# Patient Record
Sex: Female | Born: 1935 | Race: White | Hispanic: No | Smoking: Never smoker
Health system: Southern US, Community
[De-identification: ages and names within clinical notes are randomized; demographics above are authoritative.]

## PROBLEM LIST (undated history)

## (undated) DIAGNOSIS — I10 Essential (primary) hypertension: Secondary | ICD-10-CM

## (undated) DIAGNOSIS — H9190 Unspecified hearing loss, unspecified ear: Secondary | ICD-10-CM

## (undated) DIAGNOSIS — Z7901 Long term (current) use of anticoagulants: Secondary | ICD-10-CM

## (undated) DIAGNOSIS — I739 Peripheral vascular disease, unspecified: Secondary | ICD-10-CM

## (undated) DIAGNOSIS — I480 Paroxysmal atrial fibrillation: Secondary | ICD-10-CM

## (undated) DIAGNOSIS — I499 Cardiac arrhythmia, unspecified: Secondary | ICD-10-CM

## (undated) DIAGNOSIS — R42 Dizziness and giddiness: Secondary | ICD-10-CM

## (undated) DIAGNOSIS — I4891 Unspecified atrial fibrillation: Secondary | ICD-10-CM

## (undated) DIAGNOSIS — N189 Chronic kidney disease, unspecified: Secondary | ICD-10-CM

## (undated) DIAGNOSIS — K625 Hemorrhage of anus and rectum: Secondary | ICD-10-CM

## (undated) DIAGNOSIS — J45909 Unspecified asthma, uncomplicated: Secondary | ICD-10-CM

## (undated) DIAGNOSIS — I4819 Other persistent atrial fibrillation: Secondary | ICD-10-CM

## (undated) DIAGNOSIS — I440 Atrioventricular block, first degree: Secondary | ICD-10-CM

## (undated) DIAGNOSIS — E039 Hypothyroidism, unspecified: Secondary | ICD-10-CM

## (undated) DIAGNOSIS — M1990 Unspecified osteoarthritis, unspecified site: Secondary | ICD-10-CM

## (undated) DIAGNOSIS — K648 Other hemorrhoids: Secondary | ICD-10-CM

## (undated) DIAGNOSIS — E079 Disorder of thyroid, unspecified: Secondary | ICD-10-CM

## (undated) HISTORY — DX: Other hemorrhoids: K64.8

## (undated) HISTORY — PX: CYSTOCELE REPAIR: SHX163

## (undated) HISTORY — DX: Essential (primary) hypertension: I10

## (undated) HISTORY — DX: Hemorrhage of anus and rectum: K62.5

## (undated) HISTORY — PX: COLONOSCOPY: SHX174

## (undated) HISTORY — PX: HAND SURGERY: SHX662

## (undated) HISTORY — PX: TONSILLECTOMY: SUR1361

## (undated) HISTORY — DX: Chronic kidney disease, unspecified: N18.9

## (undated) HISTORY — PX: DILATION AND CURETTAGE OF UTERUS: SHX78

## (undated) HISTORY — DX: Paroxysmal atrial fibrillation: I48.0

## (undated) HISTORY — DX: Long term (current) use of anticoagulants: Z79.01

## (undated) HISTORY — DX: Disorder of thyroid, unspecified: E07.9

## (undated) HISTORY — DX: Atrioventricular block, first degree: I44.0

## (undated) HISTORY — DX: Other persistent atrial fibrillation: I48.19

## (undated) HISTORY — PX: JOINT REPLACEMENT: SHX530

## (undated) HISTORY — PX: DENTAL SURGERY: SHX609

## (undated) HISTORY — PX: APPENDECTOMY: SHX54

## (undated) HISTORY — DX: Unspecified atrial fibrillation: I48.91

## (undated) HISTORY — PX: ABDOMINAL HYSTERECTOMY: SHX81

## (undated) HISTORY — DX: Unspecified asthma, uncomplicated: J45.909

## (undated) HISTORY — DX: Dizziness and giddiness: R42

## (undated) HISTORY — PX: BACK SURGERY: SHX140

---

## 1999-07-16 ENCOUNTER — Encounter: Payer: Self-pay | Admitting: Obstetrics and Gynecology

## 1999-07-16 ENCOUNTER — Encounter: Admission: RE | Admit: 1999-07-16 | Discharge: 1999-07-16 | Payer: Self-pay | Admitting: Obstetrics and Gynecology

## 1999-10-09 ENCOUNTER — Other Ambulatory Visit: Admission: RE | Admit: 1999-10-09 | Discharge: 1999-10-09 | Payer: Self-pay | Admitting: Obstetrics and Gynecology

## 2000-07-21 ENCOUNTER — Encounter: Admission: RE | Admit: 2000-07-21 | Discharge: 2000-07-21 | Payer: Self-pay | Admitting: Family Medicine

## 2000-07-21 ENCOUNTER — Encounter: Payer: Self-pay | Admitting: Family Medicine

## 2000-11-08 ENCOUNTER — Other Ambulatory Visit: Admission: RE | Admit: 2000-11-08 | Discharge: 2000-11-08 | Payer: Self-pay | Admitting: Obstetrics and Gynecology

## 2000-12-14 ENCOUNTER — Encounter: Admission: RE | Admit: 2000-12-14 | Discharge: 2000-12-14 | Payer: Self-pay | Admitting: Obstetrics and Gynecology

## 2000-12-14 ENCOUNTER — Encounter: Payer: Self-pay | Admitting: Obstetrics and Gynecology

## 2001-03-02 ENCOUNTER — Ambulatory Visit (HOSPITAL_COMMUNITY): Admission: RE | Admit: 2001-03-02 | Discharge: 2001-03-02 | Payer: Self-pay | Admitting: Gastroenterology

## 2001-07-26 ENCOUNTER — Encounter: Admission: RE | Admit: 2001-07-26 | Discharge: 2001-07-26 | Payer: Self-pay | Admitting: Obstetrics and Gynecology

## 2001-07-26 ENCOUNTER — Encounter: Payer: Self-pay | Admitting: Obstetrics and Gynecology

## 2001-12-20 ENCOUNTER — Other Ambulatory Visit: Admission: RE | Admit: 2001-12-20 | Discharge: 2001-12-20 | Payer: Self-pay | Admitting: Obstetrics and Gynecology

## 2002-08-09 ENCOUNTER — Encounter: Admission: RE | Admit: 2002-08-09 | Discharge: 2002-08-09 | Payer: Self-pay | Admitting: Family Medicine

## 2002-08-09 ENCOUNTER — Encounter: Payer: Self-pay | Admitting: Family Medicine

## 2003-08-13 ENCOUNTER — Encounter: Admission: RE | Admit: 2003-08-13 | Discharge: 2003-08-13 | Payer: Self-pay | Admitting: Obstetrics and Gynecology

## 2003-10-01 ENCOUNTER — Ambulatory Visit: Admission: RE | Admit: 2003-10-01 | Discharge: 2003-10-01 | Payer: Self-pay | Admitting: Family Medicine

## 2004-02-05 ENCOUNTER — Ambulatory Visit (HOSPITAL_COMMUNITY): Admission: RE | Admit: 2004-02-05 | Discharge: 2004-02-05 | Payer: Self-pay | Admitting: Neurosurgery

## 2004-03-21 ENCOUNTER — Ambulatory Visit (HOSPITAL_COMMUNITY): Admission: RE | Admit: 2004-03-21 | Discharge: 2004-03-21 | Payer: Self-pay | Admitting: Neurosurgery

## 2004-04-29 ENCOUNTER — Inpatient Hospital Stay (HOSPITAL_COMMUNITY): Admission: RE | Admit: 2004-04-29 | Discharge: 2004-05-06 | Payer: Self-pay | Admitting: Neurosurgery

## 2004-05-06 ENCOUNTER — Inpatient Hospital Stay
Admission: RE | Admit: 2004-05-06 | Discharge: 2004-05-13 | Payer: Self-pay | Admitting: Physical Medicine & Rehabilitation

## 2004-05-06 ENCOUNTER — Ambulatory Visit: Payer: Self-pay | Admitting: Physical Medicine & Rehabilitation

## 2004-05-06 ENCOUNTER — Encounter (HOSPITAL_COMMUNITY)
Admission: RE | Admit: 2004-05-06 | Discharge: 2004-05-07 | Payer: Self-pay | Admitting: Physical Medicine & Rehabilitation

## 2004-05-08 ENCOUNTER — Ambulatory Visit (HOSPITAL_COMMUNITY)
Admission: RE | Admit: 2004-05-08 | Discharge: 2004-05-08 | Payer: Self-pay | Admitting: Physical Medicine & Rehabilitation

## 2004-07-07 ENCOUNTER — Ambulatory Visit (HOSPITAL_COMMUNITY): Admission: RE | Admit: 2004-07-07 | Discharge: 2004-07-07 | Payer: Self-pay | Admitting: Neurosurgery

## 2004-09-17 ENCOUNTER — Encounter: Admission: RE | Admit: 2004-09-17 | Discharge: 2004-09-17 | Payer: Self-pay | Admitting: Family Medicine

## 2005-11-03 ENCOUNTER — Encounter: Admission: RE | Admit: 2005-11-03 | Discharge: 2005-11-03 | Payer: Self-pay | Admitting: Family Medicine

## 2006-03-09 ENCOUNTER — Encounter: Admission: RE | Admit: 2006-03-09 | Discharge: 2006-03-09 | Payer: Self-pay | Admitting: Family Medicine

## 2006-11-05 ENCOUNTER — Encounter: Admission: RE | Admit: 2006-11-05 | Discharge: 2006-11-05 | Payer: Self-pay | Admitting: Family Medicine

## 2007-04-22 ENCOUNTER — Encounter: Admission: RE | Admit: 2007-04-22 | Discharge: 2007-04-22 | Payer: Self-pay | Admitting: Orthopedic Surgery

## 2007-04-26 ENCOUNTER — Ambulatory Visit (HOSPITAL_BASED_OUTPATIENT_CLINIC_OR_DEPARTMENT_OTHER): Admission: RE | Admit: 2007-04-26 | Discharge: 2007-04-27 | Payer: Self-pay | Admitting: Orthopedic Surgery

## 2007-10-11 ENCOUNTER — Ambulatory Visit: Payer: Self-pay | Admitting: Sports Medicine

## 2007-10-11 ENCOUNTER — Encounter (INDEPENDENT_AMBULATORY_CARE_PROVIDER_SITE_OTHER): Payer: Self-pay

## 2007-10-20 DIAGNOSIS — E669 Obesity, unspecified: Secondary | ICD-10-CM | POA: Insufficient documentation

## 2007-11-01 ENCOUNTER — Ambulatory Visit: Payer: Self-pay | Admitting: Family Medicine

## 2007-11-22 ENCOUNTER — Encounter: Admission: RE | Admit: 2007-11-22 | Discharge: 2007-11-22 | Payer: Self-pay | Admitting: Obstetrics and Gynecology

## 2008-11-23 ENCOUNTER — Encounter: Admission: RE | Admit: 2008-11-23 | Discharge: 2008-11-23 | Payer: Self-pay | Admitting: Obstetrics and Gynecology

## 2009-11-25 ENCOUNTER — Encounter: Admission: RE | Admit: 2009-11-25 | Discharge: 2009-11-25 | Payer: Self-pay | Admitting: Geriatric Medicine

## 2010-11-04 ENCOUNTER — Other Ambulatory Visit: Payer: Self-pay | Admitting: Geriatric Medicine

## 2010-11-04 DIAGNOSIS — Z1231 Encounter for screening mammogram for malignant neoplasm of breast: Secondary | ICD-10-CM

## 2010-11-28 ENCOUNTER — Ambulatory Visit
Admission: RE | Admit: 2010-11-28 | Discharge: 2010-11-28 | Disposition: A | Discharge status: Home / Self Care | Payer: PRIVATE HEALTH INSURANCE | Source: Ambulatory Visit | Attending: Geriatric Medicine | Admitting: Geriatric Medicine

## 2010-11-28 DIAGNOSIS — Z1231 Encounter for screening mammogram for malignant neoplasm of breast: Secondary | ICD-10-CM

## 2011-01-20 NOTE — Op Note (Signed)
NAMEJILDA, Frances [name]           ACCOUNT NO.:  [id number]   MEDICAL RECORD NO.:  [id number]          PATIENT TYPE:  AMB   LOCATION:  DSC                          FACILITY:  [facility]   PHYSICIAN:  Dionne Ano. 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   PROCEDURE:  1. Left thumb carpometacarpal joint arthroplasty (removal of the      trapezium at the basilar thumb joint level) left upper extremity.  2. Left thumb/wrist abductor pollicis longus, digastric portion tendon      transfer to the first metacarpal FCR (flexor carpi radialis) and      back upon itself (Zancolli tendon transfer) left wrist/basilar      thumb region.  3. Abductor pollicis longus one-third proper portion tendon transfer      to the flexor carpi radialis, abductor pollicis longus proper      portion and back upon themselves with multiple figure-of-eight      throws (Weilby tendon transfer) left wrist/basilar thumb region.  4. Abductor pollicis longus tenodesis (shortening of wrist extensor at      the wrist forearm level to prevent dorsolateral escape) left wrist.   SURGEON:  Dionne Ano. Amanda Pea, M.D.   ASSISTANT:  Karie Chimera.   COMPLICATIONS:  None.   ANESTHESIA:  General with preoperative block.   TOURNIQUET TIME:  Less than 2 hours.   ESTIMATED BLOOD LOSS:  Minimal.   INDICATIONS FOR PROCEDURE:  This patient is a very pleasant 77 year old  female who presents with above-mentioned diagnosis.  I have counseled  her in regards to risks and benefits of surgery including risk of  infection, bleeding, anesthesia, damage to normal  structures and failure  of surgery to accomplish its intended goals of relieving symptoms and  restoring function, with this mind, she desires to proceed.  All  questions have been encouraged answered preoperatively.   OPERATIVE PROCEDURE:  The patient seen by myself and anesthesia.  Preoperative antibiotics were given.  Arm was marked, permit signed and  block was given under the direction of Dr. Jean Rosenthal.  Following this she  was taken to the operative suite and underwent smooth induction of  general anesthesia.  She had slightly incomplete block and thus we  supplemented with a general anesthetic.  Following this the patient was  prepped and draped usual sterile fashion, Betadine scrub and paint about  the left upper extremity.  Once this was done, the patient then  underwent a thorough evaluation under anesthesia.  Preoperative  radiographs were checked and the patient had an incision made at the  dorsolateral aspect of the [facility] joint.  Dissection was carried down and  the superficial radial nerve in its branches were identified and  protected.  The radial artery was identified and protected.  I then  entered between the EPB and  APL region.  Capsule was split, Market researcher placed on either side of the trapezium and the trapezium  excision was accomplished followed by FCR tenolysis, tenosynovectomy and  creation of a drill hole dorsal to palmar exiting intra-articularly at  the palmar and BAK ligament.  This was enlarged to the 32 to 35 drill  bit size and exited intra-articularly. This completed the  arthroplasty/trapezium excision portion of the procedure.   Following this, I then performed irrigation and made a counter incision  to harvest the digastric portion of the APL and the one-third proper  portion of the APL.  These tendons were pretreated isolated and once  counterincision was made I dissected down, clipped the tendons  proximally at the musculotendinous junction and  allowed them to retract  distally.  The main portion of the APL was left intact of course.   Following this, I then performed a tendon transfer of the APL digastric  portion against the metacarpal through the drill hole dorsal to palmar  and around the FCR twice followed by routing it through itself at the  base of the metacarpal.  This was inset and secured with 3-0 FiberWire  and completed the Zancolli tendon transfer.   Following this second tendon transfer was accomplished utilizing a one-  third proper portion of the APL tendon.  This was weaved in a figure-of-  eight fashion between the APL proper and the FCR and inset with  FiberWire of a 3-0 variety completing the Weilby tendon transfer.  This  set the thumb in a nice position.   Following this, the patient underwent shortening of the APL proper to  prevent dorsolateral escape.  This was done with FiberWire and with  extensor shortening at the wrist level to prevent dorsolateral escape.   Following this, I then performed irrigation and closure the capsule.  This was imbricated with FiberWire.  Once this was done, I then  performed deflation of the tourniquet.  Irrigation was applied.  Hemostasis was obtained.  The wounds were closed with Prolene.  The  patient tolerated this well and no complicating features.  Following  this we continued postop measures in the recovery room consisting of IV  antibiotics, pain management according to her needs and will monitor  overnight with 23-hour observation.  I have discussed with the patient  these findings and the postop protocol and went over all issues with her  husband of course.  It was a pleasure to see her today.  At time first  postop visit will plan for suture removal and general postop protocol  status post [facility] arthroplasty with double tendon transfer.           ______________________________  Dionne Ano. Everlene Other, M.D.     Nash Mantis  D:  04/26/2007  T:  04/27/2007  Job:   [id number]

## 2011-01-23 NOTE — Op Note (Signed)
NAMEVERDA, MEHTA                     ACCOUNT NO.:  [id number]   MEDICAL RECORD NO.:  [id number]                   PATIENT TYPE:  INP   LOCATION:  3033                                 FACILITY:  [facility]   PHYSICIAN:  Danae Orleans. Venetia Maxon, M.D.               DATE OF BIRTH:  1934-01-17   DATE OF PROCEDURE:  DATE OF DISCHARGE:                                 OPERATIVE REPORT   DATE OF OPERATION:  April 29, 2004.   PREOPERATIVE DIAGNOSES:  Recurrent stenosis, spondylosis, degenerative disk  disease, scoliosis, and radiculopathy, L3-4, L4-5, and L5-6 levels.   POSTOPERATIVE DIAGNOSES:  Recurrent stenosis, spondylosis, degenerative disk  disease, scoliosis, and radiculopathy, L3-4, L4-5, and L5-6 levels.   PROCEDURE:  Redo compressive laminectomy, L3-4, L4-5, and L5-6 with  diskectomy and transverse lumbar interbody fusions, L3-4, L4-5, and L5-6  levels with pedicle screw fixation of L3, 4, 5 and 6 bilaterally with  posterolateral arthrodesis with bone morphogenic protein and auto and  allograft.   SURGEON:  Maeola Harman, MD.   ASSISTANT:  Barbaraann Barthel, MD.   ANESTHESIA:  General endotracheal anesthesia.   ESTIMATED BLOOD LOSS:  1000 mL with 650 mL of blood returned to the patient.   COMPLICATIONS:  None.   DISPOSITION:  Recovery.   INDICATIONS:  Prentiss Hammett is a 77 year old woman, who has had multiple  prior lumbar decompressive surgeries with recurrent, severe, left lower  extremity pain.  She had preoperative imaging including myelogram and post-  myelographic CT and also an MRI of her lumbar spine, which shows a  combination of degenerative disk disease, disk space collapse, a spondylitic  foraminal stenosis, and recurrent disk herniation along with scoliosis at  multiple levels.  It was elected to take her to surgery for decompression  and fusion at these affected levels.   PROCEDURE:  Ms. Hauswirth was brought to the operating room.  Following the  satisfactory  and uncomplicated induction of general endotracheal anesthesia  and the placement of intravenous lines, the patient was placed in a prone  position on the operating table on chest rolls.  Her back was then prepped  and draped in the usual sterile fashion.  Previous incision was reopened,  carried to the lumbodorsal fascia, which was incised with electrocautery.  Subperiosteal dissection was performed exposing the L3, L4, L5, and L6  transverse processes bilaterally, and this was confirmed on intraoperative  plain film x-ray.  Subsequently under loop magnification, the previously  operated levels were reexplored, scar tissue was very carefully dissected  using a variety of curettes, and the facet joint complexes of L4-5 and L5-6  were decompressed and lateral recess of the spinal canal was decompressed  from L3 to the L6 level bilaterally, with decompression of the spinal canal  and neural foramina.  The L5-6 level was entered on the left and using a  D'Errico nerve root retractor to retract the neural element, the interspace  was  incised with a 15 blade and disk material was removed in a piecemeal  fashion.  The end plates were prepared for placement of an interbody graft.  Subsequently, two levels higher were both decompressed.  There was a  significant amount of scar tissue on the left at the L4-5 level with  caudally-migrated fragments of disk material.  These were removed, and it  was not felt to be possible to retract the nerve root adequately on the left  side because of persistent scar tissue, so the right side exposure was  performed and instrumentation was placed from the right.  At the L3-4 level,  decompression was performed bilaterally.  At both of these levels, the disk  spaces were virtually completely collapsed.  Using a disk space spreader on  the opposite side, the end plates were distracted at L3-4 and L4-5 levels,  and the interspaces were decompressed using a variety of  disk space  preparation tools.  The end plates were stripped of residual disk material  down to bone, and cartilaginous tissue was removed down to bone.  Bone  morphogenic protein was then reconstituted, strips were cut, and at each  level a piece of BMP-soaked collagen sponge was inserted into the depth of  the interspace, and then at the L3-4 level a bullet-nosed peak interbody  cage was inserted and counter-sunk appropriately from the left side, and  then on the right side at the L4-5 level, a similarly sized cage was placed  and counter-sunk appropriately.  The outer edge of the cage cracked, but the  cage appeared to be very rigidly in place, and it was felt that this was not  a problem and that it retained its box-like structure and was therefore left  in position.  Morselized bone autograft was placed overlying these implants,  which had been packed with BMP-soaked collagen sponge.  A 10-mm implant was  then placed at the L5-6 level with BMP sponge at the depth of the disk  space, then the BMP sponge packed within the graft and morselized bone  autograft overlying the implant.  These positionings of implants were  performed under fluoroscopic visualization.  Subsequently, pedicle screw  fixation was placed at L3, L4, L5, and L6 bilaterally using 45 x 5.5-mm  screws at each level.  Positioning and trajectory of the screws were  confirmed on AP and lateral fluoroscopy, and a final x-ray demonstrated well-  positioned screws without cutouts, and there did not appear to be any  cutouts within the pedicle screw tracts.  The marrow-rich blood was  aspirated from the pedicle tracts and placed over the allograft substitute,  which was then laid over BMP sponge and placed over the decorticated  transverse processes, L3 through L6 bilaterally, and then remaining  morselized bone autograft was placed overlying this.  There was a small area of dura on the left side at L4-5 where arachnoid was  exposed, but there did  not appear to be any leak of CSF, and it was elected not to repair this.  Valsalva maneuver did not generate any egress of CSF.  The 100-mm rods were  then placed overlying the screw heads and locked into position with torque  wrench in situ.  The self-retaining retractor was removed.  The lumbodorsal  fascia was then closed with #1 Vicryl sutures, the subcutaneous tissues were  reapproximated with 2-0 Vicryl interrupted inverted sutures, and the skin  edges were reapproximated with interrupted 3-0 subcuticular stitch.  The  wound was dressed with Benzoin, Steri-Strips, Telfa gauze, and tape.  The  patient was extubated in the operating room and taken to the recovery room  in a stable, satisfactory condition, having tolerated the operation well.  Counts were correct at the end of the case.                                               Danae Orleans. Venetia Maxon, M.D.    JDS/MEDQ  D:  04/29/2004  T:  04/29/2004  Job:  [id number]

## 2011-01-23 NOTE — Discharge Summary (Signed)
NAMESANDRIA, [name]                     ACCOUNT NO.:  [id number]   MEDICAL RECORD NO.:  [id number]                   PATIENT TYPE:  ORB   LOCATION:  4525                                 FACILITY:  [facility]   PHYSICIAN:  Ranelle Oyster, M.D.             DATE OF BIRTH:  1934-03-07   DATE OF ADMISSION:  05/06/2004  DATE OF DISCHARGE:  05/13/2004                                 DISCHARGE SUMMARY   DISCHARGE DIAGNOSES:  1.  Redo decompressive lumbar laminectomy L3-L6.  2.  Urinary tract infection.  3.  Hypertension.  4.  Hypokalemia, resolved.   HISTORY OF PRESENT ILLNESS:  Frances [name] is a 77 year old female with  history of hypertension, DDD with prior lumbar laminectomy x2 now with  recurrent low back pain and left lower extremity weakness greater than right  lower extremity weakness with radiculopathy secondary to recurrent stenosis,  spondylosis, scoliosis L3-L6.  She elected to undergo redo decompressive  laminectomy L3-L4, L4-L5, L5-L6 with diskectomy and fusion by Dr. Venetia Maxon on  August 23.  Postoperative issues have included problems with BP control as  well as infiltration on left upper extremity, syncopal episode, and  postoperative fevers.  Fevers are currently noted to be resolved.  Edema  left upper extremity improving.  She does continue complaints regarding  constipation and hemorrhoidal flare-up.  Pain control is improving.  [facility] is  consulted for progressive goals.  This patient has mod assist with bed  mobility, min assist for transfers, guard assist 80 feet with rolling  walker.   PAST MEDICAL HISTORY:  1.  Hypertension.  2.  Lumbar laminectomy x2.  3.  Appendectomy.  4.  Hysterectomy.  5.  Cystocele repair.  6.  Right upper extremity DVT.  7.  Bronchitis.  8.  Seasonal allergies.  9.  DOE.  10. Cervical __________.  11. Hemorrhoids.  12. Nose bleeds.  13. End-stage and left thumb DJD.   ALLERGIES:  No known drug allergies.   SOCIAL HISTORY:   Patient is married.  Lives in two-level home with two steps  to entry.  Was independent and active prior to admission.  Does not use any  tobacco or alcohol.   HOSPITAL COURSE:  Frances [name] was admitted to [facility] on May 06, 2004 for  inpatient therapies to consist of PT/OT daily.  Last admission patient  reported problems with frequency, urgency and was noted to have positive UA.  She was started on amoxicillin.  Laboratories done past admission showed H&H  of 10.0/29.3, white count stable at 9.8.  Check ___________.  Sodium 140,  potassium 3.5, chloride 104, CO2 28, BUN 10, creatinine 0.8, glucose 110.  Patient reported problems with diarrhea secondary to amoxicillin.  This was  discontinued and once cultures finalized she was noted to have greater than  100,000 colonies of Citrobacter freundii in her urine.  She was treated with  Macrodantin four days.  Loose stools were sent for check  of C. difficile and  this was negative.  Patient's diarrhea resolved off of amoxicillin.  Patient's back incision was noted to be healing well without any signs or  symptoms of infection or drainage or erythema noted.  Blood pressure  reasonably controlled.  Patient continues with some weakness in left lower  extremity at time on discharge.  During her stay in [facility] patient made good  progress.  Currently, she is at supervision level for bed mobility,  supervision level for transfers, supervision level ambulating greater than  10 feet with rolling walker, able to navigate stairs with min assist.  Progress was limited in part secondary to her dependence on her husband for  assistance with functional activities.  Currently, she is set up for upper  body care, total assist for donning brace, mod assist for low body care.  Further follow-up therapies to include home health, PT/OT by [facility] on May 13, 2004.  Patient is discharged to home.   DISCHARGE MEDICATIONS:  1.  Coated  aspirin 81 mg daily.  2.  Calcium 500 mg daily.  3.  Premarin 0.3 mg daily.  4.  Macrodantin 50 mg q.i.d.  5.  Altace 10 mg daily.  6.  Oxycodone IR 5-10 mg q.4-6h. p.r.n. pain.  7.  Hydrochlorothiazide 25 mg daily.  8.  Tenormin 25 mg q.p.m.   ACTIVITY:  Use corset, back precautions.   WOUND CARE:  Keep the area dry and clean.   SPECIAL INSTRUCTIONS:  No alcohol.  No smoking.  No driving.   FOLLOWUP:  Patient to follow up with Dr. Venetia Maxon for postoperative check.  Follow up with Dr. Andrey Campanile for routine check.  Follow up with Dr. Riley Kill as  needed.      Greg Cutter, P.A.                    Ranelle Oyster, M.D.    PP/MEDQ  D:  05/13/2004  T:  05/14/2004  Job:  [id number]   cc:   Delsa Grana. Andrey Campanile, M.D.  [address]  [place]  [place] [address]  Fax: [phone]   Danae Orleans. Venetia Maxon, M.D.  [address]  [place]  [place] [address]  Fax: [phone]

## 2011-01-23 NOTE — Procedures (Signed)
[facility]  Patient:    Frances [name], Frances [name]                     MRN: [id number]. Date: 03/02/01 Attending:  Anselmo Rod, M.D. CC:         Cordelia Pen A. Rosalio Macadamia, M.D.   Procedure Report  DATE OF BIRTH:  04/30/1934  REFERRING PHYSICIAN:  Cordelia Pen A. Rosalio Macadamia, M.D.  PROCEDURE PERFORMED:  Colonoscopy.  ENDOSCOPIST:  Anselmo Rod, M.D.  INSTRUMENT USED:  Olympus video colonoscope.  INDICATIONS FOR PROCEDURE:  Rectal bleeding in a 77 year old white female rule out colonic polyps, masses, hemorrhoids, etc.  PREPROCEDURE PREPARATION:  Informed consent was procured from the patient. The patient was fasted for eight hours prior to the procedure and prepped with a bottle of magnesium citrate and a gallon of NuLytely the night prior to the procedure.  PREPROCEDURE PHYSICAL:  The patient had stable vital signs.  Neck supple. Chest clear to auscultation.  S1, S2 regular.  Abdomen soft with normal abdominal bowel sounds.  DESCRIPTION OF PROCEDURE:  The patient was placed in the left lateral decubitus position and sedated with 70 mg of Demerol and 7 mg of Versed intravenously.  Once the patient was adequately sedated and maintained on low-flow oxygen and continuous cardiac monitoring, the Olympus video colonoscope was advanced from the rectum to the cecum without difficulty. Except for small nonbleeding internal and external hemorrhoids, no other abnormalities were seen.  IMPRESSION:  Healthy-appearing colon except for small nonbleeding internal and external hemorrhoids.  RECOMMENDATIONS: 1. A high fiber diet has been recommended. 2. She is to return to the office if she has any recurrence of her symptoms    or worsening of her present condition.DD:  03/02/01 TD:  03/02/01 Job: [id number]

## 2011-01-23 NOTE — H&P (Signed)
[place]  Patient:    Frances [name]                    MRN: [id number] Proc. Date: 12/08/96 Adm. Date:  12/08/96 Attending:  Josie Saunders                         History and Physical  REASON FOR ADMISSION:  Herniated lumbar disk.  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 Andrey Campanile with the chief complaint of left leg pain, initially on 08/07/96.  She states that this developed after she was chopping down a brush behind her sons house in September. Following this she developed left calf pain and feels tightness across her foot on the left. She initially had some low back pain but at the time of initial consultation denied any significant low back pain.  She feels that her pain is concentrated in her left hip.  She states that she is weak in her left foot and both she and her husband have noted that when she walks she slaps her foot with walking.  She denies any right lower extremity pain.  Frances [name] has had one prior low back surgery which was performed by Jonny Ruiz L. Rendall, III, M.D. in 1984 and consisted of a total laminectomy at L3 based on review of her x-rays.  She states a disk was removed at that time as well.  She states that she had had left leg pain and low back pain then and that this got better after surgery.  Frances [name] had been taking Percocet, Valium, and prednisone taper x 2.  She states that the prednisone helped while she was taking it but since then has not helped her.  She has been taking Aleve but states she has not had a great deal of relief with this pain medication.  PAST MEDICAL HISTORY:  Significant for cystocele, prior back surgery in 1984, tonsillectomy in 1948, appendectomy in 1952, dental surgery in 1969 and 1970, D & C x 3, hysterectomy in 1977.  ALLERGIES:  Allergic to sulfa and  codeine.  SOCIAL HISTORY:  She is a nonsmoker, nondrinker.  She is 5 feet 4 inches tall, 148 pounds.  CURRENT MEDICATIONS: 1.  Premarin 0.625 mg q.d. 2.  Percocet up to t.i.d. p.r.n. pain. 3.  Valium p.r.n. spasms.  FAMILY HISTORY:  Her mother died at age 62 of heart attack, father died at age 16 of diabetes and stroke.  There is a family history of heart attack, diabetes, high blood pressure, and lumbar disk disease.  DIAGNOSTIC STUDIES:  Frances [name] presents with a lumbar MRI which showed six lumbar vertebral bodies, prior decompression at L3-4 without evidence of central canal or lateral recess stenosis.  There is also degenerative disc disease at L4-5 with left-sided disk protrusion.  This was felt on interpretation to represent some epidural fibrosis in the left lateral recess just behind the superior end plate of L5 which exerts mass effect on the vertebral sack.   Of note, this is a previously unoperated level.  There is also central to slightly left-sided shallow disk herniation at the L5-L6 level which showed noncompressed left-sided nerve root.  PHYSICAL EXAMINATION:  Frances [name] is a pleasant cooperative middle-aged woman in moderate discomfort.  HEENT:  No masses.  NECK:  No carotid bruits.  LUNGS:  Clear  to auscultation.  HEART:  Regular rate and rhythm without murmur.  BREASTS:  She had a recent mammogram in September and a breast examination by her family physician.  ABDOMEN:  Soft, nontender, normal active bowel sounds.  No hepatosplenomegaly appreciated.  EXTREMITIES:  She has some swelling in her right arm and had noted some discoloration on her right arm over a week prior to seeing me.  She states this has been gradually improving.  She has not noted any swelling in her left arm.  She is not having any pain or weakness in her arm.  No edema, clubbing or cyanosis.  Intact pedal pulses.  NEUROLOGIC:  Frances [name] walks an ataxic gait favoring her  left lower extremity.  She is able to stand on her toes but is not able to stand on her heel on the left.  She has left sciatic discomfort to palpation and does not have significant midline back pain to palpation.  She is able to bend to within five inches of the floor.  Her lower extremity strength is full in all motor groups bilaterally and symmetrically with the exception of left dorsiflexion which is 4/5 and left extensor hallus longus at 4-/5. Reflexes are 2 at the knees, 2 at the right ankle, 1 at the left ankle.  Great toes are downgoing to flexor stimulation.  She notes that Dr. Priscille Kluver told her before surgery that she had decreased left ankle jerk and does not feel this has changed.  On sensory examination, she has hypesthesia in L5 and S1 distribution on the left.  Straight leg raising is positive on the left with a positive  ______ Spurling sign, minimally positive Patricks test.  Straight leg raising negative on the right.  ADMISSION IMPRESSION:  Frances [name] has evidence of L5 radiculopathy with six lumbar vertebra with evidence on admission studies of L4-5 disk herniation on the left which I think is compressing the left L5 nerve root.  She has positive sciatica discomfort, positive straight leg raising, weakness in dorsiflexion and extensor hallucis longus, and numbness in her left foot.  She has not had any relief despite medications and waiting several months.  The patient subsequently underwent work up of her right arm swelling and this included a venous Doppler of the right upper extremity which showed an extensive deep venous thrombosis in the right upper extremity.  She was admitted to [facility] and started on heparin and was then started on Coumadin.  She was felt by Dr. Edilia Bo to need at least two months of Coumadin. Because of that, her initially planned surgery was cancelled.  She subsequently returned in January still complaining of left  leg pain and weakness along with weakness in dorsiflexion and extensor hallucis longus.  She has been limiting her activities a great  deal while on Coumadin.  Her leg strength is marginally improved. She was noted at that time to have some pitting edema just below her knee from the left and to her mid calf on the right.  She was advised to see Dr. Andrey Campanile for this to have this evaluated.  She subsequently returned on 10/31/96 with swelling in her legs that had improved.  She was placed on a no-added salt diet.  She had a Doppler of her upper extremity and lower extremity and these were both improved.  Her leg pain was somewhat improved at that time but again she states that she is doing no activities and she continues to have significant dorsiflexion  and extensor hallucis longus weakness on the left.  She continues to slap her foot while she walks.  She subsequently returned to see me on 11/27/96.  She was off her Coumadin and continued to have significant weakness in an L5 distribution on the left.  I reviewed her MRI with her which showed the suggestion of a free fragment disk herniation at the L4-5 level.  Of note, she has lumbar vertebra and what appears to be a shallow disk herniation at the L5-6 level which does not appear to be compressing a nerve root.  She gets pain with any activity and also continued weakness of the left leg.  Because of this, it was planned to go ahead with surgery.  Surgery is scheduled for L4-5 microdiskectomy, removal of possible free fragments, and decompression of the nerve root and foraminotomy on the left.  The patient is aware of the potential risks of surgery and wishes to proceed.   PLAN: DD:  12/08/96 TD:  12/09/96 Job: [id number]

## 2011-01-23 NOTE — Discharge Summary (Signed)
[name], [name]           ACCOUNT NO.:  [id number]   MEDICAL RECORD NO.:  [id number]          PATIENT TYPE:  INP   LOCATION:  3033                         FACILITY:  [facility]   PHYSICIAN:  Danae Orleans. Venetia Maxon, M.D.  DATE OF BIRTH:  [birth year]   DATE OF ADMISSION:  04/29/2004  DATE OF DISCHARGE:  05/06/2004                                 DISCHARGE SUMMARY   REASON FOR ADMISSION:  1.  Lumbosacral spondylosis.  2.  Lumbar disk herniation.  3.  Lumbar scoliosis.   ADDITIONAL DIAGNOSES:  1.  Urinary retention.  2.  Hypertension.   FINAL DIAGNOSES:  1.  Lumbosacral spondylosis.  2.  Lumbar disk herniation.  3.  Lumbar scoliosis.  4.  Urinary retention.  5.  Hypertension.   HISTORY OF ILLNESS AND HOSPITAL COURSE:  Frances [name] is a 76 year old  woman with hypertension and lumbar scoliosis and recurrent stenosis who  previously had undergone decompressive lumbar laminectomy for spinal  stenosis who developed recurrent stenosis and scoliotic curvature with  significant nerve root compression at multiple levels in the lumbar spine.  It was elected to admit her to the hospital on a same day's procedure basis  and for her to undergo redo disk decompression diskectomy and fusion, L3  through L6 levels. Postoperatively, she had some burning dysesthesias in her  left leg which gradually resolved. She had a temperature to 103 on the  second postoperative day and was required to engage in deep breathing using  an incentive spirometry. Electrolytes were checked. The patient had a  syncopal episode later that same day. An IV was infiltrated, and this was  discontinued. The patient was gradually mobilized. She had urinary retention  on August 25 and required indwelling Foley catheter for that. She was  gradually immobilized on August 29. Foley catheter was discontinued. She was  seen by the inpatient rehabilitation service and was felt at that point that  it would be a good idea for her  to go for inpatient rehabilitation following  her lumbar decompression and fusion. Instructions were to follow up with Dr.  Venetia Maxon in the office in 3 week with lumbar spine AP and lateral radiographs.      Jose   JDS/MEDQ  D:  06/12/2004  T:  06/12/2004  Job:  [id number]

## 2011-04-07 ENCOUNTER — Encounter (INDEPENDENT_AMBULATORY_CARE_PROVIDER_SITE_OTHER): Payer: Self-pay | Admitting: General Surgery

## 2011-04-08 HISTORY — PX: OTHER SURGICAL HISTORY: SHX169

## 2011-04-13 ENCOUNTER — Encounter (INDEPENDENT_AMBULATORY_CARE_PROVIDER_SITE_OTHER): Payer: Self-pay | Admitting: General Surgery

## 2011-04-13 ENCOUNTER — Ambulatory Visit (INDEPENDENT_AMBULATORY_CARE_PROVIDER_SITE_OTHER): Payer: Medicare Other | Admitting: General Surgery

## 2011-04-13 VITALS — BP 150/80 | HR 64 | Temp 97.3°F | Ht 65.0 in | Wt 173.6 lb

## 2011-04-13 DIAGNOSIS — K648 Other hemorrhoids: Secondary | ICD-10-CM

## 2011-04-13 NOTE — Progress Notes (Signed)
Subjective:     Patient ID: Frances [name], female   DOB: 22-Jun-1934, 77 y.o.   MRN: [id number]  HPI This is a pleasant 77 year old Caucasian female, centimeters to Dr. Carman Ching for evaluation of bleeding hemorrhoids.  The patient has had problems with intermittent bleeding from hemorrhoids for about a year. This is painless. She is placed on Coumadin about a year ago for chronic atrial fibrillation and bleeding episodes became more frequent. She saw Dr. Randa Evens for a colonoscopy which  was negative except for the internal hemorrhoids. He placed her on MiraLax and fiber supplements to keep her stools soft and she actually is having less frequent episodes of bleeding now. Again there is no pain.  She had hemorrhoid banding by Dr. Kendrick Ranch many years ago but otherwise no rectal surgery  Past Medical History  Diagnosis Date  . Hypertension   . Thyroid disease     hypothyroidism  . Atrial fibrillation   . Chronic kidney disease     stage 3 renal disease  . Internal hemorrhoid   . Rectal bleeding    Current Outpatient Prescriptions  Medication Sig Dispense Refill  . Calcium Carbonate-Vitamin D (CALCIUM-VITAMIN D) 500-200 MG-UNIT per tablet Take 1 tablet by mouth 2 (two) times daily with a meal.        . diltiazem (CARDIZEM CD) 120 MG 24 hr capsule Take 120 mg by mouth daily.        Marland Kitchen estradiol (VIVELLE-DOT) 0.05 MG/24HR Place 1 patch onto the skin once a week.        . fish oil-omega-3 fatty acids 1000 MG capsule Take 2 g by mouth daily.        . flecainide (TAMBOCOR) 100 MG tablet Take 100 mg by mouth 2 (two) times daily.        . hydrochlorothiazide 25 MG tablet Take 25 mg by mouth daily.        Marland Kitchen levothyroxine (SYNTHROID, LEVOTHROID) 75 MCG tablet Take 75 mcg by mouth daily.        . Multiple Vitamin (MULTIVITAMIN) tablet Take 1 tablet by mouth daily.        . Probiotic Product (PROBIOTIC PO) Take by mouth daily.        . ramipril (ALTACE) 10 MG tablet Take 10 mg by mouth  daily.        . vitamin C (ASCORBIC ACID) 500 MG tablet Take 500 mg by mouth daily.        Marland Kitchen warfarin (COUMADIN) 3 MG tablet Take 3 mg by mouth daily.         Allergies  Allergen Reactions  . Codeine Nausea And Vomiting  . Keflex Anxiety  . Sudafed (Pseudoephedrine Hcl) Anxiety  . Sulfa Antibiotics Rash    Family History  Problem Relation Age of Onset  . Heart disease Mother   . Hypertension Father   . Diabetes Father   . Heart disease Sister   . Hypertension Sister   . Heart disease Brother     History  Substance Use Topics  . Smoking status: Never Smoker   . Smokeless tobacco: Not on file  . Alcohol Use: No   . Review of Systems  Constitutional: Negative.   HENT: Negative.   Eyes: Negative.   Respiratory: Negative.   Cardiovascular: Positive for palpitations. Negative for chest pain and leg swelling.  Gastrointestinal: Positive for anal bleeding. Negative for nausea, vomiting, abdominal pain, diarrhea, constipation, blood in stool, abdominal distention and rectal pain.  Genitourinary:  Negative.   Skin: Negative.   Neurological: Negative.   Hematological: Negative.   Psychiatric/Behavioral: Negative.        Objective:   Physical Exam  Constitutional: She appears well-developed and well-nourished. No distress.  HENT:  Head: Normocephalic and atraumatic.  Eyes: Conjunctivae are normal. Pupils are equal, round, and reactive to light. No scleral icterus.  Neck: Normal range of motion. Neck supple. No JVD present. No tracheal deviation present. No thyromegaly present.  Cardiovascular: Normal rate and normal heart sounds.  Exam reveals no gallop.   No murmur heard.      Irreg. irreg.  Pulmonary/Chest: Effort normal and breath sounds normal. No respiratory distress. She has no wheezes. She has no rales. She exhibits no tenderness.  Abdominal: Bowel sounds are normal. She exhibits no distension and no mass. There is no tenderness. There is no rebound and no guarding.    Genitourinary:     Musculoskeletal: She exhibits no edema and no tenderness.  Lymphadenopathy:    She has no cervical adenopathy.  Neurological: She exhibits normal muscle tone. Coordination normal.  Skin: Skin is warm and dry. No rash noted. She is not diaphoretic. No erythema. No pallor.  Psychiatric: She has a normal mood and affect. Her behavior is normal. Judgment and thought content normal.       Assessment:     Bleeding internal hemorrhoids, circumferential.  Chronic atrial fibrillation on Coumadin.  Hypertension.  Hypothyroidism.  Status post multiple back surgeries.  Status post hysterectomy and appendectomy.    Plan:     Internal hemorrhoids were injected with sclerosing solution right anterior, right posterior, and left lateral in the office today. She tolerated this very well.  Hydration, high fiber low fat diet encouraged.  Return to see me if there are any further symptoms.

## 2011-04-13 NOTE — Patient Instructions (Signed)
You had been bleeding from internal hemorrhoids. This has been made a little bit worse because you are on Coumadin. Today in my office we injected sclerosing solution and that should control the bleeding. If it does not control the bleeding after 2-3 weeks you should return to see me for a reevaluation. Stay well hydrated. Eat 7-8 servings of fruits and vegetables per day. Take supplemental fiber as necessary.

## 2011-04-13 NOTE — Progress Notes (Deleted)
Subjective:     Patient ID: Frances [name], female   DOB: 04-16-1934, 77 y.o.   MRN: [id number]  HPI Patient underwent a lap-assisted right colectomy on March 05, 2011. She did well from an abdominal standpoint having an ileus for a couple of days. She developed renal insufficiency, was seen by nephrology, urology and also cardiology. She had a Foley catheter placed because she had a bladder outlet obstruction and that returned her renal function is normal. She still has a Foley catheter and is being followed by Dr. Heloise Purpura for her bladder outlet instruction as well as her right renal mass.  In terms of her bowel surgery she understands that she had an adenocarcinoma of the right colon was 0/15 lymph nodes positive. This was a stage TIII, N0 adenocarcinoma. She has not yet been referred to medical oncology.  Her appetite is normal. Her bowel function is normal. She has no bowel pain.  Review of Systems     Objective:   Physical Exam Patient looks well. Her husband is with her.  Weight is 204.8 pounds.  Lungs are clear to auscultation.  Abdomen soft and nontender. All the incisions are well-healed. No mass no distention. Foley catheter in place.    Assessment:     Adenocarcinoma of the acscending colon, pathologic stage TIII, N0. Recovering without any major intra-abdominal complications following laparoscopic-assisted right colectomy.  Acute renal insufficiency secondary to bladder with obstruction, now resolved with indwelling Foley.  Right renal mass, followed by Dr. Laverle Patter.  Hypertension.  Right ovarian cyst of low malignant potential.   Plan:     Keep an appointment with Dr. Laverle Patter regarding Foley catheter removal.  Referred to medical oncology for consultation.  Diet and activities discussed. She was encouraged to increase activity.  Return to see me in 6 weeks.

## 2011-05-06 ENCOUNTER — Telehealth (INDEPENDENT_AMBULATORY_CARE_PROVIDER_SITE_OTHER): Payer: Self-pay

## 2011-05-06 NOTE — Telephone Encounter (Signed)
Pt called and lmovm [id number] having slight pink to red at times bleeding from rectum. She stated this had improved from early August appt injection therapy but wanted to know if she needed to come in now or wait to see if improving. Pt to call back to make appt.

## 2011-06-15 ENCOUNTER — Encounter (INDEPENDENT_AMBULATORY_CARE_PROVIDER_SITE_OTHER): Payer: Self-pay | Admitting: General Surgery

## 2011-06-15 ENCOUNTER — Ambulatory Visit (INDEPENDENT_AMBULATORY_CARE_PROVIDER_SITE_OTHER): Payer: Medicare Other | Admitting: General Surgery

## 2011-06-15 VITALS — BP 132/86 | HR 72 | Temp 98.2°F | Resp 16 | Ht 65.0 in | Wt 173.0 lb

## 2011-06-15 DIAGNOSIS — K648 Other hemorrhoids: Secondary | ICD-10-CM

## 2011-06-15 NOTE — Patient Instructions (Signed)
We injected sclerosing solution into 3 areas of your internal hemorrhoids today. Hopefully this will take care of the problem. You also have a dermatitis of the perianal skin called pruritus ani.. This is due to moisture. We will give you an information booklet that should help resolve this problem. Return to see me if there are any further problems.

## 2011-06-15 NOTE — Progress Notes (Signed)
Subjective:     Patient ID: Frances [name], female   DOB: [birth year], 77 y.o.   MRN: [id number]  HPI This is a very pleasant 77 year old Caucasian female on Coumadin for atrial fibrillation. She returns in followup for management bleeding internal hemorrhoids.  On August 6 I injected the circumferential internal hemorrhoids. She says that the bleeding is 90% better but she still sees some blood and wanted to be checked. She doesn't have any pain. She has some external hemorrhoids but they are basically asymptomatic.  Past Medical History  Diagnosis Date  . Hypertension   . Thyroid disease     hypothyroidism  . Atrial fibrillation   . Chronic kidney disease     stage 3 renal disease  . Internal hemorrhoid   . Rectal bleeding   . Asthmatic bronchitis    Current Outpatient Prescriptions  Medication Sig Dispense Refill  . Calcium Carbonate-Vitamin D (CALCIUM-VITAMIN D) 500-200 MG-UNIT per tablet Take 1 tablet by mouth 2 (two) times daily with a meal.        . diltiazem (CARDIZEM CD) 120 MG 24 hr capsule Take 120 mg by mouth daily.        Marland Kitchen estradiol (VIVELLE-DOT) 0.05 MG/24HR Place 1 patch onto the skin once a week.        . fish oil-omega-3 fatty acids 1000 MG capsule Take 2 g by mouth daily.        . flecainide (TAMBOCOR) 100 MG tablet Take 100 mg by mouth 2 (two) times daily.        . hydrochlorothiazide 25 MG tablet Take 25 mg by mouth daily.        Marland Kitchen levothyroxine (SYNTHROID, LEVOTHROID) 75 MCG tablet Take 75 mcg by mouth daily.        . Multiple Vitamin (MULTIVITAMIN) tablet Take 1 tablet by mouth daily.        . Probiotic Product (PROBIOTIC PO) Take by mouth daily.        . Psyllium (METAMUCIL PO) Take by mouth daily.        . ramipril (ALTACE) 10 MG tablet Take 10 mg by mouth daily.        . vitamin C (ASCORBIC ACID) 500 MG tablet Take 500 mg by mouth daily.        Marland Kitchen warfarin (COUMADIN) 3 MG tablet Take 3 mg by mouth daily.          Allergies  Allergen Reactions  .  Codeine Nausea And Vomiting  . Keflex Anxiety  . Sudafed (Pseudoephedrine Hcl) Anxiety  . Sulfa Antibiotics Rash     Review of Systems     Objective:   Physical Exam Gen. the patient is in good spirits and in no distress.  Rectal she has some small external hemorrhoids which are not inflamed and nontender. 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 Digital rectal exam reveals normal sphincter, not really tender no blood. Anoscopy reveals no blood. She does still have some internal hemorrhoids. I injected with sclerosing solution and right anterior, right posterior, and left lateral patient tolerated this very well without any bleeding.    Assessment:      Bleeding internal hemorrhoids, reinjected today. She has had a good result from her initial injection.  A symptomatic external hemorrhoids  Pruritis ani     .Chronic Coumadin therapy.    Plan:     She we discussed management of internal hemorrhoids as well as external  hemorrhoids.  We also discussed the  management of pruritus ani now. She was given patient information booklet.  Return to see me p.r.n.

## 2011-06-19 LAB — BASIC METABOLIC PANEL
CO2: 28
Creatinine, Ser: 0.81
GFR calc Af Amer: >60
GFR calc non Af Amer: >60
Glucose, Bld: 87
Potassium: 4.4
Sodium: 138

## 2011-06-19 LAB — POCT HEMOGLOBIN-HEMACUE: Operator id: 112821

## 2011-09-16 DIAGNOSIS — H9209 Otalgia, unspecified ear: Secondary | ICD-10-CM | POA: Diagnosis not present

## 2011-09-16 DIAGNOSIS — I4891 Unspecified atrial fibrillation: Secondary | ICD-10-CM | POA: Diagnosis not present

## 2011-10-07 DIAGNOSIS — I4891 Unspecified atrial fibrillation: Secondary | ICD-10-CM | POA: Diagnosis not present

## 2011-10-07 DIAGNOSIS — Z7901 Long term (current) use of anticoagulants: Secondary | ICD-10-CM | POA: Diagnosis not present

## 2011-10-21 ENCOUNTER — Other Ambulatory Visit: Payer: Self-pay | Admitting: Geriatric Medicine

## 2011-10-21 DIAGNOSIS — Z1231 Encounter for screening mammogram for malignant neoplasm of breast: Secondary | ICD-10-CM

## 2011-10-21 DIAGNOSIS — I4891 Unspecified atrial fibrillation: Secondary | ICD-10-CM | POA: Diagnosis not present

## 2011-10-21 DIAGNOSIS — Z7901 Long term (current) use of anticoagulants: Secondary | ICD-10-CM | POA: Diagnosis not present

## 2011-11-10 DIAGNOSIS — I4891 Unspecified atrial fibrillation: Secondary | ICD-10-CM | POA: Diagnosis not present

## 2011-11-24 DIAGNOSIS — N951 Menopausal and female climacteric states: Secondary | ICD-10-CM | POA: Diagnosis not present

## 2011-11-24 DIAGNOSIS — Z01419 Encounter for gynecological examination (general) (routine) without abnormal findings: Secondary | ICD-10-CM | POA: Diagnosis not present

## 2011-11-24 DIAGNOSIS — Z124 Encounter for screening for malignant neoplasm of cervix: Secondary | ICD-10-CM | POA: Diagnosis not present

## 2011-11-25 DIAGNOSIS — I4891 Unspecified atrial fibrillation: Secondary | ICD-10-CM | POA: Diagnosis not present

## 2011-11-30 ENCOUNTER — Ambulatory Visit: Payer: Medicare Other

## 2011-12-02 DIAGNOSIS — I10 Essential (primary) hypertension: Secondary | ICD-10-CM | POA: Diagnosis not present

## 2011-12-02 DIAGNOSIS — I4891 Unspecified atrial fibrillation: Secondary | ICD-10-CM | POA: Diagnosis not present

## 2011-12-03 ENCOUNTER — Ambulatory Visit
Admission: RE | Admit: 2011-12-03 | Discharge: 2011-12-03 | Disposition: A | Discharge status: Home / Self Care | Payer: Medicare Other | Source: Ambulatory Visit | Attending: Geriatric Medicine | Admitting: Geriatric Medicine

## 2011-12-03 DIAGNOSIS — Z1231 Encounter for screening mammogram for malignant neoplasm of breast: Secondary | ICD-10-CM

## 2011-12-07 DIAGNOSIS — I129 Hypertensive chronic kidney disease with stage 1 through stage 4 chronic kidney disease, or unspecified chronic kidney disease: Secondary | ICD-10-CM | POA: Diagnosis not present

## 2011-12-07 DIAGNOSIS — N183 Chronic kidney disease, stage 3 unspecified: Secondary | ICD-10-CM | POA: Diagnosis not present

## 2011-12-07 DIAGNOSIS — Z79899 Other long term (current) drug therapy: Secondary | ICD-10-CM | POA: Diagnosis not present

## 2011-12-07 DIAGNOSIS — E039 Hypothyroidism, unspecified: Secondary | ICD-10-CM | POA: Diagnosis not present

## 2011-12-23 DIAGNOSIS — Z79899 Other long term (current) drug therapy: Secondary | ICD-10-CM | POA: Diagnosis not present

## 2011-12-23 DIAGNOSIS — N183 Chronic kidney disease, stage 3 unspecified: Secondary | ICD-10-CM | POA: Diagnosis not present

## 2011-12-23 DIAGNOSIS — I4891 Unspecified atrial fibrillation: Secondary | ICD-10-CM | POA: Diagnosis not present

## 2011-12-23 DIAGNOSIS — I129 Hypertensive chronic kidney disease with stage 1 through stage 4 chronic kidney disease, or unspecified chronic kidney disease: Secondary | ICD-10-CM | POA: Diagnosis not present

## 2011-12-23 DIAGNOSIS — E039 Hypothyroidism, unspecified: Secondary | ICD-10-CM | POA: Diagnosis not present

## 2012-01-20 DIAGNOSIS — M899 Disorder of bone, unspecified: Secondary | ICD-10-CM | POA: Diagnosis not present

## 2012-01-20 DIAGNOSIS — I4891 Unspecified atrial fibrillation: Secondary | ICD-10-CM | POA: Diagnosis not present

## 2012-01-20 DIAGNOSIS — M949 Disorder of cartilage, unspecified: Secondary | ICD-10-CM | POA: Diagnosis not present

## 2012-01-26 DIAGNOSIS — L820 Inflamed seborrheic keratosis: Secondary | ICD-10-CM | POA: Diagnosis not present

## 2012-01-26 DIAGNOSIS — L570 Actinic keratosis: Secondary | ICD-10-CM | POA: Diagnosis not present

## 2012-02-03 DIAGNOSIS — I4891 Unspecified atrial fibrillation: Secondary | ICD-10-CM | POA: Diagnosis not present

## 2012-02-17 DIAGNOSIS — I4891 Unspecified atrial fibrillation: Secondary | ICD-10-CM | POA: Diagnosis not present

## 2012-02-22 DIAGNOSIS — H6980 Other specified disorders of Eustachian tube, unspecified ear: Secondary | ICD-10-CM | POA: Diagnosis not present

## 2012-02-22 DIAGNOSIS — H66009 Acute suppurative otitis media without spontaneous rupture of ear drum, unspecified ear: Secondary | ICD-10-CM | POA: Diagnosis not present

## 2012-03-16 DIAGNOSIS — I4891 Unspecified atrial fibrillation: Secondary | ICD-10-CM | POA: Diagnosis not present

## 2012-04-13 DIAGNOSIS — I4891 Unspecified atrial fibrillation: Secondary | ICD-10-CM | POA: Diagnosis not present

## 2012-05-10 DIAGNOSIS — I4891 Unspecified atrial fibrillation: Secondary | ICD-10-CM | POA: Diagnosis not present

## 2012-05-11 DIAGNOSIS — H04129 Dry eye syndrome of unspecified lacrimal gland: Secondary | ICD-10-CM | POA: Diagnosis not present

## 2012-05-16 DIAGNOSIS — Z23 Encounter for immunization: Secondary | ICD-10-CM | POA: Diagnosis not present

## 2012-05-30 DIAGNOSIS — H905 Unspecified sensorineural hearing loss: Secondary | ICD-10-CM | POA: Diagnosis not present

## 2012-05-30 DIAGNOSIS — H6980 Other specified disorders of Eustachian tube, unspecified ear: Secondary | ICD-10-CM | POA: Diagnosis not present

## 2012-05-31 DIAGNOSIS — I4891 Unspecified atrial fibrillation: Secondary | ICD-10-CM | POA: Diagnosis not present

## 2012-06-03 DIAGNOSIS — I4891 Unspecified atrial fibrillation: Secondary | ICD-10-CM | POA: Diagnosis not present

## 2012-06-03 DIAGNOSIS — Z7901 Long term (current) use of anticoagulants: Secondary | ICD-10-CM | POA: Diagnosis not present

## 2012-06-03 DIAGNOSIS — R42 Dizziness and giddiness: Secondary | ICD-10-CM | POA: Diagnosis not present

## 2012-06-03 DIAGNOSIS — I10 Essential (primary) hypertension: Secondary | ICD-10-CM | POA: Diagnosis not present

## 2012-06-06 DIAGNOSIS — I4891 Unspecified atrial fibrillation: Secondary | ICD-10-CM | POA: Diagnosis not present

## 2012-06-06 DIAGNOSIS — I10 Essential (primary) hypertension: Secondary | ICD-10-CM | POA: Diagnosis not present

## 2012-06-06 DIAGNOSIS — Z7901 Long term (current) use of anticoagulants: Secondary | ICD-10-CM | POA: Diagnosis not present

## 2012-06-08 DIAGNOSIS — Z0000 Encounter for general adult medical examination without abnormal findings: Secondary | ICD-10-CM | POA: Diagnosis not present

## 2012-06-08 DIAGNOSIS — Z79899 Other long term (current) drug therapy: Secondary | ICD-10-CM | POA: Diagnosis not present

## 2012-06-08 DIAGNOSIS — N183 Chronic kidney disease, stage 3 unspecified: Secondary | ICD-10-CM | POA: Diagnosis not present

## 2012-06-08 DIAGNOSIS — I129 Hypertensive chronic kidney disease with stage 1 through stage 4 chronic kidney disease, or unspecified chronic kidney disease: Secondary | ICD-10-CM | POA: Diagnosis not present

## 2012-06-08 DIAGNOSIS — Z1331 Encounter for screening for depression: Secondary | ICD-10-CM | POA: Diagnosis not present

## 2012-06-09 DIAGNOSIS — R42 Dizziness and giddiness: Secondary | ICD-10-CM | POA: Diagnosis not present

## 2012-06-10 DIAGNOSIS — R42 Dizziness and giddiness: Secondary | ICD-10-CM | POA: Diagnosis not present

## 2012-06-20 DIAGNOSIS — H903 Sensorineural hearing loss, bilateral: Secondary | ICD-10-CM | POA: Diagnosis not present

## 2012-06-20 DIAGNOSIS — H60399 Other infective otitis externa, unspecified ear: Secondary | ICD-10-CM | POA: Diagnosis not present

## 2012-06-28 DIAGNOSIS — Z79899 Other long term (current) drug therapy: Secondary | ICD-10-CM | POA: Diagnosis not present

## 2012-06-28 DIAGNOSIS — I4891 Unspecified atrial fibrillation: Secondary | ICD-10-CM | POA: Diagnosis not present

## 2012-06-28 DIAGNOSIS — I129 Hypertensive chronic kidney disease with stage 1 through stage 4 chronic kidney disease, or unspecified chronic kidney disease: Secondary | ICD-10-CM | POA: Diagnosis not present

## 2012-07-12 DIAGNOSIS — I4891 Unspecified atrial fibrillation: Secondary | ICD-10-CM | POA: Diagnosis not present

## 2012-08-02 DIAGNOSIS — I4891 Unspecified atrial fibrillation: Secondary | ICD-10-CM | POA: Diagnosis not present

## 2012-08-16 DIAGNOSIS — D235 Other benign neoplasm of skin of trunk: Secondary | ICD-10-CM | POA: Diagnosis not present

## 2012-08-16 DIAGNOSIS — R209 Unspecified disturbances of skin sensation: Secondary | ICD-10-CM | POA: Diagnosis not present

## 2012-08-16 DIAGNOSIS — D485 Neoplasm of uncertain behavior of skin: Secondary | ICD-10-CM | POA: Diagnosis not present

## 2012-08-23 DIAGNOSIS — M79609 Pain in unspecified limb: Secondary | ICD-10-CM | POA: Diagnosis not present

## 2012-08-24 ENCOUNTER — Ambulatory Visit
Admission: RE | Admit: 2012-08-24 | Discharge: 2012-08-24 | Disposition: A | Discharge status: Home / Self Care | Payer: Medicare Other | Source: Ambulatory Visit | Attending: Internal Medicine | Admitting: Internal Medicine

## 2012-08-24 ENCOUNTER — Other Ambulatory Visit: Payer: Self-pay | Admitting: Internal Medicine

## 2012-08-24 DIAGNOSIS — M79609 Pain in unspecified limb: Secondary | ICD-10-CM | POA: Diagnosis not present

## 2012-08-24 DIAGNOSIS — R609 Edema, unspecified: Secondary | ICD-10-CM

## 2012-08-24 DIAGNOSIS — M7989 Other specified soft tissue disorders: Secondary | ICD-10-CM | POA: Diagnosis not present

## 2012-08-24 DIAGNOSIS — Z7901 Long term (current) use of anticoagulants: Secondary | ICD-10-CM | POA: Diagnosis not present

## 2012-09-13 DIAGNOSIS — I4891 Unspecified atrial fibrillation: Secondary | ICD-10-CM | POA: Diagnosis not present

## 2012-10-11 DIAGNOSIS — I4891 Unspecified atrial fibrillation: Secondary | ICD-10-CM | POA: Diagnosis not present

## 2012-11-08 DIAGNOSIS — I4891 Unspecified atrial fibrillation: Secondary | ICD-10-CM | POA: Diagnosis not present

## 2012-11-12 DIAGNOSIS — N3000 Acute cystitis without hematuria: Secondary | ICD-10-CM | POA: Diagnosis not present

## 2012-11-14 ENCOUNTER — Other Ambulatory Visit: Payer: Self-pay

## 2012-11-14 DIAGNOSIS — Z1231 Encounter for screening mammogram for malignant neoplasm of breast: Secondary | ICD-10-CM

## 2012-12-01 DIAGNOSIS — Z01419 Encounter for gynecological examination (general) (routine) without abnormal findings: Secondary | ICD-10-CM | POA: Diagnosis not present

## 2012-12-01 DIAGNOSIS — Z124 Encounter for screening for malignant neoplasm of cervix: Secondary | ICD-10-CM | POA: Diagnosis not present

## 2012-12-01 DIAGNOSIS — N951 Menopausal and female climacteric states: Secondary | ICD-10-CM | POA: Diagnosis not present

## 2012-12-01 DIAGNOSIS — N8111 Cystocele, midline: Secondary | ICD-10-CM | POA: Diagnosis not present

## 2012-12-02 DIAGNOSIS — I129 Hypertensive chronic kidney disease with stage 1 through stage 4 chronic kidney disease, or unspecified chronic kidney disease: Secondary | ICD-10-CM | POA: Diagnosis not present

## 2012-12-02 DIAGNOSIS — I4891 Unspecified atrial fibrillation: Secondary | ICD-10-CM | POA: Diagnosis not present

## 2012-12-02 DIAGNOSIS — Z7901 Long term (current) use of anticoagulants: Secondary | ICD-10-CM | POA: Diagnosis not present

## 2012-12-02 DIAGNOSIS — I10 Essential (primary) hypertension: Secondary | ICD-10-CM | POA: Diagnosis not present

## 2012-12-06 DIAGNOSIS — I4891 Unspecified atrial fibrillation: Secondary | ICD-10-CM | POA: Diagnosis not present

## 2012-12-07 DIAGNOSIS — I4891 Unspecified atrial fibrillation: Secondary | ICD-10-CM | POA: Diagnosis not present

## 2012-12-07 DIAGNOSIS — N183 Chronic kidney disease, stage 3 unspecified: Secondary | ICD-10-CM | POA: Diagnosis not present

## 2012-12-07 DIAGNOSIS — I129 Hypertensive chronic kidney disease with stage 1 through stage 4 chronic kidney disease, or unspecified chronic kidney disease: Secondary | ICD-10-CM | POA: Diagnosis not present

## 2012-12-22 DIAGNOSIS — J309 Allergic rhinitis, unspecified: Secondary | ICD-10-CM | POA: Diagnosis not present

## 2012-12-22 DIAGNOSIS — J069 Acute upper respiratory infection, unspecified: Secondary | ICD-10-CM | POA: Diagnosis not present

## 2012-12-23 ENCOUNTER — Ambulatory Visit
Admission: RE | Admit: 2012-12-23 | Discharge: 2012-12-23 | Disposition: A | Discharge status: Home / Self Care | Payer: Medicare Other | Source: Ambulatory Visit

## 2012-12-23 DIAGNOSIS — Z1231 Encounter for screening mammogram for malignant neoplasm of breast: Secondary | ICD-10-CM

## 2013-01-03 DIAGNOSIS — I129 Hypertensive chronic kidney disease with stage 1 through stage 4 chronic kidney disease, or unspecified chronic kidney disease: Secondary | ICD-10-CM | POA: Diagnosis not present

## 2013-01-03 DIAGNOSIS — I4891 Unspecified atrial fibrillation: Secondary | ICD-10-CM | POA: Diagnosis not present

## 2013-01-24 DIAGNOSIS — I4891 Unspecified atrial fibrillation: Secondary | ICD-10-CM | POA: Diagnosis not present

## 2013-02-21 DIAGNOSIS — I4891 Unspecified atrial fibrillation: Secondary | ICD-10-CM | POA: Diagnosis not present

## 2013-03-21 DIAGNOSIS — D485 Neoplasm of uncertain behavior of skin: Secondary | ICD-10-CM | POA: Diagnosis not present

## 2013-03-21 DIAGNOSIS — I4891 Unspecified atrial fibrillation: Secondary | ICD-10-CM | POA: Diagnosis not present

## 2013-03-21 DIAGNOSIS — L570 Actinic keratosis: Secondary | ICD-10-CM | POA: Diagnosis not present

## 2013-03-21 DIAGNOSIS — I781 Nevus, non-neoplastic: Secondary | ICD-10-CM | POA: Diagnosis not present

## 2013-04-18 DIAGNOSIS — Z7901 Long term (current) use of anticoagulants: Secondary | ICD-10-CM | POA: Diagnosis not present

## 2013-05-11 DIAGNOSIS — Z23 Encounter for immunization: Secondary | ICD-10-CM | POA: Diagnosis not present

## 2013-05-16 DIAGNOSIS — Z7901 Long term (current) use of anticoagulants: Secondary | ICD-10-CM | POA: Diagnosis not present

## 2013-05-17 DIAGNOSIS — H2589 Other age-related cataract: Secondary | ICD-10-CM | POA: Diagnosis not present

## 2013-05-23 DIAGNOSIS — E079 Disorder of thyroid, unspecified: Secondary | ICD-10-CM | POA: Insufficient documentation

## 2013-05-23 DIAGNOSIS — N189 Chronic kidney disease, unspecified: Secondary | ICD-10-CM | POA: Insufficient documentation

## 2013-05-23 DIAGNOSIS — I10 Essential (primary) hypertension: Secondary | ICD-10-CM | POA: Insufficient documentation

## 2013-05-23 DIAGNOSIS — K625 Hemorrhage of anus and rectum: Secondary | ICD-10-CM | POA: Insufficient documentation

## 2013-05-23 DIAGNOSIS — J45909 Unspecified asthma, uncomplicated: Secondary | ICD-10-CM | POA: Insufficient documentation

## 2013-05-23 DIAGNOSIS — K648 Other hemorrhoids: Secondary | ICD-10-CM | POA: Insufficient documentation

## 2013-06-07 ENCOUNTER — Ambulatory Visit: Payer: Medicare Other | Admitting: Cardiology

## 2013-06-09 DIAGNOSIS — I4891 Unspecified atrial fibrillation: Secondary | ICD-10-CM | POA: Diagnosis not present

## 2013-06-09 DIAGNOSIS — I129 Hypertensive chronic kidney disease with stage 1 through stage 4 chronic kidney disease, or unspecified chronic kidney disease: Secondary | ICD-10-CM | POA: Diagnosis not present

## 2013-06-09 DIAGNOSIS — E039 Hypothyroidism, unspecified: Secondary | ICD-10-CM | POA: Diagnosis not present

## 2013-06-09 DIAGNOSIS — Z1331 Encounter for screening for depression: Secondary | ICD-10-CM | POA: Diagnosis not present

## 2013-06-09 DIAGNOSIS — Z79899 Other long term (current) drug therapy: Secondary | ICD-10-CM | POA: Diagnosis not present

## 2013-06-09 DIAGNOSIS — Z0000 Encounter for general adult medical examination without abnormal findings: Secondary | ICD-10-CM | POA: Diagnosis not present

## 2013-06-09 DIAGNOSIS — N183 Chronic kidney disease, stage 3 unspecified: Secondary | ICD-10-CM | POA: Diagnosis not present

## 2013-06-13 DIAGNOSIS — Z79899 Other long term (current) drug therapy: Secondary | ICD-10-CM | POA: Diagnosis not present

## 2013-06-13 DIAGNOSIS — Z7901 Long term (current) use of anticoagulants: Secondary | ICD-10-CM | POA: Diagnosis not present

## 2013-06-13 DIAGNOSIS — I129 Hypertensive chronic kidney disease with stage 1 through stage 4 chronic kidney disease, or unspecified chronic kidney disease: Secondary | ICD-10-CM | POA: Diagnosis not present

## 2013-06-13 DIAGNOSIS — E039 Hypothyroidism, unspecified: Secondary | ICD-10-CM | POA: Diagnosis not present

## 2013-06-16 DIAGNOSIS — H903 Sensorineural hearing loss, bilateral: Secondary | ICD-10-CM | POA: Diagnosis not present

## 2013-07-11 DIAGNOSIS — Z7901 Long term (current) use of anticoagulants: Secondary | ICD-10-CM | POA: Diagnosis not present

## 2013-07-11 DIAGNOSIS — I4891 Unspecified atrial fibrillation: Secondary | ICD-10-CM | POA: Diagnosis not present

## 2013-07-21 ENCOUNTER — Ambulatory Visit: Payer: Medicare Other | Admitting: Cardiology

## 2013-07-25 ENCOUNTER — Ambulatory Visit: Payer: Medicare Other | Admitting: Cardiology

## 2013-07-31 ENCOUNTER — Encounter: Payer: Self-pay | Admitting: Cardiology

## 2013-07-31 ENCOUNTER — Ambulatory Visit (INDEPENDENT_AMBULATORY_CARE_PROVIDER_SITE_OTHER): Payer: Medicare Other | Admitting: Cardiology

## 2013-07-31 VITALS — BP 130/62 | HR 60 | Ht 65.0 in | Wt 166.0 lb

## 2013-07-31 DIAGNOSIS — Z7901 Long term (current) use of anticoagulants: Secondary | ICD-10-CM | POA: Diagnosis not present

## 2013-07-31 DIAGNOSIS — I10 Essential (primary) hypertension: Secondary | ICD-10-CM

## 2013-07-31 DIAGNOSIS — I4891 Unspecified atrial fibrillation: Secondary | ICD-10-CM

## 2013-07-31 DIAGNOSIS — R42 Dizziness and giddiness: Secondary | ICD-10-CM | POA: Diagnosis not present

## 2013-07-31 HISTORY — DX: Unspecified atrial fibrillation: I48.91

## 2013-07-31 NOTE — Patient Instructions (Signed)
Your physician has recommended you make the following change in your medication:   1. Stop Hydrochlorothiazide  Your physician wants you to follow-up in: 6 months with Dr. Skians. You will receive a reminder letter in the mail two months in advance. If you don't receive a letter, please call our office to schedule the follow-up appointment.   

## 2013-07-31 NOTE — Progress Notes (Signed)
[address] [place], [place]  [address] Phone: [phone] Fax:  [phone]  Date:  07/31/2013   ID:  Frances [name], DOB 1934/07/20, MRN [id number]  PCP:  Ginette Otto, MD   History of Present Illness: Frances [name] is a 79 y.o. female for followup, has paroxysmal atrial fibrillation and chronic kidney disease stage III. Her GFR is 54. She is on Coumadin for anticoagulation.   Her stress test 9/11 is low risk without any evidence of ischemia. Because of this, I am using antiarrhythmic therapy, flecainide. Her ejection fraction was normal, thickness was normal on echocardiogram. Flecainide 100 mg twice a day. Treadmill reassuring. No palpitations.   Felt some lightheaded sometimes more than once a day. Fleeting. Prior afib symptom was a little bit of chest pain upper chest wall like "when a child running hard".  Anticoagulation monitoring-currently being performed by Dr. Pete Glatter.  Hypertension-currently well controlled, calcium channel blocker, ACE inhibitor, hydrochlorothiazide.. Dr. Narda Rutherford Readings from Last 3 Encounters:  07/31/13 166 lb (75.297 kg)  06/15/11 173 lb (78.472 kg)  04/13/11 173 lb 9.6 oz (78.744 kg)     Past Medical History  Diagnosis Date  . Hypertension   . Thyroid disease     hypothyroidism  . Atrial fibrillation   . Chronic kidney disease     stage 3 renal disease  . Internal hemorrhoid   . Rectal bleeding   . Asthmatic bronchitis     Past Surgical History  Procedure Laterality Date  . Tonsillectomy    . Appendectomy    . Dilation and curettage of uterus    . Abdominal hysterectomy    . Back surgery    . Cystocele repair    . Joint replacement      left thumb replacement  . Colonoscopy    . Hand surgery      left  . Hemorrhoid injection  04/2011    Current Outpatient Prescriptions  Medication Sig Dispense Refill  . Calcium Carbonate-Vitamin D (CALCIUM-VITAMIN D) 500-200 MG-UNIT per tablet  Take 1 tablet by mouth 2 (two) times daily with a meal.        . DILT-XR 120 MG 24 hr capsule Take 120 mg by mouth daily.       . fish oil-omega-3 fatty acids 1000 MG capsule Take 2 g by mouth daily.        . flecainide (TAMBOCOR) 100 MG tablet Take 100 mg by mouth 2 (two) times daily.        . hydrochlorothiazide 25 MG tablet Take 25 mg by mouth daily.        Marland Kitchen levothyroxine (SYNTHROID, LEVOTHROID) 75 MCG tablet Take 75 mcg by mouth daily.        . Multiple Vitamin (MULTIVITAMIN) tablet Take 1 tablet by mouth daily.        . Probiotic Product (PROBIOTIC PO) Take by mouth daily.        . Psyllium (METAMUCIL PO) Take by mouth daily.        . ramipril (ALTACE) 10 MG tablet Take 10 mg by mouth daily.        . vitamin C (ASCORBIC ACID) 500 MG tablet Take 500 mg by mouth daily.        Marland Kitchen warfarin (COUMADIN) 3 MG tablet Take 3 mg by mouth daily.         No current facility-administered medications for this visit.    Allergies:  Allergies  Allergen Reactions  . Codeine Nausea And Vomiting  . Cephalexin Anxiety  . Sudafed [Pseudoephedrine Hcl] Anxiety  . Sulfa Antibiotics Rash    Social History:  The patient  reports that she has never smoked. She does not have any smokeless tobacco history on file. She reports that she does not drink alcohol or use illicit drugs.   ROS:  Please see the history of present illness.   No bleeding, no syncope, no orthopnea, no PND  PHYSICAL EXAM: VS:  BP 130/62  Pulse 60  Ht 5\' 5"  (1.651 m)  Wt 166 lb (75.297 kg)  BMI 27.62 kg/m2 Well nourished, well developed, in no acute distress HEENT: normal Neck: no JVD Cardiac:  normal S1, S2; RRR; no murmur Lungs:  clear to auscultation bilaterally, no wheezing, rhonchi or rales Abd: soft, nontender, no hepatomegaly Ext: no edema Skin: warm and dry Neuro: no focal abnormalities noted  EKG:  Sinus rhythm, first degree AV block, 220 ms, heart rate of 60, normal QRS duration, left axis deviation, poor R wave  progression.     ASSESSMENT AND PLAN:  1. Paroxysmal atrial fibrillation-currently doing well with anticoagulation, antiarrhythmic. Low-dose diltiazem. This is been reduced in the past because of pause at night. No changes made. 2. Dizziness-I'm going to discontinue her hydrochlorothiazide. Her blood pressure seems to be under good control. I'm wondering if this is causing some of her dizziness. 3. On anticoagulation-continue warfarin. No bleeding.  Signed, Donato Schultz, MD [facility]  07/31/2013 3:47 PM

## 2013-08-02 DIAGNOSIS — S6390XA Sprain of unspecified part of unspecified wrist and hand, initial encounter: Secondary | ICD-10-CM | POA: Diagnosis not present

## 2013-08-08 DIAGNOSIS — I4891 Unspecified atrial fibrillation: Secondary | ICD-10-CM | POA: Diagnosis not present

## 2013-08-08 DIAGNOSIS — Z7901 Long term (current) use of anticoagulants: Secondary | ICD-10-CM | POA: Diagnosis not present

## 2013-08-17 DIAGNOSIS — S6390XA Sprain of unspecified part of unspecified wrist and hand, initial encounter: Secondary | ICD-10-CM | POA: Diagnosis not present

## 2013-09-05 DIAGNOSIS — Z7901 Long term (current) use of anticoagulants: Secondary | ICD-10-CM | POA: Diagnosis not present

## 2013-09-05 DIAGNOSIS — Z8679 Personal history of other diseases of the circulatory system: Secondary | ICD-10-CM | POA: Diagnosis not present

## 2013-09-06 DIAGNOSIS — S6390XA Sprain of unspecified part of unspecified wrist and hand, initial encounter: Secondary | ICD-10-CM | POA: Diagnosis not present

## 2013-09-11 DIAGNOSIS — J309 Allergic rhinitis, unspecified: Secondary | ICD-10-CM | POA: Diagnosis not present

## 2013-09-11 DIAGNOSIS — J040 Acute laryngitis: Secondary | ICD-10-CM | POA: Diagnosis not present

## 2013-09-26 DIAGNOSIS — R350 Frequency of micturition: Secondary | ICD-10-CM | POA: Diagnosis not present

## 2013-09-29 DIAGNOSIS — N8111 Cystocele, midline: Secondary | ICD-10-CM | POA: Diagnosis not present

## 2013-09-29 DIAGNOSIS — N952 Postmenopausal atrophic vaginitis: Secondary | ICD-10-CM | POA: Diagnosis not present

## 2013-10-03 DIAGNOSIS — Z8679 Personal history of other diseases of the circulatory system: Secondary | ICD-10-CM | POA: Diagnosis not present

## 2013-10-03 DIAGNOSIS — Z7901 Long term (current) use of anticoagulants: Secondary | ICD-10-CM | POA: Diagnosis not present

## 2013-10-03 DIAGNOSIS — Z23 Encounter for immunization: Secondary | ICD-10-CM | POA: Diagnosis not present

## 2013-10-04 DIAGNOSIS — H6980 Other specified disorders of Eustachian tube, unspecified ear: Secondary | ICD-10-CM | POA: Diagnosis not present

## 2013-10-10 DIAGNOSIS — I129 Hypertensive chronic kidney disease with stage 1 through stage 4 chronic kidney disease, or unspecified chronic kidney disease: Secondary | ICD-10-CM | POA: Diagnosis not present

## 2013-10-10 DIAGNOSIS — N183 Chronic kidney disease, stage 3 unspecified: Secondary | ICD-10-CM | POA: Diagnosis not present

## 2013-10-10 DIAGNOSIS — R21 Rash and other nonspecific skin eruption: Secondary | ICD-10-CM | POA: Diagnosis not present

## 2013-10-17 DIAGNOSIS — H6980 Other specified disorders of Eustachian tube, unspecified ear: Secondary | ICD-10-CM | POA: Diagnosis not present

## 2013-10-17 DIAGNOSIS — Z8679 Personal history of other diseases of the circulatory system: Secondary | ICD-10-CM | POA: Diagnosis not present

## 2013-10-17 DIAGNOSIS — Z7901 Long term (current) use of anticoagulants: Secondary | ICD-10-CM | POA: Diagnosis not present

## 2013-10-17 DIAGNOSIS — J310 Chronic rhinitis: Secondary | ICD-10-CM | POA: Diagnosis not present

## 2013-10-23 DIAGNOSIS — Z8679 Personal history of other diseases of the circulatory system: Secondary | ICD-10-CM | POA: Diagnosis not present

## 2013-10-23 DIAGNOSIS — Z7901 Long term (current) use of anticoagulants: Secondary | ICD-10-CM | POA: Diagnosis not present

## 2013-11-07 DIAGNOSIS — N8111 Cystocele, midline: Secondary | ICD-10-CM | POA: Diagnosis not present

## 2013-11-13 DIAGNOSIS — Z7901 Long term (current) use of anticoagulants: Secondary | ICD-10-CM | POA: Diagnosis not present

## 2013-11-13 DIAGNOSIS — I4891 Unspecified atrial fibrillation: Secondary | ICD-10-CM | POA: Diagnosis not present

## 2013-11-23 ENCOUNTER — Other Ambulatory Visit: Payer: Self-pay

## 2013-11-23 DIAGNOSIS — Z1231 Encounter for screening mammogram for malignant neoplasm of breast: Secondary | ICD-10-CM

## 2013-12-13 DIAGNOSIS — H902 Conductive hearing loss, unspecified: Secondary | ICD-10-CM | POA: Diagnosis not present

## 2013-12-13 DIAGNOSIS — H6500 Acute serous otitis media, unspecified ear: Secondary | ICD-10-CM | POA: Diagnosis not present

## 2013-12-15 DIAGNOSIS — I129 Hypertensive chronic kidney disease with stage 1 through stage 4 chronic kidney disease, or unspecified chronic kidney disease: Secondary | ICD-10-CM | POA: Diagnosis not present

## 2013-12-15 DIAGNOSIS — Z7901 Long term (current) use of anticoagulants: Secondary | ICD-10-CM | POA: Diagnosis not present

## 2013-12-19 ENCOUNTER — Encounter (HOSPITAL_COMMUNITY): Payer: Self-pay | Admitting: Pharmacist

## 2013-12-21 ENCOUNTER — Other Ambulatory Visit: Payer: Self-pay | Admitting: Obstetrics

## 2013-12-25 ENCOUNTER — Ambulatory Visit
Admission: RE | Admit: 2013-12-25 | Discharge: 2013-12-25 | Disposition: A | Discharge status: Home / Self Care | Payer: Medicare Other | Source: Ambulatory Visit

## 2013-12-25 DIAGNOSIS — Z1231 Encounter for screening mammogram for malignant neoplasm of breast: Secondary | ICD-10-CM | POA: Diagnosis not present

## 2014-01-01 ENCOUNTER — Encounter (HOSPITAL_COMMUNITY): Payer: Self-pay

## 2014-01-01 ENCOUNTER — Encounter (HOSPITAL_COMMUNITY)
Admission: RE | Admit: 2014-01-01 | Discharge: 2014-01-01 | Disposition: A | Discharge status: Home / Self Care | Payer: Medicare Other | Source: Ambulatory Visit | Attending: Obstetrics | Admitting: Obstetrics

## 2014-01-01 DIAGNOSIS — N189 Chronic kidney disease, unspecified: Secondary | ICD-10-CM | POA: Diagnosis not present

## 2014-01-01 DIAGNOSIS — I129 Hypertensive chronic kidney disease with stage 1 through stage 4 chronic kidney disease, or unspecified chronic kidney disease: Secondary | ICD-10-CM | POA: Diagnosis not present

## 2014-01-01 DIAGNOSIS — H9190 Unspecified hearing loss, unspecified ear: Secondary | ICD-10-CM | POA: Diagnosis not present

## 2014-01-01 DIAGNOSIS — J45909 Unspecified asthma, uncomplicated: Secondary | ICD-10-CM | POA: Diagnosis not present

## 2014-01-01 DIAGNOSIS — I739 Peripheral vascular disease, unspecified: Secondary | ICD-10-CM | POA: Diagnosis not present

## 2014-01-01 DIAGNOSIS — N993 Prolapse of vaginal vault after hysterectomy: Secondary | ICD-10-CM | POA: Diagnosis not present

## 2014-01-01 DIAGNOSIS — I4891 Unspecified atrial fibrillation: Secondary | ICD-10-CM | POA: Diagnosis not present

## 2014-01-01 DIAGNOSIS — E039 Hypothyroidism, unspecified: Secondary | ICD-10-CM | POA: Diagnosis not present

## 2014-01-01 HISTORY — DX: Cardiac arrhythmia, unspecified: I49.9

## 2014-01-01 HISTORY — DX: Unspecified hearing loss, unspecified ear: H91.90

## 2014-01-01 HISTORY — DX: Unspecified osteoarthritis, unspecified site: M19.90

## 2014-01-01 HISTORY — DX: Hypothyroidism, unspecified: E03.9

## 2014-01-01 HISTORY — DX: Peripheral vascular disease, unspecified: I73.9

## 2014-01-01 LAB — BASIC METABOLIC PANEL
BUN: 19 mg/dL (ref 6–23)
CALCIUM: 9.6 mg/dL (ref 8.4–10.5)
CO2: 30 meq/L (ref 19–32)
Chloride: 101 mEq/L (ref 96–112)
Creatinine, Ser: 0.84 mg/dL (ref 0.50–1.10)
GFR, EST AFRICAN AMERICAN: 75 mL/min — AB (ref >90)
GFR, EST NON AFRICAN AMERICAN: 65 mL/min — AB (ref >90)
Glucose, Bld: 89 mg/dL (ref 70–99)
Potassium: 4.5 mEq/L (ref 3.7–5.3)
SODIUM: 141 meq/L (ref 137–147)

## 2014-01-01 LAB — CBC
HCT: 43.3 % (ref 36.0–46.0)
Hemoglobin: 14.2 g/dL (ref 12.0–15.0)
MCH: 28 pg (ref 26.0–34.0)
MCHC: 32.8 g/dL (ref 30.0–36.0)
MCV: 85.4 fL (ref 78.0–100.0)
PLATELETS: 184 10*3/uL (ref 150–400)
RBC: 5.07 MIL/uL (ref 3.87–5.11)
RDW: 14.4 % (ref 11.5–15.5)
WBC: 6.5 10*3/uL (ref 4.0–10.5)

## 2014-01-01 NOTE — Pre-Procedure Instructions (Signed)
Faxed sent to patient's cardiologist, Mr. Frances [name] ([place], [place]  Fax [phone]) for surgical clearance (surgery 01/03/14).  Shanelle at MD's office was infomed of surgical clearance needed by Dr Marlou Porch.

## 2014-01-01 NOTE — Patient Instructions (Addendum)
   Your procedure is scheduled on:  Wednesday, April 29  Enter through the [employer] [facility] at:  [place] up the phone at the desk and dial [phone] and inform us of your arrival.  Please call this number if you have any problems the morning of surgery: [phone]  Remember: Do not eat food after midnight:  Tuesday Take these medicines the morning of surgery with a SIP OF WATER:  Diltiazam, hctz, flecainide, synthroid, ramipril  Do not wear jewelry, make-up, or FINGER nail polish No metal in your hair or on your body. Do not wear lotions, powders, perfumes.  You may wear deodorant.  Do not bring valuables to the hospital. Contacts, dentures or bridgework may not be worn into surgery.  Leave suitcase in the car. After Surgery it may be brought to your room. For patients being admitted to the hospital, checkout time is 11:00am the day of discharge.  Home with husband Deidre Ala.

## 2014-01-02 ENCOUNTER — Telehealth: Payer: Self-pay | Admitting: Cardiology

## 2014-01-02 MED ORDER — CEFAZOLIN SODIUM-DEXTROSE 2-3 GM-% IV SOLR
2.0000 g | INTRAVENOUS | Status: AC
  Administered 2014-01-03: 2 g via INTRAVENOUS

## 2014-01-02 NOTE — Pre-Procedure Instructions (Signed)
Follow up with Dr. Marlou Porch office regarding patient's clearance for surgery.  Have not received clearance back yet..  An e-mail message was sent to MD asking for clearance by his MA - Kenyatta.

## 2014-01-02 NOTE — H&P (Signed)
See scanned H&P for full details.  In short, 80 yo G2P2 for cystocele repair due to recurrent cystocele, s/p hysterectomy w/ A&P repair 17 yrs ago. Pt has tried multiple pessaries without success and thus presents for re-operation of a cystocele proximal to the prior repair.  Pt has a h/o Afib and has been managed with Coumadin. She stopped Coumadin 1 wk ago, switched to Lovenox, and has held that dose 24 hrs prior to her surgery.  PMH: Afib, atrophic vaginitis Past Medical History  Diagnosis Date  . Thyroid disease     hypothyroidism  . Atrial fibrillation   . Internal hemorrhoid   . Rectal bleeding   . SVD (spontaneous vaginal delivery) 1957, 1959    x 2  . Peripheral vascular disease     right arm and right shoulder blood clots r/t a fall  . Hypothyroidism   . Hypertension     controlled with meds  . Dysrhythmia     Hx - a-fib 05/2010 - tx with meds, no problem since 05/2010  . Chronic kidney disease     stage 3 renal disease - no med  . Arthritis     hands, back  . Hearing loss     bilateral hearing aids    Past Surgical History  Procedure Laterality Date  . Tonsillectomy    . Appendectomy    . Cystocele repair    . Joint replacement      left thumb replacement  . Colonoscopy    . Hand surgery      left  . Hemorrhoid injection  04/2011  . Dental surgery      infected tooth - general  . Dilation and curettage of uterus      x several  . Abdominal hysterectomy      and right ovary  . Back surgery      x 3 - 2 rods and 8 screws   All: Sulfa, Sudafed, Prevnar  PE:  Filed Vitals:   01/03/14 0832  BP: 131/74  Pulse: 68  Temp: 97.9 F (36.6 C)  TempSrc: Oral  Resp: 20  SpO2: 100%    Per scanned H&P  CBC    Component Value Date/Time   WBC 6.5 01/01/2014 1030   RBC 5.07 01/01/2014 1030   HGB 14.2 01/01/2014 1030   HCT 43.3 01/01/2014 1030   PLT 184 01/01/2014 1030   MCV 85.4 01/01/2014 1030   MCH 28.0 01/01/2014 1030   MCHC 32.8 01/01/2014 1030   RDW 14.4  01/01/2014 1030    A/P: cystocele repair through Lovenox window. Pt aware risks of recurrence as no mesh planned, also risks of voiding dysfunction, infection, bleeding and immediate surgical complications.   Elberta Lachapelle A. Kasarah Sitts 01/03/2014 10:26 AM

## 2014-01-02 NOTE — Telephone Encounter (Signed)
New message    Form sent over on yesterday through medical records fax # [phone]. @ 11:32 .    Office calling checking on status

## 2014-01-03 ENCOUNTER — Ambulatory Visit (HOSPITAL_COMMUNITY)
Admission: RE | Admit: 2014-01-03 | Discharge: 2014-01-04 | Disposition: A | Discharge status: Home / Self Care | Payer: Medicare Other | Source: Ambulatory Visit | Attending: Obstetrics | Admitting: Obstetrics

## 2014-01-03 ENCOUNTER — Encounter (HOSPITAL_COMMUNITY): Payer: Self-pay | Admitting: Anesthesiology

## 2014-01-03 ENCOUNTER — Encounter (HOSPITAL_COMMUNITY): Payer: Medicare Other | Admitting: Anesthesiology

## 2014-01-03 ENCOUNTER — Encounter (HOSPITAL_COMMUNITY)
Admission: RE | Disposition: A | Discharge status: Home / Self Care | Payer: Self-pay | Source: Ambulatory Visit | Attending: Obstetrics

## 2014-01-03 ENCOUNTER — Ambulatory Visit (HOSPITAL_COMMUNITY): Payer: Medicare Other | Admitting: Anesthesiology

## 2014-01-03 DIAGNOSIS — N993 Prolapse of vaginal vault after hysterectomy: Secondary | ICD-10-CM | POA: Insufficient documentation

## 2014-01-03 DIAGNOSIS — I129 Hypertensive chronic kidney disease with stage 1 through stage 4 chronic kidney disease, or unspecified chronic kidney disease: Secondary | ICD-10-CM | POA: Insufficient documentation

## 2014-01-03 DIAGNOSIS — H9190 Unspecified hearing loss, unspecified ear: Secondary | ICD-10-CM | POA: Insufficient documentation

## 2014-01-03 DIAGNOSIS — J45909 Unspecified asthma, uncomplicated: Secondary | ICD-10-CM | POA: Insufficient documentation

## 2014-01-03 DIAGNOSIS — I739 Peripheral vascular disease, unspecified: Secondary | ICD-10-CM | POA: Insufficient documentation

## 2014-01-03 DIAGNOSIS — E039 Hypothyroidism, unspecified: Secondary | ICD-10-CM | POA: Insufficient documentation

## 2014-01-03 DIAGNOSIS — IMO0002 Reserved for concepts with insufficient information to code with codable children: Secondary | ICD-10-CM | POA: Diagnosis present

## 2014-01-03 DIAGNOSIS — N8111 Cystocele, midline: Secondary | ICD-10-CM | POA: Diagnosis not present

## 2014-01-03 DIAGNOSIS — I4891 Unspecified atrial fibrillation: Secondary | ICD-10-CM | POA: Diagnosis not present

## 2014-01-03 DIAGNOSIS — N189 Chronic kidney disease, unspecified: Secondary | ICD-10-CM | POA: Insufficient documentation

## 2014-01-03 HISTORY — PX: ANTERIOR AND POSTERIOR REPAIR: SHX5121

## 2014-01-03 LAB — PROTIME-INR
INR: 1.11 (ref 0.00–1.49)
Prothrombin Time: 14.1 seconds (ref 11.6–15.2)

## 2014-01-03 SURGERY — ANTERIOR (CYSTOCELE) AND POSTERIOR REPAIR (RECTOCELE)
Anesthesia: General | Site: Vagina

## 2014-01-03 MED ORDER — 0.9 % SODIUM CHLORIDE (POUR BTL) OPTIME
TOPICAL | Status: DC | PRN
  Administered 2014-01-03: 1000 mL

## 2014-01-03 MED ORDER — MIDAZOLAM HCL 2 MG/2ML IJ SOLN
INTRAMUSCULAR | Status: DC | PRN
  Administered 2014-01-03: 1 mg via INTRAVENOUS

## 2014-01-03 MED ORDER — ENOXAPARIN SODIUM 80 MG/0.8ML ~~LOC~~ SOLN
80.0000 mg | Freq: Two times a day (BID) | SUBCUTANEOUS | Status: DC
  Administered 2014-01-03 – 2014-01-04 (×2): 80 mg via SUBCUTANEOUS
  Filled 2014-01-03 (×2): qty 0.8

## 2014-01-03 MED ORDER — PROMETHAZINE HCL 25 MG/ML IJ SOLN: 6.2500 mg | INTRAMUSCULAR | Status: DC | PRN

## 2014-01-03 MED ORDER — WARFARIN - PHYSICIAN DOSING INPATIENT
Freq: Every day | Status: DC
Start: 2014-01-03 — End: 2014-01-04

## 2014-01-03 MED ORDER — ESTRADIOL 0.1 MG/GM VA CREA
TOPICAL_CREAM | VAGINAL | Status: AC
  Filled 2014-01-03: qty 42.5

## 2014-01-03 MED ORDER — MEPERIDINE HCL 25 MG/ML IJ SOLN
6.2500 mg | INTRAMUSCULAR | Status: DC | PRN
Start: 2014-01-03 — End: 2014-01-03

## 2014-01-03 MED ORDER — ESTRADIOL 0.1 MG/GM VA CREA
TOPICAL_CREAM | VAGINAL | Status: DC | PRN
  Administered 2014-01-03: 1 via VAGINAL

## 2014-01-03 MED ORDER — BUPIVACAINE-EPINEPHRINE 0.5% -1:200000 IJ SOLN
INTRAMUSCULAR | Status: DC | PRN
  Administered 2014-01-03: 10 mL
  Administered 2014-01-03: 7 mL
  Administered 2014-01-03: 4 mL

## 2014-01-03 MED ORDER — BUPIVACAINE-EPINEPHRINE (PF) 0.5% -1:200000 IJ SOLN
INTRAMUSCULAR | Status: AC
  Filled 2014-01-03: qty 30

## 2014-01-03 MED ORDER — KETOROLAC TROMETHAMINE 30 MG/ML IJ SOLN: 15.0000 mg | Freq: Once | INTRAMUSCULAR | Status: DC | PRN

## 2014-01-03 MED ORDER — MIDAZOLAM HCL 2 MG/2ML IJ SOLN: 0.5000 mg | Freq: Once | INTRAMUSCULAR | Status: DC | PRN

## 2014-01-03 MED ORDER — FENTANYL CITRATE 0.05 MG/ML IJ SOLN
INTRAMUSCULAR | Status: AC
  Filled 2014-01-03: qty 5

## 2014-01-03 MED ORDER — ROCURONIUM BROMIDE 100 MG/10ML IV SOLN
INTRAVENOUS | Status: AC
  Filled 2014-01-03: qty 1

## 2014-01-03 MED ORDER — ONDANSETRON HCL 4 MG PO TABS: 4.0000 mg | ORAL_TABLET | Freq: Four times a day (QID) | ORAL | Status: DC | PRN

## 2014-01-03 MED ORDER — LACTATED RINGERS IV SOLN
INTRAVENOUS | Status: DC
  Administered 2014-01-03 (×2): via INTRAVENOUS

## 2014-01-03 MED ORDER — DEXAMETHASONE SODIUM PHOSPHATE 10 MG/ML IJ SOLN
INTRAMUSCULAR | Status: AC
  Filled 2014-01-03: qty 1

## 2014-01-03 MED ORDER — FENTANYL CITRATE 0.05 MG/ML IJ SOLN: 25.0000 ug | INTRAMUSCULAR | Status: DC | PRN

## 2014-01-03 MED ORDER — ONDANSETRON HCL 4 MG/2ML IJ SOLN: 4.0000 mg | Freq: Four times a day (QID) | INTRAMUSCULAR | Status: DC | PRN

## 2014-01-03 MED ORDER — FLECAINIDE ACETATE 100 MG PO TABS
100.0000 mg | ORAL_TABLET | Freq: Two times a day (BID) | ORAL | Status: DC
  Administered 2014-01-03 – 2014-01-04 (×2): 100 mg via ORAL
  Filled 2014-01-03 (×2): qty 1

## 2014-01-03 MED ORDER — GLYCOPYRROLATE 0.2 MG/ML IJ SOLN
INTRAMUSCULAR | Status: AC
  Filled 2014-01-03: qty 3

## 2014-01-03 MED ORDER — PHENYLEPHRINE HCL 10 MG/ML IJ SOLN
INTRAMUSCULAR | Status: DC | PRN
  Administered 2014-01-03 (×4): 40 ug via INTRAVENOUS
  Administered 2014-01-03 (×2): 80 ug via INTRAVENOUS
  Administered 2014-01-03: 40 ug via INTRAVENOUS
  Administered 2014-01-03: 80 ug via INTRAVENOUS
  Administered 2014-01-03: 40 ug via INTRAVENOUS

## 2014-01-03 MED ORDER — CEFAZOLIN SODIUM-DEXTROSE 2-3 GM-% IV SOLR
INTRAVENOUS | Status: AC
  Filled 2014-01-03: qty 50

## 2014-01-03 MED ORDER — DOCUSATE SODIUM 50 MG/5ML PO LIQD: 100.0000 mg | Freq: Two times a day (BID) | ORAL | Status: DC

## 2014-01-03 MED ORDER — LIDOCAINE HCL (CARDIAC) 20 MG/ML IV SOLN
INTRAVENOUS | Status: AC
  Filled 2014-01-03: qty 5

## 2014-01-03 MED ORDER — WARFARIN SODIUM 3 MG PO TABS
3.0000 mg | ORAL_TABLET | Freq: Every day | ORAL | Status: DC
  Administered 2014-01-03: 3 mg via ORAL
  Filled 2014-01-03: qty 1

## 2014-01-03 MED ORDER — DOCUSATE SODIUM 100 MG PO CAPS
100.0000 mg | ORAL_CAPSULE | Freq: Two times a day (BID) | ORAL | Status: DC
  Administered 2014-01-03 – 2014-01-04 (×2): 100 mg via ORAL
  Filled 2014-01-03 (×2): qty 1

## 2014-01-03 MED ORDER — OXYCODONE-ACETAMINOPHEN 5-325 MG PO TABS: 1.0000 | ORAL_TABLET | ORAL | Status: DC | PRN

## 2014-01-03 MED ORDER — FENTANYL CITRATE 0.05 MG/ML IJ SOLN
INTRAMUSCULAR | Status: DC | PRN
  Administered 2014-01-03 (×2): 50 ug via INTRAVENOUS

## 2014-01-03 MED ORDER — HYDROMORPHONE HCL PF 1 MG/ML IJ SOLN: 0.5000 mg | INTRAMUSCULAR | Status: DC | PRN

## 2014-01-03 MED ORDER — FLECAINIDE ACETATE 100 MG PO TABS
100.0000 mg | ORAL_TABLET | Freq: Two times a day (BID) | ORAL | Status: DC
  Filled 2014-01-03 (×2): qty 1

## 2014-01-03 MED ORDER — MENTHOL 3 MG MT LOZG: 1.0000 | LOZENGE | OROMUCOSAL | Status: DC | PRN

## 2014-01-03 MED ORDER — IBUPROFEN 600 MG PO TABS: 600.0000 mg | ORAL_TABLET | Freq: Four times a day (QID) | ORAL | Status: DC | PRN

## 2014-01-03 MED ORDER — NEOSTIGMINE METHYLSULFATE 1 MG/ML IJ SOLN
INTRAMUSCULAR | Status: AC
  Filled 2014-01-03: qty 1

## 2014-01-03 MED ORDER — PROPOFOL 10 MG/ML IV EMUL
INTRAVENOUS | Status: AC
  Filled 2014-01-03: qty 20

## 2014-01-03 MED ORDER — KETOROLAC TROMETHAMINE 30 MG/ML IJ SOLN
INTRAMUSCULAR | Status: AC
  Filled 2014-01-03: qty 1

## 2014-01-03 MED ORDER — SODIUM CHLORIDE 0.9 % IV SOLN
INTRAVENOUS | Status: DC
  Administered 2014-01-03: 15:00:00 via INTRAVENOUS

## 2014-01-03 MED ORDER — GLYCOPYRROLATE 0.2 MG/ML IJ SOLN
INTRAMUSCULAR | Status: DC | PRN
Start: 2014-01-03 — End: 2014-01-03
  Administered 2014-01-03: 0.2 mg via INTRAVENOUS

## 2014-01-03 MED ORDER — ONDANSETRON HCL 4 MG/2ML IJ SOLN
INTRAMUSCULAR | Status: AC
  Filled 2014-01-03: qty 2

## 2014-01-03 MED ORDER — ONDANSETRON HCL 4 MG/2ML IJ SOLN
INTRAMUSCULAR | Status: DC | PRN
  Administered 2014-01-03: 4 mg via INTRAVENOUS

## 2014-01-03 MED ORDER — PANTOPRAZOLE SODIUM 40 MG PO TBEC
40.0000 mg | DELAYED_RELEASE_TABLET | Freq: Every day | ORAL | Status: DC
  Administered 2014-01-03 – 2014-01-04 (×2): 40 mg via ORAL
  Filled 2014-01-03 (×2): qty 1

## 2014-01-03 MED ORDER — MIDAZOLAM HCL 2 MG/2ML IJ SOLN
INTRAMUSCULAR | Status: AC
  Filled 2014-01-03: qty 2

## 2014-01-03 MED ORDER — DILTIAZEM HCL ER 180 MG PO CP24
180.0000 mg | ORAL_CAPSULE | ORAL | Status: DC
  Filled 2014-01-03: qty 1

## 2014-01-03 MED ORDER — DEXAMETHASONE SODIUM PHOSPHATE 4 MG/ML IJ SOLN
INTRAMUSCULAR | Status: DC | PRN
  Administered 2014-01-03: 10 mg via INTRAVENOUS

## 2014-01-03 SURGICAL SUPPLY — 29 items
BLADE 15 SAFETY STRL DISP (BLADE) ×3 IMPLANT
CANISTER SUCT 3000ML (MISCELLANEOUS) ×3 IMPLANT
CLOTH BEACON ORANGE TIMEOUT ST (SAFETY) ×3 IMPLANT
CONT PATH 16OZ SNAP LID 3702 (MISCELLANEOUS) IMPLANT
DECANTER SPIKE VIAL GLASS SM (MISCELLANEOUS) ×2 IMPLANT
DEVICE CAPIO SLIM SINGLE (INSTRUMENTS) IMPLANT
DISSECTOR SPONGE CHERRY (GAUZE/BANDAGES/DRESSINGS) ×2 IMPLANT
DRAPE HYSTEROSCOPY (DRAPE) ×3 IMPLANT
GAUZE PACKING 1 X5 YD ST (GAUZE/BANDAGES/DRESSINGS) ×2 IMPLANT
GAUZE PACKING IODOFORM 2 (PACKING) IMPLANT
GLOVE BIO SURGEON STRL SZ 6.5 (GLOVE) ×5 IMPLANT
GLOVE BIOGEL PI IND STRL 6.5 (GLOVE) ×3 IMPLANT
GLOVE BIOGEL PI IND STRL 7.0 (GLOVE) ×3 IMPLANT
GLOVE BIOGEL PI INDICATOR 6.5 (GLOVE) ×2
GLOVE BIOGEL PI INDICATOR 7.0 (GLOVE) ×2
GOWN STRL REUS W/TWL LRG LVL3 (GOWN DISPOSABLE) ×16 IMPLANT
NEEDLE HYPO 22GX1.5 SAFETY (NEEDLE) ×2 IMPLANT
NS IRRIG 1000ML POUR BTL (IV SOLUTION) ×3 IMPLANT
PACK VAGINAL WOMENS (CUSTOM PROCEDURE TRAY) ×3 IMPLANT
SUT CAPIO ETHIBPND (SUTURE) IMPLANT
SUT VIC AB 2-0 CT1 27 (SUTURE) ×9
SUT VIC AB 2-0 CT1 TAPERPNT 27 (SUTURE) ×3 IMPLANT
SUT VIC AB 2-0 UR5 27 (SUTURE) IMPLANT
SUT VIC AB 3-0 SH 27 (SUTURE) ×3
SUT VIC AB 3-0 SH 27X BRD (SUTURE) ×1 IMPLANT
SYRINGE 10CC LL (SYRINGE) IMPLANT
TOWEL OR 17X24 6PK STRL BLUE (TOWEL DISPOSABLE) ×6 IMPLANT
TRAY FOLEY CATH 14FR (SET/KITS/TRAYS/PACK) ×3 IMPLANT
WATER STERILE IRR 1000ML POUR (IV SOLUTION) ×3 IMPLANT

## 2014-01-03 NOTE — Transfer of Care (Signed)
Immediate Anesthesia Transfer of Care Note  Patient: [name]  Procedure(s) Performed: Procedure(s): ANTERIOR (CYSTOCELE) REPAIR (N/A)  Patient Location: PACU  Anesthesia Type:General  Level of Consciousness: awake, alert  and oriented  Airway & Oxygen Therapy: Patient Spontanous Breathing and Patient connected to face mask oxygen  Post-op Assessment: Report given to PACU RN and Patient moving all extremities  Post vital signs: Reviewed and stable  Complications: No apparent anesthesia complications

## 2014-01-03 NOTE — Addendum Note (Signed)
Addendum created 01/03/14 1642 by Flossie Dibble, CRNA   Modules edited: Notes Section   Notes Section:  File: [id number]

## 2014-01-03 NOTE — Anesthesia Postprocedure Evaluation (Signed)
  Anesthesia Post-op Note  Patient: Frances [name]  Procedure(s) Performed: Procedure(s): ANTERIOR (CYSTOCELE) REPAIR (N/A)  Patient Location: PACU  Anesthesia Type:General  Level of Consciousness: awake, alert  and oriented  Airway and Oxygen Therapy: Patient Spontanous Breathing  Post-op Pain: mild  Post-op Assessment: Post-op Vital signs reviewed, Patient's Cardiovascular Status Stable, Respiratory Function Stable, Patent Airway, No signs of Nausea or vomiting and Pain level controlled  Post-op Vital Signs: Reviewed and stable  Last Vitals:  Filed Vitals:   01/03/14 1230  BP: 140/70  Pulse: 80  Temp:   Resp: 20    Complications: No apparent anesthesia complications

## 2014-01-03 NOTE — Anesthesia Postprocedure Evaluation (Signed)
Anesthesia Post Note  Patient: Frances [name]  Procedure(s) Performed: Procedure(s): ANTERIOR (CYSTOCELE) REPAIR (N/A)  Anesthesia type: General  Patient location: Women's Unit  Post pain: Pain level controlled  Post assessment: Post-op Vital signs reviewed  Last Vitals: BP 150/70  Pulse 73  Temp(Src) 36.5 C (Oral)  Resp 17  Ht 5\' 4"  (1.626 m)  Wt 170 lb (77.111 kg)  BMI 29.17 kg/m2  SpO2 98%  Post vital signs: Reviewed  Level of consciousness: awake  Complications: No apparent anesthesia complications

## 2014-01-03 NOTE — Brief Op Note (Signed)
01/03/2014  12:05 PM  PATIENT:  Frances [name]  80 y.o. female  PRE-OPERATIVE DIAGNOSIS:  Cystocele  POST-OPERATIVE DIAGNOSIS:  Cystocele  PROCEDURE:  Procedure(s): ANTERIOR (CYSTOCELE) REPAIR (N/A)  SURGEON:  Surgeon(s) and Role:    * Osiah Haring A. Pamala Hurry, MD - Primary  PHYSICIAN ASSISTANT:   ASSISTANTS: Mel Almond, CNM   ANESTHESIA:   general  EBL:  Total I/O In: 1300 [I.V.:1300] Out: 320 [Urine:300; Blood:20]  BLOOD ADMINISTERED:none  DRAINS: Urinary Catheter (Foley)   LOCAL MEDICATIONS USED:  OTHER 1/2% Marcaine with 1-200,000 units epinephrine, 20 cc used in total  SPECIMEN:  No Specimen  DISPOSITION OF SPECIMEN:  N/A  COUNTS:  YES  TOURNIQUET:  * No tourniquets in log *  DICTATION: .Note written in EPIC  PLAN OF CARE: Admit for overnight observation  PATIENT DISPOSITION:  PACU - hemodynamically stable.   Delay start of Pharmacological VTE agent (>24hrs) due to surgical blood loss or risk of bleeding: yes

## 2014-01-03 NOTE — Op Note (Signed)
01/03/2014  12:05 PM  PATIENT:  Frances [name]  80 y.o. female  PRE-OPERATIVE DIAGNOSIS:  Cystocele  POST-OPERATIVE DIAGNOSIS:  Cystocele  PROCEDURE:  Procedure(s): ANTERIOR (CYSTOCELE) REPAIR (N/A)  SURGEON:  Surgeon(s) and Role:    * Frances Grullon A. Pamala Hurry, MD - Primary  PHYSICIAN ASSISTANT:   ASSISTANTS: Frances [name], CNM   ANESTHESIA:   general  EBL:  Total I/O In: 1300 [I.V.:1300] Out: 320 [Urine:300; Blood:20]  BLOOD ADMINISTERED:none  DRAINS: Urinary Catheter (Foley)   LOCAL MEDICATIONS USED:  OTHER 1/2% Marcaine with 1-200,000 units epinephrine, 20 cc used in total  SPECIMEN:  No Specimen  DISPOSITION OF SPECIMEN:  N/A  COUNTS:  YES  TOURNIQUET:  * No tourniquets in log *  DICTATION: .Note written in EPIC  PLAN OF CARE: Admit for overnight observation  PATIENT DISPOSITION:  PACU - hemodynamically stable.   Delay start of Pharmacological VTE agent (>24hrs) due to surgical blood loss or risk of bleeding: yes  Antibiotics: 2 g of Ancef Complications: None Findings: Grade 2 cystocele with good reduction at the end of the procedure, grade 1 rectocele which was not operated on, hemostasis post procedure Indications: 80 year old patient with persistent cystocele who has failed conservative measures here for cystocele repair  Procedure: After informed consent was obtained the patient was taken to the operating room where general anesthesia was initiated without difficulty she was prepped and draped in the normal sterile fashion in a high lithotomy position. Foley catheter was inserted into the bladder. Cystocele is evaluated in surgical mapping was carried out. A weighted speculum was placed in the vagina and an Allis clamp was placed at the most distal edge of the cystocele. Another Frances [name] was placed along the most proximal edge of the cystocele. Bovie cautery was used to score the vaginal mucosa in the midline. Injection was done in the vaginal mucosa to help  hydrodissect and for anesthesia and hemostasis. A scalpel was used to create an incision in the vaginal mucosa in the midline and Allis clamps were placed on the lateral edges of the vaginal mucosa. With retraction on the lateral edges and using a combination of Metzenbaum scissors and Frances [name] scissors the cystocele was dissected away from the vaginal mucosa. Once adequate dissection was done a 2-0 Vicryl was used in interrupted suture fashion to reapproximate the connective tissue. This led to good reduction of the cystocele. The vaginal mucosa edges were trimmed with the Mayo scissors and the vaginal mucosa was closed with 3-0 Vicryl. Good hemostasis was noted. No blood in the Foley. Decision was made to not carry out a cystoscopy as there was no concerns of bladder entry during the procedure. The vagina was packed with 1 inch gauze coated in Estrace cream after evaluating the rectocele deciding not to take any surgical intervention.  Sponge lap needle count was correct time 3 and patient is to the recovery room in stable condition  Frances [name] 01/03/2014 12:14 PM

## 2014-01-03 NOTE — Anesthesia Preprocedure Evaluation (Addendum)
Anesthesia Evaluation  Patient identified by MRN, date of birth, ID band Patient awake    Reviewed: Allergy & Precautions, H&P , Patient's Chart, lab work & pertinent test results, reviewed documented beta blocker date and time   History of Anesthesia Complications Negative for: history of anesthetic complications  Airway Mallampati: II TM Distance: >3 FB Neck ROM: full    Dental   Pulmonary asthma ,  breath sounds clear to auscultation        Cardiovascular Exercise Tolerance: Good hypertension, + Peripheral Vascular Disease + dysrhythmias Rhythm:regular Rate:Normal     Neuro/Psych negative psych ROS   GI/Hepatic   Endo/Other  Hypothyroidism   Renal/GU Renal disease     Musculoskeletal   Abdominal   Peds  Hematology   Anesthesia Other Findings This patient has A fib and was taken off anticoagulation for procedure (last dose of lovenox was yesterday morning)   Pre-op staff Called Cardiology office over the last two days to get records, but no response.  Patient is a good historian and states that she has no significant coronary artery disease and that her echo was "normal".  This, coupled with her good exercise tolerance, make me feel that proceeding without those records is a safe option.  Reproductive/Obstetrics                          Anesthesia Physical Anesthesia Plan  ASA: III  Anesthesia Plan: General LMA   Post-op Pain Management:    Induction:   Airway Management Planned:   Additional Equipment:   Intra-op Plan:   Post-operative Plan:   Informed Consent: I have reviewed the patients History and Physical, chart, labs and discussed the procedure including the risks, benefits and alternatives for the proposed anesthesia with the patient or authorized representative who has indicated his/her understanding and acceptance.   Dental Advisory Given  Plan Discussed with: CRNA,  Surgeon and Anesthesiologist  Anesthesia Plan Comments:         Anesthesia Quick Evaluation

## 2014-01-04 ENCOUNTER — Encounter (HOSPITAL_COMMUNITY): Payer: Self-pay | Admitting: Obstetrics

## 2014-01-04 LAB — PROTIME-INR
INR: 1.17 (ref 0.00–1.49)
Prothrombin Time: 14.7 seconds (ref 11.6–15.2)

## 2014-01-04 MED ORDER — DSS 100 MG PO CAPS: 100.0000 mg | ORAL_CAPSULE | Freq: Two times a day (BID) | ORAL | Status: DC

## 2014-01-04 NOTE — Progress Notes (Signed)
Late entry- pt seen 4/29 at 6:30 pm  8 hrs post-op. Foley in, no ambulation yet, tol re po  Pt notes no pain, no vaginal bleeding, no vaginal pressure, no complaints. Surgical findings reviewed.  Vitals reviewed- slight increase in sbp, good urine output Gen: well appearing, no distress Abd: soft, NT, ND GU: vaginal packed, minimal staining. LE: NT, no edema, SCD's in place  A/P: post-op day 0 s/p anterior repair, doing well.  Interval history: Per RN, pt did well o/n, no pain complaints, no bleeding. Coumadin resumed, Lovenox restarted 12 hrs post-op Vaginal packing removed this am, minimal staining.Foley removed, awaiting void.  Vitals stable  A/P: d/c home this am after void. Pt to resume both coumadin and lovenox. F/u Mon for INR check and if adequate witll d/c Lovenox.   Jafeth Mustin A. Nikesh Teschner 01/04/2014 8:44 AM

## 2014-01-04 NOTE — Progress Notes (Signed)
Pt discharged home with husband... Condition stable... No equipment... Ambulated to car with E. Kydd, RN. 

## 2014-01-04 NOTE — Discharge Summary (Signed)
Frances [name] MRN: [id number] DOB/AGE: [birth year] 80 y.o.  Admit date: 01/03/2014 Discharge date: 01/04/14  Admission Diagnoses: Cystocele  Discharge Diagnoses: Cystocele        Active Problems:   Cystocele   Discharged Condition: good  Hospital Course: admitted, surgery for cystocele repair, uncomplicated surgery, by 12 hrs post-op pt was doing well, w/o any need for pain meds, bleeding stable. Pt was restarted on Lovenox and Coumadin. Remained on all home meds. POD #1, vaginal packing and foley removed, continued on home meds, continued to have stable vitals and no complaints. After voiding, pt was d/c'd to home.   Consults: None  Treatments: surgery: cystocele repair  Disposition: 01-Home or Self Care   Future Appointments Provider Department Dept Phone   01/31/2014 11:00 AM [name], MD [place] [phone]       Medication List         calcium-vitamin D 500-200 MG-UNIT per tablet  Take 1 tablet by mouth daily at 12 noon.     diltiazem 180 MG 24 hr capsule  Commonly known as:  DILACOR XR  Take 180 mg by mouth daily.     docusate 50 MG/5ML liquid  Commonly known as:  COLACE  Take 10 mLs (100 mg total) by mouth 2 (two) times daily.     DSS 100 MG Caps  Take 100 mg by mouth 2 (two) times daily.     fish oil-omega-3 fatty acids 1000 MG capsule  Take 1 g by mouth daily.     flecainide 100 MG tablet  Commonly known as:  TAMBOCOR  Take 100 mg by mouth 2 (two) times daily.     hydrochlorothiazide 25 MG tablet  Commonly known as:  HYDRODIURIL  Take 25 mg by mouth daily.     levothyroxine 75 MCG tablet  Commonly known as:  SYNTHROID, LEVOTHROID  Take 75 mcg by mouth daily.     METAMUCIL PO  Take by mouth daily.     multivitamin tablet  Take 0.5 tablets by mouth 2 (two) times daily.     oxyCODONE-acetaminophen 5-325 MG per tablet  Commonly known as:  PERCOCET/ROXICET  Take 1-2 tablets by mouth every 4 (four) hours as needed for  severe pain (moderate to severe pain (when tolerating fluids)).     PROBIOTIC PO  Take by mouth daily.     ramipril 10 MG tablet  Commonly known as:  ALTACE  Take 10 mg by mouth daily.     vitamin C 500 MG tablet  Commonly known as:  ASCORBIC ACID  Take 500 mg by mouth 2 (two) times daily.     warfarin 3 MG tablet  Commonly known as:  COUMADIN  Take 3 mg by mouth daily.         Signed: Floyce Stakes. Pamala Hurry, MD 01/04/2014, 10:04 PM

## 2014-01-08 DIAGNOSIS — I4891 Unspecified atrial fibrillation: Secondary | ICD-10-CM | POA: Diagnosis not present

## 2014-01-08 DIAGNOSIS — I129 Hypertensive chronic kidney disease with stage 1 through stage 4 chronic kidney disease, or unspecified chronic kidney disease: Secondary | ICD-10-CM | POA: Diagnosis not present

## 2014-01-11 DIAGNOSIS — I4891 Unspecified atrial fibrillation: Secondary | ICD-10-CM | POA: Diagnosis not present

## 2014-01-11 DIAGNOSIS — Z7901 Long term (current) use of anticoagulants: Secondary | ICD-10-CM | POA: Diagnosis not present

## 2014-01-11 NOTE — Telephone Encounter (Signed)
Clearance was approved for surgery.Marland KitchenMarland KitchenMarland Kitchen

## 2014-01-14 ENCOUNTER — Encounter (HOSPITAL_COMMUNITY): Payer: Self-pay

## 2014-01-14 ENCOUNTER — Observation Stay (HOSPITAL_COMMUNITY)
Admission: AD | Admit: 2014-01-14 | Discharge: 2014-01-16 | Disposition: A | Discharge status: Home / Self Care | Payer: Medicare Other | Source: Ambulatory Visit | Attending: Obstetrics & Gynecology | Admitting: Obstetrics & Gynecology

## 2014-01-14 DIAGNOSIS — Y838 Other surgical procedures as the cause of abnormal reaction of the patient, or of later complication, without mention of misadventure at the time of the procedure: Secondary | ICD-10-CM | POA: Insufficient documentation

## 2014-01-14 DIAGNOSIS — N189 Chronic kidney disease, unspecified: Secondary | ICD-10-CM | POA: Insufficient documentation

## 2014-01-14 DIAGNOSIS — I4891 Unspecified atrial fibrillation: Secondary | ICD-10-CM | POA: Diagnosis not present

## 2014-01-14 DIAGNOSIS — I739 Peripheral vascular disease, unspecified: Secondary | ICD-10-CM | POA: Diagnosis not present

## 2014-01-14 DIAGNOSIS — IMO0002 Reserved for concepts with insufficient information to code with codable children: Principal | ICD-10-CM | POA: Diagnosis present

## 2014-01-14 DIAGNOSIS — I129 Hypertensive chronic kidney disease with stage 1 through stage 4 chronic kidney disease, or unspecified chronic kidney disease: Secondary | ICD-10-CM | POA: Diagnosis not present

## 2014-01-14 DIAGNOSIS — E039 Hypothyroidism, unspecified: Secondary | ICD-10-CM | POA: Insufficient documentation

## 2014-01-14 DIAGNOSIS — J45909 Unspecified asthma, uncomplicated: Secondary | ICD-10-CM | POA: Diagnosis not present

## 2014-01-14 LAB — CBC
HCT: 42.1 % (ref 36.0–46.0)
Hemoglobin: 14.4 g/dL (ref 12.0–15.0)
MCH: 29 pg (ref 26.0–34.0)
MCHC: 34.2 g/dL (ref 30.0–36.0)
MCV: 84.9 fL (ref 78.0–100.0)
Platelets: 196 10*3/uL (ref 150–400)
RBC: 4.96 MIL/uL (ref 3.87–5.11)
RDW: 14.8 % (ref 11.5–15.5)
WBC: 7.8 10*3/uL (ref 4.0–10.5)

## 2014-01-14 LAB — URINALYSIS, ROUTINE W REFLEX MICROSCOPIC
Bilirubin Urine: NEGATIVE
Glucose, UA: NEGATIVE mg/dL
Hgb urine dipstick: NEGATIVE
Ketones, ur: NEGATIVE mg/dL
LEUKOCYTES UA: NEGATIVE
NITRITE: NEGATIVE
PH: 6.5 (ref 5.0–8.0)
Protein, ur: NEGATIVE mg/dL
Specific Gravity, Urine: 1.01 (ref 1.005–1.030)
Urobilinogen, UA: 0.2 mg/dL (ref 0.0–1.0)

## 2014-01-14 NOTE — MAU Note (Signed)
Cystocele repair on 4/29. Had some vaginal spotting ever since surgery. On warfarin & lovenox for a fib. Vaginal bleeding dark red & increased since this afternoon. Denies pain.

## 2014-01-15 ENCOUNTER — Encounter (HOSPITAL_COMMUNITY): Payer: Medicare Other | Admitting: Anesthesiology

## 2014-01-15 ENCOUNTER — Observation Stay (HOSPITAL_COMMUNITY): Payer: Medicare Other | Admitting: Anesthesiology

## 2014-01-15 ENCOUNTER — Encounter (HOSPITAL_COMMUNITY): Payer: Self-pay | Admitting: *Deleted

## 2014-01-15 ENCOUNTER — Encounter (HOSPITAL_COMMUNITY)
Admission: AD | Disposition: A | Discharge status: Home / Self Care | Payer: Self-pay | Source: Ambulatory Visit | Attending: Obstetrics & Gynecology

## 2014-01-15 DIAGNOSIS — IMO0002 Reserved for concepts with insufficient information to code with codable children: Secondary | ICD-10-CM | POA: Diagnosis not present

## 2014-01-15 DIAGNOSIS — I10 Essential (primary) hypertension: Secondary | ICD-10-CM | POA: Diagnosis not present

## 2014-01-15 DIAGNOSIS — K648 Other hemorrhoids: Secondary | ICD-10-CM | POA: Diagnosis not present

## 2014-01-15 DIAGNOSIS — J45909 Unspecified asthma, uncomplicated: Secondary | ICD-10-CM | POA: Diagnosis not present

## 2014-01-15 DIAGNOSIS — I4891 Unspecified atrial fibrillation: Secondary | ICD-10-CM | POA: Diagnosis not present

## 2014-01-15 DIAGNOSIS — E039 Hypothyroidism, unspecified: Secondary | ICD-10-CM | POA: Diagnosis not present

## 2014-01-15 DIAGNOSIS — R58 Hemorrhage, not elsewhere classified: Secondary | ICD-10-CM | POA: Diagnosis not present

## 2014-01-15 HISTORY — PX: EXAMINATION UNDER ANESTHESIA: SHX1540

## 2014-01-15 LAB — PROTIME-INR
INR: 1.86 — AB (ref 0.00–1.49)
Prothrombin Time: 20.9 seconds — ABNORMAL HIGH (ref 11.6–15.2)

## 2014-01-15 LAB — SURGICAL PCR SCREEN
MRSA, PCR: NEGATIVE
STAPHYLOCOCCUS AUREUS: NEGATIVE

## 2014-01-15 SURGERY — EXAM UNDER ANESTHESIA
Anesthesia: Monitor Anesthesia Care | Site: Vagina

## 2014-01-15 MED ORDER — PROPOFOL 10 MG/ML IV EMUL
INTRAVENOUS | Status: AC
  Filled 2014-01-15: qty 20

## 2014-01-15 MED ORDER — RAMIPRIL 5 MG PO CAPS
10.0000 mg | ORAL_CAPSULE | Freq: Every day | ORAL | Status: DC
  Filled 2014-01-15: qty 2

## 2014-01-15 MED ORDER — FENTANYL CITRATE 0.05 MG/ML IJ SOLN
INTRAMUSCULAR | Status: DC | PRN
  Administered 2014-01-15 (×4): 50 ug via INTRAVENOUS

## 2014-01-15 MED ORDER — DOCUSATE SODIUM 50 MG/5ML PO LIQD
100.0000 mg | Freq: Two times a day (BID) | ORAL | Status: DC
  Filled 2014-01-15 (×3): qty 10

## 2014-01-15 MED ORDER — CEFAZOLIN SODIUM-DEXTROSE 2-3 GM-% IV SOLR
INTRAVENOUS | Status: AC
  Filled 2014-01-15: qty 50

## 2014-01-15 MED ORDER — ONDANSETRON HCL 4 MG/2ML IJ SOLN: 4.0000 mg | Freq: Once | INTRAMUSCULAR | Status: AC | PRN

## 2014-01-15 MED ORDER — FLECAINIDE ACETATE 100 MG PO TABS
100.0000 mg | ORAL_TABLET | Freq: Two times a day (BID) | ORAL | Status: DC
  Administered 2014-01-15 (×2): 100 mg via ORAL
  Filled 2014-01-15 (×5): qty 1

## 2014-01-15 MED ORDER — DILTIAZEM HCL ER 180 MG PO CP24
180.0000 mg | ORAL_CAPSULE | Freq: Every day | ORAL | Status: DC
  Administered 2014-01-15: 180 mg via ORAL
  Filled 2014-01-15 (×3): qty 1

## 2014-01-15 MED ORDER — ESTRADIOL 0.1 MG/GM VA CREA
TOPICAL_CREAM | VAGINAL | Status: AC
  Filled 2014-01-15: qty 42.5

## 2014-01-15 MED ORDER — ESTRADIOL 0.1 MG/GM VA CREA
TOPICAL_CREAM | VAGINAL | Status: DC | PRN
  Administered 2014-01-15: 1 via VAGINAL

## 2014-01-15 MED ORDER — FENTANYL CITRATE 0.05 MG/ML IJ SOLN
INTRAMUSCULAR | Status: AC
  Administered 2014-01-15: 50 ug via INTRAVENOUS
  Filled 2014-01-15: qty 2

## 2014-01-15 MED ORDER — DEXAMETHASONE SODIUM PHOSPHATE 10 MG/ML IJ SOLN
INTRAMUSCULAR | Status: AC
Start: 2014-01-15 — End: 2014-01-15
  Filled 2014-01-15: qty 1

## 2014-01-15 MED ORDER — FENTANYL CITRATE 0.05 MG/ML IJ SOLN
25.0000 ug | INTRAMUSCULAR | Status: DC | PRN
  Administered 2014-01-15 (×2): 50 ug via INTRAVENOUS

## 2014-01-15 MED ORDER — RAMIPRIL 10 MG PO CAPS
10.0000 mg | ORAL_CAPSULE | Freq: Every day | ORAL | Status: DC
  Administered 2014-01-15: 10 mg via ORAL
  Filled 2014-01-15 (×3): qty 1

## 2014-01-15 MED ORDER — ACETAMINOPHEN 160 MG/5ML PO SOLN
325.0000 mg | ORAL | Status: DC | PRN
  Filled 2014-01-15: qty 20.3

## 2014-01-15 MED ORDER — FLECAINIDE ACETATE 100 MG PO TABS
100.0000 mg | ORAL_TABLET | Freq: Two times a day (BID) | ORAL | Status: DC
  Filled 2014-01-15: qty 1

## 2014-01-15 MED ORDER — OXYCODONE-ACETAMINOPHEN 5-325 MG PO TABS
1.0000 | ORAL_TABLET | ORAL | Status: DC | PRN
  Administered 2014-01-15 – 2014-01-16 (×2): 1 via ORAL
  Filled 2014-01-15 (×2): qty 1

## 2014-01-15 MED ORDER — LIDOCAINE HCL (CARDIAC) 20 MG/ML IV SOLN
INTRAVENOUS | Status: DC | PRN
  Administered 2014-01-15: 40 mg via INTRAVENOUS

## 2014-01-15 MED ORDER — HYDROCHLOROTHIAZIDE 25 MG PO TABS
25.0000 mg | ORAL_TABLET | Freq: Every day | ORAL | Status: DC
  Administered 2014-01-15: 25 mg via ORAL
  Filled 2014-01-15 (×3): qty 1

## 2014-01-15 MED ORDER — HYDROMORPHONE HCL PF 1 MG/ML IJ SOLN: 0.5000 mg | INTRAMUSCULAR | Status: DC | PRN

## 2014-01-15 MED ORDER — LEVOTHYROXINE SODIUM 75 MCG PO TABS
75.0000 ug | ORAL_TABLET | Freq: Every day | ORAL | Status: DC
  Administered 2014-01-16: 75 ug via ORAL
  Filled 2014-01-15 (×3): qty 1

## 2014-01-15 MED ORDER — ACETAMINOPHEN 325 MG PO TABS: 325.0000 mg | ORAL_TABLET | ORAL | Status: DC | PRN

## 2014-01-15 MED ORDER — LACTATED RINGERS IV SOLN
INTRAVENOUS | Status: DC | PRN
  Administered 2014-01-15 (×2): via INTRAVENOUS

## 2014-01-15 MED ORDER — FENTANYL CITRATE 0.05 MG/ML IJ SOLN
INTRAMUSCULAR | Status: AC
  Filled 2014-01-15: qty 5

## 2014-01-15 MED ORDER — PROPOFOL INFUSION 10 MG/ML OPTIME
INTRAVENOUS | Status: DC | PRN
  Administered 2014-01-15: 100 ug/kg/min via INTRAVENOUS

## 2014-01-15 MED ORDER — SILVER NITRATE-POT NITRATE 75-25 % EX MISC
10.0000 | CUTANEOUS | Status: DC | PRN
  Filled 2014-01-15: qty 10

## 2014-01-15 MED ORDER — LIDOCAINE HCL (CARDIAC) 20 MG/ML IV SOLN
INTRAVENOUS | Status: AC
  Filled 2014-01-15: qty 5

## 2014-01-15 MED ORDER — ONDANSETRON HCL 4 MG/2ML IJ SOLN
INTRAMUSCULAR | Status: AC
  Filled 2014-01-15: qty 2

## 2014-01-15 MED ORDER — DOCUSATE SODIUM 100 MG PO CAPS
100.0000 mg | ORAL_CAPSULE | Freq: Two times a day (BID) | ORAL | Status: DC
  Administered 2014-01-15 (×2): 100 mg via ORAL
  Filled 2014-01-15 (×3): qty 1

## 2014-01-15 MED ORDER — HYDROCHLOROTHIAZIDE 25 MG PO TABS
25.0000 mg | ORAL_TABLET | Freq: Every day | ORAL | Status: DC
  Filled 2014-01-15: qty 1

## 2014-01-15 MED ORDER — DILTIAZEM HCL ER 180 MG PO CP24
180.0000 mg | ORAL_CAPSULE | Freq: Every day | ORAL | Status: DC
  Filled 2014-01-15: qty 1

## 2014-01-15 SURGICAL SUPPLY — 16 items
GAUZE PACKING 2X5 YD STRL (GAUZE/BANDAGES/DRESSINGS) ×1 IMPLANT
GLOVE BIO SURGEON STRL SZ 6.5 (GLOVE) ×1 IMPLANT
GLOVE BIO SURGEON STRL SZ7 (GLOVE) ×3 IMPLANT
GLOVE BIOGEL PI IND STRL 7.0 (GLOVE) IMPLANT
GLOVE BIOGEL PI INDICATOR 7.0 (GLOVE) ×2
GLOVE SURG SS PI 7.0 STRL IVOR (GLOVE) ×2 IMPLANT
GOWN STRL REUS W/TWL LRG LVL3 (GOWN DISPOSABLE) ×4 IMPLANT
NS IRRIG 1000ML POUR BTL (IV SOLUTION) ×1 IMPLANT
PACK VAGINAL WOMENS (CUSTOM PROCEDURE TRAY) ×1 IMPLANT
PAD OB MATERNITY 4.3X12.25 (PERSONAL CARE ITEMS) ×1 IMPLANT
SPONGE SURGIFOAM ABS GEL 12-7 (HEMOSTASIS) ×1 IMPLANT
SUT VIC AB 3-0 SH 27 (SUTURE) ×2
SUT VIC AB 3-0 SH 27XBRD (SUTURE) IMPLANT
SYR BULB IRRIGATION 50ML (SYRINGE) ×1 IMPLANT
TOWEL OR 17X26 10 PK STRL BLUE (TOWEL DISPOSABLE) ×1 IMPLANT
TRAY FOLEY CATH 14FR (SET/KITS/TRAYS/PACK) ×1 IMPLANT

## 2014-01-15 NOTE — Anesthesia Preprocedure Evaluation (Addendum)
Anesthesia Evaluation  Patient identified by MRN, date of birth, ID band Patient awake    Reviewed: Allergy & Precautions, H&P , Patient's Chart, lab work & pertinent test results, reviewed documented beta blocker date and time   History of Anesthesia Complications (+) history of anesthetic complications (sore roof of mouth after anteroir repair under GA last week)  Airway Mallampati: I TM Distance: >3 FB Neck ROM: full    Dental   Pulmonary asthma ,  breath sounds clear to auscultation        Cardiovascular Exercise Tolerance: Good hypertension, + Peripheral Vascular Disease + dysrhythmias (a-fib.  on coumadin/lovenox (restarting coumadin since surgery 10 days ago).  INR 1.86 today) Atrial Fibrillation Rhythm:regular Rate:Normal     Neuro/Psych Bilateral hearing aids negative psych ROS   GI/Hepatic   Endo/Other  Hypothyroidism   Renal/GU Renal disease (cr 0.86)   Bleeding/hematoma post-op s/p anterior repair 10 days ago.  On coumadin    Musculoskeletal   Abdominal   Peds  Hematology   Anesthesia Other Findings NPO since before midnight  Reproductive/Obstetrics                         Anesthesia Physical  Anesthesia Plan  ASA: III and emergent  Anesthesia Plan: MAC   Post-op Pain Management:    Induction:   Airway Management Planned:   Additional Equipment:   Intra-op Plan:   Post-operative Plan:   Informed Consent: I have reviewed the patients History and Physical, chart, labs and discussed the procedure including the risks, benefits and alternatives for the proposed anesthesia with the patient or authorized representative who has indicated his/her understanding and acceptance.   Dental Advisory Given  Plan Discussed with: CRNA, Surgeon and Anesthesiologist  Anesthesia Plan Comments:         Anesthesia Quick Evaluation

## 2014-01-15 NOTE — Op Note (Signed)
01/14/2014 - 01/15/2014  1:05 PM  PATIENT:  Frances [name]  80 y.o. female  PRE-OPERATIVE DIAGNOSIS:  Vaginal Bleeding, hematoma of cystocele repair  POST-OPERATIVE DIAGNOSIS: Vaginal Bleeding, hematoma of cystocele repair   PROCEDURE:  Procedure(s): EXAM UNDER ANESTHESIA with evacuation of hematoma and over sewing of vaginal mucosa. (N/A)  SURGEON:  Surgeon(s) and Role:    * Elveria Royals, MD - Assisting    Claiborne Billings A. Pamala Hurry, MD - Primary  PHYSICIAN ASSISTANT:   ASSISTANTS: Mody   ANESTHESIA:   IV sedation  EBL:  Total I/O In: 1150 [I.V.:1150] Out: -   BLOOD ADMINISTERED:none  DRAINS: Urinary Catheter (Foley)   LOCAL MEDICATIONS USED:  NONE  SPECIMEN:  No Specimen  DISPOSITION OF SPECIMEN:  N/A  COUNTS:  YES  TOURNIQUET:  * No tourniquets in log *  DICTATION: .Note written in EPIC  PLAN OF CARE: Admit for overnight observation  PATIENT DISPOSITION:  PACU - hemodynamically stable.   Delay start of Pharmacological VTE agent (>24hrs) due to surgical blood loss or risk of bleeding: yes  Indications: post-op day 10 with vaginal bleeding and hematoma below suture line of cystocele repair EBL: minimal Abx: 2g Ancef Complication, none  Procedure: informed consent obtained. Pt taken to OR where MAC was given. Prepped and draped in normal sterile fashion in high lithotomy. Vaginal packing removed and foley catheter placed. Posterior vaginal wall retractor placed. Hematoma evaluated. No active bleeding. Bruising of vaginal mucosa, clot behind suture line. Distal cystocele repair intact with healthy vaginal mucosa. Anterior cystocele repair opened- suture cut and edges spread open. Irrigation and suction used to clear clot. No active bleeding. Pressure held, still no bleeding. Gelfoam placed under suture line. Vaginal mucosa re-approximated with 3-0 vicryl on a SH needle. Interrupted figure of 8 sutures done. Sutures placed lateral to the bruised mucosal edges to  incorporate healthy tissue. Hemostasis still noted and vagina packed with estrace soaked gauze. Pt tolerated well.  Counts correct.  Scotti Motter A. Raynette Arras 01/15/2014 1:14 PM

## 2014-01-15 NOTE — Progress Notes (Signed)
Pt taken to OR via stretcher

## 2014-01-15 NOTE — MAU Provider Note (Signed)
History     CSN: [id number]  Arrival date and time: 01/14/14 2316 Provider notified: 01/14/2014 2355 Provider on unit: 0010 Provider at bedside: [place]  Chief Complaint  Patient presents with  . Post-op Problem    cystocele repair 01/03/13  . Vaginal Bleeding   HPI Pt seen and examined in MAU with on call [place] [name] is a 80 yo G2P2002 female presenting tonight with heavy vaginal bleeding.  She had a cystocele repair by Dr. Pamala Hurry on 01/03/14. She has been on Lovenox and Coumadin since the surgery with no complications and Coumadin was increased to 4 mg on 01/11/14 and she is to see PCP tomorrow for PT/INR.  She reports spotting that started about 1900, but got heavier since 2030 but has been spotting off and on since surgery.  She denies pain/lifting/ straining. She is using vaginal estrogen digitally only three time per week.   Past Medical History  Diagnosis Date  . Thyroid disease     hypothyroidism  . Atrial fibrillation   . Internal hemorrhoid   . Rectal bleeding   . SVD (spontaneous vaginal delivery) 1957, 1959    x 2  . Peripheral vascular disease     right arm and right shoulder blood clots r/t a fall  . Hypothyroidism   . Hypertension     controlled with meds  . Dysrhythmia     Hx - a-fib 05/2010 - tx with meds, no problem since 05/2010  . Chronic kidney disease     stage 3 renal disease - no med  . Arthritis     hands, back  . Hearing loss     bilateral hearing aids    Past Surgical History  Procedure Laterality Date  . Tonsillectomy    . Appendectomy    . Cystocele repair    . Joint replacement      left thumb replacement  . Colonoscopy    . Hand surgery      left  . Hemorrhoid injection  04/2011  . Dental surgery      infected tooth - general  . Dilation and curettage of uterus      x several  . Abdominal hysterectomy      and right ovary  . Back surgery      x 3 - 2 rods and 8 screws  . Anterior and posterior repair N/A  01/03/2014    Procedure: ANTERIOR (CYSTOCELE) REPAIR;  Surgeon: Floyce Stakes. Pamala Hurry, MD;  Location: [facility];  Service: Gynecology;  Laterality: N/A;    Family History  Problem Relation Age of Onset  . Heart disease Mother   . Hypertension Father   . Diabetes Father   . Heart disease Sister   . Hypertension Sister   . Heart disease Brother   . Heart attack Mother   . Heart attack Sister   . Heart failure Brother   . Sudden death Maternal Grandfather   . Stroke Father     History  Substance Use Topics  . Smoking status: Never Smoker   . Smokeless tobacco: Never Used  . Alcohol Use: No    Allergies:  Allergies  Allergen Reactions  . Codeine Nausea And Vomiting  . Pneumovax 23 [Pneumococcal Vac Polyvalent] Other (See Comments)    Arm swelling & fever  . Cephalexin Anxiety  . Sudafed [Pseudoephedrine Hcl] Anxiety  . Sulfa Antibiotics Rash    Prescriptions prior to admission  Medication Sig Dispense Refill  . Calcium Carbonate-Vitamin D (  CALCIUM-VITAMIN D) 500-200 MG-UNIT per tablet Take 1 tablet by mouth daily at 12 noon.       . diltiazem (DILACOR XR) 180 MG 24 hr capsule Take 180 mg by mouth daily.      Marland Kitchen docusate (COLACE) 50 MG/5ML liquid Take 10 mLs (100 mg total) by mouth 2 (two) times daily.  100 mL  0  . enoxaparin (LOVENOX) 40 MG/0.4ML injection Inject into the skin daily.      . fish oil-omega-3 fatty acids 1000 MG capsule Take 1 g by mouth daily.       . flecainide (TAMBOCOR) 100 MG tablet Take 100 mg by mouth 2 (two) times daily.        . hydrochlorothiazide (HYDRODIURIL) 25 MG tablet Take 25 mg by mouth daily.      Marland Kitchen levothyroxine (SYNTHROID, LEVOTHROID) 75 MCG tablet Take 75 mcg by mouth daily.        . Multiple Vitamin (MULTIVITAMIN) tablet Take 0.5 tablets by mouth 2 (two) times daily.       Marland Kitchen oxyCODONE-acetaminophen (PERCOCET/ROXICET) 5-325 MG per tablet Take 1-2 tablets by mouth every 4 (four) hours as needed for severe pain (moderate to severe pain (when  tolerating fluids)).  30 tablet  0  . Probiotic Product (PROBIOTIC PO) Take by mouth daily.        . Psyllium (METAMUCIL PO) Take by mouth daily.        . ramipril (ALTACE) 10 MG tablet Take 10 mg by mouth daily.        . vitamin C (ASCORBIC ACID) 500 MG tablet Take 500 mg by mouth 2 (two) times daily.       Marland Kitchen warfarin (COUMADIN) 3 MG tablet Take 4 mg by mouth daily.         Review of Systems  Constitutional: Negative.   HENT: Negative.   Eyes: Negative.   Respiratory: Negative.   Cardiovascular: Negative.   Gastrointestinal: Negative.   Genitourinary:       Heavy vaginal bleeding since about 2030; has soaked 4 pads since that time  Musculoskeletal: Negative.   Skin: Negative.   Neurological: Negative.   Endo/Heme/Allergies: Negative.   Psychiatric/Behavioral: Negative.    Results for orders placed during the hospital encounter of 01/14/14 (from the past 24 hour(s))  CBC     Status: None   Collection Time    01/14/14 11:30 PM      Result Value Ref Range   WBC 7.8  4.0 - 10.5 K/uL   RBC 4.96  3.87 - 5.11 MIL/uL   Hemoglobin 14.4  12.0 - 15.0 g/dL   HCT 42.1  36.0 - 46.0 %   MCV 84.9  78.0 - 100.0 fL   MCH 29.0  26.0 - 34.0 pg   MCHC 34.2  30.0 - 36.0 g/dL   RDW 14.8  11.5 - 15.5 %   Platelets 196  150 - 400 K/uL  URINALYSIS, ROUTINE W REFLEX MICROSCOPIC     Status: None   Collection Time    01/14/14 11:50 PM      Result Value Ref Range   Color, Urine YELLOW  YELLOW   APPearance CLEAR  CLEAR   Specific Gravity, Urine 1.010  1.005 - 1.030   pH 6.5  5.0 - 8.0   Glucose, UA NEGATIVE  NEGATIVE mg/dL   Hgb urine dipstick NEGATIVE  NEGATIVE   Bilirubin Urine NEGATIVE  NEGATIVE   Ketones, ur NEGATIVE  NEGATIVE mg/dL   Protein, ur NEGATIVE  NEGATIVE mg/dL   Urobilinogen, UA 0.2  0.0 - 1.0 mg/dL   Nitrite NEGATIVE  NEGATIVE   Leukocytes, UA NEGATIVE  NEGATIVE   Physical Exam   Blood pressure 115/76, pulse 85, height 5\' 5"  (1.651 m), weight 74.934 kg (165 lb 3.2  oz).  Physical Exam  Constitutional: She is oriented to person, place, and time. She appears well-developed and well-nourished.  HENT:  Head: Normocephalic.  Eyes: Conjunctivae are normal. Pupils are equal, round, and reactive to light.  Neck: Normal range of motion. Neck supple.  Cardiovascular: Normal rate and normal heart sounds.   H/O A.Fib  Respiratory: Effort normal and breath sounds normal.  GI: Soft. Bowel sounds are normal.  Genitourinary:  Clear plastic sterile speculum used. Hematoma and bruising seen around in anterior vaginal wall in cystocele repair sire. Sutures are intact and bleeding is noted from in b/w stitches that increased when hematoma was pushed.. 1 inch plain vaginal tape was packed in the vagina carefully for gentle pressure.   Musculoskeletal:  Limited due to multiple back surgeries  Neurological: She is alert and oriented to person, place, and time.  Skin: Skin is warm and dry.  Psychiatric: She has a normal mood and affect. Her behavior is normal. Judgment and thought content normal.    MAU Course  Procedures CCUA - catheterized CBC- normal , PT/INR- pending  Orthostatic VS normal  Speculum exam and vaginal packing--> Clear plastic sterile speculum used. Hematoma and bruising seen around in anterior vaginal wall in cystocele repair sire. Sutures are intact and bleeding is noted from in b/w stitches that increased when hematoma was pushed.. 1 inch plain vaginal tape was packed in the vagina carefully for gentle pressure.   Assessment and Plan  80 yo G2P2002 female, S/P Cystocele Repair on full anticoagulation with Coumadin bridging with Lovenox. Postoperative vaginal bleeding in cystocele repair site with hematoma.  H/O Atrial Fibrillation - currently on Coumadin and Lovenox  Plan to admit for observation with vaginal pressure with pack. Remove pack in 6-8 hrs and reassess for need to bring back to OR for drainage of hematoma and possible repacking.  Ok  for AM meds and clear diet until 4 am and then NPO.   Reviewed findings and plan with patient and her husband, both understand and agree.   V.Mykah Bellomo, MD

## 2014-01-15 NOTE — Brief Op Note (Signed)
01/14/2014 - 01/15/2014  1:05 PM  PATIENT:  Frances [name]  80 y.o. female  PRE-OPERATIVE DIAGNOSIS:  Vaginal Bleeding, hematoma of cystocele repair  POST-OPERATIVE DIAGNOSIS: Vaginal Bleeding, hematoma of cystocele repair   PROCEDURE:  Procedure(s): EXAM UNDER ANESTHESIA with evacuation of hematoma and over sewing of vaginal mucosa. (N/A)  SURGEON:  Surgeon(s) and Role:    * Elveria Royals, MD - Assisting    Claiborne Billings A. Pamala Hurry, MD - Primary  PHYSICIAN ASSISTANT:   ASSISTANTS: Mody   ANESTHESIA:   IV sedation  EBL:  Total I/O In: 1150 [I.V.:1150] Out: -   BLOOD ADMINISTERED:none  DRAINS: Urinary Catheter (Foley)   LOCAL MEDICATIONS USED:  NONE  SPECIMEN:  No Specimen  DISPOSITION OF SPECIMEN:  N/A  COUNTS:  YES  TOURNIQUET:  * No tourniquets in log *  DICTATION: .Note written in EPIC  PLAN OF CARE: Admit for overnight observation  PATIENT DISPOSITION:  PACU - hemodynamically stable.   Delay start of Pharmacological VTE agent (>24hrs) due to surgical blood loss or risk of bleeding: yes

## 2014-01-15 NOTE — Transfer of Care (Signed)
Immediate Anesthesia Transfer of Care Note  Patient: Frances [name]  Procedure(s) Performed: Procedure(s): EXAM UNDER ANESTHESIA with evacuation of hematoma and over sewing of vaginal mucosa. (N/A)  Patient Location: PACU  Anesthesia Type:MAC  Level of Consciousness: awake, alert  and oriented  Airway & Oxygen Therapy: Patient Spontanous Breathing and Patient connected to nasal cannula oxygen  Post-op Assessment: Report given to PACU RN, Post -op Vital signs reviewed and stable and Patient moving all extremities X 4  Post vital signs: Reviewed and stable  Complications: No apparent anesthesia complications

## 2014-01-15 NOTE — Progress Notes (Signed)
Ur chart review completed.  

## 2014-01-15 NOTE — H&P (Signed)
Frances [name] is a 80 yo G2P2002 female presenting tonight with heavy vaginal bleeding.  She had a cystocele repair by Dr. Pamala Hurry on 01/03/14. She has been on Lovenox and Coumadin since the surgery with no complications and Coumadin was increased to 4 mg on 01/11/14 and she is to see PCP tomorrow for PT/INR.  She reports spotting that started about 1900, but got heavier since 2030 but has been spotting off and on since surgery.  She denies pain/lifting/ straining. She is using vaginal estrogen digitally only three time per week. Vaginal pack was placed overnight and is bleeding through it, hence we plan to move to OR to reassess.        . Thyroid disease     hypothyroidism  . Atrial fibrillation   . Internal hemorrhoid   . Rectal bleeding   . SVD (spontaneous vaginal delivery) 1957, 1959    x 2  . Peripheral vascular disease     right arm and right shoulder blood clots r/t a fall  . Hypothyroidism   . Hypertension     controlled with meds  . Dysrhythmia     Hx - a-fib 05/2010 - tx with meds, no problem since 05/2010  . Chronic kidney disease     stage 3 renal disease - no med  . Arthritis     hands, back  . Hearing loss     bilateral hearing aids    Past Surgical History  Procedure Laterality Date  . Tonsillectomy    . Appendectomy    . Cystocele repair    . Joint replacement      left thumb replacement  . Colonoscopy    . Hand surgery      left  . Hemorrhoid injection  04/2011  . Dental surgery      infected tooth - general  . Dilation and curettage of uterus      x several  . Abdominal hysterectomy      and right ovary  . Back surgery      x 3 - 2 rods and 8 screws  . Anterior and posterior repair N/A 01/03/2014    Procedure: ANTERIOR (CYSTOCELE) REPAIR;  Surgeon: Floyce Stakes. Pamala Hurry, MD;  Location: [facility];  Service: Gynecology;  Laterality: N/A;    Family History  Problem Relation Age of Onset  . Heart disease Mother   . Hypertension Father   . Diabetes  Father   . Heart disease Sister   . Hypertension Sister   . Heart disease Brother   . Heart attack Mother   . Heart attack Sister   . Heart failure Brother   . Sudden death Maternal Grandfather   . Stroke Father     History  Substance Use Topics  . Smoking status: Never Smoker   . Smokeless tobacco: Never Used  . Alcohol Use: No    Allergies:  Allergies  Allergen Reactions  . Codeine Nausea And Vomiting  . Pneumovax 23 [Pneumococcal Vac Polyvalent] Other (See Comments)    Arm swelling & fever  . Cephalexin Anxiety  . Sudafed [Pseudoephedrine Hcl] Anxiety  . Sulfa Antibiotics Rash    Prescriptions prior to admission  Medication Sig Dispense Refill  . Calcium Carbonate-Vitamin D (CALCIUM-VITAMIN D) 500-200 MG-UNIT per tablet Take 1 tablet by mouth daily at 12 noon.       . diltiazem (DILACOR XR) 180 MG 24 hr capsule Take 180 mg by mouth daily.      Marland Kitchen docusate (COLACE)  50 MG/5ML liquid Take 10 mLs (100 mg total) by mouth 2 (two) times daily.  100 mL  0  . enoxaparin (LOVENOX) 80 MG/0.8ML injection Inject 80 mg into the skin.      . fish oil-omega-3 fatty acids 1000 MG capsule Take 1 g by mouth daily.       . flecainide (TAMBOCOR) 100 MG tablet Take 100 mg by mouth 2 (two) times daily.        . hydrochlorothiazide (HYDRODIURIL) 25 MG tablet Take 25 mg by mouth daily.      Marland Kitchen levothyroxine (SYNTHROID, LEVOTHROID) 75 MCG tablet Take 75 mcg by mouth daily.        . Multiple Vitamin (MULTIVITAMIN) tablet Take 0.5 tablets by mouth 2 (two) times daily.       Marland Kitchen oxyCODONE-acetaminophen (PERCOCET/ROXICET) 5-325 MG per tablet Take 1-2 tablets by mouth every 4 (four) hours as needed for severe pain (moderate to severe pain (when tolerating fluids)).  30 tablet  0  . Probiotic Product (PROBIOTIC PO) Take by mouth daily.        . Psyllium (METAMUCIL PO) Take by mouth daily.        . ramipril (ALTACE) 10 MG tablet Take 10 mg by mouth daily.        . vitamin C (ASCORBIC ACID) 500 MG tablet  Take 500 mg by mouth 2 (two) times daily.       Marland Kitchen warfarin (COUMADIN) 3 MG tablet Take 4 mg by mouth daily.         Review of Systems  Constitutional: Negative.   HENT: Negative.   Eyes: Negative.   Respiratory: Negative.   Cardiovascular: Negative.   Gastrointestinal: Negative.   Genitourinary: Positive for vaginal bleeding.       Heavy vaginal bleeding since about 2030; has soaked 4 pads since that time  Musculoskeletal: Negative.   Skin: Negative.   Neurological: Negative.   Endo/Heme/Allergies: Negative.   Psychiatric/Behavioral: Negative.    Results for orders placed during the hospital encounter of 01/14/14 (from the past 24 hour(s))  CBC     Status: None   Collection Time    01/14/14 11:30 PM      Result Value Ref Range   WBC 7.8  4.0 - 10.5 K/uL   RBC 4.96  3.87 - 5.11 MIL/uL   Hemoglobin 14.4  12.0 - 15.0 g/dL   HCT 42.1  36.0 - 46.0 %   MCV 84.9  78.0 - 100.0 fL   MCH 29.0  26.0 - 34.0 pg   MCHC 34.2  30.0 - 36.0 g/dL   RDW 14.8  11.5 - 15.5 %   Platelets 196  150 - 400 K/uL  URINALYSIS, ROUTINE W REFLEX MICROSCOPIC     Status: None   Collection Time    01/14/14 11:50 PM      Result Value Ref Range   Color, Urine YELLOW  YELLOW   APPearance CLEAR  CLEAR   Specific Gravity, Urine 1.010  1.005 - 1.030   pH 6.5  5.0 - 8.0   Glucose, UA NEGATIVE  NEGATIVE mg/dL   Hgb urine dipstick NEGATIVE  NEGATIVE   Bilirubin Urine NEGATIVE  NEGATIVE   Ketones, ur NEGATIVE  NEGATIVE mg/dL   Protein, ur NEGATIVE  NEGATIVE mg/dL   Urobilinogen, UA 0.2  0.0 - 1.0 mg/dL   Nitrite NEGATIVE  NEGATIVE   Leukocytes, UA NEGATIVE  NEGATIVE  PROTIME-INR     Status: Abnormal   Collection Time  01/15/14  1:10 AM      Result Value Ref Range   Prothrombin Time 20.9 (*) 11.6 - 15.2 seconds   INR 1.86 (*) 0.00 - 1.49   Physical Exam   BP 137/66  Pulse 63  Temp(Src) 98.3 F (36.8 C) (Oral)  Resp 18  Ht 5\' 5"  (1.651 m)  Wt 165 lb 3.2 oz (74.934 kg)  BMI 27.49 kg/m2  SpO2  98%  Physical Exam  Constitutional: She is oriented to person, place, and time. She appears well-developed and well-nourished.  HENT:  Head: Normocephalic.  Eyes: Conjunctivae are normal. Pupils are equal, round, and reactive to light.  Neck: Normal range of motion. Neck supple.  Cardiovascular: Normal rate and normal heart sounds.   H/O A.Fib  Respiratory: Effort normal and breath sounds normal.  GI: Soft. Bowel sounds are normal.  Genitourinary:  Clear plastic sterile speculum used. Hematoma and bruising seen around in anterior vaginal wall in cystocele repair sire. Sutures are intact and bleeding is noted from in b/w stitches that increased when hematoma was pushed.. 1 inch plain vaginal tape was packed in the vagina carefully for gentle pressure.   Musculoskeletal:  Limited due to multiple back surgeries  Neurological: She is alert and oriented to person, place, and time.  Skin: Skin is warm and dry.  Psychiatric: She has a normal mood and affect. Her behavior is normal. Judgment and thought content normal.    MAU Course  Procedures CCUA - catheterized CBC- normal , PT/INR- pending  Orthostatic VS normal  Speculum exam and vaginal packing--> Clear plastic sterile speculum used. Hematoma and bruising seen around in anterior vaginal wall in cystocele repair sire. Sutures are intact and bleeding is noted from in b/w stitches that increased when hematoma was pushed.. 1 inch plain vaginal tape was packed in the vagina carefully for gentle pressure.   Assessment and Plan  79 yo G2P2002 female, S/P Cystocele Repair on full anticoagulation with Coumadin bridging with Lovenox. Postoperative vaginal bleeding in cystocele repair site with hematoma.  H/O Atrial Fibrillation - currently on Coumadin and Lovenox  Evaluation under anesthesia.  Risks/complications of surgery reviewed incl infection, bleeding, damage to internal organs including bladder, bowels, ureters, blood vessels, other risks  from anesthesia, VTE and delayed complications of any surgery, complications in future surgery reviewed.   V.Valari Taylor, MD

## 2014-01-15 NOTE — Anesthesia Postprocedure Evaluation (Signed)
  Anesthesia Post-op Note  Patient: Frances [name]  Procedure(s) Performed: Procedure(s): EXAM UNDER ANESTHESIA with evacuation of hematoma and over sewing of vaginal mucosa. (N/A) Patient is awake and responsive. Pain and nausea are reasonably well controlled. Vital signs are stable and clinically acceptable. Oxygen saturation is clinically acceptable. There are no apparent anesthetic complications at this time. Patient is ready for discharge.

## 2014-01-16 ENCOUNTER — Encounter (HOSPITAL_COMMUNITY): Payer: Self-pay | Admitting: Obstetrics

## 2014-01-16 LAB — APTT: aPTT: 34 seconds (ref 24–37)

## 2014-01-16 LAB — PROTIME-INR
INR: 1.79 — AB (ref 0.00–1.49)
Prothrombin Time: 20.3 seconds — ABNORMAL HIGH (ref 11.6–15.2)

## 2014-01-16 MED ORDER — ENOXAPARIN SODIUM 80 MG/0.8ML ~~LOC~~ SOLN
80.0000 mg | Freq: Two times a day (BID) | SUBCUTANEOUS | Status: DC
  Administered 2014-01-16: 80 mg via SUBCUTANEOUS
  Filled 2014-01-16 (×3): qty 0.8

## 2014-01-16 MED ORDER — WARFARIN SODIUM 4 MG PO TABS
4.0000 mg | ORAL_TABLET | Freq: Every day | ORAL | Status: DC
  Filled 2014-01-16: qty 1

## 2014-01-16 MED ORDER — ONDANSETRON 8 MG/NS 50 ML IVPB
8.0000 mg | Freq: Once | INTRAVENOUS | Status: AC
  Administered 2014-01-16: 8 mg via INTRAVENOUS
  Filled 2014-01-16: qty 8

## 2014-01-16 MED ORDER — ENOXAPARIN SODIUM 80 MG/0.8ML ~~LOC~~ SOLN
80.0000 mg | Freq: Two times a day (BID) | SUBCUTANEOUS | Status: DC
  Filled 2014-01-16: qty 0.8

## 2014-01-16 MED ORDER — WARFARIN - PHYSICIAN DOSING INPATIENT: Freq: Every day | Status: DC

## 2014-01-16 NOTE — Progress Notes (Signed)
Pt discharged to home with husband.  Condition stable.  Morning emesis attributed to taking Percocet on an empty stomach.  INR result from this AM called in to Dr. Pamala Hurry late morning and she ordered for Lovenox to be given prior to discharge.  Pt to restart Coumadin tonight per usual home schedule.  Pt ambulated to car with RN.  No equipment for home ordered at discharge.

## 2014-01-16 NOTE — Discharge Summary (Signed)
Frances [name] MRN: [id number] DOB/AGE: 1934/04/09 80 y.o.  Admit date: 01/14/2014 Discharge date: 01/15/14  Admission Diagnoses: post op, heavy bleeding, vaginal wall hematoma at the site of the cystocele repair  Discharge Diagnoses: post op, heavy bleeding        Active Problems:   Post-op bleeding vaginal wall hematoma at the site of the cystocele repair, status post take back to the OR for drainage of the hematoma and oversew of the vaginal mucosa  Discharged Condition: good  Hospital Course: Patient was admitted with vaginal bleeding at the site of the prior cystocele repair. No single bleeding vessel was identified. Patient had her vagina packed with gauze and was admitted for overnight observation. The decision was made to hold the patient's anticoagulation. Her INR was 1.86. Later in the day the patient was noted to continue to have a small amount of ooze from the cystocele and it was thought this was from a hematoma below the vaginal mucosa. The decision was made to take the patient back to the operating room for evacuation of this hematoma and oversew of the vaginal mucosa. Her a.m. Lovenox was held. Patient was taken back to the operating room and evacuation of the hematoma was done. Her operative course was uneventful. The vaginal mucosa was oversewn in interrupted figure-of-eight sutures in the vagina was again packed. A Foley catheter was placed into the bladder. Postoperatively patient was observed overnight and did well. On postoperative day #1 the vaginal packing was removed with minimal staining and the Foley catheter is removed. Patient was able to void appropriately. Patient did have an episode of emesis after the Percocet and this was controlled with antibiotics.  In the a.m. of hospital day #1 the patient was tolerating a regular breakfast and claims no pain no vaginal bleeding.  Filed Vitals:   01/15/14 2208 01/16/14 0204 01/16/14 0543 01/16/14 0935  BP: 138/62 106/57  115/70 134/67  Pulse: 68 65 61 55  Temp: 98.2 F (36.8 C) 98.5 F (36.9 C) 97.3 F (36.3 C) 97.9 F (36.6 C)  TempSrc: Oral Oral Oral Oral  Resp: 18 16 16 18   Height:      Weight:      SpO2: 96% 97% 96% 97%   General: Well-appearing, in no distress Abdomen soft, nontender, nondistended GU: Vaginal packing removed, no vaginal bleeding, single-digit palpation of the anterior vaginal wall revealed no bleeding, intact vaginal mucosa, no evidence of recurrent hematoma Skin: Abdomen and bilateral thigh skin with bruising from her Lovenox injection Lower extremities: Nontender, no edema  Consults: None  Treatments: anticoagulation: LMW heparin and warfarin and surgery: Evacuation of hematoma  Disposition: 01-Home or Self Care   Future Appointments Provider Department Dept Phone   01/31/2014 11:00 AM [name], MD [facility] [phone]       Medication List         calcium-vitamin D 500-200 MG-UNIT per tablet  Take 1 tablet by mouth daily at 12 noon.     diltiazem 180 MG 24 hr capsule  Commonly known as:  DILACOR XR  Take 180 mg by mouth daily.     docusate 50 MG/5ML liquid  Commonly known as:  COLACE  Take 10 mLs (100 mg total) by mouth 2 (two) times daily.     enoxaparin 80 MG/0.8ML injection  Commonly known as:  LOVENOX  Inject 80 mg into the skin.     fish oil-omega-3 fatty acids 1000 MG capsule  Take 1 g by mouth daily.  flecainide 100 MG tablet  Commonly known as:  TAMBOCOR  Take 100 mg by mouth 2 (two) times daily.     hydrochlorothiazide 25 MG tablet  Commonly known as:  HYDRODIURIL  Take 25 mg by mouth daily.     levothyroxine 75 MCG tablet  Commonly known as:  SYNTHROID, LEVOTHROID  Take 75 mcg by mouth daily.     METAMUCIL PO  Take by mouth daily.     multivitamin tablet  Take 0.5 tablets by mouth 2 (two) times daily.     oxyCODONE-acetaminophen 5-325 MG per tablet  Commonly known as:  PERCOCET/ROXICET  Take 1-2 tablets  by mouth every 4 (four) hours as needed for severe pain (moderate to severe pain (when tolerating fluids)).     PROBIOTIC PO  Take by mouth daily.     ramipril 10 MG tablet  Commonly known as:  ALTACE  Take 10 mg by mouth daily.     vitamin C 500 MG tablet  Commonly known as:  ASCORBIC ACID  Take 500 mg by mouth 2 (two) times daily.     warfarin 4 MG tablet  Commonly known as:  COUMADIN  Take 4 mg by mouth daily at 6 PM.         Signed: Claiborne Billings A. Pamala Hurry, MD 01/16/2014, 3:27 PM

## 2014-01-18 DIAGNOSIS — Z7901 Long term (current) use of anticoagulants: Secondary | ICD-10-CM | POA: Diagnosis not present

## 2014-01-18 DIAGNOSIS — I4891 Unspecified atrial fibrillation: Secondary | ICD-10-CM | POA: Diagnosis not present

## 2014-01-22 DIAGNOSIS — I82409 Acute embolism and thrombosis of unspecified deep veins of unspecified lower extremity: Secondary | ICD-10-CM | POA: Diagnosis not present

## 2014-01-22 DIAGNOSIS — Z7901 Long term (current) use of anticoagulants: Secondary | ICD-10-CM | POA: Diagnosis not present

## 2014-01-30 DIAGNOSIS — Z7901 Long term (current) use of anticoagulants: Secondary | ICD-10-CM | POA: Diagnosis not present

## 2014-01-30 DIAGNOSIS — I4891 Unspecified atrial fibrillation: Secondary | ICD-10-CM | POA: Diagnosis not present

## 2014-01-31 ENCOUNTER — Encounter: Payer: Self-pay | Admitting: Cardiology

## 2014-01-31 ENCOUNTER — Ambulatory Visit (INDEPENDENT_AMBULATORY_CARE_PROVIDER_SITE_OTHER): Payer: Medicare Other | Admitting: Cardiology

## 2014-01-31 VITALS — BP 141/79 | HR 74 | Ht 65.0 in | Wt 167.0 lb

## 2014-01-31 DIAGNOSIS — I4891 Unspecified atrial fibrillation: Secondary | ICD-10-CM

## 2014-01-31 DIAGNOSIS — I10 Essential (primary) hypertension: Secondary | ICD-10-CM | POA: Diagnosis not present

## 2014-01-31 NOTE — Patient Instructions (Signed)
Your physician recommends that you continue on your current medications as directed. Please refer to the Current Medication list given to you today.  Your physician wants you to follow-up in: 6 months. You will receive a reminder letter in the mail two months in advance. If you don't receive a letter, please call our office to schedule the follow-up appointment.  

## 2014-01-31 NOTE — Progress Notes (Signed)
[facility]. [address], [address] [place], [place]  [address] Phone: [phone] Fax:  [phone]  Date:  01/31/2014   ID:  Frances [name], DOB March 26, 1934, MRN [id number]  PCP:  Mathews Argyle, MD   History of Present Illness: Frances [name] is a 80 y.o. female for followup, has paroxysmal atrial fibrillation and chronic kidney disease stage III. Her GFR is 54. She is on Coumadin for anticoagulation.   Her stress test 9/11 is low risk without any evidence of ischemia. Because of this, I am using antiarrhythmic therapy, flecainide. Her ejection fraction was normal, thickness was normal on echocardiogram. Flecainide 100 mg twice a day. Treadmill reassuring. No palpitations.   Felt some lightheaded sometimes more than once a day. Frances [name]. Prior afib symptom was a little bit of chest pain upper chest wall like "when a child running hard".  Anticoagulation monitoring-currently being performed by Dr. Felipa Eth.  Hypertension-currently well controlled, calcium channel blocker, ACE inhibitor, hydrochlorothiazide.. Dr. Felipa Eth.  01/31/14 - surgery twice in 12 days. More energy. Tried to stop HCTZ but BP increased and she went back on. Coumadin. Had difficulty getting pre op note from [place]. Dr. Felipa Eth took care of coumadin/Lovenox.   Wt Readings from Last 3 Encounters:  01/31/14 167 lb (75.751 kg)  01/14/14 165 lb 3.2 oz (74.934 kg)  01/14/14 165 lb 3.2 oz (74.934 kg)     Past Medical History  Diagnosis Date  . Thyroid disease     hypothyroidism  . Atrial fibrillation   . Internal hemorrhoid   . Rectal bleeding   . SVD (spontaneous vaginal delivery) 1957, 1959    x 2  . Peripheral vascular disease     right arm and right shoulder blood clots r/t a fall  . Hypothyroidism   . Hypertension     controlled with meds  . Dysrhythmia     Hx - a-fib 05/2010 - tx with meds, no problem since 05/2010  . Chronic kidney disease     stage 3 renal disease - no med  . Arthritis       hands, back  . Hearing loss     bilateral hearing aids    Past Surgical History  Procedure Laterality Date  . Tonsillectomy    . Appendectomy    . Cystocele repair    . Joint replacement      left thumb replacement  . Colonoscopy    . Hand surgery      left  . Hemorrhoid injection  04/2011  . Dental surgery      infected tooth - general  . Dilation and curettage of uterus      x several  . Abdominal hysterectomy      and right ovary  . Back surgery      x 3 - 2 rods and 8 screws  . Anterior and posterior repair N/A 01/03/2014    Procedure: ANTERIOR (CYSTOCELE) REPAIR;  Surgeon: Frances [name]. Frances Hurry, MD;  Location: [facility];  Service: Gynecology;  Laterality: N/A;  . Examination under anesthesia N/A 01/15/2014    Procedure: EXAM UNDER ANESTHESIA with evacuation of hematoma and over sewing of vaginal mucosa.;  Surgeon: Frances [name]. Frances Hurry, MD;  Location: [facility];  Service: Gynecology;  Laterality: N/A;    Current Outpatient Prescriptions  Medication Sig Dispense Refill  . Calcium Carbonate-Vitamin D (CALCIUM-VITAMIN D) 500-200 MG-UNIT per tablet Take 1 tablet by mouth daily at 12 noon.       Frances [name]  diltiazem (DILACOR XR) 180 MG 24 hr capsule Take 180 mg by mouth daily.      Frances [name] docusate (COLACE) 50 MG/5ML liquid Take 10 mLs (100 mg total) by mouth 2 (two) times daily.  100 mL  0  . fish oil-omega-3 fatty acids 1000 MG capsule Take 1 g by mouth daily.       . flecainide (TAMBOCOR) 100 MG tablet Take 100 mg by mouth 2 (two) times daily.        . hydrochlorothiazide (HYDRODIURIL) 25 MG tablet Take 25 mg by mouth daily.      Frances [name] levothyroxine (SYNTHROID, LEVOTHROID) 75 MCG tablet Take 75 mcg by mouth daily.        . Multiple Vitamin (MULTIVITAMIN) tablet Take 0.5 tablets by mouth 2 (two) times daily.       Frances [name] oxyCODONE-acetaminophen (PERCOCET/ROXICET) 5-325 MG per tablet Take 1-2 tablets by mouth every 4 (four) hours as needed for severe pain (moderate to severe pain (when tolerating fluids)).  30  tablet  0  . Probiotic Product (PROBIOTIC PO) Take by mouth daily.        . ramipril (ALTACE) 10 MG tablet Take 10 mg by mouth daily.        . vitamin C (ASCORBIC ACID) 500 MG tablet Take 500 mg by mouth 2 (two) times daily.       Frances [name] warfarin (COUMADIN) 4 MG tablet Take 4 mg by mouth daily at 6 PM.       No current facility-administered medications for this visit.    Allergies:    Allergies  Allergen Reactions  . Codeine Nausea And Vomiting  . Pneumovax 23 [Pneumococcal Vac Polyvalent] Other (See Comments)    Arm swelling & fever  . Cephalexin Anxiety  . Sudafed [Pseudoephedrine Hcl] Anxiety  . Sulfa Antibiotics Rash    Social History:  The patient  reports that she has never smoked. She has never used smokeless tobacco. She reports that she does not drink alcohol or use illicit drugs.   ROS:  Please see the history of present illness.   No bleeding, no syncope, no orthopnea, no PND  PHYSICAL EXAM: VS:  BP 141/79  Pulse 74  Ht 5\' 5"  (1.651 m)  Wt 167 lb (75.751 kg)  BMI 27.79 kg/m2 Well nourished, well developed, in no acute distress HEENT: normal Neck: no JVD Cardiac:  normal S1, S2; RRR; no murmur Lungs:  clear to auscultation bilaterally, no wheezing, rhonchi or rales Ext: no edema Skin: warm and dry Neuro: no focal abnormalities noted  EKG:  Sinus rhythm, first degree AV block, 220 ms, heart rate of 60, normal QRS duration, left axis deviation, poor R wave progression.     ASSESSMENT AND PLAN:  1. Paroxysmal atrial fibrillation-currently doing well with anticoagulation, antiarrhythmic, flecainide. Low-dose diltiazem. This is been reduced in the past because of pause at night. No changes made. No pacemaker requirements. 2. Dizziness- she is feeling better. Her blood pressure seems to be under good control.  3. On anticoagulation-continue warfarin. No bleeding, Dr. Felipa Eth monitoring.  4. First degree AV block - chronic. Monitoring closely especially with agents used  to monitor atrial fibrillation. She did bring up that she was not ready to take any novel anticoagulation agents. Brought up to the commercials.  Signed, [name], MD [facility]  01/31/2014 11:00 AM

## 2014-02-06 DIAGNOSIS — I4891 Unspecified atrial fibrillation: Secondary | ICD-10-CM | POA: Diagnosis not present

## 2014-02-06 DIAGNOSIS — Z7901 Long term (current) use of anticoagulants: Secondary | ICD-10-CM | POA: Diagnosis not present

## 2014-02-07 ENCOUNTER — Other Ambulatory Visit: Payer: Self-pay | Admitting: Cardiology

## 2014-02-13 DIAGNOSIS — I4891 Unspecified atrial fibrillation: Secondary | ICD-10-CM | POA: Diagnosis not present

## 2014-02-13 DIAGNOSIS — Z7901 Long term (current) use of anticoagulants: Secondary | ICD-10-CM | POA: Diagnosis not present

## 2014-03-06 DIAGNOSIS — Z7901 Long term (current) use of anticoagulants: Secondary | ICD-10-CM | POA: Diagnosis not present

## 2014-03-06 DIAGNOSIS — I4891 Unspecified atrial fibrillation: Secondary | ICD-10-CM | POA: Diagnosis not present

## 2014-04-03 DIAGNOSIS — Z7901 Long term (current) use of anticoagulants: Secondary | ICD-10-CM | POA: Diagnosis not present

## 2014-04-03 DIAGNOSIS — I4891 Unspecified atrial fibrillation: Secondary | ICD-10-CM | POA: Diagnosis not present

## 2014-04-25 DIAGNOSIS — H6980 Other specified disorders of Eustachian tube, unspecified ear: Secondary | ICD-10-CM | POA: Diagnosis not present

## 2014-05-01 DIAGNOSIS — I4891 Unspecified atrial fibrillation: Secondary | ICD-10-CM | POA: Diagnosis not present

## 2014-05-01 DIAGNOSIS — Z7901 Long term (current) use of anticoagulants: Secondary | ICD-10-CM | POA: Diagnosis not present

## 2014-05-02 DIAGNOSIS — H6990 Unspecified Eustachian tube disorder, unspecified ear: Secondary | ICD-10-CM | POA: Diagnosis not present

## 2014-05-02 DIAGNOSIS — N183 Chronic kidney disease, stage 3 unspecified: Secondary | ICD-10-CM | POA: Diagnosis not present

## 2014-05-02 DIAGNOSIS — I4891 Unspecified atrial fibrillation: Secondary | ICD-10-CM | POA: Diagnosis not present

## 2014-05-02 DIAGNOSIS — I129 Hypertensive chronic kidney disease with stage 1 through stage 4 chronic kidney disease, or unspecified chronic kidney disease: Secondary | ICD-10-CM | POA: Diagnosis not present

## 2014-05-02 DIAGNOSIS — J040 Acute laryngitis: Secondary | ICD-10-CM | POA: Diagnosis not present

## 2014-05-16 DIAGNOSIS — D485 Neoplasm of uncertain behavior of skin: Secondary | ICD-10-CM | POA: Diagnosis not present

## 2014-05-16 DIAGNOSIS — D235 Other benign neoplasm of skin of trunk: Secondary | ICD-10-CM | POA: Diagnosis not present

## 2014-05-16 DIAGNOSIS — L570 Actinic keratosis: Secondary | ICD-10-CM | POA: Diagnosis not present

## 2014-05-21 DIAGNOSIS — Z23 Encounter for immunization: Secondary | ICD-10-CM | POA: Diagnosis not present

## 2014-05-23 DIAGNOSIS — H2510 Age-related nuclear cataract, unspecified eye: Secondary | ICD-10-CM | POA: Diagnosis not present

## 2014-05-29 DIAGNOSIS — I4891 Unspecified atrial fibrillation: Secondary | ICD-10-CM | POA: Diagnosis not present

## 2014-05-29 DIAGNOSIS — Z7901 Long term (current) use of anticoagulants: Secondary | ICD-10-CM | POA: Diagnosis not present

## 2014-06-13 DIAGNOSIS — L570 Actinic keratosis: Secondary | ICD-10-CM | POA: Diagnosis not present

## 2014-06-13 DIAGNOSIS — X32XXXA Exposure to sunlight, initial encounter: Secondary | ICD-10-CM | POA: Diagnosis not present

## 2014-06-13 DIAGNOSIS — D485 Neoplasm of uncertain behavior of skin: Secondary | ICD-10-CM | POA: Diagnosis not present

## 2014-06-13 DIAGNOSIS — D225 Melanocytic nevi of trunk: Secondary | ICD-10-CM | POA: Diagnosis not present

## 2014-06-15 DIAGNOSIS — Z79899 Other long term (current) drug therapy: Secondary | ICD-10-CM | POA: Diagnosis not present

## 2014-06-15 DIAGNOSIS — I482 Chronic atrial fibrillation: Secondary | ICD-10-CM | POA: Diagnosis not present

## 2014-06-15 DIAGNOSIS — Z0000 Encounter for general adult medical examination without abnormal findings: Secondary | ICD-10-CM | POA: Diagnosis not present

## 2014-06-15 DIAGNOSIS — I129 Hypertensive chronic kidney disease with stage 1 through stage 4 chronic kidney disease, or unspecified chronic kidney disease: Secondary | ICD-10-CM | POA: Diagnosis not present

## 2014-06-15 DIAGNOSIS — Z1389 Encounter for screening for other disorder: Secondary | ICD-10-CM | POA: Diagnosis not present

## 2014-06-15 DIAGNOSIS — E039 Hypothyroidism, unspecified: Secondary | ICD-10-CM | POA: Diagnosis not present

## 2014-06-26 DIAGNOSIS — Z7901 Long term (current) use of anticoagulants: Secondary | ICD-10-CM | POA: Diagnosis not present

## 2014-07-09 ENCOUNTER — Encounter: Payer: Self-pay | Admitting: Cardiology

## 2014-07-09 ENCOUNTER — Other Ambulatory Visit: Payer: Self-pay | Admitting: Cardiology

## 2014-07-12 ENCOUNTER — Other Ambulatory Visit: Payer: Self-pay

## 2014-07-12 MED ORDER — FLECAINIDE ACETATE 100 MG PO TABS: ORAL_TABLET | ORAL | Status: DC

## 2014-07-24 DIAGNOSIS — Z7901 Long term (current) use of anticoagulants: Secondary | ICD-10-CM | POA: Diagnosis not present

## 2014-07-24 DIAGNOSIS — I4891 Unspecified atrial fibrillation: Secondary | ICD-10-CM | POA: Diagnosis not present

## 2014-07-30 ENCOUNTER — Ambulatory Visit (INDEPENDENT_AMBULATORY_CARE_PROVIDER_SITE_OTHER): Payer: Medicare Other | Admitting: Cardiology

## 2014-07-30 ENCOUNTER — Encounter: Payer: Self-pay | Admitting: Cardiology

## 2014-07-30 VITALS — BP 122/78 | HR 72 | Ht 65.0 in | Wt 171.0 lb

## 2014-07-30 DIAGNOSIS — I10 Essential (primary) hypertension: Secondary | ICD-10-CM

## 2014-07-30 DIAGNOSIS — I480 Paroxysmal atrial fibrillation: Secondary | ICD-10-CM

## 2014-07-30 DIAGNOSIS — I440 Atrioventricular block, first degree: Secondary | ICD-10-CM

## 2014-07-30 DIAGNOSIS — Z7901 Long term (current) use of anticoagulants: Secondary | ICD-10-CM

## 2014-07-30 DIAGNOSIS — R42 Dizziness and giddiness: Secondary | ICD-10-CM

## 2014-07-30 HISTORY — DX: Atrioventricular block, first degree: I44.0

## 2014-07-30 HISTORY — DX: Long term (current) use of anticoagulants: Z79.01

## 2014-07-30 HISTORY — DX: Dizziness and giddiness: R42

## 2014-07-30 MED ORDER — FLECAINIDE ACETATE 50 MG PO TABS: ORAL_TABLET | ORAL | Status: DC

## 2014-07-30 NOTE — Patient Instructions (Signed)
Please decrease your Flecainide to 50 mg twice a day. Continue all other medications as listed.  Let us know if you have problems once the Flecainide has been decreased and when you need a refill.  Follow up in 6 months with Dr. Marlou Porch.  You will receive a letter in the mail 2 months before you are due.  Please call us when you receive this letter to schedule your follow up appointment.

## 2014-07-30 NOTE — Progress Notes (Signed)
[facility]. [address] [place], [place]  [address] Phone: [phone] Fax:  [phone]  Date:  07/30/2014   ID:  Frances [name], DOB 09/16/1933, MRN [id number]  PCP:  Mathews Argyle, MD   History of Present Illness: Frances [name] is a 80 y.o. female for followup, has paroxysmal atrial fibrillation 2011 and chronic kidney disease stage III. Her GFR is 54. She is on Coumadin for anticoagulation followed by Dr. Felipa Eth.  Her stress test 9/11 is low risk without any evidence of ischemia. Because of this, I am using antiarrhythmic therapy, flecainide. Her ejection fraction was normal, thickness was normal on echocardiogram. Flecainide 100 mg twice a day reduced to 50 mg twice a day on 07/30/14. Treadmill reassuring. No palpitations.   Felt some lightheaded sometimes few seconds duration chronically. Fleeting. Prior afib symptom was a little bit of chest pain upper chest/ throat wall like "when a child running hard".   Hypertension-currently well controlled, calcium channel blocker, ACE inhibitor, hydrochlorothiazide.. Dr. Felipa Eth.  She tells story about her father who is a vegetarian.  Wt Readings from Last 3 Encounters:  07/30/14 171 lb (77.565 kg)  01/31/14 167 lb (75.751 kg)  01/14/14 165 lb 3.2 oz (74.934 kg)     Past Medical History  Diagnosis Date  . Thyroid disease     hypothyroidism  . Atrial fibrillation   . Internal hemorrhoid   . Rectal bleeding   . SVD (spontaneous vaginal delivery) 1957, 1959    x 2  . Peripheral vascular disease     right arm and right shoulder blood clots r/t a fall  . Hypothyroidism   . Hypertension     controlled with meds  . Dysrhythmia     Hx - a-fib 05/2010 - tx with meds, no problem since 05/2010  . Chronic kidney disease     stage 3 renal disease - no med  . Arthritis     hands, back  . Hearing loss     bilateral hearing aids    Past Surgical History  Procedure Laterality Date  . Tonsillectomy      . Appendectomy    . Cystocele repair    . Joint replacement      left thumb replacement  . Colonoscopy    . Hand surgery      left  . Hemorrhoid injection  04/2011  . Dental surgery      infected tooth - general  . Dilation and curettage of uterus      x several  . Abdominal hysterectomy      and right ovary  . Back surgery      x 3 - 2 rods and 8 screws  . Anterior and posterior repair N/A 01/03/2014    Procedure: ANTERIOR (CYSTOCELE) REPAIR;  Surgeon: Floyce Stakes. Pamala Hurry, MD;  Location: [facility];  Service: Gynecology;  Laterality: N/A;  . Examination under anesthesia N/A 01/15/2014    Procedure: EXAM UNDER ANESTHESIA with evacuation of hematoma and over sewing of vaginal mucosa.;  Surgeon: Floyce Stakes. Pamala Hurry, MD;  Location: [facility];  Service: Gynecology;  Laterality: N/A;    Current Outpatient Prescriptions  Medication Sig Dispense Refill  . Calcium Carbonate-Vitamin D (CALCIUM-VITAMIN D) 500-200 MG-UNIT per tablet Take 1 tablet by mouth daily at 12 noon.     . diltiazem (CARDIZEM) 120 MG tablet Take 120 mg by mouth 4 (four) times daily.    . fish oil-omega-3 fatty acids 1000 MG capsule  Take 1 g by mouth daily.     . flecainide (TAMBOCOR) 100 MG tablet TAKE ONE TABLET BY MOUTH EVERY TWELVE HOURS 60 tablet 1  . hydrochlorothiazide (HYDRODIURIL) 25 MG tablet Take 25 mg by mouth daily.    Marland Kitchen levothyroxine (SYNTHROID, LEVOTHROID) 75 MCG tablet Take 75 mcg by mouth daily.      . Multiple Vitamin (MULTIVITAMIN) tablet Take 0.5 tablets by mouth 2 (two) times daily.     Marland Kitchen PARoxetine (PAXIL) 10 MG tablet     . Probiotic Product (PROBIOTIC PO) Take by mouth daily.      . ramipril (ALTACE) 10 MG tablet Take 10 mg by mouth daily.      . vitamin C (ASCORBIC ACID) 500 MG tablet Take 500 mg by mouth 2 (two) times daily.     Marland Kitchen warfarin (COUMADIN) 3 MG tablet      No current facility-administered medications for this visit.    Allergies:    Allergies  Allergen Reactions  . Codeine Nausea And  Vomiting  . Pneumovax 23 [Pneumococcal Vac Polyvalent] Other (See Comments)    Arm swelling & fever  . Cephalexin Anxiety  . Sudafed [Pseudoephedrine Hcl] Anxiety  . Sulfa Antibiotics Rash    Social History:  The patient  reports that she has never smoked. She has never used smokeless tobacco. She reports that she does not drink alcohol or use illicit drugs.   ROS:  Please see the history of present illness.   No bleeding, no syncope, no orthopnea, no PND  PHYSICAL EXAM: VS:  BP 122/78 mmHg  Pulse 72  Ht 5\' 5"  (1.651 m)  Wt 171 lb (77.565 kg)  BMI 28.46 kg/m2 Well nourished, well developed, in no acute distress HEENT: normal Neck: no JVD Cardiac:  normal S1, S2; RRR; no murmur Lungs:  clear to auscultation bilaterally, no wheezing, rhonchi or rales Ext: no edema Skin: warm and dry Neuro: no focal abnormalities noted  EKG:  07/30/14-sinus rhythm, first-degree AV block, left anterior fascicular block, poor R-wave progression, septal infarct pattern. Prior EKG-Sinus rhythm, first degree AV block, 220 ms, heart rate of 60, normal QRS duration, left axis deviation, poor R wave progression.     ASSESSMENT AND PLAN:  1. Paroxysmal atrial fibrillation-currently doing well with anticoagulation, antiarrhythmic, flecainide. Low-dose diltiazem 120 CD. It has been several years, 2011, since her last episode. Because of this, I'm going to try to decrease her flecainide to 50 mg twice a day. She will let me know if she begins to have any symptoms. Her sister also has atrial fibrillation. Diltiazem has been reduced in the past because of pause at night. No pacemaker requirements. 2. Dizziness- she is feeling better, but still intermittently feeling the symptoms. Fatigue is her main complaint. Her blood pressure seems to be under good control.  3. On anticoagulation-continue warfarin. No bleeding, Dr. Felipa Eth monitoring.  4. First degree AV block - chronic. Monitoring closely especially with  agents used to monitor atrial fibrillation. I'm decreasing her flecainide to 50 twice a day  She did bring up previously that she was not ready to take any novel anticoagulation agents. Brought up to the commercials. 5. 6 month follow up  Signed, [name], MD [facility]  07/30/2014 8:53 AM

## 2014-08-06 ENCOUNTER — Telehealth: Payer: Self-pay | Admitting: Cardiology

## 2014-08-06 MED ORDER — DILTIAZEM HCL ER COATED BEADS 180 MG PO CP24: 180.0000 mg | ORAL_CAPSULE | Freq: Every day | ORAL | Status: DC

## 2014-08-06 MED ORDER — FLECAINIDE ACETATE 50 MG PO TABS: ORAL_TABLET | ORAL | Status: DC

## 2014-08-06 NOTE — Telephone Encounter (Signed)
Pt wanted a RX for Flecainide 50 mg sent into the pharmacy and to clarify the diltiazem Rx.

## 2014-08-06 NOTE — Telephone Encounter (Signed)
New message      Talk to a nurse regarding her medication

## 2014-08-17 ENCOUNTER — Telehealth: Payer: Self-pay | Admitting: Cardiology

## 2014-08-17 MED ORDER — FLECAINIDE ACETATE 100 MG PO TABS: ORAL_TABLET | ORAL | Status: DC

## 2014-08-17 NOTE — Telephone Encounter (Signed)
Pt aware and will continue to monitor BP and HR.  She will call back with further concerns.

## 2014-08-17 NOTE — Telephone Encounter (Signed)
New Msg   Pt calling to report her progress with new meds. Please contact at [phone]

## 2014-08-17 NOTE — Telephone Encounter (Signed)
Per pt call - reports last night she felt she was back in At Fib (same s/s as before)  She attempted to take to HR with an app on her ipad and it was "all over the place"  When she was able to obtain it, it was 92 and her BP was 146/86.  She took flecainide 100 mg last night and again this AM.  She wanted to make sure she should continue this is OK to continue.  This AM she feels back in normal rhythm with a HR of 60 bmp but BP 159/89 1 hour after taking AM meds.  Aware I will review with Dr Marlou Porch and call her back.

## 2014-08-17 NOTE — Telephone Encounter (Signed)
I'm fine with her increasing her dose back to 100 mg twice a day of flecainide. [name], MD

## 2014-08-21 DIAGNOSIS — Z7901 Long term (current) use of anticoagulants: Secondary | ICD-10-CM | POA: Diagnosis not present

## 2014-08-27 ENCOUNTER — Other Ambulatory Visit: Payer: Self-pay

## 2014-08-27 ENCOUNTER — Telehealth: Payer: Self-pay | Admitting: Cardiology

## 2014-08-27 MED ORDER — FLECAINIDE ACETATE 100 MG PO TABS: 100.0000 mg | ORAL_TABLET | Freq: Two times a day (BID) | ORAL | Status: DC

## 2014-08-27 NOTE — Telephone Encounter (Signed)
Pt needed RX for 100 mg Flecainide.

## 2014-08-27 NOTE — Telephone Encounter (Signed)
New message ° ° ° ° ° °Returning Pam's call °

## 2014-09-05 DIAGNOSIS — R05 Cough: Secondary | ICD-10-CM | POA: Diagnosis not present

## 2014-09-08 DIAGNOSIS — J209 Acute bronchitis, unspecified: Secondary | ICD-10-CM | POA: Diagnosis not present

## 2014-09-12 DIAGNOSIS — J4542 Moderate persistent asthma with status asthmaticus: Secondary | ICD-10-CM | POA: Diagnosis not present

## 2014-09-12 DIAGNOSIS — I482 Chronic atrial fibrillation: Secondary | ICD-10-CM | POA: Diagnosis not present

## 2014-09-14 DIAGNOSIS — I482 Chronic atrial fibrillation: Secondary | ICD-10-CM | POA: Diagnosis not present

## 2014-09-14 DIAGNOSIS — Z7901 Long term (current) use of anticoagulants: Secondary | ICD-10-CM | POA: Diagnosis not present

## 2014-09-18 DIAGNOSIS — Z7901 Long term (current) use of anticoagulants: Secondary | ICD-10-CM | POA: Diagnosis not present

## 2014-09-25 DIAGNOSIS — Z86718 Personal history of other venous thrombosis and embolism: Secondary | ICD-10-CM | POA: Diagnosis not present

## 2014-09-25 DIAGNOSIS — Z7901 Long term (current) use of anticoagulants: Secondary | ICD-10-CM | POA: Diagnosis not present

## 2014-10-09 DIAGNOSIS — Z86718 Personal history of other venous thrombosis and embolism: Secondary | ICD-10-CM | POA: Diagnosis not present

## 2014-10-09 DIAGNOSIS — Z7901 Long term (current) use of anticoagulants: Secondary | ICD-10-CM | POA: Diagnosis not present

## 2014-10-15 DIAGNOSIS — H6991 Unspecified Eustachian tube disorder, right ear: Secondary | ICD-10-CM | POA: Diagnosis not present

## 2014-10-16 DIAGNOSIS — I482 Chronic atrial fibrillation: Secondary | ICD-10-CM | POA: Diagnosis not present

## 2014-10-16 DIAGNOSIS — I129 Hypertensive chronic kidney disease with stage 1 through stage 4 chronic kidney disease, or unspecified chronic kidney disease: Secondary | ICD-10-CM | POA: Diagnosis not present

## 2014-10-16 DIAGNOSIS — I10 Essential (primary) hypertension: Secondary | ICD-10-CM | POA: Diagnosis not present

## 2014-10-20 ENCOUNTER — Telehealth: Payer: Self-pay | Admitting: Cardiology

## 2014-10-20 NOTE — Telephone Encounter (Signed)
Patient called to state that she went into afib tonight.  She has a history of PAF and is on flecainide, diltiazem and warfarin.   She stated that through the day her HR has been 97-125bpm.  Her BP tonight is 122/2mmHg. She denies any chest pain, SOB, dizziness or syncope.  She feels mildly fatigued.  Instructed patient to take an extra dose of Flecainide 50mg  now. She will call back if she starts to feel bad.  She has been instructed to call Dr. Marlou Porch office Monday am for further instructions on medication.

## 2014-10-22 ENCOUNTER — Telehealth: Payer: Self-pay | Admitting: Cardiology

## 2014-10-22 NOTE — Telephone Encounter (Signed)
New message    Patient calling  Went into Afib over the weekend. Spoke with Dr. Radford Pax . Was advise to call this am.

## 2014-10-22 NOTE — Telephone Encounter (Signed)
Thanks, agree with plan. [name], MD'

## 2014-10-22 NOTE — Telephone Encounter (Signed)
Agree with extra flecainide if needed.  [name], MD

## 2014-10-22 NOTE — Telephone Encounter (Signed)
Patient st after talking to Dr. Radford Pax Saturday and taking an extra dose of Flec 50 mg, she returned to [place] about 90 minutes later and has remained in SR ever since. Explained to patient Dr. Marlou Porch is not in the office until Wednesday so she will hear from his nurse soon. Instructed her to call if she converts back to Afib. Patient agrees with treatment plan.

## 2014-10-23 DIAGNOSIS — Z7901 Long term (current) use of anticoagulants: Secondary | ICD-10-CM | POA: Diagnosis not present

## 2014-10-23 NOTE — Telephone Encounter (Signed)
Spoke with pt who states she feels fine now.  No problems.  She is able to take HR on her iPad and it has been normal since taking the extra Flecainide.  She has a scheduled f/u but will call back if problems prior to then.

## 2014-11-27 DIAGNOSIS — Z7901 Long term (current) use of anticoagulants: Secondary | ICD-10-CM | POA: Diagnosis not present

## 2014-12-25 DIAGNOSIS — Z7901 Long term (current) use of anticoagulants: Secondary | ICD-10-CM | POA: Diagnosis not present

## 2015-01-18 DIAGNOSIS — X32XXXD Exposure to sunlight, subsequent encounter: Secondary | ICD-10-CM | POA: Diagnosis not present

## 2015-01-18 DIAGNOSIS — L570 Actinic keratosis: Secondary | ICD-10-CM | POA: Diagnosis not present

## 2015-01-18 DIAGNOSIS — L859 Epidermal thickening, unspecified: Secondary | ICD-10-CM | POA: Diagnosis not present

## 2015-01-18 DIAGNOSIS — D225 Melanocytic nevi of trunk: Secondary | ICD-10-CM | POA: Diagnosis not present

## 2015-01-22 DIAGNOSIS — Z7901 Long term (current) use of anticoagulants: Secondary | ICD-10-CM | POA: Diagnosis not present

## 2015-01-28 ENCOUNTER — Ambulatory Visit (INDEPENDENT_AMBULATORY_CARE_PROVIDER_SITE_OTHER): Payer: Medicare Other | Admitting: Cardiology

## 2015-01-28 ENCOUNTER — Encounter: Payer: Self-pay | Admitting: Cardiology

## 2015-01-28 VITALS — BP 116/60 | HR 67 | Ht 65.0 in | Wt 177.4 lb

## 2015-01-28 DIAGNOSIS — I10 Essential (primary) hypertension: Secondary | ICD-10-CM

## 2015-01-28 DIAGNOSIS — J4520 Mild intermittent asthma, uncomplicated: Secondary | ICD-10-CM

## 2015-01-28 DIAGNOSIS — I480 Paroxysmal atrial fibrillation: Secondary | ICD-10-CM

## 2015-01-28 DIAGNOSIS — Z7901 Long term (current) use of anticoagulants: Secondary | ICD-10-CM | POA: Diagnosis not present

## 2015-01-28 NOTE — Progress Notes (Signed)
[facility]. [address] [place], [place]  [address] Phone: [phone] Fax:  [phone]  Date:  01/28/2015   ID:  Frances [name], DOB 12-Jan-1934, MRN [id number]  PCP:  Mathews Argyle, MD   History of Present Illness: Frances [name] is a 81 y.o. female for followup, has paroxysmal atrial fibrillation 2011 and chronic kidney disease stage III. Her GFR is 54. She is on Coumadin for anticoagulation followed by Dr. Felipa Eth.  Her stress test 9/11 is low risk without any evidence of ischemia. Because of this, I am using antiarrhythmic therapy, flecainide. Her ejection fraction was normal, thickness was normal on echocardiogram. Flecainide 100 mg twice a day reduced to 50 mg twice a day on 07/30/14. Treadmill reassuring. No palpitations.   Felt some lightheaded sometimes few seconds duration chronically. Fleeting. Prior afib symptom was a little bit of chest pain upper chest/ throat wall like "when a child running hard".   Hypertension-currently well controlled, calcium channel blocker, ACE inhibitor, hydrochlorothiazide.. Dr. Felipa Eth.  She tells story about her father who is a vegetarian.  Has sensation of not being able to take in a deep breath. She's had no bleeding with Coumadin.  Wt Readings from Last 3 Encounters:  01/28/15 177 lb 6.4 oz (80.468 kg)  07/30/14 171 lb (77.565 kg)  01/31/14 167 lb (75.751 kg)     Past Medical History  Diagnosis Date  . Thyroid disease     hypothyroidism  . Atrial fibrillation   . Internal hemorrhoid   . Rectal bleeding   . SVD (spontaneous vaginal delivery) 1957, 1959    x 2  . Peripheral vascular disease     right arm and right shoulder blood clots r/t a fall  . Hypothyroidism   . Hypertension     controlled with meds  . Dysrhythmia     Hx - a-fib 05/2010 - tx with meds, no problem since 05/2010  . Chronic kidney disease     stage 3 renal disease - no med  . Arthritis     hands, back  . Hearing loss    bilateral hearing aids    Past Surgical History  Procedure Laterality Date  . Tonsillectomy    . Appendectomy    . Cystocele repair    . Joint replacement      left thumb replacement  . Colonoscopy    . Hand surgery      left  . Hemorrhoid injection  04/2011  . Dental surgery      infected tooth - general  . Dilation and curettage of uterus      x several  . Abdominal hysterectomy      and right ovary  . Back surgery      x 3 - 2 rods and 8 screws  . Anterior and posterior repair N/A 01/03/2014    Procedure: ANTERIOR (CYSTOCELE) REPAIR;  Surgeon: Floyce Stakes. Pamala Hurry, MD;  Location: [facility];  Service: Gynecology;  Laterality: N/A;  . Examination under anesthesia N/A 01/15/2014    Procedure: EXAM UNDER ANESTHESIA with evacuation of hematoma and over sewing of vaginal mucosa.;  Surgeon: Floyce Stakes. Pamala Hurry, MD;  Location: [facility];  Service: Gynecology;  Laterality: N/A;    Current Outpatient Prescriptions  Medication Sig Dispense Refill  . Calcium Carbonate-Vitamin D (CALCIUM-VITAMIN D) 500-200 MG-UNIT per tablet Take 1 tablet by mouth daily at 12 noon.     . diltiazem (CARDIZEM CD) 180 MG 24 hr capsule Take  1 capsule (180 mg total) by mouth daily.    . fish oil-omega-3 fatty acids 1000 MG capsule Take 1 g by mouth daily.     . flecainide (TAMBOCOR) 100 MG tablet Take 1 tablet (100 mg total) by mouth 2 (two) times daily. TAKE ONE TABLET BY MOUTH EVERY TWELVE HOURS 180 tablet 3  . hydrochlorothiazide (HYDRODIURIL) 25 MG tablet Take 25 mg by mouth daily.    Marland Kitchen levothyroxine (SYNTHROID, LEVOTHROID) 75 MCG tablet Take 75 mcg by mouth daily.      . Multiple Vitamin (MULTIVITAMIN) tablet Take 0.5 tablets by mouth 2 (two) times daily.     Marland Kitchen PARoxetine (PAXIL) 10 MG tablet Take 10 mg by mouth daily.     . Probiotic Product (PROBIOTIC PO) Take 1 tablet by mouth daily.     . ramipril (ALTACE) 10 MG tablet Take 10 mg by mouth daily.      . vitamin C (ASCORBIC ACID) 500 MG tablet Take 500 mg by mouth  2 (two) times daily.     Marland Kitchen warfarin (COUMADIN) 3 MG tablet Taking as directed...currently 3 mg and 2 mg by mouth     No current facility-administered medications for this visit.    Allergies:    Allergies  Allergen Reactions  . Pneumovax 23 [Pneumococcal Vac Polyvalent] Other (See Comments)    Arm swelling & fever  . Codeine Nausea And Vomiting  . Cephalexin Anxiety  . Sudafed [Pseudoephedrine Hcl] Anxiety  . Sulfa Antibiotics Rash    Social History:  The patient  reports that she has never smoked. She has never used smokeless tobacco. She reports that she does not drink alcohol or use illicit drugs.   ROS:  Please see the history of present illness.   No bleeding, no syncope, no orthopnea, no PND  PHYSICAL EXAM: VS:  BP 116/60 mmHg  Pulse 67  Ht 5\' 5"  (1.651 m)  Wt 177 lb 6.4 oz (80.468 kg)  BMI 29.52 kg/m2  SpO2 97% Well nourished, well developed, in no acute distress HEENT: normal Neck: no JVD Cardiac:  normal S1, S2; RRR; no murmur Lungs:  clear to auscultation bilaterally, no wheezing, rhonchi or rales Ext: no edema Skin: warm and dry Neuro: no focal abnormalities noted  EKG:  07/30/14-sinus rhythm, first-degree AV block, left anterior fascicular block, poor R-wave progression, septal infarct pattern. Prior EKG-Sinus rhythm, first degree AV block, 220 ms, heart rate of 60, normal QRS duration, left axis deviation, poor R wave progression.     ASSESSMENT AND PLAN:  1. Paroxysmal atrial fibrillation-currently doing well with anticoagulation, antiarrhythmic, flecainide 100mg  BID. Low-dose diltiazem 120 CD. We tried to decrease dose to 50 BID but she had another episode. In 10/2014 episode at night, Dr. Radford Pax on call had her take 50mg  extra flec. Worked. . Her sister also has atrial fibrillation. Diltiazem has been reduced in the past because of pause at night. No pacemaker requirements. 2. Dizziness- she is feeling better, but still intermittently feeling the symptoms.  Fatigue is her main complaint. Her blood pressure seems to be under good control.  3. On anticoagulation-continue warfarin. No bleeding, Dr. Felipa Eth monitoring.  4. First degree AV block - chronic. Monitoring closely especially with agents used to monitor atrial fibrillation.   She did bring up previously that she was not ready to take any novel anticoagulation agents. Brought up the commercials. 5. Dyspnea - has had bout of asthma bronchitic. She has sensation of not being able to take in a deep  breath (restrictive like).  6. 6 month follow up  Signed, [name], MD [facility]  01/28/2015 1:56 PM

## 2015-01-28 NOTE — Patient Instructions (Signed)
Medication Instructions:  Your physician recommends that you continue on your current medications as directed. Please refer to the Current Medication list given to you today.  Follow-Up: Follow up in 6 months with Dr. Skains.  You will receive a letter in the mail 2 months before you are due.  Please call us when you receive this letter to schedule your follow up appointment.  Thank you for choosing [facility]!!     

## 2015-02-18 DIAGNOSIS — N8111 Cystocele, midline: Secondary | ICD-10-CM | POA: Diagnosis not present

## 2015-02-18 DIAGNOSIS — Z01419 Encounter for gynecological examination (general) (routine) without abnormal findings: Secondary | ICD-10-CM | POA: Diagnosis not present

## 2015-02-18 DIAGNOSIS — Z124 Encounter for screening for malignant neoplasm of cervix: Secondary | ICD-10-CM | POA: Diagnosis not present

## 2015-02-18 DIAGNOSIS — Z1231 Encounter for screening mammogram for malignant neoplasm of breast: Secondary | ICD-10-CM | POA: Diagnosis not present

## 2015-02-19 DIAGNOSIS — Z7901 Long term (current) use of anticoagulants: Secondary | ICD-10-CM | POA: Diagnosis not present

## 2015-03-15 DIAGNOSIS — X32XXXD Exposure to sunlight, subsequent encounter: Secondary | ICD-10-CM | POA: Diagnosis not present

## 2015-03-15 DIAGNOSIS — L570 Actinic keratosis: Secondary | ICD-10-CM | POA: Diagnosis not present

## 2015-03-18 DIAGNOSIS — Z7901 Long term (current) use of anticoagulants: Secondary | ICD-10-CM | POA: Diagnosis not present

## 2015-04-02 DIAGNOSIS — X32XXXD Exposure to sunlight, subsequent encounter: Secondary | ICD-10-CM | POA: Diagnosis not present

## 2015-04-02 DIAGNOSIS — L570 Actinic keratosis: Secondary | ICD-10-CM | POA: Diagnosis not present

## 2015-04-09 DIAGNOSIS — Z7901 Long term (current) use of anticoagulants: Secondary | ICD-10-CM | POA: Diagnosis not present

## 2015-04-09 DIAGNOSIS — Z5181 Encounter for therapeutic drug level monitoring: Secondary | ICD-10-CM | POA: Diagnosis not present

## 2015-04-10 ENCOUNTER — Telehealth: Payer: Self-pay | Admitting: Cardiology

## 2015-04-10 NOTE — Telephone Encounter (Signed)
Calling stating she went into Afib last night around 10:15. HR was up to 103. BP was up and down. Felt weak and tired and HR was fast.  Took extra Flecainide around 10:30 PM. In past Dr. Radford Pax had told her to take an extra Flecainide.  BP at 10:50 after taking Flecainide was 144/98 HR 77. At 11:05 HR was 74.  This AM feels like she is in normal rhythm HR 67.  States she just feels tired and weak but just wanted [place] to be aware.  Advised that she was correct in taking the extra Flecainide. Advised in future if she does go back into Afib to call our office or if at night call the physician on call.  She verbalizes understanding.

## 2015-04-10 NOTE — Telephone Encounter (Signed)
New message     Pt states she was in AFIB last night.  She took an extra flecainide.  This am she is back in sinus rhythum.  Is there anything else she needs to do?

## 2015-04-30 DIAGNOSIS — Z7901 Long term (current) use of anticoagulants: Secondary | ICD-10-CM | POA: Diagnosis not present

## 2015-05-01 DIAGNOSIS — Z23 Encounter for immunization: Secondary | ICD-10-CM | POA: Diagnosis not present

## 2015-05-28 DIAGNOSIS — H52221 Regular astigmatism, right eye: Secondary | ICD-10-CM | POA: Diagnosis not present

## 2015-05-28 DIAGNOSIS — Z7901 Long term (current) use of anticoagulants: Secondary | ICD-10-CM | POA: Diagnosis not present

## 2015-05-28 DIAGNOSIS — H5203 Hypermetropia, bilateral: Secondary | ICD-10-CM | POA: Diagnosis not present

## 2015-05-28 DIAGNOSIS — I10 Essential (primary) hypertension: Secondary | ICD-10-CM | POA: Diagnosis not present

## 2015-05-28 DIAGNOSIS — H35033 Hypertensive retinopathy, bilateral: Secondary | ICD-10-CM | POA: Diagnosis not present

## 2015-06-04 DIAGNOSIS — X32XXXD Exposure to sunlight, subsequent encounter: Secondary | ICD-10-CM | POA: Diagnosis not present

## 2015-06-04 DIAGNOSIS — L820 Inflamed seborrheic keratosis: Secondary | ICD-10-CM | POA: Diagnosis not present

## 2015-06-04 DIAGNOSIS — L570 Actinic keratosis: Secondary | ICD-10-CM | POA: Diagnosis not present

## 2015-06-11 DIAGNOSIS — H903 Sensorineural hearing loss, bilateral: Secondary | ICD-10-CM | POA: Diagnosis not present

## 2015-06-19 DIAGNOSIS — Z0000 Encounter for general adult medical examination without abnormal findings: Secondary | ICD-10-CM | POA: Diagnosis not present

## 2015-06-19 DIAGNOSIS — Z79899 Other long term (current) drug therapy: Secondary | ICD-10-CM | POA: Diagnosis not present

## 2015-06-19 DIAGNOSIS — I129 Hypertensive chronic kidney disease with stage 1 through stage 4 chronic kidney disease, or unspecified chronic kidney disease: Secondary | ICD-10-CM | POA: Diagnosis not present

## 2015-06-19 DIAGNOSIS — N183 Chronic kidney disease, stage 3 (moderate): Secondary | ICD-10-CM | POA: Diagnosis not present

## 2015-06-19 DIAGNOSIS — E039 Hypothyroidism, unspecified: Secondary | ICD-10-CM | POA: Diagnosis not present

## 2015-06-19 DIAGNOSIS — I482 Chronic atrial fibrillation: Secondary | ICD-10-CM | POA: Diagnosis not present

## 2015-06-19 DIAGNOSIS — Z1389 Encounter for screening for other disorder: Secondary | ICD-10-CM | POA: Diagnosis not present

## 2015-06-19 DIAGNOSIS — H9221 Otorrhagia, right ear: Secondary | ICD-10-CM | POA: Diagnosis not present

## 2015-06-20 DIAGNOSIS — H60333 Swimmer's ear, bilateral: Secondary | ICD-10-CM | POA: Diagnosis not present

## 2015-06-25 DIAGNOSIS — Z7901 Long term (current) use of anticoagulants: Secondary | ICD-10-CM | POA: Diagnosis not present

## 2015-07-09 DIAGNOSIS — Z7901 Long term (current) use of anticoagulants: Secondary | ICD-10-CM | POA: Diagnosis not present

## 2015-07-19 DIAGNOSIS — I482 Chronic atrial fibrillation: Secondary | ICD-10-CM | POA: Diagnosis not present

## 2015-07-19 DIAGNOSIS — R300 Dysuria: Secondary | ICD-10-CM | POA: Diagnosis not present

## 2015-07-22 DIAGNOSIS — Z7901 Long term (current) use of anticoagulants: Secondary | ICD-10-CM | POA: Diagnosis not present

## 2015-07-31 ENCOUNTER — Encounter: Payer: Self-pay | Admitting: *Deleted

## 2015-08-06 ENCOUNTER — Encounter: Payer: Self-pay | Admitting: Cardiology

## 2015-08-06 ENCOUNTER — Ambulatory Visit (INDEPENDENT_AMBULATORY_CARE_PROVIDER_SITE_OTHER): Payer: Medicare Other | Admitting: Cardiology

## 2015-08-06 VITALS — BP 128/60 | HR 67 | Ht 64.0 in | Wt 177.0 lb

## 2015-08-06 DIAGNOSIS — I4891 Unspecified atrial fibrillation: Secondary | ICD-10-CM

## 2015-08-06 DIAGNOSIS — I10 Essential (primary) hypertension: Secondary | ICD-10-CM

## 2015-08-06 DIAGNOSIS — I440 Atrioventricular block, first degree: Secondary | ICD-10-CM | POA: Diagnosis not present

## 2015-08-06 NOTE — Progress Notes (Signed)
[name]. [address] [place], [place]  [address] Phone: [phone] Fax:  [phone]  Date:  08/06/2015   ID:  Frances [name], DOB 1934-03-25, MRN [id number]  PCP:  Mathews Argyle, MD   History of Present Illness: Frances [name] is a 81 y.o. female for followup, has paroxysmal atrial fibrillation (2011) and chronic kidney disease stage III. Her GFR is 54. She is on Coumadin for anticoagulation followed by Dr. Felipa Eth.  Her stress test 9/11 is low risk without any evidence of ischemia. Because of this, I am using antiarrhythmic therapy, flecainide. Her ejection fraction was normal, thickness was normal on echocardiogram. Flecainide 100 mg twice a day reduced to 50 mg twice a day on 07/30/14 but this was unsuccessful and only had to move the dose back up to 100 mg twice a day. Treadmill reassuring. No palpitations.    Hypertension-currently well controlled, calcium channel blocker, ACE inhibitor, hydrochlorothiazide. Dr. Felipa Eth.  She told me story about her father who is a vegetarian.  Has sensation of not being able to take in a deep breath. Has always been short of breath she states even as a child.   Her granddaughter, Frances [name], is in [place] , pharmacy student She's had no bleeding with Coumadin.  Wt Readings from Last 3 Encounters:  08/06/15 177 lb (80.287 kg)  01/28/15 177 lb 6.4 oz (80.468 kg)  07/30/14 171 lb (77.565 kg)     Past Medical History  Diagnosis Date  . Thyroid disease     hypothyroidism  . Atrial fibrillation ([place])   . Internal hemorrhoid   . Rectal bleeding   . SVD (spontaneous vaginal delivery) 1957, 1959    x 2  . Peripheral vascular disease ([place])     right arm and right shoulder blood clots r/t a fall  . Hypothyroidism   . Hypertension     controlled with meds  . Dysrhythmia     Hx - a-fib 05/2010 - tx with meds, no problem since 05/2010  . Chronic kidney disease     stage 3 renal disease - no med  . Arthritis       hands, back  . Hearing loss     bilateral hearing aids    Past Surgical History  Procedure Laterality Date  . Tonsillectomy    . Appendectomy    . Cystocele repair    . Joint replacement      left thumb replacement  . Colonoscopy    . Hand surgery      left  . Hemorrhoid injection  04/2011  . Dental surgery      infected tooth - general  . Dilation and curettage of uterus      x several  . Abdominal hysterectomy      and right ovary  . Back surgery      x 3 - 2 rods and 8 screws  . Anterior and posterior repair N/A 01/03/2014    Procedure: ANTERIOR (CYSTOCELE) REPAIR;  Surgeon: Floyce Stakes. Pamala Hurry, MD;  Location: [facility];  Service: Gynecology;  Laterality: N/A;  . Examination under anesthesia N/A 01/15/2014    Procedure: EXAM UNDER ANESTHESIA with evacuation of hematoma and over sewing of vaginal mucosa.;  Surgeon: Floyce Stakes. Pamala Hurry, MD;  Location: [facility];  Service: Gynecology;  Laterality: N/A;    Current Outpatient Prescriptions  Medication Sig Dispense Refill  . Calcium Carbonate-Vitamin D (CALCIUM-VITAMIN D) 500-200 MG-UNIT per tablet Take 1 tablet by mouth  daily at 12 noon.     . diltiazem (CARDIZEM CD) 180 MG 24 hr capsule Take 1 capsule (180 mg total) by mouth daily.    . fish oil-omega-3 fatty acids 1000 MG capsule Take 1 g by mouth daily.     . flecainide (TAMBOCOR) 100 MG tablet Take 1 tablet (100 mg total) by mouth 2 (two) times daily. TAKE ONE TABLET BY MOUTH EVERY TWELVE HOURS 180 tablet 3  . hydrochlorothiazide (HYDRODIURIL) 25 MG tablet Take 25 mg by mouth daily.    Marland Kitchen levothyroxine (SYNTHROID, LEVOTHROID) 75 MCG tablet Take 75 mcg by mouth daily.      . Multiple Vitamin (MULTIVITAMIN) tablet Take 0.5 tablets by mouth 2 (two) times daily.     . Probiotic Product (PROBIOTIC PO) Take 1 tablet by mouth daily.     . ramipril (ALTACE) 10 MG tablet Take 10 mg by mouth daily.      . vitamin C (ASCORBIC ACID) 500 MG tablet Take 500 mg by mouth 2 (two) times daily.     Marland Kitchen  warfarin (COUMADIN) 3 MG tablet Taking as directed...currently 3 mg and 2 mg by mouth     No current facility-administered medications for this visit.    Allergies:    Allergies  Allergen Reactions  . Pneumovax 23 [Pneumococcal Vac Polyvalent] Other (See Comments)    Arm swelling & fever  . Codeine Nausea And Vomiting  . Cephalexin Anxiety  . Sudafed [Pseudoephedrine Hcl] Anxiety  . Sulfa Antibiotics Rash    Social History:  The patient  reports that she has never smoked. She has never used smokeless tobacco. She reports that she does not drink alcohol or use illicit drugs.   ROS:  Please see the history of present illness.   No bleeding, no syncope, no orthopnea, no PND  PHYSICAL EXAM: VS:  BP 128/60 mmHg  Pulse 67  Ht 5\' 4"  (1.626 m)  Wt 177 lb (80.287 kg)  BMI 30.37 kg/m2  SpO2 96% Well nourished, well developed, in no acute distress HEENT: normal Neck: no JVD Cardiac:  normal S1, S2; RRR; no murmur Lungs:  clear to auscultation bilaterally, no wheezing, rhonchi or rales Ext: no edema Skin: warm and dry Neuro: no focal abnormalities noted  EKG:   Today 08/06/15-sinus rhythm, first-degree AV block, PR interval 238 ms, ventricular rate 70 bpm, left axis deviation. 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 No significant change from prior personally viewed-07/30/14-sinus rhythm, first-degree AV block, left anterior fascicular block, poor R-wave progression, septal infarct pattern. Prior EKG-Sinus rhythm, first degree AV block, 220 ms, heart rate of 60, normal QRS duration, left axis deviation, poor R wave progression.     ASSESSMENT AND PLAN:  1. Paroxysmal atrial fibrillation-currently doing well with anticoagulation, antiarrhythmic, flecainide 100mg  BID. Low-dose diltiazem 120 CD. We tried to decrease dose to 50 BID but she had another episode. In 10/2014 episode at night, Dr. Radford Pax on call had her take 50mg  extra flec. Worked. Her sister also has atrial fibrillation. Diltiazem has been  reduced in the past because of pause at night. No pacemaker requirements. Cardiograph program.  I did describe to her that it sometime in the future of pacemaker may be warranted because of her underlying conduction abnormality. 2. On anticoagulation-continue warfarin. No bleeding, Dr. Felipa Eth monitoring.  3. First degree AV block - chronic. Monitoring closely especially with agents used to monitor atrial fibrillation.   She did bring up previously that she was not ready to take any novel anticoagulation agents.  Brought up the commercials. 4. Dyspnea - resolved. Always has been short winded.  has had bout of asthma bronchitic. She has sensation of not being able to take in a deep breath (restrictive like).  5. 6 month follow up  Signed, [name], MD [facility]  08/06/2015 2:50 PM

## 2015-08-06 NOTE — Patient Instructions (Signed)
Your physician recommends that you continue on your current medications as directed. Please refer to the Current Medication list given to you today.  Your physician wants you to follow-up in: 6 months with Dr. Skains. You will receive a reminder letter in the mail two months in advance. If you don't receive a letter, please call our office to schedule the follow-up appointment.  

## 2015-08-12 ENCOUNTER — Telehealth: Payer: Self-pay | Admitting: Cardiology

## 2015-08-12 NOTE — Telephone Encounter (Signed)
Discussed with Dr. Marlou Porch Appointment made with Butch Penny at [facility] clinic as ordered by Dr. Marlou Porch Instructed patient to go to [facility] for her appointment at 130pm Wednesday, 08/14/15 with Butch Penny NP-C Given instructions on where to go and parking along with the code 0009 Phone number given for afib clinic: [phone] Repeated instructions correctly

## 2015-08-12 NOTE — Telephone Encounter (Signed)
New message     Pt has afib.  She had a "big time" episode yesterday. Her HR was 112 when she went to bed last night.  She took an extra flecainide.  This am---HR is better.  Please advise

## 2015-08-12 NOTE — Telephone Encounter (Signed)
Returned patient phone call Yesterday at 1pm while in kitchen felt HR increase; she sat down and rested At 2pm her HR was 138 and she tool an extra Flecainide  Her heart rate was irreg all day long running between 107-120 She felt light headed through out the day.  BP was 103/70 She took her normal dose of flecainide at dinner time Last night at bedtime heart rate was irregular at 112-118 beats per minute This morning her hear rate is still irregular at 75-81

## 2015-08-14 ENCOUNTER — Ambulatory Visit (HOSPITAL_COMMUNITY)
Admission: RE | Admit: 2015-08-14 | Discharge: 2015-08-14 | Disposition: A | Discharge status: Home / Self Care | Payer: Medicare Other | Source: Ambulatory Visit | Attending: Nurse Practitioner | Admitting: Nurse Practitioner

## 2015-08-14 ENCOUNTER — Encounter (HOSPITAL_COMMUNITY): Payer: Self-pay | Admitting: Nurse Practitioner

## 2015-08-14 VITALS — BP 130/58 | HR 71 | Ht 65.0 in | Wt 179.6 lb

## 2015-08-14 DIAGNOSIS — I480 Paroxysmal atrial fibrillation: Secondary | ICD-10-CM | POA: Insufficient documentation

## 2015-08-14 NOTE — Progress Notes (Signed)
Patient ID: Frances [name], female   DOB: 15-Apr-1934, 81 y.o.   MRN: [id number]     Primary Care Physician: Mathews Argyle, MD Referring Physician: Dr. Criss Alvine is a 81 y.o. female with a h/o PAF dating back to 2011 and has been on flecainide 100 mg bid with good control of afib. Over the last few months, she has had one breakthrough afib episode a month. Usually lasting around 12 hours at the longest. She will take an extra 100 mg of flecanide and this will hasten her return to SR. She is here today to discuss other options. AAD therapy discussed with pt, including amiodarone and tikosyn. She currently is not in agreement that she would like to try either drug. Also, discussed ablation since by guidelines, she is symptomatic and has tried one antiarrythmic. Has not had an echo since 2011 and I cannot find in the [facility] system. Thinks she may snore, and husband has reported that she may stop breathing at times. No alcohol, caffeine.  Today, she denies symptoms of palpitations, chest pain, shortness of breath, orthopnea, PND, lower extremity edema, dizziness, presyncope, syncope, or neurologic sequela. The patient is tolerating medications without difficulties and is otherwise without complaint today.   Past Medical History  Diagnosis Date  . Thyroid disease     hypothyroidism  . Atrial fibrillation ([place])   . Internal hemorrhoid   . Rectal bleeding   . SVD (spontaneous vaginal delivery) 1957, 1959    x 2  . Peripheral vascular disease ([place])     right arm and right shoulder blood clots r/t a fall  . Hypothyroidism   . Hypertension     controlled with meds  . Dysrhythmia     Hx - a-fib 05/2010 - tx with meds, no problem since 05/2010  . Chronic kidney disease     stage 3 renal disease - no med  . Arthritis     hands, back  . Hearing loss     bilateral hearing aids   Past Surgical History  Procedure Laterality Date  . Tonsillectomy    . Appendectomy    .  Cystocele repair    . Joint replacement      left thumb replacement  . Colonoscopy    . Hand surgery      left  . Hemorrhoid injection  04/2011  . Dental surgery      infected tooth - general  . Dilation and curettage of uterus      x several  . Abdominal hysterectomy      and right ovary  . Back surgery      x 3 - 2 rods and 8 screws  . Anterior and posterior repair N/A 01/03/2014    Procedure: ANTERIOR (CYSTOCELE) REPAIR;  Surgeon: Floyce Stakes. Pamala Hurry, MD;  Location: [facility];  Service: Gynecology;  Laterality: N/A;  . Examination under anesthesia N/A 01/15/2014    Procedure: EXAM UNDER ANESTHESIA with evacuation of hematoma and over sewing of vaginal mucosa.;  Surgeon: Floyce Stakes. Pamala Hurry, MD;  Location: [facility];  Service: Gynecology;  Laterality: N/A;    Current Outpatient Prescriptions  Medication Sig Dispense Refill  . Calcium Carbonate-Vitamin D (CALCIUM-VITAMIN D) 500-200 MG-UNIT per tablet Take 1 tablet by mouth daily at 12 noon.     . diltiazem (CARDIZEM CD) 180 MG 24 hr capsule Take 1 capsule (180 mg total) by mouth daily.    . fish oil-omega-3 fatty acids 1000 MG capsule Take  1 g by mouth daily.     . flecainide (TAMBOCOR) 100 MG tablet Take 1 tablet (100 mg total) by mouth 2 (two) times daily. TAKE ONE TABLET BY MOUTH EVERY TWELVE HOURS 180 tablet 3  . hydrochlorothiazide (HYDRODIURIL) 25 MG tablet Take 25 mg by mouth daily.    Marland Kitchen levothyroxine (SYNTHROID, LEVOTHROID) 75 MCG tablet Take 75 mcg by mouth daily.      . Multiple Vitamin (MULTIVITAMIN) tablet Take 0.5 tablets by mouth 2 (two) times daily.     . Probiotic Product (PROBIOTIC PO) Take 1 tablet by mouth daily.     . ramipril (ALTACE) 10 MG tablet Take 10 mg by mouth daily.      . vitamin C (ASCORBIC ACID) 500 MG tablet Take 500 mg by mouth 2 (two) times daily.     Marland Kitchen warfarin (COUMADIN) 3 MG tablet Taking as directed...currently 3 mg and 2 mg by mouth     No current facility-administered medications for this encounter.     Allergies  Allergen Reactions  . Pneumovax 23 [Pneumococcal Vac Polyvalent] Other (See Comments)    Arm swelling & fever  . Codeine Nausea And Vomiting  . Cephalexin Anxiety  . Sudafed [Pseudoephedrine Hcl] Anxiety  . Sulfa Antibiotics Rash    Social History   Social History  . Marital Status: Married    Spouse Name: N/A  . Number of Children: N/A  . Years of Education: N/A   Occupational History  . Not on file.   Social History Main Topics  . Smoking status: Never Smoker   . Smokeless tobacco: Never Used  . Alcohol Use: No  . Drug Use: No  . Sexual Activity: Not Currently    Birth Control/ Protection: Post-menopausal   Other Topics Concern  . Not on file   Social History Narrative    Family History  Problem Relation Age of Onset  . Heart disease Mother   . Hypertension Father   . Diabetes Father   . Heart disease Sister   . Hypertension Sister   . Heart disease Brother   . Heart attack Mother   . Heart attack Sister   . Heart failure Brother   . Sudden death Maternal Grandfather   . Stroke Father     ROS- All systems are reviewed and negative except as per the HPI above  Physical Exam: Filed Vitals:   08/14/15 1337  BP: 130/58  Pulse: 71  Height: 5\' 5"  (1.651 m)  Weight: 179 lb 9.6 oz (81.466 kg)    GEN- The patient is well appearing, alert and oriented x 3 today.   Head- normocephalic, atraumatic Eyes-  Sclera clear, conjunctiva pink Ears- hearing intact Oropharynx- clear Neck- supple, no JVP Lymph- no cervical lymphadenopathy Lungs- Clear to ausculation bilaterally, normal work of breathing Heart- Regular rate and rhythm, no murmurs, rubs or gallops, PMI not laterally displaced GI- soft, NT, ND, + BS Extremities- no clubbing, cyanosis, or edema MS- no significant deformity or atrophy Skin- no rash or lesion Psych- euthymic mood, full affect Neuro- strength and sensation are intact  EKG-NSR with first degree AV block and IRBBB,  septal infarct, age undetermined.pr int 232 ms, QRS int 94 ms, QTc 447 ms. Epic records reviewed  Assessment and Plan: 1. PAF Having some breakthrough afib but would not at this point say that pt is failing drug. Amiodarone/tikosyn discussed but pt would like to think about it more Ablation also discussed but would need a current echo to further  see if she would be a good candidate, again she is not sure if she would go this route She would like to mull over her option with her husband and will let me know her thoughts. Continue flecainide 100 mg bid Continue warfarin, pt was informed of DOAC's being alternative  Will see as needed when pt is sure if she would like to change the course.  Geroge Baseman Saliou Barnier, [facility] [address] [place], [place] [address] [phone]

## 2015-08-14 NOTE — Addendum Note (Signed)
Encounter addended by: Sherran Needs, NP on: 08/14/2015  5:06 PM<BR>     Documentation filed: Follow-up Section, LOS Section

## 2015-08-20 DIAGNOSIS — Z7901 Long term (current) use of anticoagulants: Secondary | ICD-10-CM | POA: Diagnosis not present

## 2015-08-26 ENCOUNTER — Other Ambulatory Visit: Payer: Self-pay | Admitting: Cardiology

## 2015-08-29 ENCOUNTER — Other Ambulatory Visit: Payer: Self-pay | Admitting: *Deleted

## 2015-08-29 DIAGNOSIS — I4891 Unspecified atrial fibrillation: Secondary | ICD-10-CM

## 2015-09-03 DIAGNOSIS — J040 Acute laryngitis: Secondary | ICD-10-CM | POA: Diagnosis not present

## 2015-09-03 DIAGNOSIS — R21 Rash and other nonspecific skin eruption: Secondary | ICD-10-CM | POA: Diagnosis not present

## 2015-09-17 DIAGNOSIS — Z7901 Long term (current) use of anticoagulants: Secondary | ICD-10-CM | POA: Diagnosis not present

## 2015-09-23 ENCOUNTER — Telehealth (HOSPITAL_COMMUNITY): Payer: Self-pay | Admitting: *Deleted

## 2015-09-23 ENCOUNTER — Other Ambulatory Visit (HOSPITAL_COMMUNITY): Payer: Self-pay | Admitting: *Deleted

## 2015-09-23 DIAGNOSIS — I4819 Other persistent atrial fibrillation: Secondary | ICD-10-CM

## 2015-09-23 NOTE — Telephone Encounter (Signed)
Pt called in stating she had decided to pursue ablation and wanted to know next steps.  Informed would have echo completed prior to appt with Dr. Rayann Heman to discuss. Message sent to scheduler and echo appt made. Pt in agreement.

## 2015-09-24 DIAGNOSIS — Z7901 Long term (current) use of anticoagulants: Secondary | ICD-10-CM | POA: Diagnosis not present

## 2015-09-30 ENCOUNTER — Ambulatory Visit (HOSPITAL_COMMUNITY)
Admission: RE | Admit: 2015-09-30 | Discharge: 2015-09-30 | Disposition: A | Discharge status: Home / Self Care | Payer: Medicare Other | Source: Ambulatory Visit | Attending: Nurse Practitioner | Admitting: Nurse Practitioner

## 2015-09-30 DIAGNOSIS — I129 Hypertensive chronic kidney disease with stage 1 through stage 4 chronic kidney disease, or unspecified chronic kidney disease: Secondary | ICD-10-CM | POA: Insufficient documentation

## 2015-09-30 DIAGNOSIS — I5189 Other ill-defined heart diseases: Secondary | ICD-10-CM | POA: Diagnosis not present

## 2015-09-30 DIAGNOSIS — I340 Nonrheumatic mitral (valve) insufficiency: Secondary | ICD-10-CM | POA: Insufficient documentation

## 2015-09-30 DIAGNOSIS — I4891 Unspecified atrial fibrillation: Secondary | ICD-10-CM | POA: Diagnosis present

## 2015-09-30 DIAGNOSIS — I481 Persistent atrial fibrillation: Secondary | ICD-10-CM

## 2015-09-30 DIAGNOSIS — I517 Cardiomegaly: Secondary | ICD-10-CM | POA: Diagnosis not present

## 2015-09-30 DIAGNOSIS — N189 Chronic kidney disease, unspecified: Secondary | ICD-10-CM | POA: Diagnosis not present

## 2015-09-30 DIAGNOSIS — I4819 Other persistent atrial fibrillation: Secondary | ICD-10-CM

## 2015-09-30 NOTE — Progress Notes (Signed)
  Echocardiogram 2D Echocardiogram has been performed.  Diamond Nickel 09/30/2015, 12:10 PM

## 2015-10-07 ENCOUNTER — Ambulatory Visit (INDEPENDENT_AMBULATORY_CARE_PROVIDER_SITE_OTHER): Payer: Medicare Other | Admitting: Internal Medicine

## 2015-10-07 ENCOUNTER — Encounter: Payer: Self-pay | Admitting: Internal Medicine

## 2015-10-07 VITALS — BP 142/80 | HR 67 | Ht 64.5 in | Wt 179.6 lb

## 2015-10-07 DIAGNOSIS — I480 Paroxysmal atrial fibrillation: Secondary | ICD-10-CM

## 2015-10-07 DIAGNOSIS — I10 Essential (primary) hypertension: Secondary | ICD-10-CM | POA: Diagnosis not present

## 2015-10-07 NOTE — Patient Instructions (Signed)
Medication Instructions:  Your physician recommends that you continue on your current medications as directed. Please refer to the Current Medication list given to you today.   Labwork: None ordered   Testing/Procedures: Your physician has recommended that you have an ablation. Catheter ablation is a medical procedure used to treat some cardiac arrhythmias (irregular heartbeats). During catheter ablation, a long, thin, flexible tube is put into a blood vessel in your groin (upper thigh), or neck. This tube is called an ablation catheter. It is then guided to your heart through the blood vessel. Radio frequency waves destroy small areas of heart tissue where abnormal heartbeats may cause an arrhythmia to start. Please see the instruction sheet given to you today.    Follow-Up: Your physician recommends that you schedule a follow-up appointment in: 4 weeks with Roderic Palau, NP     Any Other Special Instructions Will Be Listed Below (If Applicable).  Medications he talked about were Multaq and Sotalol     If you need a refill on your cardiac medications before your next appointment, please call your pharmacy.

## 2015-10-07 NOTE — Progress Notes (Signed)
Electrophysiology Office Note   Date:  10/07/2015   ID:  MIRZA HRON, DOB 27-Feb-1934, MRN [id number]  PCP:  Mathews Argyle, MD  Cardiologist:  Dr Marlou Porch Primary Electrophysiologist: [name], MD    Chief Complaint  Patient presents with  . Atrial Fibrillation     History of Present Illness: Frances [name] is a 82 y.o. female who presents today for electrophysiology evaluation.   The patient reports initially being diagnosed with atrial fibrillation in 2011.  In retrospect, she feels that she has had afib since at least 2005.  She has difficulty articulating her afib symptoms but seems to have primarily dizziness, diaphoresis and fatigue.  Episodes have increased in frequency and duration despite medical therapy with flecainide.  She presents for further EP consultation.  Today, she denies symptoms of chest pain, shortness of breath, orthopnea, PND, lower extremity edema, claudication, presyncope, syncope, bleeding, or neurologic sequela. The patient is tolerating medications without difficulties and is otherwise without complaint today.    Past Medical History  Diagnosis Date  . Thyroid disease     hypothyroidism  . Paroxysmal atrial fibrillation (HCC)   . Internal hemorrhoid   . Rectal bleeding   . SVD (spontaneous vaginal delivery) 1957, 1959    x 2  . Peripheral vascular disease ([place])     right arm and right shoulder blood clots r/t a fall  . Hypothyroidism   . Hypertension     controlled with meds  . Dysrhythmia     Hx - a-fib 05/2010 - tx with meds, no problem since 05/2010  . Chronic kidney disease     stage 3 renal disease - no med  . Arthritis     hands, back  . Hearing loss     bilateral hearing aids   Past Surgical History  Procedure Laterality Date  . Tonsillectomy    . Appendectomy    . Cystocele repair    . Joint replacement      left thumb replacement  . Colonoscopy    . Hand surgery      left  . Hemorrhoid injection  04/2011    . Dental surgery      infected tooth - general  . Dilation and curettage of uterus      x several  . Abdominal hysterectomy      and right ovary  . Back surgery      x 3 - 2 rods and 8 screws  . Anterior and posterior repair N/A 01/03/2014    Procedure: ANTERIOR (CYSTOCELE) REPAIR;  Surgeon: Floyce Stakes. Pamala Hurry, MD;  Location: [facility];  Service: Gynecology;  Laterality: N/A;  . Examination under anesthesia N/A 01/15/2014    Procedure: EXAM UNDER ANESTHESIA with evacuation of hematoma and over sewing of vaginal mucosa.;  Surgeon: Floyce Stakes. Pamala Hurry, MD;  Location: [facility];  Service: Gynecology;  Laterality: N/A;     Current Outpatient Prescriptions  Medication Sig Dispense Refill  . Calcium Carbonate-Vitamin D (CALCIUM-VITAMIN D) 500-200 MG-UNIT per tablet Take 1 tablet by mouth daily at 12 noon.     . diltiazem (CARDIZEM CD) 180 MG 24 hr capsule Take 1 capsule (180 mg total) by mouth daily.    . fish oil-omega-3 fatty acids 1000 MG capsule Take 1 g by mouth daily.     . flecainide (TAMBOCOR) 100 MG tablet TAKE 1 TABLET BY MOUTH TWICE A DAY 180 tablet 2  . hydrochlorothiazide (HYDRODIURIL) 25 MG tablet Take 25 mg by mouth daily.    Marland Kitchen  levothyroxine (SYNTHROID, LEVOTHROID) 75 MCG tablet Take 75 mcg by mouth daily.      . Multiple Vitamin (MULTIVITAMIN) tablet Take 0.5 tablets by mouth 2 (two) times daily.     . NON FORMULARY Take by mouth 2 (two) times daily. Black Cohash for hot flashes - Take as directed    . Probiotic Product (PROBIOTIC PO) Take 1 tablet by mouth daily.     . ramipril (ALTACE) 10 MG tablet Take 10 mg by mouth daily.      . vitamin C (ASCORBIC ACID) 500 MG tablet Take 500 mg by mouth 2 (two) times daily.     Marland Kitchen warfarin (COUMADIN) 3 MG tablet Taking as directed by the coumadin clinic     No current facility-administered medications for this visit.    Allergies:   Pneumovax 23; Codeine; Cephalexin; Sudafed; and Sulfa antibiotics   Social History:  The patient  reports that  she has never smoked. She has never used smokeless tobacco. She reports that she does not drink alcohol or use illicit drugs.   Family History:  The patient's  family history includes Diabetes in her father; Heart attack in her mother and sister; Heart disease in her brother, mother, and sister; Heart failure in her brother; Hypertension in her father and sister; Stroke in her father; Sudden death in her maternal grandfather.    ROS:  Please see the history of present illness.   All other systems are reviewed and negative.    PHYSICAL EXAM: VS:  BP 142/80 mmHg  Pulse 67  Ht 5' 4.5" (1.638 m)  Wt 179 lb 9.6 oz (81.466 kg)  BMI 30.36 kg/m2 , BMI Body mass index is 30.36 kg/(m^2). GEN: Well nourished, well developed, in no acute distress HEENT: normal Neck: no JVD, carotid bruits, or masses Cardiac: RRR; no murmurs, rubs, or gallops,no edema  Respiratory:  clear to auscultation bilaterally, normal work of breathing GI: soft, nontender, nondistended, + BS MS: no deformity or atrophy Skin: warm and dry  Neuro:  Strength and sensation are intact Psych: euthymic mood, full affect  EKG:  EKG is ordered today. The ekg ordered today shows sinus rhythm 67 bpm, PR 252 msec, QRS 104, Qtc 435 msec, incomplete RBBB, lAHB   Recent Labs: No results found for requested labs within last 365 days.    Lipid Panel  No results found for: CHOL, TRIG, HDL, CHOLHDL, VLDL, LDLCALC, LDLDIRECT   Wt Readings from Last 3 Encounters:  10/07/15 179 lb 9.6 oz (81.466 kg)  08/14/15 179 lb 9.6 oz (81.466 kg)  08/06/15 177 lb (80.287 kg)      Other studies Reviewed: Additional studies/ records that were reviewed today include: afib clinic notes, prior echo, Dr Marlou Porch notes  Review of the above records today demonstrates: preserved EF, normal LA size   ASSESSMENT AND PLAN:  1.  Paroxysmal atrial fibrillation The patient has symptomatic atrial fibrillation.  She has failed medical therapy with  flecainide and diltiazem.  Therapeutic strategies for afib including medicine and ablation were discussed in detail with the patient today. Risk, benefits, and alternatives to EP study and radiofrequency ablation for afib were also discussed in detail today.  The patient is very reluctant to consider ablation though I do think that this would be a very good option for her.  We discussed multaq and sotalol as alternatives (she did not like prior suggestions of tikosyn or amiodarone by AF clinic).  Given AV conduction abnormality, she is not a candidate for  increasing flecainide.  She will contemplate her options and contact our office if she decides to proceed with ablation. Otherwise, she will continue to follow in the AF clinic for management. chads2vasc score is at least 4.  Today, I discussed Coumadin as well as novel anticoagulants including Pradaxa, Xarelto, Savaysa, and Eliquis today as indicated for risk reduction in stroke and systemic emboli with nonvalvular atrial fibrillation.  Risks, benefits, and alternatives to each of these drugs were discussed at length today. She is very clear that she would prefer to continue coumadin due to concerns from "lawyer adds on tv".  2. HTN Stable No change required today   Follow-up with Butch Penny in the AF clinic in 4 weeks for further AF discussions. I will see when needed  Current medicines are reviewed at length with the patient today.   The patient does not have concerns regarding her medicines.  The following changes were made today:  none   Signed, [name], MD  10/07/2015 11:16 AM     [facility] [address] [place] [address] [phone] (office) [phone] (fax)

## 2015-10-09 ENCOUNTER — Telehealth: Payer: Self-pay | Admitting: Internal Medicine

## 2015-10-09 DIAGNOSIS — I480 Paroxysmal atrial fibrillation: Secondary | ICD-10-CM

## 2015-10-09 NOTE — Telephone Encounter (Signed)
New message      Calling to tell pt which medication she want to go on and also has questions for the nurse

## 2015-10-09 NOTE — Telephone Encounter (Signed)
Returned call to patient and she had decided to take Sotalol.  I let her know I would forward to Dr Rayann Heman to find out dose and how long to stop her Flecainide prior to starting.  She is aware I will call her back with recommendations.

## 2015-10-15 DIAGNOSIS — Z7901 Long term (current) use of anticoagulants: Secondary | ICD-10-CM | POA: Diagnosis not present

## 2015-10-18 ENCOUNTER — Encounter: Payer: Self-pay | Admitting: Internal Medicine

## 2015-10-18 NOTE — Telephone Encounter (Signed)
This encounter was created in error - please disregard.

## 2015-10-18 NOTE — Telephone Encounter (Addendum)
Frances [name] at 10/18/2015 9:49 AM     Status: Signed       Expand All Collapse All   New message     Patient calling change her about something wants to discuss with nurse.        Spoke with patient and she has decieded after speaking with multiple people to proceed with the ablation.  I will forward to Dr Rayann Heman to see if she needs either a TEE or a cardiac CT prior to the ablation.  She will also need to have weekly INR's (looks like they are done by PCP)  She is going to come in on 11/14/15 for labs at 11 and I will give her an instruction sheet at that time.  I have scheduled her for 11/28/15 second case

## 2015-10-18 NOTE — Telephone Encounter (Signed)
New message     Patient calling change her about something wants to discuss with nurse.

## 2015-10-28 ENCOUNTER — Ambulatory Visit (HOSPITAL_BASED_OUTPATIENT_CLINIC_OR_DEPARTMENT_OTHER): Payer: Medicare Other | Attending: Cardiology | Admitting: Radiology

## 2015-10-28 ENCOUNTER — Telehealth: Payer: Self-pay | Admitting: Physician Assistant

## 2015-10-28 DIAGNOSIS — I4891 Unspecified atrial fibrillation: Secondary | ICD-10-CM | POA: Diagnosis not present

## 2015-10-28 DIAGNOSIS — G4719 Other hypersomnia: Secondary | ICD-10-CM | POA: Diagnosis not present

## 2015-10-28 DIAGNOSIS — Z7901 Long term (current) use of anticoagulants: Secondary | ICD-10-CM | POA: Insufficient documentation

## 2015-10-28 DIAGNOSIS — R55 Syncope and collapse: Secondary | ICD-10-CM | POA: Insufficient documentation

## 2015-10-28 DIAGNOSIS — R0683 Snoring: Secondary | ICD-10-CM | POA: Diagnosis not present

## 2015-10-28 DIAGNOSIS — I10 Essential (primary) hypertension: Secondary | ICD-10-CM | POA: Insufficient documentation

## 2015-10-28 DIAGNOSIS — Z79899 Other long term (current) drug therapy: Secondary | ICD-10-CM | POA: Diagnosis not present

## 2015-10-28 NOTE — Telephone Encounter (Signed)
Mr. Frances [name] is a 82 year old female with past medical history of atrial fibrillation on flecainide and Coumadin. She is also taking 180mg  diltiazem CD for rate control. She is pending A. fib ablation in March. She states she went into atrial fibrillation around 5:30 PM today. Her blood pressure has been in the 150 to 120s range. She is also pending sleep study overnight.  I have instructed her to take an additional 100 mg of flecainide in hope to convert her. She has been compliant with Coumadin therapy, however last INR last week was 2.7 at her PCPs office. I told her if she does not convert within 2 hours, she can take additional dose of diltiazem for better rate control. As for her sleep study, she will need to contact sleep study Center to see if they can do the sleep study while she is in A. Fib.  Frances Corrigan PA Pager: [phone]

## 2015-10-29 ENCOUNTER — Encounter: Payer: Self-pay | Admitting: *Deleted

## 2015-10-29 ENCOUNTER — Other Ambulatory Visit: Payer: Self-pay | Admitting: *Deleted

## 2015-10-29 ENCOUNTER — Telehealth: Payer: Self-pay | Admitting: Cardiology

## 2015-10-29 ENCOUNTER — Emergency Department (HOSPITAL_COMMUNITY)
Admission: EM | Admit: 2015-10-29 | Discharge: 2015-10-29 | Disposition: A | Discharge status: Home / Self Care | Payer: Medicare Other | Attending: Emergency Medicine | Admitting: Emergency Medicine

## 2015-10-29 ENCOUNTER — Encounter (HOSPITAL_COMMUNITY): Payer: Self-pay | Admitting: Emergency Medicine

## 2015-10-29 ENCOUNTER — Emergency Department (HOSPITAL_COMMUNITY): Payer: Medicare Other

## 2015-10-29 DIAGNOSIS — Z90710 Acquired absence of both cervix and uterus: Secondary | ICD-10-CM | POA: Insufficient documentation

## 2015-10-29 DIAGNOSIS — Z7901 Long term (current) use of anticoagulants: Secondary | ICD-10-CM | POA: Diagnosis not present

## 2015-10-29 DIAGNOSIS — H9193 Unspecified hearing loss, bilateral: Secondary | ICD-10-CM | POA: Diagnosis not present

## 2015-10-29 DIAGNOSIS — Z9049 Acquired absence of other specified parts of digestive tract: Secondary | ICD-10-CM | POA: Diagnosis not present

## 2015-10-29 DIAGNOSIS — R42 Dizziness and giddiness: Secondary | ICD-10-CM | POA: Diagnosis not present

## 2015-10-29 DIAGNOSIS — Z79899 Other long term (current) drug therapy: Secondary | ICD-10-CM | POA: Diagnosis not present

## 2015-10-29 DIAGNOSIS — I480 Paroxysmal atrial fibrillation: Secondary | ICD-10-CM | POA: Diagnosis not present

## 2015-10-29 DIAGNOSIS — M19042 Primary osteoarthritis, left hand: Secondary | ICD-10-CM | POA: Insufficient documentation

## 2015-10-29 DIAGNOSIS — M47897 Other spondylosis, lumbosacral region: Secondary | ICD-10-CM | POA: Insufficient documentation

## 2015-10-29 DIAGNOSIS — N183 Chronic kidney disease, stage 3 (moderate): Secondary | ICD-10-CM | POA: Diagnosis not present

## 2015-10-29 DIAGNOSIS — E039 Hypothyroidism, unspecified: Secondary | ICD-10-CM | POA: Insufficient documentation

## 2015-10-29 DIAGNOSIS — R55 Syncope and collapse: Secondary | ICD-10-CM

## 2015-10-29 DIAGNOSIS — R109 Unspecified abdominal pain: Secondary | ICD-10-CM | POA: Insufficient documentation

## 2015-10-29 DIAGNOSIS — G4733 Obstructive sleep apnea (adult) (pediatric): Secondary | ICD-10-CM

## 2015-10-29 DIAGNOSIS — R1111 Vomiting without nausea: Secondary | ICD-10-CM

## 2015-10-29 DIAGNOSIS — M19041 Primary osteoarthritis, right hand: Secondary | ICD-10-CM | POA: Diagnosis not present

## 2015-10-29 DIAGNOSIS — I129 Hypertensive chronic kidney disease with stage 1 through stage 4 chronic kidney disease, or unspecified chronic kidney disease: Secondary | ICD-10-CM | POA: Diagnosis not present

## 2015-10-29 DIAGNOSIS — R404 Transient alteration of awareness: Secondary | ICD-10-CM | POA: Diagnosis not present

## 2015-10-29 LAB — I-STAT CHEM 8, ED
BUN: 19 mg/dL (ref 6–20)
CHLORIDE: 104 mmol/L (ref 101–111)
Calcium, Ion: 1.16 mmol/L (ref 1.13–1.30)
Creatinine, Ser: 0.9 mg/dL (ref 0.44–1.00)
GLUCOSE: 145 mg/dL — AB (ref 65–99)
HCT: 48 % — ABNORMAL HIGH (ref 36.0–46.0)
Hemoglobin: 16.3 g/dL — ABNORMAL HIGH (ref 12.0–15.0)
POTASSIUM: 3.6 mmol/L (ref 3.5–5.1)
SODIUM: 145 mmol/L (ref 135–145)
TCO2: 25 mmol/L (ref 0–100)

## 2015-10-29 LAB — CBC WITH DIFFERENTIAL/PLATELET
BASOS ABS: 0.1 10*3/uL (ref 0.0–0.1)
Basophils Relative: 1 %
EOS PCT: 1 %
Eosinophils Absolute: 0.1 10*3/uL (ref 0.0–0.7)
HCT: 45.5 % (ref 36.0–46.0)
Hemoglobin: 15.1 g/dL — ABNORMAL HIGH (ref 12.0–15.0)
LYMPHS PCT: 16 %
Lymphs Abs: 1.5 10*3/uL (ref 0.7–4.0)
MCH: 28.9 pg (ref 26.0–34.0)
MCHC: 33.2 g/dL (ref 30.0–36.0)
MCV: 87 fL (ref 78.0–100.0)
Monocytes Absolute: 0.6 10*3/uL (ref 0.1–1.0)
Monocytes Relative: 7 %
Neutro Abs: 7.4 10*3/uL (ref 1.7–7.7)
Neutrophils Relative %: 77 %
PLATELETS: 213 10*3/uL (ref 150–400)
RBC: 5.23 MIL/uL — AB (ref 3.87–5.11)
RDW: 14.3 % (ref 11.5–15.5)
WBC: 9.7 10*3/uL (ref 4.0–10.5)

## 2015-10-29 LAB — PROTIME-INR
INR: 2.17 — AB (ref 0.00–1.49)
Prothrombin Time: 24 seconds — ABNORMAL HIGH (ref 11.6–15.2)

## 2015-10-29 LAB — I-STAT TROPONIN, ED: TROPONIN I, POC: 0.02 ng/mL (ref 0.00–0.08)

## 2015-10-29 MED ORDER — ONDANSETRON 8 MG PO TBDP: ORAL_TABLET | ORAL | Status: DC

## 2015-10-29 MED ORDER — SODIUM CHLORIDE 0.9 % IV BOLUS (SEPSIS)
1000.0000 mL | Freq: Once | INTRAVENOUS | Status: AC
  Administered 2015-10-29: 1000 mL via INTRAVENOUS

## 2015-10-29 MED ORDER — ONDANSETRON HCL 4 MG/2ML IJ SOLN
4.0000 mg | Freq: Once | INTRAMUSCULAR | Status: AC
  Administered 2015-10-29: 4 mg via INTRAVENOUS
  Filled 2015-10-29: qty 2

## 2015-10-29 NOTE — Sleep Study (Signed)
10/29/2015 Patient arrived at sleep center at 8:43 pm. Patient filled out paper work and appeared to be fine. Patient sleep study was started at 11:03 pm and at 12:51 am patient awoke to go to restroom, on patient's way out of restroom patient started to  slumping down at foot of bed. Patient stated she felt dizzy, flush and eventually she fainted. Tech was in room immediately to assist patient from injurying herself from falling. Other staff members were called to aid with patient's care once patient appeared to become unresponsive. Tech's aided patient to get back to bed while EMS was called to transfer patient to the emergency department for further evaluation. Sleep study was terminated at 1:09 am and Patient's husband was called to inform him that she was been transport to the emergency department per patient's request.  V[place]ruill, RPSGT, RRT, RST.

## 2015-10-29 NOTE — ED Notes (Signed)
Pt transported to CT ?

## 2015-10-29 NOTE — ED Provider Notes (Signed)
CSN: [id number]     Arrival date & time 10/29/15  0154 History   First MD Initiated Contact with Patient 10/29/15 0301     Chief Complaint  Patient presents with  . Loss of Consciousness  . Orthostatic Hypotension      (Consider location/radiation/quality/duration/timing/severity/associated sxs/prior Treatment) Patient is a 82 y.o. female presenting with syncope. The history is provided by the patient.  Loss of Consciousness Episode history:  Single Most recent episode:  Today Timing:  Constant Progression:  Resolved Chronicity:  New Context: standing up and urination   Relieved by:  Nothing Worsened by:  Nothing tried Ineffective treatments:  None tried Associated symptoms: nausea and vomiting   Associated symptoms: no chest pain, no fever, no palpitations and no shortness of breath   Risk factors: no congenital heart disease     Past Medical History  Diagnosis Date  . Thyroid disease     hypothyroidism  . Paroxysmal atrial fibrillation (HCC)   . Internal hemorrhoid   . Rectal bleeding   . SVD (spontaneous vaginal delivery) 1957, 1959    x 2  . Peripheral vascular disease ([place])     right arm and right shoulder blood clots r/t a fall  . Hypothyroidism   . Hypertension     controlled with meds  . Dysrhythmia     Hx - a-fib 05/2010 - tx with meds, no problem since 05/2010  . Chronic kidney disease     stage 3 renal disease - no med  . Arthritis     hands, back  . Hearing loss     bilateral hearing aids   Past Surgical History  Procedure Laterality Date  . Tonsillectomy    . Appendectomy    . Cystocele repair    . Joint replacement      left thumb replacement  . Colonoscopy    . Hand surgery      left  . Hemorrhoid injection  04/2011  . Dental surgery      infected tooth - general  . Dilation and curettage of uterus      x several  . Abdominal hysterectomy      and right ovary  . Back surgery      x 3 - 2 rods and 8 screws  . Anterior and posterior  repair N/A 01/03/2014    Procedure: ANTERIOR (CYSTOCELE) REPAIR;  Surgeon: Floyce Stakes. Pamala Hurry, MD;  Location: [facility];  Service: Gynecology;  Laterality: N/A;  . Examination under anesthesia N/A 01/15/2014    Procedure: EXAM UNDER ANESTHESIA with evacuation of hematoma and over sewing of vaginal mucosa.;  Surgeon: Floyce Stakes. Pamala Hurry, MD;  Location: [facility];  Service: Gynecology;  Laterality: N/A;   Family History  Problem Relation Age of Onset  . Heart disease Mother   . Hypertension Father   . Diabetes Father   . Heart disease Sister   . Hypertension Sister   . Heart disease Brother   . Heart attack Mother   . Heart attack Sister   . Heart failure Brother   . Sudden death Maternal Grandfather   . Stroke Father    Social History  Substance Use Topics  . Smoking status: Never Smoker   . Smokeless tobacco: Never Used  . Alcohol Use: No   OB History    Gravida Para Term Preterm AB TAB SAB Ectopic Multiple Living   2 2 2  0 0 0 0 0 0 2     Review of Systems  Constitutional: Negative for fever.  Respiratory: Negative for shortness of breath.   Cardiovascular: Positive for syncope. Negative for chest pain, palpitations and leg swelling.  Gastrointestinal: Positive for nausea and vomiting.  All other systems reviewed and are negative.     Allergies  Pneumovax 23; Codeine; Cephalexin; Sudafed; and Sulfa antibiotics  Home Medications   Prior to Admission medications   Medication Sig Start Date End Date Taking? Authorizing Provider  Calcium Carbonate-Vitamin D (CALCIUM-VITAMIN D) 500-200 MG-UNIT per tablet Take 1 tablet by mouth daily at 12 noon. Reported on 10/29/2015   Yes Historical Provider, MD  diltiazem (CARDIZEM CD) 180 MG 24 hr capsule Take 1 capsule (180 mg total) by mouth daily. 08/06/14  Yes Jerline Pain, MD  fish oil-omega-3 fatty acids 1000 MG capsule Take 1 g by mouth daily.    Yes Historical Provider, MD  flecainide (TAMBOCOR) 100 MG tablet TAKE 1 TABLET BY MOUTH TWICE  A DAY 08/27/15  Yes Jerline Pain, MD  hydrochlorothiazide (HYDRODIURIL) 25 MG tablet Take 25 mg by mouth daily.   Yes Historical Provider, MD  levothyroxine (SYNTHROID, LEVOTHROID) 75 MCG tablet Take 75 mcg by mouth daily.     Yes Historical Provider, MD  Multiple Vitamin (MULTIVITAMIN) tablet Take 0.5 tablets by mouth 2 (two) times daily.    Yes Historical Provider, MD  NON FORMULARY Take by mouth 2 (two) times daily. Black Cohash for hot flashes - Take as directed   Yes Historical Provider, MD  Probiotic Product (PROBIOTIC PO) Take 1 tablet by mouth daily.    Yes Historical Provider, MD  ramipril (ALTACE) 10 MG tablet Take 10 mg by mouth daily.     Yes Historical Provider, MD  vitamin C (ASCORBIC ACID) 500 MG tablet Take 500 mg by mouth 2 (two) times daily.    Yes Historical Provider, MD  warfarin (COUMADIN) 3 MG tablet Take as directed 04/25/14  Yes Historical Provider, MD   BP 148/73 mmHg  Pulse 61  Temp(Src) 97.5 F (36.4 C) (Oral)  Resp 14  SpO2 97% Physical Exam  Constitutional: She is oriented to person, place, and time. She appears well-developed and well-nourished. No distress.  HENT:  Head: Normocephalic and atraumatic.  Mouth/Throat: Oropharynx is clear and moist.  Eyes: Conjunctivae and EOM are normal. Pupils are equal, round, and reactive to light.  Neck: Normal range of motion. Neck supple.  Cardiovascular: Normal rate, regular rhythm and intact distal pulses.   Pulmonary/Chest: Effort normal and breath sounds normal. No respiratory distress. She has no wheezes. She has no rales.  Abdominal: Soft. Bowel sounds are increased. There is no tenderness. There is no rebound and no guarding.  Musculoskeletal: Normal range of motion.  Neurological: She is alert and oriented to person, place, and time. She has normal reflexes.  Skin: Skin is warm and dry.  Psychiatric: She has a normal mood and affect.    ED Course  Procedures (including critical care time) Labs Review Labs  Reviewed  CBC WITH DIFFERENTIAL/PLATELET - Abnormal; Notable for the following:    RBC 5.23 (*)    Hemoglobin 15.1 (*)    All other components within normal limits  PROTIME-INR - Abnormal; Notable for the following:    Prothrombin Time 24.0 (*)    INR 2.17 (*)    All other components within normal limits  I-STAT CHEM 8, ED - Abnormal; Notable for the following:    Glucose, Bld 145 (*)    Hemoglobin 16.3 (*)    HCT  48.0 (*)    All other components within normal limits  I-STAT TROPOININ, ED    Imaging Review No results found. I have personally reviewed and evaluated these images and lab results as part of my medical decision-making.   EKG Interpretation   Date/Time:  Tuesday October 29 2015 01:58:56 EST Ventricular Rate:  61 PR Interval:  261 QRS Duration: 104 QT Interval:  484 QTC Calculation: 488 R Axis:   -44 Text Interpretation:  Sinus rhythm Prolonged PR interval Confirmed by  Sanford [facility]  MD, Amyla Heffner ([id number]) on 10/29/2015 4:33:40 AM      MDM   Final diagnoses:  None   Medications  sodium chloride 0.9 % bolus 1,000 mL (0 mLs Intravenous Stopped 10/29/15 0530)  ondansetron (ZOFRAN) injection 4 mg (4 mg Intravenous Given 10/29/15 0438)    Vasovagal episode while urinating.  Then developed vomiting in the ED.  Suspect viral etiology.  PO challenged successfully in the ED.  Strict return precautions given.  Follow up with your PMD for recheck    Ramone Gander, MD 10/29/15 [phone]

## 2015-10-29 NOTE — Sleep Study (Signed)
   Patient Name: Gola, Ardito MRN: [id number] Study Date: 10/28/2015 Gender: Female D.O.B: 1934/05/18 Age (years): 69 Referring Provider: [name] MD, ABSM Interpreting Physician: [name] MD, ABSM RPSGT: Carolin Coy  Weight (lbs): 175 BMI: 29 Height (inches): 65 Neck Size: 12.50  CLINICAL INFORMATION Sleep Study Type: NPSG Indication for sleep study: Excessive Daytime Sleepiness, Hypertension, Snoring Epworth Sleepiness Score: 3  SLEEP STUDY TECHNIQUE As per the AASM Manual for the Scoring of Sleep and Associated Events v2.3 (April 2016) with a hypopnea requiring 4% desaturations. The channels recorded and monitored were frontal, central and occipital EEG, electrooculogram (EOG), submentalis EMG (chin), nasal and oral airflow, thoracic and abdominal wall motion, anterior tibialis EMG, snore microphone, electrocardiogram, and pulse oximetry.  MEDICATIONS Patient's medications include: Cardizem, Flecainide, HCTZ, Synthroid, Altace, Vit C, Warfarin, Zofran.  Medications self-administered by patient during sleep study : Sleep medicine administered - None  SLEEP ARCHITECTURE The study was initiated at 11:03:25 PM and ended at 1:09:36 AM. Sleep onset time was 30.0 minutes and the sleep efficiency was reduced at 49.1%. The total sleep time was markedly reduced at 62.0 minutes due to fainting episode requiring transport to ER. Stage REM latency was N/A minutes. The patient spent 8.06% of the night in stage N1 sleep, 91.94% in stage N2 sleep, 0.00% in stage N3 and 0.00% in REM. Alpha intrusion was absent. Supine sleep was 0.00%.  RESPIRATORY PARAMETERS The overall apnea/hypopnea index (AHI) was 0.0 per hour. There were 0 total apneas, including 0 obstructive, 0 central and 0 mixed apneas. There were 0 hypopneas and 2 RERAs. The AHI during Stage REM sleep was N/A per hour. AHI while supine was N/A per hour. The mean oxygen saturation was 90.76%. The minimum SpO2  during sleep was 88.00%. Soft snoring was noted during this study.  CARDIAC DATA The 2 lead EKG demonstrated sinus rhythm. The mean heart rate was 68.93 beats per minute. Other EKG findings include: atrial fibrillation  LEG MOVEMENT DATA The total PLMS were 0 with a resulting PLMS index of 0.00. Associated arousal with leg movement index was 0.0 .  IMPRESSIONS - No significant obstructive sleep apnea occurred during this study (AHI = 0.0/h) but insufficient sleep time (62 minutes) due to syncopal event requiring transport to the ER. - No significant central sleep apnea occurred during this study (CAI = 0.0/h). - The patient had minimal or no oxygen desaturation during the study (Min O2 = 88.00%) - The patient snored with Soft snoring volume. - No cardiac abnormalities were noted during this study. - Clinically significant periodic limb movements did not occur during sleep. No significant associated arousals.   - Atrial fibrillation - Syncope - patient was transported to ER.  RECOMMENDATIONS - Repeat split night PSG to evaluate for OSA.   - Avoid alcohol, sedatives and other CNS depressants that may worsen sleep apnea and disrupt normal sleep architecture. - Sleep hygiene should be reviewed to assess factors that may improve sleep quality. - Weight management and regular exercise should be initiated or continued if appropriate.   Sueanne Margarita Diplomate, American Board of Sleep Medicine  ELECTRONICALLY SIGNED ON:  10/29/2015, 8:33 AM [place] PH: [phone]   FX: [phone] [place]

## 2015-10-29 NOTE — ED Notes (Addendum)
Pt BIB EMS for syncopal episode; Pt was at sleep study center and called out to use bathroom; pt began feeling lightheaded while sitting on toilet; pt was not trying to have a bowel movement; pt sat herself down on floor as she lost consciousness; staff report LOC lasted 3-5 minutes; pt denies pain; pt has hx of A Fib and AV First degree block

## 2015-10-29 NOTE — ED Notes (Signed)
Two unsuccessful IV attempts by this nurse

## 2015-10-29 NOTE — ED Notes (Signed)
Pt keeping down fluids successfully.

## 2015-10-29 NOTE — Telephone Encounter (Signed)
Spoke with patient and she stated that she remembers getting up to go to the restroom the night of her study and then the next thing she knew was people were all around her and she was on the floor at the foot of the bed. She stated that her blood pressure was dropping when she would get up. She said that she was transported to the ED and once she got there she threw up.  She said that the ED physician told her that she had the flu and gave her RX for nausea and sent her home.  Patient states that she has not been sick since then and has not felt like she had the flu.  She stated that she was in A-Fib before going to the sleep lab and she had taken a  100mg  flecainide tablet just after 6pm, and another one at 7:20pm at the advice of "Dr Isaac Laud"  She was told if it was not under control within two hours to take a Diltiazem 180mg  tablet, which she took between 9:30 and 9:45 that night.   She is wondering if the medications would be what caused her to pass out.  She said that someone is supposed to call her to discuss this ablation.        As far as her sleep study, we will reschedule the sleep study  And I will mail her a letter with the new date.

## 2015-10-29 NOTE — Telephone Encounter (Signed)
Patient needs to be rescheduled for split night PSG.  SHe had syncope early in the evening and had to be transported to ER.

## 2015-10-29 NOTE — ED Notes (Signed)
Bed: WA17 Expected date:  Expected time:  Means of arrival:  Comments: EMS 81yo F syncopal episode at sleep center

## 2015-10-30 ENCOUNTER — Encounter: Payer: Self-pay | Admitting: Internal Medicine

## 2015-10-30 DIAGNOSIS — Z7901 Long term (current) use of anticoagulants: Secondary | ICD-10-CM | POA: Diagnosis not present

## 2015-10-30 LAB — PROTIME-INR: INR: 2.7 — AB (ref 0.9–1.1)

## 2015-11-01 ENCOUNTER — Other Ambulatory Visit: Payer: Self-pay | Admitting: *Deleted

## 2015-11-01 DIAGNOSIS — I480 Paroxysmal atrial fibrillation: Secondary | ICD-10-CM

## 2015-11-01 NOTE — Addendum Note (Signed)
Addended by: Janan Halter F on: 11/01/2015 04:22 PM   Modules accepted: Orders

## 2015-11-04 ENCOUNTER — Encounter: Payer: Self-pay | Admitting: Internal Medicine

## 2015-11-05 DIAGNOSIS — Z7901 Long term (current) use of anticoagulants: Secondary | ICD-10-CM | POA: Diagnosis not present

## 2015-11-06 ENCOUNTER — Encounter: Payer: Self-pay | Admitting: Internal Medicine

## 2015-11-06 ENCOUNTER — Ambulatory Visit (HOSPITAL_COMMUNITY): Payer: Medicare Other | Admitting: Nurse Practitioner

## 2015-11-06 LAB — PROTIME-INR: INR: 2.2 — AB (ref 0.9–1.1)

## 2015-11-12 ENCOUNTER — Encounter: Payer: Self-pay | Admitting: Internal Medicine

## 2015-11-12 ENCOUNTER — Encounter: Payer: Self-pay | Admitting: *Deleted

## 2015-11-12 DIAGNOSIS — Z7901 Long term (current) use of anticoagulants: Secondary | ICD-10-CM | POA: Diagnosis not present

## 2015-11-12 LAB — PROTIME-INR: INR: 2.2 — AB (ref 0.9–1.1)

## 2015-11-14 ENCOUNTER — Other Ambulatory Visit (INDEPENDENT_AMBULATORY_CARE_PROVIDER_SITE_OTHER): Payer: Medicare Other | Admitting: *Deleted

## 2015-11-14 DIAGNOSIS — I480 Paroxysmal atrial fibrillation: Secondary | ICD-10-CM

## 2015-11-14 DIAGNOSIS — I4891 Unspecified atrial fibrillation: Secondary | ICD-10-CM | POA: Diagnosis not present

## 2015-11-14 LAB — CBC WITH DIFFERENTIAL/PLATELET
Basophils Absolute: 0.1 K/uL (ref 0.0–0.1)
Basophils Relative: 1 % (ref 0–1)
Eosinophils Absolute: 0.2 K/uL (ref 0.0–0.7)
Eosinophils Relative: 4 % (ref 0–5)
HCT: 42.4 % (ref 36.0–46.0)
Hemoglobin: 14.1 g/dL (ref 12.0–15.0)
Lymphocytes Relative: 34 % (ref 12–46)
Lymphs Abs: 2 K/uL (ref 0.7–4.0)
MCH: 28.3 pg (ref 26.0–34.0)
MCHC: 33.3 g/dL (ref 30.0–36.0)
MCV: 85 fL (ref 78.0–100.0)
MPV: 9.6 fL (ref 8.6–12.4)
Monocytes Absolute: 0.5 K/uL (ref 0.1–1.0)
Monocytes Relative: 8 % (ref 3–12)
Neutro Abs: 3.2 K/uL (ref 1.7–7.7)
Neutrophils Relative %: 53 % (ref 43–77)
Platelets: 221 K/uL (ref 150–400)
RBC: 4.99 MIL/uL (ref 3.87–5.11)
RDW: 14.5 % (ref 11.5–15.5)
WBC: 6 K/uL (ref 4.0–10.5)

## 2015-11-14 LAB — BASIC METABOLIC PANEL WITH GFR
BUN: 16 mg/dL (ref 7–25)
CO2: 27 mmol/L (ref 20–31)
Calcium: 9.7 mg/dL (ref 8.6–10.4)
Chloride: 102 mmol/L (ref 98–110)
Creat: 0.93 mg/dL — ABNORMAL HIGH (ref 0.60–0.88)
Glucose, Bld: 92 mg/dL (ref 65–99)
Potassium: 4.1 mmol/L (ref 3.5–5.3)
Sodium: 140 mmol/L (ref 135–146)

## 2015-11-14 LAB — PROTIME-INR
INR: 2.13 — ABNORMAL HIGH
Prothrombin Time: 24.1 s — ABNORMAL HIGH (ref 11.6–15.2)

## 2015-11-19 ENCOUNTER — Telehealth: Payer: Self-pay | Admitting: Internal Medicine

## 2015-11-19 DIAGNOSIS — Z7901 Long term (current) use of anticoagulants: Secondary | ICD-10-CM | POA: Diagnosis not present

## 2015-11-19 NOTE — Telephone Encounter (Signed)
New message      Talk to the nurse regarding the instructions to follow prior to her ablation she received.  She has questions

## 2015-11-19 NOTE — Telephone Encounter (Signed)
Returned call to patient.  She was questioning if she can take her Synthroid the morning of the procedure.  I told her it would be [employer]

## 2015-11-22 ENCOUNTER — Ambulatory Visit (HOSPITAL_COMMUNITY)
Admission: RE | Admit: 2015-11-22 | Discharge: 2015-11-22 | Disposition: A | Discharge status: Home / Self Care | Payer: Medicare Other | Source: Ambulatory Visit | Attending: Internal Medicine | Admitting: Internal Medicine

## 2015-11-22 ENCOUNTER — Encounter (HOSPITAL_COMMUNITY): Payer: Self-pay

## 2015-11-22 DIAGNOSIS — R911 Solitary pulmonary nodule: Secondary | ICD-10-CM | POA: Diagnosis not present

## 2015-11-22 DIAGNOSIS — K449 Diaphragmatic hernia without obstruction or gangrene: Secondary | ICD-10-CM | POA: Insufficient documentation

## 2015-11-22 DIAGNOSIS — I480 Paroxysmal atrial fibrillation: Secondary | ICD-10-CM | POA: Insufficient documentation

## 2015-11-22 DIAGNOSIS — I708 Atherosclerosis of other arteries: Secondary | ICD-10-CM | POA: Diagnosis not present

## 2015-11-22 DIAGNOSIS — D3502 Benign neoplasm of left adrenal gland: Secondary | ICD-10-CM | POA: Insufficient documentation

## 2015-11-22 MED ORDER — IOHEXOL 350 MG/ML SOLN
80.0000 mL | Freq: Once | INTRAVENOUS | Status: AC | PRN
  Administered 2015-11-22: 80 mL via INTRAVENOUS

## 2015-11-22 MED ORDER — NITROGLYCERIN 0.4 MG SL SUBL
0.4000 mg | SUBLINGUAL_TABLET | SUBLINGUAL | Status: DC | PRN
  Administered 2015-11-22: 0.4 mg via SUBLINGUAL
  Filled 2015-11-22 (×2): qty 25

## 2015-11-22 MED ORDER — NITROGLYCERIN 0.4 MG SL SUBL
SUBLINGUAL_TABLET | SUBLINGUAL | Status: AC
  Filled 2015-11-22: qty 1

## 2015-11-28 ENCOUNTER — Ambulatory Visit (HOSPITAL_COMMUNITY): Payer: Medicare Other | Admitting: Certified Registered Nurse Anesthetist

## 2015-11-28 ENCOUNTER — Encounter (HOSPITAL_COMMUNITY): Payer: Self-pay | Admitting: Certified Registered Nurse Anesthetist

## 2015-11-28 ENCOUNTER — Ambulatory Visit (HOSPITAL_COMMUNITY)
Admission: RE | Admit: 2015-11-28 | Discharge: 2015-11-29 | Disposition: A | Discharge status: Home / Self Care | Payer: Medicare Other | Source: Ambulatory Visit | Attending: Internal Medicine | Admitting: Internal Medicine

## 2015-11-28 ENCOUNTER — Encounter (HOSPITAL_COMMUNITY)
Admission: RE | Disposition: A | Discharge status: Home / Self Care | Payer: Self-pay | Source: Ambulatory Visit | Attending: Internal Medicine

## 2015-11-28 DIAGNOSIS — I4891 Unspecified atrial fibrillation: Secondary | ICD-10-CM | POA: Diagnosis not present

## 2015-11-28 DIAGNOSIS — Z79899 Other long term (current) drug therapy: Secondary | ICD-10-CM | POA: Insufficient documentation

## 2015-11-28 DIAGNOSIS — Z7901 Long term (current) use of anticoagulants: Secondary | ICD-10-CM | POA: Diagnosis not present

## 2015-11-28 DIAGNOSIS — E039 Hypothyroidism, unspecified: Secondary | ICD-10-CM | POA: Diagnosis not present

## 2015-11-28 DIAGNOSIS — I129 Hypertensive chronic kidney disease with stage 1 through stage 4 chronic kidney disease, or unspecified chronic kidney disease: Secondary | ICD-10-CM | POA: Insufficient documentation

## 2015-11-28 DIAGNOSIS — N189 Chronic kidney disease, unspecified: Secondary | ICD-10-CM | POA: Insufficient documentation

## 2015-11-28 DIAGNOSIS — I480 Paroxysmal atrial fibrillation: Secondary | ICD-10-CM | POA: Insufficient documentation

## 2015-11-28 DIAGNOSIS — I739 Peripheral vascular disease, unspecified: Secondary | ICD-10-CM | POA: Diagnosis not present

## 2015-11-28 HISTORY — DX: Paroxysmal atrial fibrillation: I48.0

## 2015-11-28 HISTORY — PX: ELECTROPHYSIOLOGIC STUDY: SHX172A

## 2015-11-28 LAB — POCT ACTIVATED CLOTTING TIME
ACTIVATED CLOTTING TIME: 327 s
Activated Clotting Time: 348 seconds
Activated Clotting Time: 363 seconds
Activated Clotting Time: 178 seconds

## 2015-11-28 LAB — PROTIME-INR
INR: 2.15 — AB (ref 0.00–1.49)
PROTHROMBIN TIME: 23.8 s — AB (ref 11.6–15.2)

## 2015-11-28 SURGERY — ATRIAL FIBRILLATION ABLATION
Anesthesia: General

## 2015-11-28 MED ORDER — ONDANSETRON HCL 4 MG/2ML IJ SOLN: 4.0000 mg | Freq: Once | INTRAMUSCULAR | Status: DC | PRN

## 2015-11-28 MED ORDER — SODIUM CHLORIDE 0.9 % IV SOLN: 250.0000 mL | INTRAVENOUS | Status: DC | PRN

## 2015-11-28 MED ORDER — PROTAMINE SULFATE 10 MG/ML IV SOLN
INTRAVENOUS | Status: DC | PRN
Start: 2015-11-28 — End: 2015-11-28
  Administered 2015-11-28: 30 mg via INTRAVENOUS

## 2015-11-28 MED ORDER — EPHEDRINE SULFATE 50 MG/ML IJ SOLN
INTRAMUSCULAR | Status: DC | PRN
  Administered 2015-11-28: 5 mg via INTRAVENOUS

## 2015-11-28 MED ORDER — BUPIVACAINE HCL (PF) 0.25 % IJ SOLN
INTRAMUSCULAR | Status: DC | PRN
  Administered 2015-11-28: 30 mL

## 2015-11-28 MED ORDER — BUPIVACAINE HCL (PF) 0.25 % IJ SOLN
INTRAMUSCULAR | Status: AC
  Filled 2015-11-28: qty 30

## 2015-11-28 MED ORDER — HYDROCODONE-ACETAMINOPHEN 5-325 MG PO TABS: 1.0000 | ORAL_TABLET | ORAL | Status: DC | PRN

## 2015-11-28 MED ORDER — FENTANYL CITRATE (PF) 100 MCG/2ML IJ SOLN
INTRAMUSCULAR | Status: DC | PRN
  Administered 2015-11-28: 100 ug via INTRAVENOUS
  Administered 2015-11-28: 50 ug via INTRAVENOUS
  Administered 2015-11-28: 25 ug via INTRAVENOUS

## 2015-11-28 MED ORDER — ONDANSETRON HCL 4 MG/2ML IJ SOLN
4.0000 mg | Freq: Four times a day (QID) | INTRAMUSCULAR | Status: DC | PRN
  Administered 2015-11-28 (×3): 4 mg via INTRAVENOUS
  Filled 2015-11-28: qty 2

## 2015-11-28 MED ORDER — WARFARIN SODIUM 2 MG PO TABS
2.0000 mg | ORAL_TABLET | Freq: Once | ORAL | Status: AC
  Administered 2015-11-28: 22:00:00 2 mg via ORAL
  Filled 2015-11-28: qty 1

## 2015-11-28 MED ORDER — FENTANYL CITRATE (PF) 100 MCG/2ML IJ SOLN: 25.0000 ug | INTRAMUSCULAR | Status: DC | PRN

## 2015-11-28 MED ORDER — IOPAMIDOL (ISOVUE-370) INJECTION 76%
INTRAVENOUS | Status: AC
  Filled 2015-11-28: qty 50

## 2015-11-28 MED ORDER — DEXAMETHASONE SODIUM PHOSPHATE 4 MG/ML IJ SOLN
INTRAMUSCULAR | Status: DC | PRN
  Administered 2015-11-28: 4 mg via INTRAVENOUS

## 2015-11-28 MED ORDER — HEPARIN SODIUM (PORCINE) 1000 UNIT/ML IJ SOLN
INTRAMUSCULAR | Status: DC | PRN
  Administered 2015-11-28: 1000 [IU] via INTRAVENOUS

## 2015-11-28 MED ORDER — IOPAMIDOL (ISOVUE-370) INJECTION 76%
INTRAVENOUS | Status: DC | PRN
  Administered 2015-11-28: 5 mL via INTRAVENOUS

## 2015-11-28 MED ORDER — LEVOTHYROXINE SODIUM 75 MCG PO TABS
75.0000 ug | ORAL_TABLET | Freq: Every day | ORAL | Status: DC
  Administered 2015-11-29: 08:00:00 75 ug via ORAL
  Filled 2015-11-28: qty 1

## 2015-11-28 MED ORDER — SODIUM CHLORIDE 0.9 % IV SOLN
INTRAVENOUS | Status: DC
  Administered 2015-11-28 (×3): via INTRAVENOUS

## 2015-11-28 MED ORDER — ONDANSETRON HCL 4 MG/2ML IJ SOLN
INTRAMUSCULAR | Status: DC | PRN
  Administered 2015-11-28: 4 mg via INTRAVENOUS

## 2015-11-28 MED ORDER — SODIUM CHLORIDE 0.9% FLUSH: 3.0000 mL | INTRAVENOUS | Status: DC | PRN

## 2015-11-28 MED ORDER — SODIUM CHLORIDE 0.9% FLUSH
3.0000 mL | Freq: Two times a day (BID) | INTRAVENOUS | Status: DC
Start: 2015-11-28 — End: 2015-11-29
  Administered 2015-11-28: 3 mL via INTRAVENOUS

## 2015-11-28 MED ORDER — PROPOFOL 10 MG/ML IV BOLUS
INTRAVENOUS | Status: DC | PRN
Start: 2015-11-28 — End: 2015-11-28
  Administered 2015-11-28: 170 mg via INTRAVENOUS

## 2015-11-28 MED ORDER — HEPARIN SODIUM (PORCINE) 1000 UNIT/ML IJ SOLN
INTRAMUSCULAR | Status: AC
  Filled 2015-11-28: qty 1

## 2015-11-28 MED ORDER — HYDRALAZINE HCL 20 MG/ML IJ SOLN
INTRAMUSCULAR | Status: AC
  Filled 2015-11-28: qty 1

## 2015-11-28 MED ORDER — ACETAMINOPHEN 325 MG PO TABS: 650.0000 mg | ORAL_TABLET | ORAL | Status: DC | PRN

## 2015-11-28 MED ORDER — RAMIPRIL 10 MG PO TABS: 10.0000 mg | ORAL_TABLET | Freq: Every day | ORAL | Status: DC

## 2015-11-28 MED ORDER — PHENYLEPHRINE HCL 10 MG/ML IJ SOLN
INTRAMUSCULAR | Status: DC | PRN
  Administered 2015-11-28: 40 ug via INTRAVENOUS

## 2015-11-28 MED ORDER — LIDOCAINE HCL (CARDIAC) 20 MG/ML IV SOLN
INTRAVENOUS | Status: DC | PRN
  Administered 2015-11-28: 30 mg via INTRAVENOUS

## 2015-11-28 MED ORDER — WARFARIN - PHYSICIAN DOSING INPATIENT: Freq: Every day | Status: DC

## 2015-11-28 MED ORDER — RAMIPRIL 10 MG PO CAPS
10.0000 mg | ORAL_CAPSULE | Freq: Every day | ORAL | Status: DC
  Administered 2015-11-29: 10 mg via ORAL
  Filled 2015-11-28 (×2): qty 1

## 2015-11-28 MED ORDER — HEPARIN SODIUM (PORCINE) 1000 UNIT/ML IJ SOLN
INTRAMUSCULAR | Status: DC | PRN
  Administered 2015-11-28: 1000 [IU] via INTRAVENOUS
  Administered 2015-11-28: 12000 [IU] via INTRAVENOUS
  Administered 2015-11-28: 1000 [IU] via INTRAVENOUS

## 2015-11-28 MED ORDER — ONDANSETRON HCL 4 MG/2ML IJ SOLN
INTRAMUSCULAR | Status: AC
  Filled 2015-11-28: qty 2

## 2015-11-28 SURGICAL SUPPLY — 19 items
BAG SNAP BAND KOVER 36X36 (MISCELLANEOUS) ×2 IMPLANT
BLANKET WARM UNDERBOD FULL ACC (MISCELLANEOUS) ×2 IMPLANT
CATH NAVISTAR SMARTTOUCH DF (ABLATOR) ×1 IMPLANT
CATH SOUNDSTAR 3D IMAGING (CATHETERS) ×1 IMPLANT
CATH VARIABLE LASSO NAV 2515 (CATHETERS) ×1 IMPLANT
CATH WEBSTER BI DIR CS D-F CRV (CATHETERS) ×1 IMPLANT
COVER SWIFTLINK CONNECTOR (BAG) ×2 IMPLANT
NDL TRANSEP BRK 71CM 407200 (NEEDLE) IMPLANT
NEEDLE TRANSEP BRK 71CM 407200 (NEEDLE) ×2 IMPLANT
PACK EP LATEX FREE (CUSTOM PROCEDURE TRAY) ×2
PACK EP LF (CUSTOM PROCEDURE TRAY) ×1 IMPLANT
PAD DEFIB LIFELINK (PAD) ×2 IMPLANT
PATCH CARTO3 (PAD) ×1 IMPLANT
SHEATH AVANTI 11F 11CM (SHEATH) ×1 IMPLANT
SHEATH PINNACLE 7F 10CM (SHEATH) ×2 IMPLANT
SHEATH PINNACLE 9F 10CM (SHEATH) ×1 IMPLANT
SHEATH SWARTZ TS SL2 63CM 8.5F (SHEATH) ×1 IMPLANT
SHIELD RADPAD SCOOP 12X17 (MISCELLANEOUS) ×2 IMPLANT
TUBING SMART ABLATE COOLFLOW (TUBING) ×1 IMPLANT

## 2015-11-28 NOTE — H&P (Signed)
PCP: Mathews Argyle, MD Cardiologist: Dr Marlou Porch Primary Electrophysiologist: [name], MD   Chief Complaint  Patient presents with  . Atrial Fibrillation    History of Present Illness: Frances [name] is a 82 y.o. female who presents today for afib ablation. The patient reports initially being diagnosed with atrial fibrillation in 2011. In retrospect, she feels that she has had afib since at least 2005. She has difficulty articulating her afib symptoms but seems to have primarily dizziness, diaphoresis and fatigue. Episodes have increased in frequency and duration despite medical therapy with flecainide. She presents for further EP consultation.  Today, she denies symptoms of chest pain, shortness of breath, orthopnea, PND, lower extremity edema, claudication, presyncope, syncope, bleeding, or neurologic sequela. The patient is tolerating medications without difficulties and is otherwise without complaint today.    Past Medical History  Diagnosis Date  . Thyroid disease     hypothyroidism  . Paroxysmal atrial fibrillation (HCC)   . Internal hemorrhoid   . Rectal bleeding   . SVD (spontaneous vaginal delivery) 1957, 1959    x 2  . Peripheral vascular disease ([place])     right arm and right shoulder blood clots r/t a fall  . Hypothyroidism   . Hypertension     controlled with meds  . Dysrhythmia     Hx - a-fib 05/2010 - tx with meds, no problem since 05/2010  . Chronic kidney disease     stage 3 renal disease - no med  . Arthritis     hands, back  . Hearing loss     bilateral hearing aids   Past Surgical History  Procedure Laterality Date  . Tonsillectomy    . Appendectomy    . Cystocele repair    . Joint replacement      left thumb replacement  . Colonoscopy    . Hand surgery      left  . Hemorrhoid injection  04/2011  . Dental  surgery      infected tooth - general  . Dilation and curettage of uterus      x several  . Abdominal hysterectomy      and right ovary  . Back surgery      x 3 - 2 rods and 8 screws  . Anterior and posterior repair N/A 01/03/2014    Procedure: ANTERIOR (CYSTOCELE) REPAIR; Surgeon: Floyce Stakes. Pamala Hurry, MD; Location: [facility]; Service: Gynecology; Laterality: N/A;  . Examination under anesthesia N/A 01/15/2014    Procedure: EXAM UNDER ANESTHESIA with evacuation of hematoma and over sewing of vaginal mucosa.; Surgeon: Floyce Stakes. Pamala Hurry, MD; Location: [facility]; Service: Gynecology; Laterality: N/A;     Current Outpatient Prescriptions  Medication Sig Dispense Refill  . Calcium Carbonate-Vitamin D (CALCIUM-VITAMIN D) 500-200 MG-UNIT per tablet Take 1 tablet by mouth daily at 12 noon.     . diltiazem (CARDIZEM CD) 180 MG 24 hr capsule Take 1 capsule (180 mg total) by mouth daily.    . fish oil-omega-3 fatty acids 1000 MG capsule Take 1 g by mouth daily.     . flecainide (TAMBOCOR) 100 MG tablet TAKE 1 TABLET BY MOUTH TWICE A DAY 180 tablet 2  . hydrochlorothiazide (HYDRODIURIL) 25 MG tablet Take 25 mg by mouth daily.    Marland Kitchen levothyroxine (SYNTHROID, LEVOTHROID) 75 MCG tablet Take 75 mcg by mouth daily.     . Multiple Vitamin (MULTIVITAMIN) tablet Take 0.5 tablets by mouth 2 (two) times daily.     . NON FORMULARY  Take by mouth 2 (two) times daily. Black Cohash for hot flashes - Take as directed    . Probiotic Product (PROBIOTIC PO) Take 1 tablet by mouth daily.     . ramipril (ALTACE) 10 MG tablet Take 10 mg by mouth daily.     . vitamin C (ASCORBIC ACID) 500 MG tablet Take 500 mg by mouth 2 (two) times daily.     Marland Kitchen warfarin (COUMADIN) 3 MG tablet Taking as directed by the coumadin clinic     No current facility-administered medications for this visit.    Allergies: Pneumovax  23; Codeine; Cephalexin; Sudafed; and Sulfa antibiotics   Social History: The patient  reports that she has never smoked. She has never used smokeless tobacco. She reports that she does not drink alcohol or use illicit drugs.   Family History: The patient's family history includes Diabetes in her father; Heart attack in her mother and sister; Heart disease in her brother, mother, and sister; Heart failure in her brother; Hypertension in her father and sister; Stroke in her father; Sudden death in her maternal grandfather.    ROS: Please see the history of present illness. All other systems are reviewed and negative.    PHYSICAL EXAM: Filed Vitals:   11/28/15 0902  BP: 160/89  Pulse: 76  Temp: 97.6 F (36.4 C)  Resp: 16   GEN: Well nourished, well developed, in no acute distress  HEENT: normal  Neck: no JVD, carotid bruits, or masses Cardiac: RRR; no murmurs, rubs, or gallops,no edema  Respiratory: clear to auscultation bilaterally, normal work of breathing GI: soft, nontender, nondistended, + BS MS: no deformity or atrophy  Skin: warm and dry  Neuro: Strength and sensation are intact Psych: euthymic mood, full affect   Lipid Panel   Labs (Brief)    No results found for: CHOL, TRIG, HDL, CHOLHDL, VLDL, LDLCALC, LDLDIRECT     Wt Readings from Last 3 Encounters:  10/07/15 179 lb 9.6 oz (81.466 kg)  08/14/15 179 lb 9.6 oz (81.466 kg)  08/06/15 177 lb (80.287 kg)    ASSESSMENT AND PLAN:  1. Paroxysmal atrial fibrillation The patient has symptomatic atrial fibrillation. She has failed medical therapy with flecainide and diltiazem. T Therapeutic strategies for afib including medicine and ablation were discussed in detail with the patient today. Risk, benefits, and alternatives to EP study and radiofrequency ablation for afib were also discussed in detail today. These risks include but are not limited to stroke, bleeding, vascular damage, tamponade,  perforation, damage to the esophagus, lungs, and other structures, pulmonary vein stenosis, worsening renal function, and death. The patient understands these risk and wishes to proceed.  Cardiac CT is reviewed and reveals no appendage clot.  [name] MD, [facility] 11/28/2015 9:36 AM

## 2015-11-28 NOTE — Transfer of Care (Signed)
Immediate Anesthesia Transfer of Care Note  Patient: Frances [name]  Procedure(s) Performed: Procedure(s): Atrial Fibrillation Ablation (N/A)  Patient Location: Cath Lab  Anesthesia Type:General  Level of Consciousness: awake and alert   Airway & Oxygen Therapy: Patient Spontanous Breathing and Patient connected to nasal cannula oxygen  Post-op Assessment: Report given to RN and Post -op Vital signs reviewed and stable  Post vital signs: Reviewed and stable  Last Vitals:  Filed Vitals:   11/28/15 0902 11/28/15 1610  BP: 160/89 139/69  Pulse: 76 89  Temp: 36.4 C   Resp: 16 17    Complications: No apparent anesthesia complications

## 2015-11-28 NOTE — Discharge Summary (Signed)
ELECTROPHYSIOLOGY PROCEDURE DISCHARGE SUMMARY    Patient ID: Frances [name],  MRN: [id number], DOB/AGE: [id number] 80 y.o.  Admit date: 11/28/2015 Discharge date: 11/29/15  Primary Care Physician: Mathews Argyle, MD  Primary Cardiologist: Dr. Marlou Porch Electrophysiologist: [name], MD  Primary Discharge Diagnosis:  1. Paroxysmal Afib     CHA2DS2vasc is at least 4, on coumadin  Secondary Discharge Diagnosis:  1. HTN 2. Hypothyroidism 3. CRI  Procedures This Admission:  1.  Electrophysiology study and radiofrequency catheter ablation on 11/28/15 by Dr [name].  This study demonstrated  CONCLUSIONS: 1. Sinus rhythm upon presentation.  2. Intracardiac echo reveals a moderate sized left atrium with four separate pulmonary veins without evidence of pulmonary vein stenosis. 3. Successful electrical isolation and anatomical encircling of all four pulmonary veins with radiofrequency current. 4. No inducible arrhythmias following ablation  5. No early apparent complications.  Brief HPI: Frances [name] is a 82 y.o. female with a history of paroxysmal atrial fibrillation.  They have failed medical therapy with Flecainide. Risks, benefits, and alternatives to catheter ablation of atrial fibrillation were reviewed with the patient who wished to proceed.  The patient underwent cardiac CTprior to the procedure which demonstrated no LAA thrombus.    Hospital Course:  The patient was admitted and underwent EPS/RFCA of atrial fibrillation with details as outlined above.  They were monitored on telemetry overnight which demonstrated SR.  Groin was without complication on the day of discharge.  The patient was examined by Dr. Rayann Heman and considered to be stable for discharge.  Wound care and restrictions were reviewed with the patient.  The patient will be seen back by Roderic Palau, NP in 4 weeks and Dr Rayann Heman in 12 weeks for post ablation follow up. She is given Protonix 40mg   daily for 45 days.  Her coumadin is monitored and managed with her PMD, no changes are being made to her coumadin regime, her next INR is scheduled for 3 weeks from now, she will keep her already scheduled visit with her PMD for this.   Physical Exam: Filed Vitals:   11/28/15 2200 11/29/15 0000 11/29/15 0522 11/29/15 0832  BP: 174/78 129/63 168/82 152/66  Pulse: 79 79 84 90  Temp:   98.9 F (37.2 C) 99 F (37.2 C)  TempSrc:   Oral Oral  Resp: 18 16 21 18   Height:      Weight:      SpO2: 97% 96% 97% 95%     GEN- The patient is well appearing, alert and oriented x 3 today.   HEENT: normocephalic, atraumatic; sclera clear, conjunctiva pink; hearing intact; oropharynx clear; neck supple  Lungs- Clear to ausculation bilaterally, normal work of breathing.  No wheezes, rales, rhonchi Heart- Regular rate and rhythm, no murmurs, rubs or gallops  GI- soft, non-tender, non-distended, bowel sounds present  Extremities- no clubbing, cyanosis, or edema; DP/PT/radial pulses 2+ bilaterally, groin without hematoma/bruit MS- no significant deformity or atrophy Skin- warm and dry, no rash or lesion Psych- euthymic mood, full affect Neuro- strength and sensation are intact   Labs:   Lab Results  Component Value Date   WBC 6.0 11/14/2015   HGB 14.1 11/14/2015   HCT 42.4 11/14/2015   MCV 85.0 11/14/2015   PLT 221 11/14/2015   No results for input(s): NA, K, CL, CO2, BUN, CREATININE, CALCIUM, PROT, BILITOT, ALKPHOS, ALT, AST, GLUCOSE in the last 168 hours.  Invalid input(s): LABALBU   Discharge Medications:    Medication List  STOP taking these medications        ondansetron 8 MG disintegrating tablet  Commonly known as:  ZOFRAN ODT      TAKE these medications        calcium-vitamin D 500-200 MG-UNIT tablet  Take 1 tablet by mouth daily at 12 noon. Reported on 10/29/2015     diltiazem 180 MG 24 hr capsule  Commonly known as:  CARDIZEM CD  Take 1 capsule (180 mg total) by  mouth daily.     fish oil-omega-3 fatty acids 1000 MG capsule  Take 1 g by mouth daily.     flecainide 100 MG tablet  Commonly known as:  TAMBOCOR  TAKE 1 TABLET BY MOUTH TWICE A DAY     hydrochlorothiazide 25 MG tablet  Commonly known as:  HYDRODIURIL  Take 25 mg by mouth daily.     levothyroxine 75 MCG tablet  Commonly known as:  SYNTHROID, LEVOTHROID  Take 75 mcg by mouth daily.     multivitamin tablet  Take 0.5 tablets by mouth 2 (two) times daily.     NON FORMULARY  Take by mouth 2 (two) times daily. Black Cohash for hot flashes - Take as directed     pantoprazole 40 MG tablet  Commonly known as:  PROTONIX  Take 1 tablet (40 mg total) by mouth daily.     PROBIOTIC PO  Take 1 tablet by mouth daily.     ramipril 10 MG tablet  Commonly known as:  ALTACE  Take 10 mg by mouth daily.     vitamin C 500 MG tablet  Commonly known as:  ASCORBIC ACID  Take 500 mg by mouth daily.     warfarin 2 MG tablet  Commonly known as:  COUMADIN  Take 2 mg by mouth daily.     warfarin 3 MG tablet  Commonly known as:  COUMADIN  Take as directed        Disposition:  Home Discharge Instructions    Diet - low sodium heart healthy    Complete by:  As directed      Increase activity slowly    Complete by:  As directed           Follow-up Information    Follow up with [name], MD On 03/04/2016.   Specialty:  Cardiology   Why:  11:45AM   Contact information:   [place] [address] [phone]       Follow up with [facility] On 12/30/2015.   Specialty:  Cardiology   Why:  1:30PM   Contact information:   [id number] [place] [phone]      Duration of Discharge Encounter: Greater than 30 minutes including physician time.  SignedTommye Standard, PA-C 11/29/2015 8:57 AM    I have seen, examined the patient, and reviewed the above assessment and plan.  On  exam, RRR.  Changes to above are made where necessary.    Co Sign: [name], MD 11/29/15

## 2015-11-28 NOTE — Anesthesia Preprocedure Evaluation (Signed)
Anesthesia Evaluation  Patient identified by MRN, date of birth, ID band Patient awake    Reviewed: Allergy & Precautions, NPO status , Patient's Chart, lab work & pertinent test results  Airway Mallampati: II  TM Distance: >3 FB Neck ROM: Full    Dental  (+) Dental Advisory Given, Teeth Intact   Pulmonary    breath sounds clear to auscultation       Cardiovascular hypertension,  Rhythm:Regular Rate:Normal     Neuro/Psych    GI/Hepatic   Endo/Other    Renal/GU      Musculoskeletal   Abdominal   Peds  Hematology   Anesthesia Other Findings   Reproductive/Obstetrics                             Anesthesia Physical Anesthesia Plan  ASA: III  Anesthesia Plan: General   Post-op Pain Management:    Induction: Intravenous  Airway Management Planned: LMA  Additional Equipment:   Intra-op Plan:   Post-operative Plan:   Informed Consent: I have reviewed the patients History and Physical, chart, labs and discussed the procedure including the risks, benefits and alternatives for the proposed anesthesia with the patient or authorized representative who has indicated his/her understanding and acceptance.   Dental advisory given  Plan Discussed with: CRNA and Anesthesiologist  Anesthesia Plan Comments:         Anesthesia Quick Evaluation

## 2015-11-28 NOTE — Progress Notes (Signed)
68F, 51F, and 53F catheters removed from R femoral vein by Tessie Eke. Manual pressure held for 20 minutes. Site is level 0. No bleeding or hematoma noted. Site dressed with 4x4 and tegaderm. Pt tolerated very well.

## 2015-11-28 NOTE — Anesthesia Postprocedure Evaluation (Signed)
Anesthesia Post Note  Patient: Frances [name]  Procedure(s) Performed: Procedure(s) (LRB): Atrial Fibrillation Ablation (N/A)  Patient location during evaluation: Cath Lab Anesthesia Type: General Level of consciousness: awake, awake and alert and oriented Pain management: pain level controlled Vital Signs Assessment: post-procedure vital signs reviewed and stable Respiratory status: spontaneous breathing and nonlabored ventilation Cardiovascular status: blood pressure returned to baseline Postop Assessment: no headache Anesthetic complications: no    Last Vitals:  Filed Vitals:   11/28/15 1700 11/28/15 1705  BP: 164/77 167/75  Pulse: 82 82  Temp:    Resp: 18 14    Last Pain: There were no vitals filed for this visit.               Emelda Kohlbeck COKER

## 2015-11-28 NOTE — Anesthesia Procedure Notes (Signed)
Procedure Name: LMA Insertion Date/Time: 11/28/2015 12:10 PM Performed by: Raphael Gibney T Pre-anesthesia Checklist: Patient identified, Timeout performed, Emergency Drugs available, Suction available and Patient being monitored Patient Re-evaluated:Patient Re-evaluated prior to inductionOxygen Delivery Method: Circle system utilized and Simple face mask Preoxygenation: Pre-oxygenation with 100% oxygen Intubation Type: IV induction Ventilation: Mask ventilation without difficulty LMA: LMA inserted LMA Size: 4.0 Number of attempts: 1 Airway Equipment and Method: Patient positioned with wedge pillow Placement Confirmation: positive ETCO2 and breath sounds checked- equal and bilateral Tube secured with: Tape Dental Injury: Teeth and Oropharynx as per pre-operative assessment

## 2015-11-29 ENCOUNTER — Encounter (HOSPITAL_COMMUNITY): Payer: Self-pay | Admitting: Internal Medicine

## 2015-11-29 DIAGNOSIS — I129 Hypertensive chronic kidney disease with stage 1 through stage 4 chronic kidney disease, or unspecified chronic kidney disease: Secondary | ICD-10-CM | POA: Diagnosis not present

## 2015-11-29 DIAGNOSIS — I480 Paroxysmal atrial fibrillation: Secondary | ICD-10-CM

## 2015-11-29 DIAGNOSIS — E039 Hypothyroidism, unspecified: Secondary | ICD-10-CM | POA: Diagnosis not present

## 2015-11-29 DIAGNOSIS — N189 Chronic kidney disease, unspecified: Secondary | ICD-10-CM | POA: Diagnosis not present

## 2015-11-29 DIAGNOSIS — Z79899 Other long term (current) drug therapy: Secondary | ICD-10-CM | POA: Diagnosis not present

## 2015-11-29 DIAGNOSIS — Z7901 Long term (current) use of anticoagulants: Secondary | ICD-10-CM | POA: Diagnosis not present

## 2015-11-29 LAB — PROTIME-INR
INR: 2.28 — AB (ref 0.00–1.49)
Prothrombin Time: 24.9 seconds — ABNORMAL HIGH (ref 11.6–15.2)

## 2015-11-29 MED ORDER — PANTOPRAZOLE SODIUM 40 MG PO TBEC: 40.0000 mg | DELAYED_RELEASE_TABLET | Freq: Every day | ORAL | Status: DC

## 2015-11-29 MED ORDER — THE SENSUOUS HEART BOOK
Freq: Once | Status: DC
  Filled 2015-11-29: qty 1

## 2015-11-29 MED ORDER — OFF THE BEAT BOOK
Freq: Once | Status: AC
  Administered 2015-11-29: 11:00:00
  Filled 2015-11-29: qty 1

## 2015-11-29 NOTE — Care Management Note (Signed)
Case Management Note  Patient Details  Name: Frances [name] MRN: [id number] Date of Birth: Jul 15, 1934  Subjective/Objective:        Afib, Electrophysiology study and radiofrequency catheter ablation on 11/28/15             Action/Plan: Discharge Planning: Lives at home with husband. Pt can afford medications at home. No NCM needs identified.    Expected Discharge Date:  11/29/15               Expected Discharge Plan:  Home/Self Care  In-House Referral:  NA  Discharge planning Services  NA  Post Acute Care Choice:  NA Choice offered to:  NA  DME Arranged:  N/A DME Agency:  NA  HH Arranged:  NA HH Agency:  NA  Status of Service:  Completed, signed off  Medicare Important Message Given:    Date Medicare IM Given:    Medicare IM give by:    Date Additional Medicare IM Given:    Additional Medicare Important Message give by:     If discussed at [place] of Stay Meetings, dates discussed:    Additional Comments:  Erenest Rasher, RN 11/29/2015, 11:32 AM

## 2015-11-29 NOTE — Discharge Instructions (Signed)
No driving for 4 days. No lifting over 5 lbs for 1 week. No vigorous or sexual activity for 1 week. You may return to work on 12/05/15. Keep procedure site clean & dry. If you notice increased pain, swelling, bleeding or pus, call/return!  You may shower, but no soaking baths/hot tubs/pools for 1 week.    Keep your next routine scheduled INR/lab draw (coumadin check) with your PMD as usual.   You have an appointment set up with the [facility].  Multiple studies have shown that being followed by a dedicated atrial fibrillation clinic in addition to the standard care you receive from your other physicians improves health. We believe that enrollment in the atrial fibrillation clinic will allow [place] to better care for you.   The phone number to the [facility] is [phone]. The clinic is staffed Monday through Friday from 8:30am to 5pm.  Parking Directions: The clinic is located in the Heart and Vascular Building connected to [facility]. 1)From [address] turn on to [address] and go to the 3rd entrance  (Heart and Vascular entrance) on the right. 2)Look to the right for Heart &Vascular Parking Garage. 3)A code for the entrance is required it is 2000, please call the clinic prior to your appointment to confirm.   4)Take the elevators to the 1st floor. Registration is in the room with the glass walls at the end of the hallway.  If you have any trouble parking or locating the clinic, please dont hesitate to call [phone].

## 2015-12-02 ENCOUNTER — Encounter (HOSPITAL_COMMUNITY): Payer: Self-pay | Admitting: Internal Medicine

## 2015-12-09 ENCOUNTER — Telehealth: Payer: Self-pay | Admitting: *Deleted

## 2015-12-09 NOTE — Telephone Encounter (Signed)
Patient was not able to complete her first sleep study due to passing out and being transported to the ED.    She said that medicare sent her a letter that they were billed over $3000 and she is questioning if they will be billed/pay for the second study.  She stated that she is worried that if it is billed that Medicare will deny it and make her responsible.    I let her know that I would see what I could find out and let her know.   Will route to the sleep lab to see if they have any advice.

## 2015-12-17 DIAGNOSIS — Z7901 Long term (current) use of anticoagulants: Secondary | ICD-10-CM | POA: Diagnosis not present

## 2015-12-18 ENCOUNTER — Encounter (HOSPITAL_COMMUNITY): Payer: Self-pay | Admitting: Nurse Practitioner

## 2015-12-18 ENCOUNTER — Ambulatory Visit (HOSPITAL_COMMUNITY)
Admission: RE | Admit: 2015-12-18 | Discharge: 2015-12-18 | Disposition: A | Discharge status: Home / Self Care | Payer: Medicare Other | Source: Ambulatory Visit | Attending: Nurse Practitioner | Admitting: Nurse Practitioner

## 2015-12-18 VITALS — BP 130/68 | HR 88 | Ht 65.0 in | Wt 179.0 lb

## 2015-12-18 DIAGNOSIS — E039 Hypothyroidism, unspecified: Secondary | ICD-10-CM | POA: Insufficient documentation

## 2015-12-18 DIAGNOSIS — N183 Chronic kidney disease, stage 3 (moderate): Secondary | ICD-10-CM | POA: Insufficient documentation

## 2015-12-18 DIAGNOSIS — H9193 Unspecified hearing loss, bilateral: Secondary | ICD-10-CM | POA: Insufficient documentation

## 2015-12-18 DIAGNOSIS — M1990 Unspecified osteoarthritis, unspecified site: Secondary | ICD-10-CM | POA: Diagnosis not present

## 2015-12-18 DIAGNOSIS — I4891 Unspecified atrial fibrillation: Secondary | ICD-10-CM | POA: Diagnosis not present

## 2015-12-18 DIAGNOSIS — I739 Peripheral vascular disease, unspecified: Secondary | ICD-10-CM | POA: Diagnosis not present

## 2015-12-18 DIAGNOSIS — I480 Paroxysmal atrial fibrillation: Secondary | ICD-10-CM

## 2015-12-18 DIAGNOSIS — I129 Hypertensive chronic kidney disease with stage 1 through stage 4 chronic kidney disease, or unspecified chronic kidney disease: Secondary | ICD-10-CM | POA: Diagnosis not present

## 2015-12-18 DIAGNOSIS — Z0189 Encounter for other specified special examinations: Secondary | ICD-10-CM | POA: Insufficient documentation

## 2015-12-18 DIAGNOSIS — E079 Disorder of thyroid, unspecified: Secondary | ICD-10-CM | POA: Diagnosis not present

## 2015-12-18 DIAGNOSIS — Z7901 Long term (current) use of anticoagulants: Secondary | ICD-10-CM | POA: Insufficient documentation

## 2015-12-18 DIAGNOSIS — R0609 Other forms of dyspnea: Secondary | ICD-10-CM | POA: Insufficient documentation

## 2015-12-18 DIAGNOSIS — I482 Chronic atrial fibrillation: Secondary | ICD-10-CM | POA: Diagnosis not present

## 2015-12-18 NOTE — Progress Notes (Signed)
Patient ID: Frances [name], female   DOB: 1934-03-24, 82 y.o.   MRN: [id number]     Primary Care Physician: Mathews Argyle, MD Referring Physician: Dr. Marva Panda is a 82 y.o. female with a h/o afib that had afib ablation 3/23. She was at her PCP office this am and was told her heart rate was irregular and she wanted to be seen. EKG shows SR with pac's. She denies any swallowing issues or rt groin pain. She has had some shortness of breath climbing the steps in her house and she has to rest at the top. This has improved since the procedure. INR yesterday was 2.2.  Today, she denies symptoms of chest pain, shortness of breath, orthopnea, PND, lower extremity edema, dizziness, presyncope, syncope, or neurologic sequela. The patient is tolerating medications without difficulties and is otherwise without complaint today.   Past Medical History  Diagnosis Date  . Thyroid disease     hypothyroidism  . Paroxysmal atrial fibrillation (HCC)   . Internal hemorrhoid   . Rectal bleeding   . SVD (spontaneous vaginal delivery) 1957, 1959    x 2  . Peripheral vascular disease ([place])     right arm and right shoulder blood clots r/t a fall  . Hypothyroidism   . Hypertension     controlled with meds  . Dysrhythmia     Hx - a-fib 05/2010 - tx with meds, no problem since 05/2010  . Chronic kidney disease     stage 3 renal disease - no med  . Arthritis     hands, back  . Hearing loss     bilateral hearing aids   Past Surgical History  Procedure Laterality Date  . Tonsillectomy    . Appendectomy    . Cystocele repair    . Joint replacement      left thumb replacement  . Colonoscopy    . Hand surgery      left  . Hemorrhoid injection  04/2011  . Dental surgery      infected tooth - general  . Dilation and curettage of uterus      x several  . Abdominal hysterectomy      and right ovary  . Back surgery      x 3 - 2 rods and 8 screws  . Anterior and posterior  repair N/A 01/03/2014    Procedure: ANTERIOR (CYSTOCELE) REPAIR;  Surgeon: Floyce Stakes. Pamala Hurry, MD;  Location: [facility];  Service: Gynecology;  Laterality: N/A;  . Examination under anesthesia N/A 01/15/2014    Procedure: EXAM UNDER ANESTHESIA with evacuation of hematoma and over sewing of vaginal mucosa.;  Surgeon: Floyce Stakes. Pamala Hurry, MD;  Location: [facility];  Service: Gynecology;  Laterality: N/A;  . Electrophysiologic study N/A 11/28/2015    Procedure: Atrial Fibrillation Ablation;  Surgeon: [name], MD;  Location: [facility];  Service: Cardiovascular;  Laterality: N/A;    Current Outpatient Prescriptions  Medication Sig Dispense Refill  . Calcium Carbonate-Vitamin D (CALCIUM-VITAMIN D) 500-200 MG-UNIT per tablet Take 1 tablet by mouth daily at 12 noon. Reported on 10/29/2015    . diltiazem (CARDIZEM CD) 180 MG 24 hr capsule Take 1 capsule (180 mg total) by mouth daily.    . fish oil-omega-3 fatty acids 1000 MG capsule Take 1 g by mouth daily.     . flecainide (TAMBOCOR) 100 MG tablet TAKE 1 TABLET BY MOUTH TWICE A DAY 180 tablet 2  . hydrochlorothiazide (HYDRODIURIL)  25 MG tablet Take 25 mg by mouth daily.    Marland Kitchen levothyroxine (SYNTHROID, LEVOTHROID) 75 MCG tablet Take 75 mcg by mouth daily.      . Multiple Vitamin (MULTIVITAMIN) tablet Take 0.5 tablets by mouth 2 (two) times daily.     . pantoprazole (PROTONIX) 40 MG tablet Take 1 tablet (40 mg total) by mouth daily. 45 tablet 0  . Probiotic Product (PROBIOTIC PO) Take 1 tablet by mouth daily.     . ramipril (ALTACE) 10 MG tablet Take 10 mg by mouth daily.      . vitamin C (ASCORBIC ACID) 500 MG tablet Take 500 mg by mouth daily.     Marland Kitchen warfarin (COUMADIN) 2 MG tablet Take 2 mg by mouth daily.    Marland Kitchen warfarin (COUMADIN) 3 MG tablet Take as directed    . NON FORMULARY Take by mouth 2 (two) times daily. Reported on 12/18/2015     No current facility-administered medications for this encounter.    Allergies  Allergen Reactions  .  Pneumovax 23 [Pneumococcal Vac Polyvalent] Other (See Comments)    Arm swelling & fever  . Codeine Nausea And Vomiting  . Cephalexin Anxiety  . Sudafed [Pseudoephedrine Hcl] Anxiety  . Sulfa Antibiotics Rash    Social History   Social History  . Marital Status: Married    Spouse Name: N/A  . Number of Children: N/A  . Years of Education: N/A   Occupational History  . Not on file.   Social History Main Topics  . Smoking status: Never Smoker   . Smokeless tobacco: Never Used  . Alcohol Use: No  . Drug Use: No  . Sexual Activity: Not Currently    Birth Control/ Protection: Post-menopausal   Other Topics Concern  . Not on file   Social History Narrative    Family History  Problem Relation Age of Onset  . Heart disease Mother   . Hypertension Father   . Diabetes Father   . Heart disease Sister   . Hypertension Sister   . Heart disease Brother   . Heart attack Mother   . Heart attack Sister   . Heart failure Brother   . Sudden death Maternal Grandfather   . Stroke Father     ROS- All systems are reviewed and negative except as per the HPI above  Physical Exam: Filed Vitals:   12/18/15 1513  BP: 130/68  Pulse: 88  Height: 5\' 5"  (1.651 m)  Weight: 179 lb (81.194 kg)    GEN- The patient is well appearing, alert and oriented x 3 today.   Head- normocephalic, atraumatic Eyes-  Sclera clear, conjunctiva pink Ears- hearing intact Oropharynx- clear Neck- supple, no JVP Lymph- no cervical lymphadenopathy Lungs- Clear to ausculation bilaterally, normal work of breathing Heart- Regular rate and rhythm, no murmurs, rubs or gallops, PMI not laterally displaced GI- soft, NT, ND, + BS Extremities- no clubbing, cyanosis, or edema MS- no significant deformity or atrophy Skin- no rash or lesion Psych- euthymic mood, full affect Neuro- strength and sensation are intact  EKG- SR with bigeminy PAC's, LAD, IRBBB (on exam rhythm was regular) Epic records  reviewed  Assessment and Plan: 1. Afib ablation Reassured that she was in SR but was irregular due to [facility]' s These are benign and do not require any change in management Continue warfarin Continue flecainide/cardizem  2. Exertional dyspnea Reassured that this would likely resolve with recovery from procedure  F/u as scheduled 4/24  Butch Penny C. Kayleen Memos,  [facility] [address] [place], [place] [address] [phone]

## 2015-12-30 ENCOUNTER — Ambulatory Visit (HOSPITAL_COMMUNITY)
Admit: 2015-12-30 | Discharge: 2015-12-30 | Disposition: A | Discharge status: Home / Self Care | Payer: Medicare Other | Source: Ambulatory Visit | Attending: Nurse Practitioner | Admitting: Nurse Practitioner

## 2015-12-30 ENCOUNTER — Ambulatory Visit (HOSPITAL_COMMUNITY)
Admission: RE | Admit: 2015-12-30 | Discharge: 2015-12-30 | Disposition: A | Discharge status: Home / Self Care | Payer: Medicare Other | Source: Ambulatory Visit | Attending: Nurse Practitioner | Admitting: Nurse Practitioner

## 2015-12-30 ENCOUNTER — Encounter (HOSPITAL_COMMUNITY): Payer: Self-pay | Admitting: Nurse Practitioner

## 2015-12-30 VITALS — BP 124/78 | HR 78 | Ht 65.0 in | Wt 180.8 lb

## 2015-12-30 DIAGNOSIS — R0789 Other chest pain: Secondary | ICD-10-CM | POA: Diagnosis not present

## 2015-12-30 DIAGNOSIS — I129 Hypertensive chronic kidney disease with stage 1 through stage 4 chronic kidney disease, or unspecified chronic kidney disease: Secondary | ICD-10-CM | POA: Diagnosis not present

## 2015-12-30 DIAGNOSIS — N183 Chronic kidney disease, stage 3 (moderate): Secondary | ICD-10-CM | POA: Insufficient documentation

## 2015-12-30 DIAGNOSIS — Z881 Allergy status to other antibiotic agents status: Secondary | ICD-10-CM | POA: Insufficient documentation

## 2015-12-30 DIAGNOSIS — I4891 Unspecified atrial fibrillation: Secondary | ICD-10-CM | POA: Insufficient documentation

## 2015-12-30 DIAGNOSIS — Z86718 Personal history of other venous thrombosis and embolism: Secondary | ICD-10-CM | POA: Diagnosis not present

## 2015-12-30 DIAGNOSIS — R0602 Shortness of breath: Secondary | ICD-10-CM

## 2015-12-30 DIAGNOSIS — Z833 Family history of diabetes mellitus: Secondary | ICD-10-CM | POA: Insufficient documentation

## 2015-12-30 DIAGNOSIS — Z823 Family history of stroke: Secondary | ICD-10-CM | POA: Diagnosis not present

## 2015-12-30 DIAGNOSIS — R079 Chest pain, unspecified: Secondary | ICD-10-CM | POA: Diagnosis not present

## 2015-12-30 DIAGNOSIS — R0609 Other forms of dyspnea: Secondary | ICD-10-CM | POA: Diagnosis not present

## 2015-12-30 DIAGNOSIS — I739 Peripheral vascular disease, unspecified: Secondary | ICD-10-CM | POA: Diagnosis not present

## 2015-12-30 DIAGNOSIS — Z882 Allergy status to sulfonamides status: Secondary | ICD-10-CM | POA: Insufficient documentation

## 2015-12-30 DIAGNOSIS — Z7901 Long term (current) use of anticoagulants: Secondary | ICD-10-CM | POA: Diagnosis not present

## 2015-12-30 DIAGNOSIS — Z885 Allergy status to narcotic agent status: Secondary | ICD-10-CM | POA: Insufficient documentation

## 2015-12-30 DIAGNOSIS — Z79899 Other long term (current) drug therapy: Secondary | ICD-10-CM | POA: Insufficient documentation

## 2015-12-30 DIAGNOSIS — E039 Hypothyroidism, unspecified: Secondary | ICD-10-CM | POA: Diagnosis not present

## 2015-12-30 DIAGNOSIS — Z8249 Family history of ischemic heart disease and other diseases of the circulatory system: Secondary | ICD-10-CM | POA: Insufficient documentation

## 2015-12-30 NOTE — Progress Notes (Signed)
Patient ID: Frances [name], female   DOB: [birth year], 82 y.o.   MRN: [id number]     Primary Care Physician: Mathews Argyle, MD Referring Physician: Dr. Marva Panda is a 82 y.o. female with a h/o afib that had afib ablation 3/23. She was at her PCP office this am and was told her heart rate was irregular and she wanted to be seen. EKG shows SR with pac's. She denies any swallowing issues or rt groin pain. She has had some shortness of breath climbing the steps in her house and she has to rest at the top. This has improved since the procedure. INR yesterday was 2.2.  Pt returns at her usual scheduled appointment post procedure. She is staying in SR but by her apple watch says she sees heart rate from 40 -140. She usually will see the fluctuation with climbing the steps at home. She will have to sit down at the top of the steps for the symptoms of chest pressure and shortness of breath to resolve. This is better than first after ablation but has not resolved. She went to the grocery store the other day and had the same symptoms shopping. She had to stop and lean on the cart for the symptoms to resolve. She had a sleep study before the ablation but had syncope and the study was stopped short. The abbreviated  sleep study data that was collected did not show apnea. She cancelled the recommended repeat sleep study for fear that she would have to pay $3000 out of pocket. Her husband says currently she is not snoring as much. She denies any retention of fluid. No PND , orthopnea. For the most part, she does not feel like she has had afib. No issues with dysphagia or groin pain.  Today, she denies orthopnea, PND, lower extremity edema, dizziness, presyncope, syncope, or neurologic sequela.Positive for shortness of breath chest tightness with activities, relieved with rest. The patient is tolerating medications without difficulties and is otherwise without complaint today.   Past  Medical History  Diagnosis Date  . Thyroid disease     hypothyroidism  . Paroxysmal atrial fibrillation (HCC)   . Internal hemorrhoid   . Rectal bleeding   . SVD (spontaneous vaginal delivery) 1957, 1959    x 2  . Peripheral vascular disease ([place])     right arm and right shoulder blood clots r/t a fall  . Hypothyroidism   . Hypertension     controlled with meds  . Dysrhythmia     Hx - a-fib 05/2010 - tx with meds, no problem since 05/2010  . Chronic kidney disease     stage 3 renal disease - no med  . Arthritis     hands, back  . Hearing loss     bilateral hearing aids   Past Surgical History  Procedure Laterality Date  . Tonsillectomy    . Appendectomy    . Cystocele repair    . Joint replacement      left thumb replacement  . Colonoscopy    . Hand surgery      left  . Hemorrhoid injection  04/2011  . Dental surgery      infected tooth - general  . Dilation and curettage of uterus      x several  . Abdominal hysterectomy      and right ovary  . Back surgery      x 3 - 2 rods and 8 screws  .  Anterior and posterior repair N/A 01/03/2014    Procedure: ANTERIOR (CYSTOCELE) REPAIR;  Surgeon: Floyce Stakes. Pamala Hurry, MD;  Location: [facility];  Service: Gynecology;  Laterality: N/A;  . Examination under anesthesia N/A 01/15/2014    Procedure: EXAM UNDER ANESTHESIA with evacuation of hematoma and over sewing of vaginal mucosa.;  Surgeon: Floyce Stakes. Pamala Hurry, MD;  Location: [facility];  Service: Gynecology;  Laterality: N/A;  . Electrophysiologic study N/A 11/28/2015    Procedure: Atrial Fibrillation Ablation;  Surgeon: [name], MD;  Location: [facility];  Service: Cardiovascular;  Laterality: N/A;    Current Outpatient Prescriptions  Medication Sig Dispense Refill  . Calcium Carbonate-Vitamin D (CALCIUM-VITAMIN D) 500-200 MG-UNIT per tablet Take 1 tablet by mouth daily at 12 noon. Reported on 10/29/2015    . diltiazem (CARDIZEM CD) 180 MG 24 hr capsule Take 1 capsule (180 mg  total) by mouth daily.    . fish oil-omega-3 fatty acids 1000 MG capsule Take 1 g by mouth daily.     . flecainide (TAMBOCOR) 100 MG tablet TAKE 1 TABLET BY MOUTH TWICE A DAY 180 tablet 2  . hydrochlorothiazide (HYDRODIURIL) 25 MG tablet Take 25 mg by mouth daily.    Marland Kitchen levothyroxine (SYNTHROID, LEVOTHROID) 75 MCG tablet Take 75 mcg by mouth daily.      . Multiple Vitamin (MULTIVITAMIN) tablet Take 0.5 tablets by mouth 2 (two) times daily.     . NON FORMULARY Take by mouth 2 (two) times daily. Reported on 12/18/2015    . pantoprazole (PROTONIX) 40 MG tablet Take 1 tablet (40 mg total) by mouth daily. 45 tablet 0  . Probiotic Product (PROBIOTIC PO) Take 1 tablet by mouth daily.     . ramipril (ALTACE) 10 MG tablet Take 10 mg by mouth daily.      . vitamin C (ASCORBIC ACID) 500 MG tablet Take 500 mg by mouth daily.     Marland Kitchen warfarin (COUMADIN) 2 MG tablet Take 2 mg by mouth daily.    Marland Kitchen warfarin (COUMADIN) 3 MG tablet Take as directed     No current facility-administered medications for this encounter.    Allergies  Allergen Reactions  . Pneumovax 23 [Pneumococcal Vac Polyvalent] Other (See Comments)    Arm swelling & fever  . Codeine Nausea And Vomiting  . Cephalexin Anxiety  . Sudafed [Pseudoephedrine Hcl] Anxiety  . Sulfa Antibiotics Rash    Social History   Social History  . Marital Status: Married    Spouse Name: N/A  . Number of Children: N/A  . Years of Education: N/A   Occupational History  . Not on file.   Social History Main Topics  . Smoking status: Never Smoker   . Smokeless tobacco: Never Used  . Alcohol Use: No  . Drug Use: No  . Sexual Activity: Not Currently    Birth Control/ Protection: Post-menopausal   Other Topics Concern  . Not on file   Social History Narrative    Family History  Problem Relation Age of Onset  . Heart disease Mother   . Hypertension Father   . Diabetes Father   . Heart disease Sister   . Hypertension Sister   . Heart disease  Brother   . Heart attack Mother   . Heart attack Sister   . Heart failure Brother   . Sudden death Maternal Grandfather   . Stroke Father     ROS- All systems are reviewed and negative except as per the HPI above  Physical Exam: Filed Vitals:   12/30/15 1352  BP: 124/78  Pulse: 78  Height: 5\' 5"  (1.651 m)  Weight: 180 lb 12.8 oz (82.01 kg)    GEN- The patient is well appearing, alert and oriented x 3 today.   Head- normocephalic, atraumatic Eyes-  Sclera clear, conjunctiva pink Ears- hearing intact Oropharynx- clear Neck- supple, no JVP Lymph- no cervical lymphadenopathy Lungs- Clear to ausculation bilaterally, normal work of breathing Heart- Regular rate and rhythm, no murmurs, rubs or gallops, PMI not laterally displaced GI- soft, NT, ND, + BS Extremities- no clubbing, cyanosis, or edema MS- no significant deformity or atrophy Skin- no rash or lesion Psych- euthymic mood, full affect Neuro- strength and sensation are intact  EKG- SR with first degree av block, pr int 228 ms, qrs int 106 ms, qtc 471 ms Epic records reviewed  Assessment and Plan: 1. Afib ablation  Appears to be staying in SR Continue warfarin Continue flecainide/cardizem  2. Exertional dyspnea/ chest tightness with exertion Reassured that this would likely resolve with recovery from procedure But since symptoms are improved since ablation but persist, will scheduled for lexi myoview Cxray today    F/u as scheduled 5/ 31 with Dr. Marlou Porch, 6/28 with Dr. Lawrence Marseilles C. [place], [facility] [address] [place], [place] [address] [phone]

## 2015-12-30 NOTE — Patient Instructions (Signed)
Scheduler will be in touch with you regarding scheduling the stress test.

## 2016-01-01 ENCOUNTER — Encounter (HOSPITAL_BASED_OUTPATIENT_CLINIC_OR_DEPARTMENT_OTHER): Payer: Medicare Other

## 2016-01-03 DIAGNOSIS — L258 Unspecified contact dermatitis due to other agents: Secondary | ICD-10-CM | POA: Diagnosis not present

## 2016-01-03 DIAGNOSIS — L304 Erythema intertrigo: Secondary | ICD-10-CM | POA: Diagnosis not present

## 2016-01-08 ENCOUNTER — Telehealth (HOSPITAL_COMMUNITY): Payer: Self-pay | Admitting: *Deleted

## 2016-01-08 NOTE — Telephone Encounter (Signed)
Left message on voicemail in reference to upcoming appointment scheduled for 01/13/16. Phone number given for a call back so details instructions can be given. Hubbard Robinson, RN

## 2016-01-09 NOTE — Telephone Encounter (Signed)
Patient is aware of all the information below.  Stated verbal understanding and appreciated my call.  I asked her if Marti Sleigh (supervisor)  Needed to call her back today and she said no this handled her issues.

## 2016-01-09 NOTE — Telephone Encounter (Signed)
Patient called upset regarding a statement she received from medicare that she owed $3000 for the sleep study.  I spoke with Lucy Chris in our billing department and she states that she shows where the hospital adjusted the payment and she owes nothing.   Suanne Marker stated that if she happens to get a bill from [facility] for the physician reading, she needs to call the number on the bill to discuss, but as far as the sleep center, that bill she owes nothing.  Suanne Marker stated that if patient wanted to call the hospital to verify, she could call them at [phone] and give them the account number [id number].

## 2016-01-13 ENCOUNTER — Ambulatory Visit (HOSPITAL_COMMUNITY): Payer: Medicare Other | Attending: Internal Medicine

## 2016-01-13 DIAGNOSIS — I739 Peripheral vascular disease, unspecified: Secondary | ICD-10-CM | POA: Insufficient documentation

## 2016-01-13 DIAGNOSIS — R9439 Abnormal result of other cardiovascular function study: Secondary | ICD-10-CM | POA: Diagnosis not present

## 2016-01-13 DIAGNOSIS — I4891 Unspecified atrial fibrillation: Secondary | ICD-10-CM | POA: Insufficient documentation

## 2016-01-13 DIAGNOSIS — R079 Chest pain, unspecified: Secondary | ICD-10-CM | POA: Insufficient documentation

## 2016-01-13 DIAGNOSIS — R002 Palpitations: Secondary | ICD-10-CM | POA: Diagnosis not present

## 2016-01-13 DIAGNOSIS — I10 Essential (primary) hypertension: Secondary | ICD-10-CM | POA: Insufficient documentation

## 2016-01-13 DIAGNOSIS — R0602 Shortness of breath: Secondary | ICD-10-CM | POA: Insufficient documentation

## 2016-01-13 LAB — MYOCARDIAL PERFUSION IMAGING
CHL CUP NUCLEAR SRS: 3
CHL CUP RESTING HR STRESS: 66 {beats}/min
CSEPPHR: 82 {beats}/min
LV dias vol: 84 mL (ref 46–106)
LVSYSVOL: 31 mL
NUC STRESS TID: 0.83
RATE: 0.26
SDS: 5
SSS: 8

## 2016-01-13 MED ORDER — REGADENOSON 0.4 MG/5ML IV SOLN
0.4000 mg | Freq: Once | INTRAVENOUS | Status: AC
  Administered 2016-01-13: 0.4 mg via INTRAVENOUS

## 2016-01-13 MED ORDER — TECHNETIUM TC 99M SESTAMIBI GENERIC - CARDIOLITE
31.6000 | Freq: Once | INTRAVENOUS | Status: AC | PRN
  Administered 2016-01-13: 32 via INTRAVENOUS

## 2016-01-13 MED ORDER — TECHNETIUM TC 99M SESTAMIBI GENERIC - CARDIOLITE
10.4000 | Freq: Once | INTRAVENOUS | Status: AC | PRN
  Administered 2016-01-13: 10 via INTRAVENOUS

## 2016-01-13 MED ORDER — AMINOPHYLLINE 25 MG/ML IV SOLN
75.0000 mg | Freq: Once | INTRAVENOUS | Status: AC
  Administered 2016-01-13: 75 mg via INTRAVENOUS

## 2016-01-14 DIAGNOSIS — Z7901 Long term (current) use of anticoagulants: Secondary | ICD-10-CM | POA: Diagnosis not present

## 2016-01-20 ENCOUNTER — Ambulatory Visit (INDEPENDENT_AMBULATORY_CARE_PROVIDER_SITE_OTHER): Payer: Medicare Other | Admitting: Nurse Practitioner

## 2016-01-20 ENCOUNTER — Encounter: Payer: Self-pay | Admitting: Nurse Practitioner

## 2016-01-20 VITALS — BP 120/72 | HR 68 | Ht 65.0 in | Wt 176.6 lb

## 2016-01-20 DIAGNOSIS — R079 Chest pain, unspecified: Secondary | ICD-10-CM

## 2016-01-20 DIAGNOSIS — I480 Paroxysmal atrial fibrillation: Secondary | ICD-10-CM

## 2016-01-20 DIAGNOSIS — R0602 Shortness of breath: Secondary | ICD-10-CM

## 2016-01-20 NOTE — Progress Notes (Signed)
CARDIOLOGY OFFICE NOTE  Date:  01/20/2016    Frances [name] Date of Birth: 07-09-1934 Medical Record [id number]  PCP:  Mathews Argyle, MD  Cardiologist:  Allred    Chief Complaint  Patient presents with  . Chest Pain  . Shortness of Breath    Follow up after abnormal Myoview - seen for Dr. Marlou Porch    History of Present Illness: Frances [name] is a 82 y.o. female who presents today for a work in visit following an abnormal stress test.   She has a history of PAF with recent ablation on 11/28/2015, hypothyroidism, PAD, CKD, HTN and OA.   Last seen in January by Dr. Rayann Heman.   Was seen in the AF clinic just a few weeks ago - had been to her PCP and was told that her HR was irregular. EKG was NSR with PACs. She endorsed a feeling of chest pressure and shortness of breath with exertion. Was referred on for Myoview - see below.   Comes back today. Here with her husband. She notes that right after her ablation her symptoms of DOE/chest pressure started. She would have to stop and rest with just going up her stairs at home. She actually moved her pots/pans/dishes that she most frequently uses to her dining room table. These symptoms have now improved and she feels like she is doing better. She has been able to move the pots/pans/dishes back to their cabinets. She can go up her stairs and not get short winded - she still gets tired - but not short winded. She feels like her symptoms are clearly improving. She has had some AF noted with elevated HR.   Past Medical History  Diagnosis Date  . Thyroid disease     hypothyroidism  . Paroxysmal atrial fibrillation (HCC)   . Internal hemorrhoid   . Rectal bleeding   . SVD (spontaneous vaginal delivery) 1957, 1959    x 2  . Peripheral vascular disease ([place])     right arm and right shoulder blood clots r/t a fall  . Hypothyroidism   . Hypertension     controlled with meds  . Dysrhythmia     Hx - a-fib 05/2010 - tx with  meds, no problem since 05/2010  . Chronic kidney disease     stage 3 renal disease - no med  . Arthritis     hands, back  . Hearing loss     bilateral hearing aids    Past Surgical History  Procedure Laterality Date  . Tonsillectomy    . Appendectomy    . Cystocele repair    . Joint replacement      left thumb replacement  . Colonoscopy    . Hand surgery      left  . Hemorrhoid injection  04/2011  . Dental surgery      infected tooth - general  . Dilation and curettage of uterus      x several  . Abdominal hysterectomy      and right ovary  . Back surgery      x 3 - 2 rods and 8 screws  . Anterior and posterior repair N/A 01/03/2014    Procedure: ANTERIOR (CYSTOCELE) REPAIR;  Surgeon: Floyce Stakes. Pamala Hurry, MD;  Location: [facility];  Service: Gynecology;  Laterality: N/A;  . Examination under anesthesia N/A 01/15/2014    Procedure: EXAM UNDER ANESTHESIA with evacuation of hematoma and over sewing of vaginal mucosa.;  Surgeon: Floyce Stakes. Pamala Hurry,  MD;  Location: [facility];  Service: Gynecology;  Laterality: N/A;  . Electrophysiologic study N/A 11/28/2015    Procedure: Atrial Fibrillation Ablation;  Surgeon: [name], MD;  Location: [facility];  Service: Cardiovascular;  Laterality: N/A;     Medications: Current Outpatient Prescriptions  Medication Sig Dispense Refill  . Calcium Carbonate-Vitamin D (CALCIUM-VITAMIN D) 500-200 MG-UNIT per tablet Take 1 tablet by mouth daily at 12 noon. Reported on 10/29/2015    . diltiazem (CARDIZEM CD) 180 MG 24 hr capsule Take 1 capsule (180 mg total) by mouth daily.    . fish oil-omega-3 fatty acids 1000 MG capsule Take 1 g by mouth daily.     . flecainide (TAMBOCOR) 100 MG tablet TAKE 1 TABLET BY MOUTH TWICE A DAY 180 tablet 2  . hydrochlorothiazide (HYDRODIURIL) 25 MG tablet Take 25 mg by mouth daily.    Marland Kitchen levothyroxine (SYNTHROID, LEVOTHROID) 75 MCG tablet Take 75 mcg by mouth daily.      . Multiple Vitamin (MULTIVITAMIN) tablet Take 0.5  tablets by mouth 2 (two) times daily.     . NON FORMULARY Take by mouth 2 (two) times daily. Reported on 12/18/2015    . Probiotic Product (PROBIOTIC PO) Take 1 tablet by mouth daily.     . ramipril (ALTACE) 10 MG tablet Take 10 mg by mouth daily.      . vitamin C (ASCORBIC ACID) 500 MG tablet Take 500 mg by mouth daily.     Marland Kitchen warfarin (COUMADIN) 2 MG tablet Take 2 mg by mouth daily.    Marland Kitchen warfarin (COUMADIN) 3 MG tablet Take as directed     No current facility-administered medications for this visit.    Allergies: Allergies  Allergen Reactions  . Pneumovax 23 [Pneumococcal Vac Polyvalent] Other (See Comments)    Arm swelling & fever  . Codeine Nausea And Vomiting  . Cephalexin Anxiety  . Sudafed [Pseudoephedrine Hcl] Anxiety  . Sulfa Antibiotics Rash    Social History: The patient  reports that she has never smoked. She has never used smokeless tobacco. She reports that she does not drink alcohol or use illicit drugs.   Family History: The patient's family history includes Diabetes in her father; Heart attack in her mother and sister; Heart disease in her brother, mother, and sister; Heart failure in her brother; Hypertension in her father and sister; Stroke in her father; Sudden death in her maternal grandfather.   Review of Systems: Please see the history of present illness.   Otherwise, the review of systems is positive for none.   All other systems are reviewed and negative.   Physical Exam: VS:  BP 120/72 mmHg  Pulse 68  Ht 5\' 5"  (1.651 m)  Wt 176 lb 9.6 oz (80.105 kg)  BMI 29.39 kg/m2 .  BMI Body mass index is 29.39 kg/(m^2).  Wt Readings from Last 3 Encounters:  01/20/16 176 lb 9.6 oz (80.105 kg)  12/30/15 180 lb 12.8 oz (82.01 kg)  12/18/15 179 lb (81.194 kg)    General: Pleasant. She looks much younger than her stated age. She is alert and in no acute distress.  HEENT: Normal. Neck: Supple, no JVD, carotid bruits, or masses noted.  Cardiac: Regular rate and  rhythm. No murmurs, rubs, or gallops. No edema.  Respiratory:  Lungs are clear to auscultation bilaterally with normal work of breathing.  GI: Soft and nontender.  MS: No deformity or atrophy. Gait and ROM intact. Skin: Warm and dry. Color is normal.  Neuro:  Strength and sensation are intact and no gross focal deficits noted.  Psych: Alert, appropriate and with normal affect.   LABORATORY DATA:  EKG:  EKG is not ordered today.  Lab Results  Component Value Date   WBC 6.0 11/14/2015   HGB 14.1 11/14/2015   HCT 42.4 11/14/2015   PLT 221 11/14/2015   GLUCOSE 92 11/14/2015   NA 140 11/14/2015   K 4.1 11/14/2015   CL 102 11/14/2015   CREATININE 0.93* 11/14/2015   BUN 16 11/14/2015   CO2 27 11/14/2015   INR 2.28* 11/29/2015    BNP (last 3 results) No results for input(s): BNP in the last 8760 hours.  ProBNP (last 3 results) No results for input(s): PROBNP in the last 8760 hours.   Other Studies Reviewed Today:  Myoview Study Highlights from 01/2016     Nuclear stress EF: 63%.  No T wave inversion was noted during stress.  There was no ST segment deviation noted during stress.  Defect 1: There is a medium defect of moderate severity.  Findings consistent with ischemia.  This is an intermediate risk study.  Moderate size and severity, partially reversible (SDS 5) inferior and inferoseptal perfusion defect suggestive of ischemia. There is underlying bowel artifact in this area, therefore attenuation artifact cannot be excluded. LVEF 63% with mild basal inferoseptal hypokinesis. This is an intermediate risk study.    Assessment/Plan: 1. Chest pressure and DOE - her symptoms are clearly improving since her ablation.   2. Abnormal Myoview - could not exclude attenuation bowel artifact. Would favor continued medical management and see how she does over the next several weeks. I do not feel like she needs cardiac cath at this time.    3. AF ablation 11/28/2015 - to be  on anticoagulation uninterrupted ideally 3 months.   4. CKD  5. HTN - BP ok on current regimen.   Current medicines are reviewed with the patient today.  The patient does not have concerns regarding medicines other than what has been noted above.  The following changes have been made:  See above.  Labs/ tests ordered today include:   No orders of the defined types were placed in this encounter.     Disposition:   FU with Dr. Marlou Porch as planned later this month.    Patient is agreeable to this plan and will call if any problems develop in the interim.   Signed: Burtis Junes, RN, ANP-C 01/20/2016 4:04 PM  [facility] [address] [place], [place]  [address] Phone: [phone] Fax: [phone]

## 2016-01-20 NOTE — Patient Instructions (Signed)
We will be checking the following labs today - NONE   Medication Instructions:    Continue with your current medicines.     Testing/Procedures To Be Arranged:  N/A  Follow-Up:   See Dr. Marlou Porch as planned.     Other Special Instructions:   Continue to increase your activities as tolerated.     If you need a refill on your cardiac medications before your next appointment, please call your pharmacy.   Call the [place] office at [phone] if you have any questions, problems or concerns.

## 2016-02-05 ENCOUNTER — Encounter: Payer: Self-pay | Admitting: Cardiology

## 2016-02-05 ENCOUNTER — Ambulatory Visit (INDEPENDENT_AMBULATORY_CARE_PROVIDER_SITE_OTHER): Payer: Medicare Other | Admitting: Cardiology

## 2016-02-05 VITALS — BP 150/80 | HR 73 | Ht 65.0 in | Wt 177.1 lb

## 2016-02-05 DIAGNOSIS — I440 Atrioventricular block, first degree: Secondary | ICD-10-CM

## 2016-02-05 DIAGNOSIS — I480 Paroxysmal atrial fibrillation: Secondary | ICD-10-CM

## 2016-02-05 DIAGNOSIS — R0602 Shortness of breath: Secondary | ICD-10-CM | POA: Diagnosis not present

## 2016-02-05 NOTE — Progress Notes (Signed)
Cardiology Office Note    Date:  02/05/2016   ID:  Frances [name], DOB 10/12/1933, MRN [id number]  PCP:  Mathews Argyle, MD  Cardiologist:   [name], MD     History of Present Illness:  Frances [name] is a 82 y.o. female here for follow-up of recent abnormal stress test. She had a history of paroxysmal atrial fibrillation with ablation therapy on 11/28/15 with hypothyroidism, CK D, hypertension, PAD. Dr. Rayann Heman. She was seen by Roderic Palau and endorsed some chest discomfort, pressure shortness of breath. Myoview was performed and was interpreted as intermediate risk.  Right after her ablation she did notice some symptoms of chest pressure and shortness of breath and she would have to stop and rest with going up stairs. The symptoms were improved when she saw Truitt Merle on 01/20/16.  Still feeling a little tired. Mowed yard when husband broke knee cap.  Still feeling hot flashes after several years. Sometimes this accompanies palpitations.   Past Medical History  Diagnosis Date  . Thyroid disease     hypothyroidism  . Paroxysmal atrial fibrillation (HCC)   . Internal hemorrhoid   . Rectal bleeding   . SVD (spontaneous vaginal delivery) 1957, 1959    x 2  . Peripheral vascular disease ([place])     right arm and right shoulder blood clots r/t a fall  . Hypothyroidism   . Hypertension     controlled with meds  . Dysrhythmia     Hx - a-fib 05/2010 - tx with meds, no problem since 05/2010  . Chronic kidney disease     stage 3 renal disease - no med  . Arthritis     hands, back  . Hearing loss     bilateral hearing aids    Past Surgical History  Procedure Laterality Date  . Tonsillectomy    . Appendectomy    . Cystocele repair    . Joint replacement      left thumb replacement  . Colonoscopy    . Hand surgery      left  . Hemorrhoid injection  04/2011  . Dental surgery      infected tooth - general  . Dilation and curettage of uterus      x  several  . Abdominal hysterectomy      and right ovary  . Back surgery      x 3 - 2 rods and 8 screws  . Anterior and posterior repair N/A 01/03/2014    Procedure: ANTERIOR (CYSTOCELE) REPAIR;  Surgeon: Floyce Stakes. Pamala Hurry, MD;  Location: [facility];  Service: Gynecology;  Laterality: N/A;  . Examination under anesthesia N/A 01/15/2014    Procedure: EXAM UNDER ANESTHESIA with evacuation of hematoma and over sewing of vaginal mucosa.;  Surgeon: Floyce Stakes. Pamala Hurry, MD;  Location: [facility];  Service: Gynecology;  Laterality: N/A;  . Electrophysiologic study N/A 11/28/2015    Procedure: Atrial Fibrillation Ablation;  Surgeon: [name], MD;  Location: [facility];  Service: Cardiovascular;  Laterality: N/A;    Current Medications: Outpatient Prescriptions Prior to Visit  Medication Sig Dispense Refill  . Calcium Carbonate-Vitamin D (CALCIUM-VITAMIN D) 500-200 MG-UNIT per tablet Take 1 tablet by mouth daily at 12 noon. Reported on 10/29/2015    . diltiazem (CARDIZEM CD) 180 MG 24 hr capsule Take 1 capsule (180 mg total) by mouth daily.    . fish oil-omega-3 fatty acids 1000 MG capsule Take 1 g by mouth daily.     Marland Kitchen  flecainide (TAMBOCOR) 100 MG tablet TAKE 1 TABLET BY MOUTH TWICE A DAY 180 tablet 2  . hydrochlorothiazide (HYDRODIURIL) 25 MG tablet Take 25 mg by mouth daily.    Marland Kitchen levothyroxine (SYNTHROID, LEVOTHROID) 75 MCG tablet Take 75 mcg by mouth daily.      . Multiple Vitamin (MULTIVITAMIN) tablet Take 0.5 tablets by mouth 2 (two) times daily.     . Probiotic Product (PROBIOTIC PO) Take 1 tablet by mouth daily.     . ramipril (ALTACE) 10 MG tablet Take 10 mg by mouth daily.      . vitamin C (ASCORBIC ACID) 500 MG tablet Take 500 mg by mouth daily.     Marland Kitchen warfarin (COUMADIN) 2 MG tablet Take 2 mg by mouth daily.    Marland Kitchen warfarin (COUMADIN) 3 MG tablet Take as directed    . NON FORMULARY Take by mouth 2 (two) times daily. Reported on 12/18/2015     No facility-administered medications prior to  visit.     Allergies:   Pneumovax 23; Codeine; Cephalexin; Sudafed; and Sulfa antibiotics   Social History   Social History  . Marital Status: Married    Spouse Name: N/A  . Number of Children: N/A  . Years of Education: N/A   Social History Main Topics  . Smoking status: Never Smoker   . Smokeless tobacco: Never Used  . Alcohol Use: No  . Drug Use: No  . Sexual Activity: Not Currently    Birth Control/ Protection: Post-menopausal   Other Topics Concern  . None   Social History Narrative     Family History:  The patient's family history includes Diabetes in her father; Heart attack in her mother and sister; Heart disease in her brother, mother, and sister; Heart failure in her brother; Hypertension in her father and sister; Stroke in her father; Sudden death in her maternal grandfather.   ROS:   Please see the history of present illness.    ROS All other systems reviewed and are negative.   PHYSICAL EXAM:   VS:  BP 150/80 mmHg  Pulse 73  Ht 5\' 5"  (1.651 m)  Wt 177 lb 1.9 oz (80.341 kg)  BMI 29.47 kg/m2   GEN: Well nourished, well developed, in no acute distress HEENT: normal Neck: no JVD, carotid bruits, or masses Cardiac: RRR; no murmurs, rubs, or gallops,no edema  Respiratory:  clear to auscultation bilaterally, normal work of breathing GI: soft, nontender, nondistended, + BS MS: no deformity or atrophy Skin: warm and dry, no rash Neuro:  Alert and Oriented x 3, Strength and sensation are intact Psych: euthymic mood, full affect  Wt Readings from Last 3 Encounters:  02/05/16 177 lb 1.9 oz (80.341 kg)  01/20/16 176 lb 9.6 oz (80.105 kg)  12/30/15 180 lb 12.8 oz (82.01 kg)      Studies/Labs Reviewed:   EKG:  EKG is not ordered today.    Recent Labs: 11/14/2015: BUN 16; Creat 0.93*; Hemoglobin 14.1; Platelets 221; Potassium 4.1; Sodium 140   Lipid Panel No results found for: CHOL, TRIG, HDL, CHOLHDL, VLDL, LDLCALC, LDLDIRECT  Additional studies/  records that were reviewed today include:  Prior office visit, lab work, stress test reviewed    ASSESSMENT:    1. SOB (shortness of breath)   2. Paroxysmal atrial fibrillation (HCC)   3. First degree AV block      PLAN:  In order of problems listed above:  Paroxysmal atrial fibrillation  - Post ablation. 11/28/15  - Overall  she is having some PACs but no prolonged evidence of atrial fibrillation.  Dyspnea  - Improved. Noticing it going up stairs.  - Continue with conditioning efforts.  Essential hypertension  - Mildly elevated today. Overall has been under good control.  Anticoagulation  - Continue. Ideally, she states that she would like to be on Eliquis but this is not on her formulary. She will continue with current anticoagulation strategy.  Abnormal Myoview  - Intermediate risk. Could be attenuation artifact. Continue with medical management. No need for cardiac catheterization. She is feeling better.    Medication Adjustments/Labs and Tests Ordered: Current medicines are reviewed at length with the patient today.  Concerns regarding medicines are outlined above.  Medication changes, Labs and Tests ordered today are listed in the Patient Instructions below. Patient Instructions  Medication Instructions:  Same-no changes  Labwork: None  Testing/Procedures: None  Follow-Up: Your physician wants you to follow-up in: 6 months. You will receive a reminder letter in the mail two months in advance. If you don't receive a letter, please call our office to schedule the follow-up appointment.     If you need a refill on your cardiac medications before your next appointment, please call your pharmacy.       Signed, [name], MD  02/05/2016 11:37 AM    [facility] [place], [place], [place]  [address] Phone: [phone]; Fax: [phone]

## 2016-02-05 NOTE — Patient Instructions (Signed)
**Note De-identified Frances [name] Obfuscation** Medication Instructions:  Same-no changes  Labwork: None  Testing/Procedures: None  Follow-Up: Your physician wants you to follow-up in: 6 months. You will receive a reminder letter in the mail two months in advance. If you don't receive a letter, please call our office to schedule the follow-up appointment.      If you need a refill on your cardiac medications before your next appointment, please call your pharmacy.   

## 2016-02-11 DIAGNOSIS — Z7901 Long term (current) use of anticoagulants: Secondary | ICD-10-CM | POA: Diagnosis not present

## 2016-03-04 ENCOUNTER — Encounter: Payer: Self-pay | Admitting: Internal Medicine

## 2016-03-04 ENCOUNTER — Ambulatory Visit (INDEPENDENT_AMBULATORY_CARE_PROVIDER_SITE_OTHER): Payer: Medicare Other | Admitting: Internal Medicine

## 2016-03-04 VITALS — BP 122/72 | HR 76 | Ht 65.0 in | Wt 175.2 lb

## 2016-03-04 DIAGNOSIS — I480 Paroxysmal atrial fibrillation: Secondary | ICD-10-CM

## 2016-03-04 DIAGNOSIS — I10 Essential (primary) hypertension: Secondary | ICD-10-CM | POA: Diagnosis not present

## 2016-03-04 NOTE — Progress Notes (Signed)
Electrophysiology Office Note   Date:  03/04/2016   ID:  HADLY FARRIN, DOB [birth year], MRN [id number]  PCP:  Mathews Argyle, MD  Cardiologist:  Dr Marlou Porch Primary Electrophysiologist: [name], MD    Chief Complaint  Patient presents with  . Atrial Fibrillation     History of Present Illness: [name] is a 82 y.o. female who presents today for electrophysiology evaluation.   She has done well since her afib ablation.  Denies any afib but has pacs.  Denies procedure related complications and is pleased with results.  She has SOB with climbing stairs which is chronic but is able to mow the lawn without difficulty. Today, she denies symptoms of chest pain,  orthopnea, PND, lower extremity edema, claudication, presyncope, syncope, bleeding, or neurologic sequela. The patient is tolerating medications without difficulties and is otherwise without complaint today.    Past Medical History  Diagnosis Date  . Thyroid disease     hypothyroidism  . Paroxysmal atrial fibrillation (HCC)   . Internal hemorrhoid   . Rectal bleeding   . SVD (spontaneous vaginal delivery) 1957, 1959    x 2  . Peripheral vascular disease ([place])     right arm and right shoulder blood clots r/t a fall  . Hypothyroidism   . Hypertension     controlled with meds  . Dysrhythmia     Hx - a-fib 05/2010 - tx with meds, no problem since 05/2010  . Chronic kidney disease     stage 3 renal disease - no med  . Arthritis     hands, back  . Hearing loss     bilateral hearing aids   Past Surgical History  Procedure Laterality Date  . Tonsillectomy    . Appendectomy    . Cystocele repair    . Joint replacement      left thumb replacement  . Colonoscopy    . Hand surgery      left  . Hemorrhoid injection  04/2011  . Dental surgery      infected tooth - general  . Dilation and curettage of uterus      x several  . Abdominal hysterectomy      and right ovary  . Back surgery      x 3 -  2 rods and 8 screws  . Anterior and posterior repair N/A 01/03/2014    Procedure: ANTERIOR (CYSTOCELE) REPAIR;  Surgeon: Floyce Stakes. Pamala Hurry, MD;  Location: [facility];  Service: Gynecology;  Laterality: N/A;  . Examination under anesthesia N/A 01/15/2014    Procedure: EXAM UNDER ANESTHESIA with evacuation of hematoma and over sewing of vaginal mucosa.;  Surgeon: Floyce Stakes. Pamala Hurry, MD;  Location: [facility];  Service: Gynecology;  Laterality: N/A;  . Electrophysiologic study N/A 11/28/2015    Procedure: Atrial Fibrillation Ablation;  Surgeon: [name], MD;  Location: [facility];  Service: Cardiovascular;  Laterality: N/A;     Current Outpatient Prescriptions  Medication Sig Dispense Refill  . Calcium Carbonate-Vitamin D (CALCIUM-VITAMIN D) 500-200 MG-UNIT per tablet Take 1 tablet by mouth daily at 12 noon. Reported on 10/29/2015    . diltiazem (CARDIZEM CD) 180 MG 24 hr capsule Take 1 capsule (180 mg total) by mouth daily.    . fish oil-omega-3 fatty acids 1000 MG capsule Take 1 g by mouth daily.     . flecainide (TAMBOCOR) 100 MG tablet TAKE 1 TABLET BY MOUTH TWICE A DAY 180 tablet 2  . hydrochlorothiazide (  HYDRODIURIL) 25 MG tablet Take 25 mg by mouth daily.    Marland Kitchen levothyroxine (SYNTHROID, LEVOTHROID) 75 MCG tablet Take 75 mcg by mouth daily.      . Multiple Vitamin (MULTIVITAMIN) tablet Take 0.5 tablets by mouth 2 (two) times daily.     . Probiotic Product (PROBIOTIC PO) Take 1 tablet by mouth daily.     . ramipril (ALTACE) 10 MG tablet Take 10 mg by mouth daily.      . vitamin C (ASCORBIC ACID) 500 MG tablet Take 500 mg by mouth daily.     Marland Kitchen warfarin (COUMADIN) 1 MG tablet as directed.    . warfarin (COUMADIN) 3 MG tablet Take as directed (Also has 1 mg tablet)     No current facility-administered medications for this visit.    Allergies:   Pneumovax 23; Codeine; Cephalexin; Sudafed; and Sulfa antibiotics   Social History:  The patient  reports that she has never smoked. She has never  used smokeless tobacco. She reports that she does not drink alcohol or use illicit drugs.   Family History:  The patient's  family history includes Diabetes in her father; Heart attack in her mother and sister; Heart disease in her brother, mother, and sister; Heart failure in her brother; Hypertension in her father and sister; Stroke in her father; Sudden death in her maternal grandfather.    ROS:  Please see the history of present illness.   All other systems are reviewed and negative.    PHYSICAL EXAM: VS:  BP 122/72 mmHg  Pulse 76  Ht 5\' 5"  (1.651 m)  Wt 175 lb 3.2 oz (79.47 kg)  BMI 29.15 kg/m2 , BMI Body mass index is 29.15 kg/(m^2). GEN: Well nourished, well developed, in no acute distress HEENT: normal Neck: no JVD, carotid bruits, or masses Cardiac: RRR; no murmurs, rubs, or gallops,no edema  Respiratory:  clear to auscultation bilaterally, normal work of breathing GI: soft, nontender, nondistended, + BS MS: no deformity or atrophy Skin: warm and dry  Neuro:  Strength and sensation are intact Psych: euthymic mood, full affect  EKG:  EKG is ordered today. The ekg ordered today shows sinus rhythm 76 bpm, PR 236 msec, QRS 110, Qtc 479 msec, incomplete RBBB, lAHB   Recent Labs: 11/14/2015: BUN 16; Creat 0.93*; Hemoglobin 14.1; Platelets 221; Potassium 4.1; Sodium 140    Lipid Panel  No results found for: CHOL, TRIG, HDL, CHOLHDL, VLDL, LDLCALC, LDLDIRECT   Wt Readings from Last 3 Encounters:  03/04/16 175 lb 3.2 oz (79.47 kg)  02/05/16 177 lb 1.9 oz (80.341 kg)  01/20/16 176 lb 9.6 oz (80.105 kg)     ASSESSMENT AND PLAN:  1.  Paroxysmal atrial fibrillation Doing well s/p ablation Stop flecainide Consider stopping diltiazem on return chads2vasc score is at least 4.  She is on coumadin.  Considered eliquis but is not taking due to costs.  May consider xarelto but wants to wait until next year  2. HTN Stable No change required today  Return to see me in 3  months  Current medicines are reviewed at length with the patient today.   The patient does not have concerns regarding her medicines.  The following changes were made today:  none   Signed, [name], MD  03/04/2016 12:35 PM     [place] [address] [phone] (office) [phone] (fax)

## 2016-03-04 NOTE — Patient Instructions (Signed)
Medication Instructions:  Your physician has recommended you make the following change in your medication:  1) Stop Flecainide   Labwork: None ordered   Testing/Procedures: None ordered    Follow-Up: Your physician recommends that you schedule a follow-up appointment in: 3 months with Dr Rayann Heman   Any Other Special Instructions Will Be Listed Below (If Applicable).     If you need a refill on your cardiac medications before your next appointment, please call your pharmacy.

## 2016-03-09 DIAGNOSIS — N390 Urinary tract infection, site not specified: Secondary | ICD-10-CM | POA: Diagnosis not present

## 2016-03-17 DIAGNOSIS — Z7901 Long term (current) use of anticoagulants: Secondary | ICD-10-CM | POA: Diagnosis not present

## 2016-03-19 DIAGNOSIS — Z124 Encounter for screening for malignant neoplasm of cervix: Secondary | ICD-10-CM | POA: Diagnosis not present

## 2016-03-19 DIAGNOSIS — Z1231 Encounter for screening mammogram for malignant neoplasm of breast: Secondary | ICD-10-CM | POA: Diagnosis not present

## 2016-04-07 DIAGNOSIS — Z7901 Long term (current) use of anticoagulants: Secondary | ICD-10-CM | POA: Diagnosis not present

## 2016-04-21 DIAGNOSIS — Z7901 Long term (current) use of anticoagulants: Secondary | ICD-10-CM | POA: Diagnosis not present

## 2016-05-08 DIAGNOSIS — R350 Frequency of micturition: Secondary | ICD-10-CM | POA: Diagnosis not present

## 2016-05-08 DIAGNOSIS — I480 Paroxysmal atrial fibrillation: Secondary | ICD-10-CM | POA: Diagnosis not present

## 2016-05-12 DIAGNOSIS — I480 Paroxysmal atrial fibrillation: Secondary | ICD-10-CM | POA: Diagnosis not present

## 2016-05-14 DIAGNOSIS — Z23 Encounter for immunization: Secondary | ICD-10-CM | POA: Diagnosis not present

## 2016-05-16 ENCOUNTER — Other Ambulatory Visit: Payer: Self-pay | Admitting: Cardiology

## 2016-05-19 DIAGNOSIS — Z7901 Long term (current) use of anticoagulants: Secondary | ICD-10-CM | POA: Diagnosis not present

## 2016-05-20 DIAGNOSIS — H25813 Combined forms of age-related cataract, bilateral: Secondary | ICD-10-CM | POA: Diagnosis not present

## 2016-05-20 DIAGNOSIS — H25013 Cortical age-related cataract, bilateral: Secondary | ICD-10-CM | POA: Diagnosis not present

## 2016-05-20 DIAGNOSIS — H5203 Hypermetropia, bilateral: Secondary | ICD-10-CM | POA: Diagnosis not present

## 2016-05-20 DIAGNOSIS — H52223 Regular astigmatism, bilateral: Secondary | ICD-10-CM | POA: Diagnosis not present

## 2016-05-20 DIAGNOSIS — H2513 Age-related nuclear cataract, bilateral: Secondary | ICD-10-CM | POA: Diagnosis not present

## 2016-06-02 DIAGNOSIS — Z7901 Long term (current) use of anticoagulants: Secondary | ICD-10-CM | POA: Diagnosis not present

## 2016-06-03 ENCOUNTER — Ambulatory Visit (INDEPENDENT_AMBULATORY_CARE_PROVIDER_SITE_OTHER): Payer: Medicare Other | Admitting: Internal Medicine

## 2016-06-03 ENCOUNTER — Encounter: Payer: Self-pay | Admitting: Internal Medicine

## 2016-06-03 VITALS — BP 126/80 | HR 73 | Ht 65.0 in | Wt 175.6 lb

## 2016-06-03 DIAGNOSIS — I10 Essential (primary) hypertension: Secondary | ICD-10-CM | POA: Diagnosis not present

## 2016-06-03 DIAGNOSIS — I480 Paroxysmal atrial fibrillation: Secondary | ICD-10-CM | POA: Diagnosis not present

## 2016-06-03 NOTE — Patient Instructions (Addendum)
Medication Instructions:  Your physician recommends that you continue on your current medications as directed. Please refer to the Current Medication list given to you today.   Labwork: None ordered   Testing/Procedures:   Your physician recommends you have a Loop Recorder implanted.06/08/16  Please arrive at the [facility] at 6:30am   Follow-Up: Your physician wants you to follow-up in: 10-14 days wound check with the Device clinic and 3 months with Dr. Rayann Heman.  Any Other Special Instructions Will Be Listed Below (If Applicable).     If you need a refill on your cardiac medications before your next appointment, please call your pharmacy.

## 2016-06-03 NOTE — Progress Notes (Signed)
Electrophysiology Office Note   Date:  06/03/2016   ID:  Frances [name], DOB Jan 24, 1934, MRN [id number]  PCP:  Mathews Argyle, MD  Cardiologist:  Dr Marlou Porch Primary Electrophysiologist: [name], MD    Chief Complaint  Patient presents with  . Atrial Fibrillation     History of Present Illness: Frances [name] is a 82 y.o. female who presents today for electrophysiology evaluation.   She has done well since her afib ablation.  She has occasional tachy palpitations.  These are noticeable when walking up stairs or when she has a hot flash.  This feels different from her AFib. Today, she denies symptoms of chest pain,  orthopnea, PND, lower extremity edema, claudication, presyncope, syncope, bleeding, or neurologic sequela. The patient is tolerating medications without difficulties and is otherwise without complaint today.    Past Medical History:  Diagnosis Date  . Arthritis    hands, back  . Chronic kidney disease    stage 3 renal disease - no med  . Dysrhythmia    Hx - a-fib 05/2010 - tx with meds, no problem since 05/2010  . Hearing loss    bilateral hearing aids  . Hypertension    controlled with meds  . Hypothyroidism   . Internal hemorrhoid   . Paroxysmal atrial fibrillation (HCC)   . Peripheral vascular disease ([place])    right arm and right shoulder blood clots r/t a fall  . Rectal bleeding   . SVD (spontaneous vaginal delivery) 1957, 1959   x 2  . Thyroid disease    hypothyroidism   Past Surgical History:  Procedure Laterality Date  . ABDOMINAL HYSTERECTOMY     and right ovary  . ANTERIOR AND POSTERIOR REPAIR N/A 01/03/2014   Procedure: ANTERIOR (CYSTOCELE) REPAIR;  Surgeon: Floyce Stakes. Pamala Hurry, MD;  Location: [facility];  Service: Gynecology;  Laterality: N/A;  . APPENDECTOMY    . BACK SURGERY     x 3 - 2 rods and 8 screws  . COLONOSCOPY    . CYSTOCELE REPAIR    . DENTAL SURGERY     infected tooth - general  . DILATION AND CURETTAGE OF UTERUS      x several  . ELECTROPHYSIOLOGIC STUDY N/A 11/28/2015   Procedure: Atrial Fibrillation Ablation;  Surgeon: [name], MD;  Location: [facility];  Service: Cardiovascular;  Laterality: N/A;  . EXAMINATION UNDER ANESTHESIA N/A 01/15/2014   Procedure: EXAM UNDER ANESTHESIA with evacuation of hematoma and over sewing of vaginal mucosa.;  Surgeon: Floyce Stakes. Pamala Hurry, MD;  Location: [facility];  Service: Gynecology;  Laterality: N/A;  . HAND SURGERY     left  . hemorrhoid injection  04/2011  . JOINT REPLACEMENT     left thumb replacement  . TONSILLECTOMY       Current Outpatient Prescriptions  Medication Sig Dispense Refill  . Calcium Carbonate-Vitamin D (CALCIUM-VITAMIN D) 500-200 MG-UNIT per tablet Take 1 tablet by mouth daily at 12 noon. Reported on 10/29/2015    . diltiazem (CARDIZEM CD) 180 MG 24 hr capsule Take 1 capsule (180 mg total) by mouth daily.    . fish oil-omega-3 fatty acids 1000 MG capsule Take 1 g by mouth daily.     . hydrochlorothiazide (HYDRODIURIL) 25 MG tablet Take 25 mg by mouth daily.    Marland Kitchen levothyroxine (SYNTHROID, LEVOTHROID) 75 MCG tablet Take 75 mcg by mouth daily.      . Multiple Vitamin (MULTIVITAMIN) tablet Take 0.5 tablets by mouth 2 (two)  times daily.     . Probiotic Product (PROBIOTIC PO) Take 1 tablet by mouth daily.     . ramipril (ALTACE) 10 MG tablet Take 10 mg by mouth daily.      . vitamin C (ASCORBIC ACID) 500 MG tablet Take 500 mg by mouth daily.     Marland Kitchen warfarin (COUMADIN) 1 MG tablet as directed.    . warfarin (COUMADIN) 3 MG tablet Take as directed (Also has 1 mg tablet)     No current facility-administered medications for this visit.     Allergies:   Pneumovax 23 [pneumococcal vac polyvalent]; Codeine; Cephalexin; Sudafed [pseudoephedrine hcl]; and Sulfa antibiotics   Social History:  The patient  reports that she has never smoked. She has never used smokeless tobacco. She reports that she does not drink alcohol or use drugs.   Family  History:  The patient's  family history includes Diabetes in her father; Heart attack in her mother and sister; Heart disease in her brother, mother, and sister; Heart failure in her brother; Hypertension in her father and sister; Stroke in her father; Sudden death in her maternal grandfather.    ROS:  Please see the history of present illness.   All other systems are reviewed and negative.    PHYSICAL EXAM: VS:  BP 126/80   Pulse 73   Ht 5\' 5"  (1.651 m)   Wt 175 lb 9.6 oz (79.7 kg)   BMI 29.22 kg/m  , BMI Body mass index is 29.22 kg/m. GEN: Well nourished, well developed, in no acute distress  HEENT: normal  Neck: no JVD, carotid bruits, or masses Cardiac: RRR; no murmurs, rubs, or gallops,no edema  Respiratory:  clear to auscultation bilaterally, normal work of breathing GI: soft, nontender, nondistended, + BS MS: no deformity or atrophy  Skin: warm and dry  Neuro:  Strength and sensation are intact Psych: euthymic mood, full affect  EKG:  EKG is ordered today. The ekg ordered today shows sinus rhythm 73 bpm, PR 196 msec, QRS 86, Qtc 427 msec, LAHB  Recent Labs: 11/14/2015: BUN 16; Creat 0.93; Hemoglobin 14.1; Platelets 221; Potassium 4.1; Sodium 140    Lipid Panel  No results found for: CHOL, TRIG, HDL, CHOLHDL, VLDL, LDLCALC, LDLDIRECT   Wt Readings from Last 3 Encounters:  06/03/16 175 lb 9.6 oz (79.7 kg)  03/04/16 175 lb 3.2 oz (79.5 kg)  02/05/16 177 lb 1.9 oz (80.3 kg)     ASSESSMENT AND PLAN:  1.  Paroxysmal atrial fibrillation Doing well s/p ablation off AAD therapy She has tachy palpitations of unclear etiology.   These occur mostly with hot flashes.  Likely sinus tach, however I cannot exclude an arrhythmia. I have therefore advised ILR placement for monitoring of palpitatons post AF ablation.  Risks of procedure discussed with the patient who wishes to proceed.  chads2vasc score is at least 4.  She is on coumadin and declines NOAC therapy due to cost  concerns.  2. HTN Stable No change required today  Return to see me in 3 months  Current medicines are reviewed at length with the patient today.   The patient does not have concerns regarding her medicines.  The following changes were made today:  none   Signed, [name], MD  06/03/2016 11:14 AM     [facility] [address] [place] [address] [phone] (office) [phone] (fax)

## 2016-06-05 ENCOUNTER — Encounter (HOSPITAL_COMMUNITY): Payer: Self-pay | Admitting: *Deleted

## 2016-06-05 ENCOUNTER — Telehealth: Payer: Self-pay | Admitting: Internal Medicine

## 2016-06-05 NOTE — Telephone Encounter (Signed)
PT  CALLING TO  MAKE  SURE  LOOP IMPLANT  IS  COVERED BY INSURANCE  LM  FOR  BILLING  TO   CALL  BACK    TO FIND  OUT  IF   PROCEDURE IS  COVERED .Adonis Housekeeper

## 2016-06-05 NOTE — Telephone Encounter (Signed)
°  New Prob  Pt requesting to speak to nurse regarding upcoming procedure. Please call.

## 2016-06-08 ENCOUNTER — Encounter (HOSPITAL_COMMUNITY)
Admission: RE | Disposition: A | Discharge status: Home / Self Care | Payer: Self-pay | Source: Ambulatory Visit | Attending: Internal Medicine

## 2016-06-08 ENCOUNTER — Ambulatory Visit (HOSPITAL_COMMUNITY)
Admission: RE | Admit: 2016-06-08 | Discharge: 2016-06-08 | Disposition: A | Discharge status: Home / Self Care | Payer: Medicare Other | Source: Ambulatory Visit | Attending: Internal Medicine | Admitting: Internal Medicine

## 2016-06-08 ENCOUNTER — Encounter (HOSPITAL_COMMUNITY): Payer: Self-pay | Admitting: Internal Medicine

## 2016-06-08 DIAGNOSIS — R002 Palpitations: Secondary | ICD-10-CM | POA: Insufficient documentation

## 2016-06-08 DIAGNOSIS — M1990 Unspecified osteoarthritis, unspecified site: Secondary | ICD-10-CM | POA: Diagnosis not present

## 2016-06-08 DIAGNOSIS — Z8249 Family history of ischemic heart disease and other diseases of the circulatory system: Secondary | ICD-10-CM | POA: Insufficient documentation

## 2016-06-08 DIAGNOSIS — N189 Chronic kidney disease, unspecified: Secondary | ICD-10-CM | POA: Insufficient documentation

## 2016-06-08 DIAGNOSIS — E039 Hypothyroidism, unspecified: Secondary | ICD-10-CM | POA: Diagnosis not present

## 2016-06-08 DIAGNOSIS — Z7901 Long term (current) use of anticoagulants: Secondary | ICD-10-CM | POA: Insufficient documentation

## 2016-06-08 DIAGNOSIS — Z833 Family history of diabetes mellitus: Secondary | ICD-10-CM | POA: Diagnosis not present

## 2016-06-08 DIAGNOSIS — I129 Hypertensive chronic kidney disease with stage 1 through stage 4 chronic kidney disease, or unspecified chronic kidney disease: Secondary | ICD-10-CM | POA: Insufficient documentation

## 2016-06-08 DIAGNOSIS — I739 Peripheral vascular disease, unspecified: Secondary | ICD-10-CM | POA: Insufficient documentation

## 2016-06-08 DIAGNOSIS — Z823 Family history of stroke: Secondary | ICD-10-CM | POA: Insufficient documentation

## 2016-06-08 HISTORY — PX: EP IMPLANTABLE DEVICE: SHX172B

## 2016-06-08 SURGERY — LOOP RECORDER INSERTION
Anesthesia: LOCAL

## 2016-06-08 MED ORDER — LIDOCAINE-EPINEPHRINE 1 %-1:100000 IJ SOLN
INTRAMUSCULAR | Status: DC | PRN
  Administered 2016-06-08: 20 mL

## 2016-06-08 MED ORDER — LIDOCAINE-EPINEPHRINE 1 %-1:100000 IJ SOLN
INTRAMUSCULAR | Status: AC
  Filled 2016-06-08: qty 1

## 2016-06-08 SURGICAL SUPPLY — 2 items
LOOP REVEAL LINQSYS (Prosthesis & Implant Heart) ×1 IMPLANT
PACK LOOP INSERTION (CUSTOM PROCEDURE TRAY) ×2 IMPLANT

## 2016-06-08 NOTE — Interval H&P Note (Signed)
History and Physical Interval Note:  06/08/2016 8:25 AM  Frances [name]  has presented today for surgery, with the diagnosis of afib  The various methods of treatment have been discussed with the patient and family. After consideration of risks, benefits and other options for treatment, the patient has consented to  Procedure(s): Loop Recorder Insertion (N/A) as a surgical intervention .  The patient's history has been reviewed, patient examined, no change in status, stable for surgery.  I have reviewed the patient's chart and labs.  Questions were answered to the patient's satisfaction.     [name]

## 2016-06-08 NOTE — Progress Notes (Signed)
Loop recorder placed per Dr Rayann Heman.  Supplemental Discharge instructions following loop recorder insertion wound care instructions given to pt and husband and explained by Dr Rayann Heman.  Pt allowed to shower after 24 hours.  Has a follow-up appointment on Oct 12.  Pt able to verbalized instructions with good understanding. Site clean, dry and intact at discharge.

## 2016-06-08 NOTE — Discharge Instructions (Signed)
Patient may remove tegaderm in 24 hours OK to shower in 24 hours.

## 2016-06-08 NOTE — H&P (View-Only) (Signed)
Electrophysiology Office Note   Date:  06/03/2016   ID:  Frances [name], DOB 15-Jul-1934, MRN [id number]  PCP:  Mathews Argyle, MD  Cardiologist:  Dr Marlou Porch Primary Electrophysiologist: [name], MD    Chief Complaint  Patient presents with  . Atrial Fibrillation     History of Present Illness: Frances [name] is a 82 y.o. female who presents today for electrophysiology evaluation.   She has done well since her afib ablation.  She has occasional tachy palpitations.  These are noticeable when walking up stairs or when she has a hot flash.  This feels different from her AFib. Today, she denies symptoms of chest pain,  orthopnea, PND, lower extremity edema, claudication, presyncope, syncope, bleeding, or neurologic sequela. The patient is tolerating medications without difficulties and is otherwise without complaint today.    Past Medical History:  Diagnosis Date  . Arthritis    hands, back  . Chronic kidney disease    stage 3 renal disease - no med  . Dysrhythmia    Hx - a-fib 05/2010 - tx with meds, no problem since 05/2010  . Hearing loss    bilateral hearing aids  . Hypertension    controlled with meds  . Hypothyroidism   . Internal hemorrhoid   . Paroxysmal atrial fibrillation (HCC)   . Peripheral vascular disease ([place])    right arm and right shoulder blood clots r/t a fall  . Rectal bleeding   . SVD (spontaneous vaginal delivery) 1957, 1959   x 2  . Thyroid disease    hypothyroidism   Past Surgical History:  Procedure Laterality Date  . ABDOMINAL HYSTERECTOMY     and right ovary  . ANTERIOR AND POSTERIOR REPAIR N/A 01/03/2014   Procedure: ANTERIOR (CYSTOCELE) REPAIR;  Surgeon: Floyce Stakes. Pamala Hurry, MD;  Location: [facility];  Service: Gynecology;  Laterality: N/A;  . APPENDECTOMY    . BACK SURGERY     x 3 - 2 rods and 8 screws  . COLONOSCOPY    . CYSTOCELE REPAIR    . DENTAL SURGERY     infected tooth - general  . DILATION AND CURETTAGE OF UTERUS      x several  . ELECTROPHYSIOLOGIC STUDY N/A 11/28/2015   Procedure: Atrial Fibrillation Ablation;  Surgeon: [name], MD;  Location: [facility];  Service: Cardiovascular;  Laterality: N/A;  . EXAMINATION UNDER ANESTHESIA N/A 01/15/2014   Procedure: EXAM UNDER ANESTHESIA with evacuation of hematoma and over sewing of vaginal mucosa.;  Surgeon: Floyce Stakes. Pamala Hurry, MD;  Location: [facility];  Service: Gynecology;  Laterality: N/A;  . HAND SURGERY     left  . hemorrhoid injection  04/2011  . JOINT REPLACEMENT     left thumb replacement  . TONSILLECTOMY       Current Outpatient Prescriptions  Medication Sig Dispense Refill  . Calcium Carbonate-Vitamin D (CALCIUM-VITAMIN D) 500-200 MG-UNIT per tablet Take 1 tablet by mouth daily at 12 noon. Reported on 10/29/2015    . diltiazem (CARDIZEM CD) 180 MG 24 hr capsule Take 1 capsule (180 mg total) by mouth daily.    . fish oil-omega-3 fatty acids 1000 MG capsule Take 1 g by mouth daily.     . hydrochlorothiazide (HYDRODIURIL) 25 MG tablet Take 25 mg by mouth daily.    Marland Kitchen levothyroxine (SYNTHROID, LEVOTHROID) 75 MCG tablet Take 75 mcg by mouth daily.      . Multiple Vitamin (MULTIVITAMIN) tablet Take 0.5 tablets by mouth 2 (two)  times daily.     . Probiotic Product (PROBIOTIC PO) Take 1 tablet by mouth daily.     . ramipril (ALTACE) 10 MG tablet Take 10 mg by mouth daily.      . vitamin C (ASCORBIC ACID) 500 MG tablet Take 500 mg by mouth daily.     Marland Kitchen warfarin (COUMADIN) 1 MG tablet as directed.    . warfarin (COUMADIN) 3 MG tablet Take as directed (Also has 1 mg tablet)     No current facility-administered medications for this visit.     Allergies:   Pneumovax 23 [pneumococcal vac polyvalent]; Codeine; Cephalexin; Sudafed [pseudoephedrine hcl]; and Sulfa antibiotics   Social History:  The patient  reports that she has never smoked. She has never used smokeless tobacco. She reports that she does not drink alcohol or use drugs.   Family  History:  The patient's  family history includes Diabetes in her father; Heart attack in her mother and sister; Heart disease in her brother, mother, and sister; Heart failure in her brother; Hypertension in her father and sister; Stroke in her father; Sudden death in her maternal grandfather.    ROS:  Please see the history of present illness.   All other systems are reviewed and negative.    PHYSICAL EXAM: VS:  BP 126/80   Pulse 73   Ht 5\' 5"  (1.651 m)   Wt 175 lb 9.6 oz (79.7 kg)   BMI 29.22 kg/m  , BMI Body mass index is 29.22 kg/m. GEN: Well nourished, well developed, in no acute distress  HEENT: normal  Neck: no JVD, carotid bruits, or masses Cardiac: RRR; no murmurs, rubs, or gallops,no edema  Respiratory:  clear to auscultation bilaterally, normal work of breathing GI: soft, nontender, nondistended, + BS MS: no deformity or atrophy  Skin: warm and dry  Neuro:  Strength and sensation are intact Psych: euthymic mood, full affect  EKG:  EKG is ordered today. The ekg ordered today shows sinus rhythm 73 bpm, PR 196 msec, QRS 86, Qtc 427 msec, LAHB  Recent Labs: 11/14/2015: BUN 16; Creat 0.93; Hemoglobin 14.1; Platelets 221; Potassium 4.1; Sodium 140    Lipid Panel  No results found for: CHOL, TRIG, HDL, CHOLHDL, VLDL, LDLCALC, LDLDIRECT   Wt Readings from Last 3 Encounters:  06/03/16 175 lb 9.6 oz (79.7 kg)  03/04/16 175 lb 3.2 oz (79.5 kg)  02/05/16 177 lb 1.9 oz (80.3 kg)     ASSESSMENT AND PLAN:  1.  Paroxysmal atrial fibrillation Doing well s/p ablation off AAD therapy She has tachy palpitations of unclear etiology.   These occur mostly with hot flashes.  Likely sinus tach, however I cannot exclude an arrhythmia. I have therefore advised ILR placement for monitoring of palpitatons post AF ablation.  Risks of procedure discussed with the patient who wishes to proceed.  chads2vasc score is at least 4.  She is on coumadin and declines NOAC therapy due to cost  concerns.  2. HTN Stable No change required today  Return to see me in 3 months  Current medicines are reviewed at length with the patient today.   The patient does not have concerns regarding her medicines.  The following changes were made today:  none   Signed, [name], MD  06/03/2016 11:14 AM     [facility] [address] [place] [address] [phone] (office) [phone] (fax)

## 2016-06-12 ENCOUNTER — Ambulatory Visit: Payer: Medicare Other | Admitting: *Deleted

## 2016-06-12 ENCOUNTER — Telehealth: Payer: Self-pay | Admitting: Internal Medicine

## 2016-06-12 ENCOUNTER — Other Ambulatory Visit: Payer: Self-pay | Admitting: Student

## 2016-06-12 DIAGNOSIS — Z95818 Presence of other cardiac implants and grafts: Secondary | ICD-10-CM

## 2016-06-12 LAB — CUP PACEART INCLINIC DEVICE CHECK: MDC IDC SESS DTM: 20171006150903

## 2016-06-12 MED ORDER — CLINDAMYCIN HCL 150 MG PO CAPS: 300.0000 mg | ORAL_CAPSULE | Freq: Two times a day (BID) | ORAL | Status: DC

## 2016-06-12 MED ORDER — CLINDAMYCIN HCL 300 MG PO CAPS: 300.0000 mg | ORAL_CAPSULE | Freq: Two times a day (BID) | ORAL | 0 refills | Status: DC

## 2016-06-12 NOTE — Patient Instructions (Addendum)
Hold coumadin until office visit with Dr. Rayann Heman on 10/11.  Maintain dressing over incision. Change twice daily or if dressing becomes saturated. Do not apply creams, lotions, powders or ointments to incision and surrounding site.   Begin antibiotic on evening of 10/6.

## 2016-06-12 NOTE — Telephone Encounter (Signed)
New Message  Pt call requesting to speak with RN. Pt states her wound area is sore. She states its the size of a small tomato. Pt states the wound area is swelling around the area of her breast. Please call back to discuss

## 2016-06-12 NOTE — Progress Notes (Signed)
Wound check appointment--loop recorder. Steri-strips removed.  Significant bruising inferior to incision, soft to palpation but tender. Erythema noted surrounding the insertion site, hot to the touch. Additionally small blisters present on right lateral incision with serous drainage. Incision edges approximated with a small amount of serous drainage expressed. GT assessed. Clindamyacin ordered, coumadin held till [place] with JA on 10/11. Wound care instructions given.

## 2016-06-12 NOTE — Telephone Encounter (Signed)
Spoke w/ pt and she has some concerns about her wound site post loop recorder implant. Red, swollen, oozing, and bruising growing into her breast area. Call routed to device tech RN.

## 2016-06-17 ENCOUNTER — Ambulatory Visit (INDEPENDENT_AMBULATORY_CARE_PROVIDER_SITE_OTHER): Payer: Medicare Other | Admitting: Internal Medicine

## 2016-06-17 ENCOUNTER — Encounter: Payer: Self-pay | Admitting: Internal Medicine

## 2016-06-17 VITALS — BP 126/78 | HR 76 | Ht 65.0 in | Wt 175.6 lb

## 2016-06-17 DIAGNOSIS — I480 Paroxysmal atrial fibrillation: Secondary | ICD-10-CM | POA: Diagnosis not present

## 2016-06-17 DIAGNOSIS — I10 Essential (primary) hypertension: Secondary | ICD-10-CM | POA: Diagnosis not present

## 2016-06-17 NOTE — Patient Instructions (Signed)
Medication Instructions:  Your physician recommends that you continue on your current medications as directed. Please refer to the Current Medication list given to you today.   Labwork: None ordered   Testing/Procedures: None ordered   Follow-Up: Your physician recommends that you schedule a follow-up appointment in: 4 weeks with Dr Allred   Any Other Special Instructions Will Be Listed Below (If Applicable).     If you need a refill on your cardiac medications before your next appointment, please call your pharmacy.   

## 2016-06-18 ENCOUNTER — Ambulatory Visit: Payer: Medicare Other

## 2016-06-18 NOTE — Progress Notes (Signed)
Electrophysiology Office Note   Date:  06/18/2016   ID:  BENTLEY CERAVOLO, DOB [birth year], MRN [id number]  PCP:  Mathews Argyle, MD  Cardiologist:  Dr Marlou Porch Primary Electrophysiologist: [name], MD    Chief Complaint  Patient presents with  . Atrial Fibrillation     History of Present Illness: Frances [name] is a 82 y.o. female who presents today for electrophysiology evaluation.   She has had some palpitations post ablation however her ILR reveals only sinus rhythm.  No afib has been detected. She has had a rash over the ILR implant site from the tegaderm dressing. This appears to be healing.  There is no drainage or suggestion of infection at this time.  Today, she denies symptoms of chest pain,  orthopnea, PND, lower extremity edema, claudication, presyncope, syncope, bleeding, or neurologic sequela. The patient is tolerating medications without difficulties and is otherwise without complaint today.    Past Medical History:  Diagnosis Date  . Arthritis    hands, back  . Chronic kidney disease    stage 3 renal disease - no med  . Dysrhythmia    Hx - a-fib 05/2010 - tx with meds, no problem since 05/2010  . Hearing loss    bilateral hearing aids  . Hypertension    controlled with meds  . Hypothyroidism   . Internal hemorrhoid   . Paroxysmal atrial fibrillation (HCC)   . Peripheral vascular disease ([place])    right arm and right shoulder blood clots r/t a fall  . Rectal bleeding   . SVD (spontaneous vaginal delivery) 1957, 1959   x 2  . Thyroid disease    hypothyroidism   Past Surgical History:  Procedure Laterality Date  . ABDOMINAL HYSTERECTOMY     and right ovary  . ANTERIOR AND POSTERIOR REPAIR N/A 01/03/2014   Procedure: ANTERIOR (CYSTOCELE) REPAIR;  Surgeon: Floyce Stakes. Pamala Hurry, MD;  Location: [facility];  Service: Gynecology;  Laterality: N/A;  . APPENDECTOMY    . BACK SURGERY     x 3 - 2 rods and 8 screws  . COLONOSCOPY    . CYSTOCELE REPAIR      . DENTAL SURGERY     infected tooth - general  . DILATION AND CURETTAGE OF UTERUS     x several  . ELECTROPHYSIOLOGIC STUDY N/A 11/28/2015   Procedure: Atrial Fibrillation Ablation;  Surgeon: [name], MD;  Location: [facility];  Service: Cardiovascular;  Laterality: N/A;  . EP IMPLANTABLE DEVICE N/A 06/08/2016   Procedure: Loop Recorder Insertion;  Surgeon: [name], MD;  Location: [facility];  Service: Cardiovascular;  Laterality: N/A;  . EXAMINATION UNDER ANESTHESIA N/A 01/15/2014   Procedure: EXAM UNDER ANESTHESIA with evacuation of hematoma and over sewing of vaginal mucosa.;  Surgeon: Floyce Stakes. Pamala Hurry, MD;  Location: [facility];  Service: Gynecology;  Laterality: N/A;  . HAND SURGERY     left  . hemorrhoid injection  04/2011  . JOINT REPLACEMENT     left thumb replacement  . TONSILLECTOMY       Current Outpatient Prescriptions  Medication Sig Dispense Refill  . Calcium Carbonate-Vitamin D (CALCIUM-VITAMIN D) 500-200 MG-UNIT per tablet Take 1 tablet by mouth daily at 12 noon. Reported on 10/29/2015    . cholecalciferol (VITAMIN D) 1000 units tablet Take 1,000 Units by mouth daily with lunch.    . clindamycin (CLEOCIN) 300 MG capsule Take 1 capsule (300 mg total) by mouth 2 (two) times daily. [place]  capsule 0  . diltiazem (CARDIZEM CD) 180 MG 24 hr capsule Take 1 capsule (180 mg total) by mouth daily.    . hydrochlorothiazide (HYDRODIURIL) 25 MG tablet Take 25 mg by mouth daily.    Marland Kitchen levothyroxine (SYNTHROID, LEVOTHROID) 75 MCG tablet Take 75 mcg by mouth daily.      . Multiple Vitamin (MULTIVITAMIN) tablet Take 0.5 tablets by mouth 2 (two) times daily.     . Omega-3 Fatty Acids (MINI FISH OIL PO) Take 788 mg by mouth daily with lunch.    . Probiotic Product (PROBIOTIC PO) Take 1 tablet by mouth daily after lunch.     . psyllium (METAMUCIL) 58.6 % powder Take 1 packet by mouth daily.    . ramipril (ALTACE) 10 MG tablet Take 10 mg by mouth daily.      . vitamin C  (ASCORBIC ACID) 500 MG tablet Take 500 mg by mouth daily.     Marland Kitchen warfarin (COUMADIN) 1 MG tablet Take 2 mg by mouth as directed. Current dose is alternating 3 mg-3mg -2mg  cyclically    . warfarin (COUMADIN) 3 MG tablet Take 3 mg by mouth as directed. Current dose is alternating 3 mg-3mg -2mg  cyclically     No current facility-administered medications for this visit.     Allergies:   Pneumovax 23 [pneumococcal vac polyvalent]; Codeine; Cephalexin; Sudafed [pseudoephedrine hcl]; and Sulfa antibiotics   Social History:  The patient  reports that she has never smoked. She has never used smokeless tobacco. She reports that she does not drink alcohol or use drugs.   Family History:  The patient's  family history includes Diabetes in her father; Heart attack in her mother and sister; Heart disease in her brother, mother, and sister; Heart failure in her brother; Hypertension in her father and sister; Stroke in her father; Sudden death in her maternal grandfather.    ROS:  Please see the history of present illness.   All other systems are reviewed and negative.    PHYSICAL EXAM: VS:  BP 126/78   Pulse 76   Ht 5\' 5"  (1.651 m)   Wt 175 lb 9.6 oz (79.7 kg)   BMI 29.22 kg/m  , BMI Body mass index is 29.22 kg/m. GEN: Well nourished, well developed, in no acute distress  HEENT: normal  Neck: no JVD, carotid bruits, or masses Cardiac: RRR; no murmurs, rubs, or gallops,no edema  Respiratory:  clear to auscultation bilaterally, normal work of breathing GI: soft, nontender, nondistended, + BS MS: no deformity or atrophy  Skin: macular rash over ILR site noted Neuro:  Strength and sensation are intact Psych: euthymic mood, full affect  EKG:  Not ordered today  ILR transmissions are reviewed.  Symptomatic transmissions are sinus rhythm. No arrhythmias.  No afib.  Recent Labs: 11/14/2015: BUN 16; Creat 0.93; Hemoglobin 14.1; Platelets 221; Potassium 4.1; Sodium 140    Lipid Panel  No results found  for: CHOL, TRIG, HDL, CHOLHDL, VLDL, LDLCALC, LDLDIRECT   Wt Readings from Last 3 Encounters:  06/17/16 175 lb 9.6 oz (79.7 kg)  06/08/16 170 lb (77.1 kg)  06/03/16 175 lb 9.6 oz (79.7 kg)     ASSESSMENT AND PLAN:  1.  Paroxysmal atrial fibrillation Doing well s/p ablation off AAD therapy ILR does not reveal any arrhythmias. She has rash over her ILR site from the tegaderm but no evidence of infection.  I am optimistic that she will heal without difficulty. Dr Lovena Le held her coumadin.  I think that this could  be resumed in 48 hours.  2. HTN Stable No change required today  Return to see me in 4 weeks   Signed, [name], MD   Frances [name] [address] [phone] (office) [phone] (fax)

## 2016-06-19 DIAGNOSIS — Z79899 Other long term (current) drug therapy: Secondary | ICD-10-CM | POA: Diagnosis not present

## 2016-06-19 DIAGNOSIS — N183 Chronic kidney disease, stage 3 (moderate): Secondary | ICD-10-CM | POA: Diagnosis not present

## 2016-06-19 DIAGNOSIS — Z1389 Encounter for screening for other disorder: Secondary | ICD-10-CM | POA: Diagnosis not present

## 2016-06-19 DIAGNOSIS — E669 Obesity, unspecified: Secondary | ICD-10-CM | POA: Diagnosis not present

## 2016-06-19 DIAGNOSIS — I480 Paroxysmal atrial fibrillation: Secondary | ICD-10-CM | POA: Diagnosis not present

## 2016-06-19 DIAGNOSIS — I129 Hypertensive chronic kidney disease with stage 1 through stage 4 chronic kidney disease, or unspecified chronic kidney disease: Secondary | ICD-10-CM | POA: Diagnosis not present

## 2016-06-19 DIAGNOSIS — L231 Allergic contact dermatitis due to adhesives: Secondary | ICD-10-CM | POA: Diagnosis not present

## 2016-06-19 DIAGNOSIS — Z0000 Encounter for general adult medical examination without abnormal findings: Secondary | ICD-10-CM | POA: Diagnosis not present

## 2016-06-19 DIAGNOSIS — Z6830 Body mass index (BMI) 30.0-30.9, adult: Secondary | ICD-10-CM | POA: Diagnosis not present

## 2016-06-23 DIAGNOSIS — Z79899 Other long term (current) drug therapy: Secondary | ICD-10-CM | POA: Diagnosis not present

## 2016-06-23 DIAGNOSIS — I480 Paroxysmal atrial fibrillation: Secondary | ICD-10-CM | POA: Diagnosis not present

## 2016-06-23 DIAGNOSIS — I129 Hypertensive chronic kidney disease with stage 1 through stage 4 chronic kidney disease, or unspecified chronic kidney disease: Secondary | ICD-10-CM | POA: Diagnosis not present

## 2016-06-30 DIAGNOSIS — I480 Paroxysmal atrial fibrillation: Secondary | ICD-10-CM | POA: Diagnosis not present

## 2016-07-07 DIAGNOSIS — I480 Paroxysmal atrial fibrillation: Secondary | ICD-10-CM | POA: Diagnosis not present

## 2016-07-08 ENCOUNTER — Ambulatory Visit (INDEPENDENT_AMBULATORY_CARE_PROVIDER_SITE_OTHER): Payer: Medicare Other | Admitting: *Deleted

## 2016-07-08 DIAGNOSIS — I480 Paroxysmal atrial fibrillation: Secondary | ICD-10-CM

## 2016-07-09 NOTE — Progress Notes (Signed)
Carelink Summary Report / Loop Recorder 

## 2016-07-13 DIAGNOSIS — N390 Urinary tract infection, site not specified: Secondary | ICD-10-CM | POA: Diagnosis not present

## 2016-07-15 DIAGNOSIS — I480 Paroxysmal atrial fibrillation: Secondary | ICD-10-CM | POA: Diagnosis not present

## 2016-07-15 DIAGNOSIS — Z7901 Long term (current) use of anticoagulants: Secondary | ICD-10-CM | POA: Diagnosis not present

## 2016-07-16 ENCOUNTER — Telehealth: Payer: Self-pay | Admitting: *Deleted

## 2016-07-16 NOTE — Telephone Encounter (Signed)
[facility] requesting call back.  [facility] phone number for return call.  Will request that patient send a manual transmission for review when she returns call.

## 2016-07-17 NOTE — Telephone Encounter (Signed)
Spoke with patient, assisted her in sending manual transmission.  Patient reports using her symptom activator during episodes of dizziness--available ECGs all show SR, some with rare ectopy.  Patient notes one AF episode on 11/7, correlates with episode detected by device.  Advised patient to use her symptom activator for presyncopal and syncopal episodes, especially if worsening, and requested that she call the [facility] if she uses her symptom activator so that we can review a manual transmission at that time (rather than waiting on monitor to transmit).  Patient verbalizes understanding.  She is aware of appointment with Dr. Rayann Heman on 07/20/16 and denies additional questions or concerns at this time.

## 2016-07-20 ENCOUNTER — Ambulatory Visit (INDEPENDENT_AMBULATORY_CARE_PROVIDER_SITE_OTHER): Payer: Medicare Other | Admitting: Internal Medicine

## 2016-07-20 ENCOUNTER — Encounter: Payer: Self-pay | Admitting: Internal Medicine

## 2016-07-20 VITALS — BP 136/80 | HR 90 | Ht 65.0 in | Wt 174.2 lb

## 2016-07-20 DIAGNOSIS — I10 Essential (primary) hypertension: Secondary | ICD-10-CM

## 2016-07-20 DIAGNOSIS — I480 Paroxysmal atrial fibrillation: Secondary | ICD-10-CM | POA: Diagnosis not present

## 2016-07-20 MED ORDER — DILTIAZEM HCL ER COATED BEADS 180 MG PO CP24: 180.0000 mg | ORAL_CAPSULE | ORAL | Status: DC | PRN

## 2016-07-20 NOTE — Patient Instructions (Signed)
Medication Instructions:  Your physician has recommended you make the following change in your medication:  1) Take Diltiazem only as needed   Labwork: None ordered   Testing/Procedures: None ordered   Follow-Up: Your physician recommends that you schedule a follow-up appointment in: 3 months with Dr Marlou Porch and 6 months with Dr  Rayann Heman    Any Other Special Instructions Will Be Listed Below (If Applicable).     If you need a refill on your cardiac medications before your next appointment, please call your pharmacy.

## 2016-07-21 DIAGNOSIS — Z7901 Long term (current) use of anticoagulants: Secondary | ICD-10-CM | POA: Diagnosis not present

## 2016-07-22 NOTE — Progress Notes (Signed)
Electrophysiology Office Note   Date:  07/22/2016   ID:  LETONIA NICODEMUS, DOB [birth year], MRN [id number]  PCP:  Mathews Argyle, MD  Cardiologist:  Dr Marlou Porch  Chief Complaint  Patient presents with  . Atrial Fibrillation     History of Present Illness: [name] is a 82 y.o. female who presents today for electrophysiology evaluation.   ILR reveals that the majority of her symptom episodes (51 submitted events) are sinus rhythm/ sinus tachycardia.  Only a single episode (lasting just over an hour) of AF has been detected (07/14/16) since her ILR was implanted.  Today, she denies symptoms of chest pain,  orthopnea, PND, lower extremity edema, claudication, presyncope, syncope, bleeding, or neurologic sequela. The patient is tolerating medications without difficulties and is otherwise without complaint today.    Past Medical History:  Diagnosis Date  . Arthritis    hands, back  . Chronic kidney disease    stage 3 renal disease - no med  . Dysrhythmia    Hx - a-fib 05/2010 - tx with meds, no problem since 05/2010  . Hearing loss    bilateral hearing aids  . Hypertension    controlled with meds  . Hypothyroidism   . Internal hemorrhoid   . Paroxysmal atrial fibrillation (HCC)   . Peripheral vascular disease ([place])    right arm and right shoulder blood clots r/t a fall  . Rectal bleeding   . SVD (spontaneous vaginal delivery) 1957, 1959   x 2  . Thyroid disease    hypothyroidism   Past Surgical History:  Procedure Laterality Date  . ABDOMINAL HYSTERECTOMY     and right ovary  . ANTERIOR AND POSTERIOR REPAIR N/A 01/03/2014   Procedure: ANTERIOR (CYSTOCELE) REPAIR;  Surgeon: Floyce Stakes. Pamala Hurry, MD;  Location: [facility];  Service: Gynecology;  Laterality: N/A;  . APPENDECTOMY    . BACK SURGERY     x 3 - 2 rods and 8 screws  . COLONOSCOPY    . CYSTOCELE REPAIR    . DENTAL SURGERY     infected tooth - general  . DILATION AND CURETTAGE OF UTERUS     x several   . ELECTROPHYSIOLOGIC STUDY N/A 11/28/2015   Procedure: Atrial Fibrillation Ablation;  Surgeon: [name], MD;  Location: [facility];  Service: Cardiovascular;  Laterality: N/A;  . EP IMPLANTABLE DEVICE N/A 06/08/2016   Procedure: Loop Recorder Insertion;  Surgeon: [name], MD;  Location: [facility];  Service: Cardiovascular;  Laterality: N/A;  . EXAMINATION UNDER ANESTHESIA N/A 01/15/2014   Procedure: EXAM UNDER ANESTHESIA with evacuation of hematoma and over sewing of vaginal mucosa.;  Surgeon: Floyce Stakes. Pamala Hurry, MD;  Location: [facility];  Service: Gynecology;  Laterality: N/A;  . HAND SURGERY     left  . hemorrhoid injection  04/2011  . JOINT REPLACEMENT     left thumb replacement  . TONSILLECTOMY       Current Outpatient Prescriptions  Medication Sig Dispense Refill  . Calcium Carbonate-Vitamin D (CALCIUM-VITAMIN D) 500-200 MG-UNIT per tablet Take 1 tablet by mouth daily at 12 noon. Reported on 10/29/2015    . cholecalciferol (VITAMIN D) 1000 units tablet Take 1,000 Units by mouth daily with lunch.    . diltiazem (CARDIZEM CD) 180 MG 24 hr capsule Take 1 capsule (180 mg total) by mouth as needed (palpitations).    . hydrochlorothiazide (HYDRODIURIL) 25 MG tablet Take 25 mg by mouth daily.    Marland Kitchen levothyroxine (  SYNTHROID, LEVOTHROID) 75 MCG tablet Take 75 mcg by mouth daily.      . Multiple Vitamin (MULTIVITAMIN) tablet Take 0.5 tablets by mouth 2 (two) times daily.     . Omega-3 Fatty Acids (MINI FISH OIL PO) Take 788 mg by mouth daily with lunch.    . Probiotic Product (PROBIOTIC PO) Take 1 tablet by mouth daily after lunch.     . psyllium (METAMUCIL) 58.6 % powder Take 1 packet by mouth daily.    . ramipril (ALTACE) 10 MG tablet Take 10 mg by mouth daily.      . vitamin C (ASCORBIC ACID) 500 MG tablet Take 500 mg by mouth daily.     Marland Kitchen warfarin (COUMADIN) 1 MG tablet Take 2 mg by mouth as directed. Current dose is alternating 3 mg-3mg -2mg  cyclically    . warfarin  (COUMADIN) 3 MG tablet Take 3 mg by mouth as directed. Current dose is alternating 3 mg-3mg -2mg  cyclically     No current facility-administered medications for this visit.     Allergies:   Pneumovax 23 [pneumococcal vac polyvalent]; Codeine; Cephalexin; Sudafed [pseudoephedrine hcl]; and Sulfa antibiotics   Social History:  The patient  reports that she has never smoked. She has never used smokeless tobacco. She reports that she does not drink alcohol or use drugs.   Family History:  The patient's  family history includes Diabetes in her father; Heart attack in her mother and sister; Heart disease in her brother, mother, and sister; Heart failure in her brother; Hypertension in her father and sister; Stroke in her father; Sudden death in her maternal grandfather.    ROS:  Please see the history of present illness.   All other systems are reviewed and negative.    PHYSICAL EXAM: VS:  BP 136/80   Pulse 90   Ht 5\' 5"  (1.651 m)   Wt 174 lb 3.2 oz (79 kg)   BMI 28.99 kg/m  , BMI Body mass index is 28.99 kg/m. GEN: Well nourished, well developed, in no acute distress  HEENT: normal  Neck: no JVD, carotid bruits, or masses Cardiac: RRR; no murmurs, rubs, or gallops,no edema  Respiratory:  clear to auscultation bilaterally, normal work of breathing GI: soft, nontender, nondistended, + BS MS: no deformity or atrophy  Skin: macular rash over ILR site noted Neuro:  Strength and sensation are intact Psych: euthymic mood, full affect   ILR transmissions are reviewed.  Symptomatic transmissions are sinus rhythm. No arrhythmias.  Single episode of AF 07/14/16  Recent Labs: 11/14/2015: BUN 16; Creat 0.93; Hemoglobin 14.1; Platelets 221; Potassium 4.1; Sodium 140    Lipid Panel  No results found for: CHOL, TRIG, HDL, CHOLHDL, VLDL, LDLCALC, LDLDIRECT   Wt Readings from Last 3 Encounters:  07/20/16 174 lb 3.2 oz (79 kg)  06/17/16 175 lb 9.6 oz (79.7 kg)  06/08/16 170 lb (77.1 kg)      ASSESSMENT AND PLAN:  1.  Paroxysmal atrial fibrillation Doing well s/p ablation off AAD therapy with only a single episode of AF detected. ILR does not reveal any arrhythmias. Continue coumadin No changes  2. HTN Stable No change required today  carelink Follow-up with Dr Marlou Porch in 3 months I will see in 6 months   Signed, [name], MD   [place] [address] [phone] (office) [phone] (fax)

## 2016-07-24 DIAGNOSIS — Z7901 Long term (current) use of anticoagulants: Secondary | ICD-10-CM | POA: Diagnosis not present

## 2016-07-27 ENCOUNTER — Ambulatory Visit: Payer: Medicare Other | Admitting: Cardiology

## 2016-07-29 DIAGNOSIS — Z7901 Long term (current) use of anticoagulants: Secondary | ICD-10-CM | POA: Diagnosis not present

## 2016-08-04 DIAGNOSIS — Z7901 Long term (current) use of anticoagulants: Secondary | ICD-10-CM | POA: Diagnosis not present

## 2016-08-07 ENCOUNTER — Ambulatory Visit (INDEPENDENT_AMBULATORY_CARE_PROVIDER_SITE_OTHER): Payer: Medicare Other | Admitting: *Deleted

## 2016-08-07 DIAGNOSIS — I480 Paroxysmal atrial fibrillation: Secondary | ICD-10-CM

## 2016-08-07 NOTE — Progress Notes (Signed)
Carelink Summary Report / Loop Recorder 

## 2016-08-10 ENCOUNTER — Telehealth: Payer: Self-pay | Admitting: Cardiology

## 2016-08-10 ENCOUNTER — Other Ambulatory Visit: Payer: Self-pay

## 2016-08-10 MED ORDER — DILTIAZEM HCL ER COATED BEADS 180 MG PO CP24: 360.0000 mg | ORAL_CAPSULE | Freq: Every day | ORAL | Status: DC

## 2016-08-10 NOTE — Telephone Encounter (Signed)
Called pt and requested that she send a remote transmission. Helped pt with this task. Pt stated that she had to take an extra Diltiazem. She stated that she was not short of breath. Just really fatigued.

## 2016-08-10 NOTE — Telephone Encounter (Signed)
Pt states she had an episode yesterday and would like to know what that recording was. She was very lightheaded and she took an extra Dilt/xr

## 2016-08-10 NOTE — Telephone Encounter (Signed)
Called pt back and let her know that Dr. Rayann Heman recommended increasing her dosage of Cardizem to 360mg  (Cardizem CD)  once a day. Also pt to f/u with AF clinic tomorrow at 11:00. Pt voiced understanding.

## 2016-08-11 ENCOUNTER — Ambulatory Visit (HOSPITAL_COMMUNITY)
Admission: RE | Admit: 2016-08-11 | Discharge: 2016-08-11 | Disposition: A | Discharge status: Home / Self Care | Payer: Medicare Other | Source: Ambulatory Visit | Attending: Nurse Practitioner | Admitting: Nurse Practitioner

## 2016-08-11 VITALS — BP 144/82 | HR 79 | Ht 65.0 in | Wt 177.0 lb

## 2016-08-11 DIAGNOSIS — E039 Hypothyroidism, unspecified: Secondary | ICD-10-CM | POA: Insufficient documentation

## 2016-08-11 DIAGNOSIS — I129 Hypertensive chronic kidney disease with stage 1 through stage 4 chronic kidney disease, or unspecified chronic kidney disease: Secondary | ICD-10-CM | POA: Insufficient documentation

## 2016-08-11 DIAGNOSIS — Z888 Allergy status to other drugs, medicaments and biological substances status: Secondary | ICD-10-CM | POA: Diagnosis not present

## 2016-08-11 DIAGNOSIS — Z882 Allergy status to sulfonamides status: Secondary | ICD-10-CM | POA: Insufficient documentation

## 2016-08-11 DIAGNOSIS — Z8249 Family history of ischemic heart disease and other diseases of the circulatory system: Secondary | ICD-10-CM | POA: Diagnosis not present

## 2016-08-11 DIAGNOSIS — I480 Paroxysmal atrial fibrillation: Secondary | ICD-10-CM | POA: Diagnosis not present

## 2016-08-11 DIAGNOSIS — Z833 Family history of diabetes mellitus: Secondary | ICD-10-CM | POA: Insufficient documentation

## 2016-08-11 DIAGNOSIS — I739 Peripheral vascular disease, unspecified: Secondary | ICD-10-CM | POA: Insufficient documentation

## 2016-08-11 DIAGNOSIS — Z90710 Acquired absence of both cervix and uterus: Secondary | ICD-10-CM | POA: Diagnosis not present

## 2016-08-11 DIAGNOSIS — Z887 Allergy status to serum and vaccine status: Secondary | ICD-10-CM | POA: Diagnosis not present

## 2016-08-11 DIAGNOSIS — Z823 Family history of stroke: Secondary | ICD-10-CM | POA: Insufficient documentation

## 2016-08-11 DIAGNOSIS — Z7901 Long term (current) use of anticoagulants: Secondary | ICD-10-CM | POA: Insufficient documentation

## 2016-08-11 DIAGNOSIS — Z96692 Finger-joint replacement of left hand: Secondary | ICD-10-CM | POA: Insufficient documentation

## 2016-08-11 DIAGNOSIS — Z881 Allergy status to other antibiotic agents status: Secondary | ICD-10-CM | POA: Insufficient documentation

## 2016-08-11 DIAGNOSIS — Z885 Allergy status to narcotic agent status: Secondary | ICD-10-CM | POA: Diagnosis not present

## 2016-08-11 DIAGNOSIS — N189 Chronic kidney disease, unspecified: Secondary | ICD-10-CM | POA: Insufficient documentation

## 2016-08-11 DIAGNOSIS — E079 Disorder of thyroid, unspecified: Secondary | ICD-10-CM | POA: Insufficient documentation

## 2016-08-11 MED ORDER — DILTIAZEM HCL ER COATED BEADS 180 MG PO CP24: 180.0000 mg | ORAL_CAPSULE | Freq: Every day | ORAL | Status: DC

## 2016-08-11 MED ORDER — DILTIAZEM HCL 30 MG PO TABS: ORAL_TABLET | ORAL | 3 refills | Status: DC

## 2016-08-11 NOTE — Progress Notes (Signed)
Patient ID: Frances [name], female   DOB: 02/15/1934, 82 y.o.   MRN: [id number]     Primary Care Physician: Mathews Argyle, MD Referring Physician: Dr. Marva Panda is a 82 y.o. female with a h/o afib that had afib ablation 11/28/15.  She had a breakthrough episode of  afib 12/3 that lasted 3 hours. She called the office and sent a report and she was advised to increase cardizem to 360 mg a day. She was afraid this may be too much drug. However, her afib burden is low and we discussed just using pill in pocket Cardizem which she is in favor of. Last episode of afib prior this one was months ago, and overall afib burden by last remote check is 0.4% since device implanted.  Today, she denies orthopnea, PND, lower extremity edema, dizziness, presyncope, syncope, or neurologic sequela.Positive for shortness of breath chest tightness with activities, relieved with rest. The patient is tolerating medications without difficulties and is otherwise without complaint today.   Past Medical History:  Diagnosis Date  . Arthritis    hands, back  . Chronic kidney disease    stage 3 renal disease - no med  . Dysrhythmia    Hx - a-fib 05/2010 - tx with meds, no problem since 05/2010  . Hearing loss    bilateral hearing aids  . Hypertension    controlled with meds  . Hypothyroidism   . Internal hemorrhoid   . Paroxysmal atrial fibrillation (HCC)   . Peripheral vascular disease ([place])    right arm and right shoulder blood clots r/t a fall  . Rectal bleeding   . SVD (spontaneous vaginal delivery) 1957, 1959   x 2  . Thyroid disease    hypothyroidism   Past Surgical History:  Procedure Laterality Date  . ABDOMINAL HYSTERECTOMY     and right ovary  . ANTERIOR AND POSTERIOR REPAIR N/A 01/03/2014   Procedure: ANTERIOR (CYSTOCELE) REPAIR;  Surgeon: Floyce Stakes. Pamala Hurry, MD;  Location: [facility];  Service: Gynecology;  Laterality: N/A;  . APPENDECTOMY    . BACK SURGERY     x 3 - 2 rods  and 8 screws  . COLONOSCOPY    . CYSTOCELE REPAIR    . DENTAL SURGERY     infected tooth - general  . DILATION AND CURETTAGE OF UTERUS     x several  . ELECTROPHYSIOLOGIC STUDY N/A 11/28/2015   Procedure: Atrial Fibrillation Ablation;  Surgeon: [name], MD;  Location: [facility];  Service: Cardiovascular;  Laterality: N/A;  . EP IMPLANTABLE DEVICE N/A 06/08/2016   Procedure: Loop Recorder Insertion;  Surgeon: [name], MD;  Location: [facility];  Service: Cardiovascular;  Laterality: N/A;  . EXAMINATION UNDER ANESTHESIA N/A 01/15/2014   Procedure: EXAM UNDER ANESTHESIA with evacuation of hematoma and over sewing of vaginal mucosa.;  Surgeon: Floyce Stakes. Pamala Hurry, MD;  Location: [facility];  Service: Gynecology;  Laterality: N/A;  . HAND SURGERY     left  . hemorrhoid injection  04/2011  . JOINT REPLACEMENT     left thumb replacement  . TONSILLECTOMY      Current Outpatient Prescriptions  Medication Sig Dispense Refill  . Calcium Carbonate-Vitamin D (CALCIUM-VITAMIN D) 500-200 MG-UNIT per tablet Take 1 tablet by mouth daily at 12 noon. Reported on 10/29/2015    . cholecalciferol (VITAMIN D) 1000 units tablet Take 1,000 Units by mouth daily with lunch.    . diltiazem (CARDIZEM CD) 180  MG 24 hr capsule Take 1 capsule (180 mg total) by mouth daily.    . hydrochlorothiazide (HYDRODIURIL) 25 MG tablet Take 25 mg by mouth daily.    Marland Kitchen levothyroxine (SYNTHROID, LEVOTHROID) 75 MCG tablet Take 75 mcg by mouth daily.      . Multiple Vitamin (MULTIVITAMIN) tablet Take 0.5 tablets by mouth 2 (two) times daily.     . Omega-3 Fatty Acids (MINI FISH OIL PO) Take 788 mg by mouth daily with lunch.    . Probiotic Product (PROBIOTIC PO) Take 1 tablet by mouth daily after lunch.     . psyllium (METAMUCIL) 58.6 % powder Take 1 packet by mouth daily.    . ramipril (ALTACE) 10 MG tablet Take 10 mg by mouth daily.      . vitamin C (ASCORBIC ACID) 500 MG tablet Take 500 mg by mouth daily.     Marland Kitchen  warfarin (COUMADIN) 1 MG tablet Take 2 mg by mouth as directed. Current dose is alternating 3 mg-3mg -2mg  cyclically    . warfarin (COUMADIN) 3 MG tablet Take 3 mg by mouth as directed. Current dose is alternating 3 mg-3mg -2mg  cyclically    . diltiazem (CARDIZEM) 30 MG tablet Take 1 tablet every 4 hours AS NEEDED for fast heart rate >100 as long as blood pressure >100. 45 tablet 3   No current facility-administered medications for this encounter.     Allergies  Allergen Reactions  . Pneumovax 23 [Pneumococcal Vac Polyvalent] Other (See Comments)    Arm swelling & fever  . Codeine Nausea And Vomiting  . Cephalexin Anxiety  . Sudafed [Pseudoephedrine Hcl] Anxiety  . Sulfa Antibiotics Rash    Social History   Social History  . Marital status: Married    Spouse name: N/A  . Number of children: N/A  . Years of education: N/A   Occupational History  . Not on file.   Social History Main Topics  . Smoking status: Never Smoker  . Smokeless tobacco: Never Used  . Alcohol use No  . Drug use: No  . Sexual activity: Not Currently    Birth control/ protection: Post-menopausal   Other Topics Concern  . Not on file   Social History Narrative  . No narrative on file    Family History  Problem Relation Age of Onset  . Heart disease Mother   . Heart attack Mother   . Hypertension Father   . Diabetes Father   . Stroke Father   . Heart disease Sister   . Hypertension Sister   . Heart disease Brother   . Sudden death Maternal Grandfather   . Heart attack Sister   . Heart failure Brother     ROS- All systems are reviewed and negative except as per the HPI above  Physical Exam: Vitals:   08/11/16 1105  BP: (!) 144/82  Pulse: 79  Weight: 177 lb (80.3 kg)  Height: 5\' 5"  (1.651 m)    GEN- The patient is well appearing, alert and oriented x 3 today.   Head- normocephalic, atraumatic Eyes-  Sclera clear, conjunctiva pink Ears- hearing intact Oropharynx- clear Neck- supple,  no JVP Lymph- no cervical lymphadenopathy Lungs- Clear to ausculation bilaterally, normal work of breathing Heart- Regular rate and rhythm, no murmurs, rubs or gallops, PMI not laterally displaced GI- soft, NT, ND, + BS Extremities- no clubbing, cyanosis, or edema MS- no significant deformity or atrophy Skin- no rash or lesion Psych- euthymic mood, full affect Neuro- strength and sensation are intact  EKG- SR,pr int 196 ms, qrs int 84 ms, qtc 433 ms LinQ report reviewed Epic records reviewed  Assessment and Plan: 1. Paroxysmal afib s/p ablation  Recent 3 hour episode  of afib but overall afib burden is low.  Continue cardizem at 180 mg a day and rx for 30 mg Cardizem tab to use every 4 hours as needed for fast heart rate epiosdes Continue warfarin  F/u  1/2 with Dr. Marlou Porch 2/20 and remote pacer check 1/2  Geroge Baseman. Tameika Heckmann, [facility] [address] [place], [place] [address] [phone]

## 2016-08-11 NOTE — Patient Instructions (Signed)
Your physician has recommended you make the following change in your medication:  1)Cardizem 180mg  once a day 2)Cardizem 30mg  -- take 1 tablet every 4 hours AS NEEDED for Afib heart rate >100 as long as blood pressure >100.

## 2016-08-15 LAB — CUP PACEART REMOTE DEVICE CHECK
Date Time Interrogation Session: 20171101113556
Implantable Pulse Generator Implant Date: 20171002

## 2016-08-15 NOTE — Progress Notes (Signed)
Carelink summary report received. Battery status OK. Normal device function. No new tachy episodes, brady, or pause episodes. No new AF episodes. 26 symptom episodes- 13 w/ available ECGs, appear SR/ST. Monthly summary reports and ROV/PRN

## 2016-08-18 DIAGNOSIS — Z7901 Long term (current) use of anticoagulants: Secondary | ICD-10-CM | POA: Diagnosis not present

## 2016-08-21 ENCOUNTER — Other Ambulatory Visit: Payer: Self-pay | Admitting: Internal Medicine

## 2016-08-27 ENCOUNTER — Telehealth: Payer: Self-pay | Admitting: Cardiology

## 2016-08-27 NOTE — Telephone Encounter (Signed)
Spoke w/ pt husband requesting that pt send a remote transmission using her home monitor b/c her home monitor has not updated in the last 14 days. Pt husband verbalized understanding.

## 2016-09-01 DIAGNOSIS — Z7901 Long term (current) use of anticoagulants: Secondary | ICD-10-CM | POA: Diagnosis not present

## 2016-09-02 ENCOUNTER — Other Ambulatory Visit: Payer: Self-pay | Admitting: Internal Medicine

## 2016-09-04 DIAGNOSIS — I129 Hypertensive chronic kidney disease with stage 1 through stage 4 chronic kidney disease, or unspecified chronic kidney disease: Secondary | ICD-10-CM | POA: Diagnosis not present

## 2016-09-04 DIAGNOSIS — N183 Chronic kidney disease, stage 3 (moderate): Secondary | ICD-10-CM | POA: Diagnosis not present

## 2016-09-04 DIAGNOSIS — I480 Paroxysmal atrial fibrillation: Secondary | ICD-10-CM | POA: Diagnosis not present

## 2016-09-08 ENCOUNTER — Ambulatory Visit (INDEPENDENT_AMBULATORY_CARE_PROVIDER_SITE_OTHER): Payer: Medicare Other | Admitting: *Deleted

## 2016-09-08 DIAGNOSIS — I480 Paroxysmal atrial fibrillation: Secondary | ICD-10-CM | POA: Diagnosis not present

## 2016-09-08 NOTE — Progress Notes (Signed)
carelink summary report 

## 2016-09-16 LAB — CUP PACEART REMOTE DEVICE CHECK
Implantable Pulse Generator Implant Date: 20171002
MDC IDC SESS DTM: 20171201123553

## 2016-09-22 DIAGNOSIS — Z7901 Long term (current) use of anticoagulants: Secondary | ICD-10-CM | POA: Diagnosis not present

## 2016-09-28 DIAGNOSIS — M7062 Trochanteric bursitis, left hip: Secondary | ICD-10-CM | POA: Diagnosis not present

## 2016-09-28 DIAGNOSIS — L309 Dermatitis, unspecified: Secondary | ICD-10-CM | POA: Diagnosis not present

## 2016-10-01 ENCOUNTER — Telehealth: Payer: Self-pay | Admitting: Cardiology

## 2016-10-01 NOTE — Telephone Encounter (Signed)
LMOVM requesting that pt send manual transmission b/c home monitor has not updated in at least 14 days.    

## 2016-10-06 ENCOUNTER — Ambulatory Visit (INDEPENDENT_AMBULATORY_CARE_PROVIDER_SITE_OTHER): Payer: Medicare Other | Admitting: *Deleted

## 2016-10-06 DIAGNOSIS — I480 Paroxysmal atrial fibrillation: Secondary | ICD-10-CM | POA: Diagnosis not present

## 2016-10-06 NOTE — Progress Notes (Signed)
Carelink Summary Report / Loop Recorder 

## 2016-10-14 DIAGNOSIS — B0089 Other herpesviral infection: Secondary | ICD-10-CM | POA: Diagnosis not present

## 2016-10-14 DIAGNOSIS — L821 Other seborrheic keratosis: Secondary | ICD-10-CM | POA: Diagnosis not present

## 2016-10-20 DIAGNOSIS — Z7901 Long term (current) use of anticoagulants: Secondary | ICD-10-CM | POA: Diagnosis not present

## 2016-10-24 LAB — CUP PACEART REMOTE DEVICE CHECK
Implantable Pulse Generator Implant Date: 20171002
MDC IDC SESS DTM: 20171231123714

## 2016-10-24 NOTE — Progress Notes (Signed)
Carelink summary report received. Battery status OK. Normal device function. No tachy episodes, brady, or pause episodes. 13 symptom- avail. ECGs appear: SR, PACs, some w/ AFib. Pt was seen by AF clinic, was given cardizem to use PRN for breakthrough AF. 8 AF 1% +warfarin. Monthly summary reports and ROV/PRN

## 2016-10-26 DIAGNOSIS — M25552 Pain in left hip: Secondary | ICD-10-CM | POA: Diagnosis not present

## 2016-10-27 ENCOUNTER — Ambulatory Visit (INDEPENDENT_AMBULATORY_CARE_PROVIDER_SITE_OTHER): Payer: Medicare Other | Admitting: Cardiology

## 2016-10-27 ENCOUNTER — Encounter: Payer: Self-pay | Admitting: Cardiology

## 2016-10-27 VITALS — BP 126/70 | HR 70 | Ht 65.0 in | Wt 175.0 lb

## 2016-10-27 DIAGNOSIS — I480 Paroxysmal atrial fibrillation: Secondary | ICD-10-CM | POA: Diagnosis not present

## 2016-10-27 DIAGNOSIS — Z95818 Presence of other cardiac implants and grafts: Secondary | ICD-10-CM

## 2016-10-27 DIAGNOSIS — Z7901 Long term (current) use of anticoagulants: Secondary | ICD-10-CM | POA: Diagnosis not present

## 2016-10-27 DIAGNOSIS — I10 Essential (primary) hypertension: Secondary | ICD-10-CM | POA: Diagnosis not present

## 2016-10-27 NOTE — Patient Instructions (Signed)

## 2016-10-27 NOTE — Progress Notes (Signed)
Cardiology Office Note    Date:  10/27/2016   ID:  Frances [name], DOB 09-10-1933, MRN [id number]  PCP:  Mathews Argyle, MD  Cardiologist:   [name], MD     History of Present Illness:  Frances [name] is a 83 y.o. female here for follow-up of paroxysmal atrial fibrillation with ablation therapy on 11/28/15 with hypothyroidism, CKD, hypertension, PAD. Dr. Rayann Heman. She was seen Previously by Roderic Palau and endorsed chest discomfort, pressure shortness of breath. Myoview was performed and was interpreted as intermediate risk.  Right after her ablation she did notice some symptoms of chest pressure and shortness of breath and she would have to stop and rest with going up stairs.  Still feeling hot flashes after several years has a little fan that she carries around with her. Sometimes this accompanies palpitations.  She will occasionally feel palpitations, and has had an implantable loop recorder in place to record potential atrial fibrillation episodes. She had a three-hour episode in December. She takes additional diltiazem if necessary.  She recounted her story of going to sleep evaluation on 10/28/2015 and feeling atrial fibrillation previous to this. She had a syncopal episode in the middle the night and was rushed to the emergency room.    Past Medical History:  Diagnosis Date  . Arthritis    hands, back  . Chronic kidney disease    stage 3 renal disease - no med  . Dysrhythmia    Hx - a-fib 05/2010 - tx with meds, no problem since 05/2010  . Hearing loss    bilateral hearing aids  . Hypertension    controlled with meds  . Hypothyroidism   . Internal hemorrhoid   . Paroxysmal atrial fibrillation (HCC)   . Peripheral vascular disease ([place])    right arm and right shoulder blood clots r/t a fall  . Rectal bleeding   . SVD (spontaneous vaginal delivery) 1957, 1959   x 2  . Thyroid disease    hypothyroidism    Past Surgical History:  Procedure  Laterality Date  . ABDOMINAL HYSTERECTOMY     and right ovary  . ANTERIOR AND POSTERIOR REPAIR N/A 01/03/2014   Procedure: ANTERIOR (CYSTOCELE) REPAIR;  Surgeon: Floyce Stakes. Pamala Hurry, MD;  Location: [facility];  Service: Gynecology;  Laterality: N/A;  . APPENDECTOMY    . BACK SURGERY     x 3 - 2 rods and 8 screws  . COLONOSCOPY    . CYSTOCELE REPAIR    . DENTAL SURGERY     infected tooth - general  . DILATION AND CURETTAGE OF UTERUS     x several  . ELECTROPHYSIOLOGIC STUDY N/A 11/28/2015   Procedure: Atrial Fibrillation Ablation;  Surgeon: [name], MD;  Location: [facility];  Service: Cardiovascular;  Laterality: N/A;  . EP IMPLANTABLE DEVICE N/A 06/08/2016   Procedure: Loop Recorder Insertion;  Surgeon: [name], MD;  Location: [facility];  Service: Cardiovascular;  Laterality: N/A;  . EXAMINATION UNDER ANESTHESIA N/A 01/15/2014   Procedure: EXAM UNDER ANESTHESIA with evacuation of hematoma and over sewing of vaginal mucosa.;  Surgeon: Floyce Stakes. Pamala Hurry, MD;  Location: [facility];  Service: Gynecology;  Laterality: N/A;  . HAND SURGERY     left  . hemorrhoid injection  04/2011  . JOINT REPLACEMENT     left thumb replacement  . TONSILLECTOMY      Current Medications: Outpatient Medications Prior to Visit  Medication Sig Dispense Refill  . Calcium  Carbonate-Vitamin D (CALCIUM-VITAMIN D) 500-200 MG-UNIT per tablet Take 1 tablet by mouth daily at 12 noon. Reported on 10/29/2015    . cholecalciferol (VITAMIN D) 1000 units tablet Take 1,000 Units by mouth daily with lunch.    . diltiazem (CARDIZEM CD) 180 MG 24 hr capsule Take 1 capsule (180 mg total) by mouth daily.    Marland Kitchen diltiazem (CARDIZEM) 30 MG tablet Take 1 tablet every 4 hours AS NEEDED for fast heart rate >100 as long as blood pressure >100. 45 tablet 3  . hydrochlorothiazide (HYDRODIURIL) 25 MG tablet Take 25 mg by mouth daily.    Marland Kitchen levothyroxine (SYNTHROID, LEVOTHROID) 75 MCG tablet Take 75 mcg by mouth daily.      .  Multiple Vitamin (MULTIVITAMIN) tablet Take 0.5 tablets by mouth 2 (two) times daily.     . Omega-3 Fatty Acids (MINI FISH OIL PO) Take 788 mg by mouth daily with lunch.    . Probiotic Product (PROBIOTIC PO) Take 1 tablet by mouth daily after lunch.     . psyllium (METAMUCIL) 58.6 % powder Take 1 packet by mouth daily.    . ramipril (ALTACE) 10 MG tablet Take 10 mg by mouth daily.      . vitamin C (ASCORBIC ACID) 500 MG tablet Take 500 mg by mouth daily.     Marland Kitchen warfarin (COUMADIN) 1 MG tablet Take 2 mg by mouth as directed. Current dose is alternating 3 mg-3mg -2mg  cyclically    . warfarin (COUMADIN) 3 MG tablet Take 3 mg by mouth as directed. Current dose is alternating 3 mg-3mg -2mg  cyclically     No facility-administered medications prior to visit.      Allergies:   Pneumovax 23 [pneumococcal vac polyvalent]; Codeine; Cephalexin; Sudafed [pseudoephedrine hcl]; and Sulfa antibiotics   Social History   Social History  . Marital status: Married    Spouse name: N/A  . Number of children: N/A  . Years of education: N/A   Social History Main Topics  . Smoking status: Never Smoker  . Smokeless tobacco: Never Used  . Alcohol use No  . Drug use: No  . Sexual activity: Not Currently    Birth control/ protection: Post-menopausal   Other Topics Concern  . None   Social History Narrative  . None     Family History:  The patient's family history includes Diabetes in her father; Heart attack in her mother and sister; Heart disease in her brother, mother, and sister; Heart failure in her brother; Hypertension in her father and sister; Stroke in her father; Sudden death in her maternal grandfather.   ROS:   Please see the history of present illness.    ROS All other systems reviewed and are negative.   PHYSICAL EXAM:   VS:  BP 126/70   Pulse 70   Ht 5\' 5"  (1.651 m)   Wt 175 lb (79.4 kg)   LMP  (LMP Unknown)   BMI 29.12 kg/m    GEN: Well nourished, well developed, in no acute  distress HEENT: normal Neck: no JVD, carotid bruits, or masses Cardiac: RRR; no murmurs, rubs, or gallops,no edema  Respiratory:  clear to auscultation bilaterally, normal work of breathing GI: soft, nontender, nondistended, + BS MS: no deformity or atrophy Skin: warm and dry, no rash Neuro:  Alert and Oriented x 3, Strength and sensation are intact Psych: euthymic mood, full affect  Wt Readings from Last 3 Encounters:  10/27/16 175 lb (79.4 kg)  08/11/16 177 lb (80.3 kg)  07/20/16 174 lb 3.2 oz (79 kg)      Studies/Labs Reviewed:   EKG:  EKG is not ordered today.    Recent Labs: 11/14/2015: BUN 16; Creat 0.93; Hemoglobin 14.1; Platelets 221; Potassium 4.1; Sodium 140   Lipid Panel No results found for: CHOL, TRIG, HDL, CHOLHDL, VLDL, LDLCALC, LDLDIRECT  Additional studies/ records that were reviewed today include:  Prior office visit, lab work, stress test reviewed    ASSESSMENT:    1. Paroxysmal atrial fibrillation (HCC)   2. Essential hypertension   3. Chronic anticoagulation   4. Status post placement of implantable loop recorder      PLAN:  In order of problems listed above:  Paroxysmal atrial fibrillation  - Post ablation. 11/28/15  - Overall she is having some PACs but no prolonged evidence of atrial fibrillation.  - Has an implantable loop recorder. Three-hour episode of A. fib. She showed me her apple watch as well which reported heart rate in the 130 range earlier this morning briefly.  - She has additional diltiazem to take in these situations. She is no longer on flecainide.  Dyspnea  - Improved. Noticing it going up stairs.  - Continue with conditioning efforts. She jokes that she now walks the [employer] parking lot when she takes her husband for his radiation treatments for myeloma.  Essential hypertension  - Overall has been under good control.  Anticoagulation  - Continue. Ideally, she states that she would like to be on Eliquis but this is  not on her formulary. She will continue with current anticoagulation strategy. Coumadin.  Abnormal Myoview  - Intermediate risk. Could be attenuation artifact. Continue with medical management. No need for cardiac catheterization. She is feeling better.    Medication Adjustments/Labs and Tests Ordered: Current medicines are reviewed at length with the patient today.  Concerns regarding medicines are outlined above.  Medication changes, Labs and Tests ordered today are listed in the Patient Instructions below. Patient Instructions  Medication Instructions:  The current medical regimen is effective;  continue present plan and medications.  Follow-Up: Follow up in 6 months with Dr. Marlou Porch.  You will receive a letter in the mail 2 months before you are due.  Please call us when you receive this letter to schedule your follow up appointment.  If you need a refill on your cardiac medications before your next appointment, please call your pharmacy.  Thank you for choosing [facility]!!        Signed, [name], MD  10/27/2016 11:31 AM    [facility] [place], [place], [place]  [address] Phone: [phone]; Fax: [phone]

## 2016-11-04 LAB — CUP PACEART REMOTE DEVICE CHECK
Date Time Interrogation Session: 20180130130608
MDC IDC PG IMPLANT DT: 20171002

## 2016-11-05 ENCOUNTER — Ambulatory Visit (INDEPENDENT_AMBULATORY_CARE_PROVIDER_SITE_OTHER): Payer: Medicare Other | Admitting: *Deleted

## 2016-11-05 DIAGNOSIS — I480 Paroxysmal atrial fibrillation: Secondary | ICD-10-CM | POA: Diagnosis not present

## 2016-11-05 NOTE — Progress Notes (Signed)
Carelink Summary Report / Loop Recorder 

## 2016-11-12 ENCOUNTER — Telehealth: Payer: Self-pay | Admitting: Cardiology

## 2016-11-12 NOTE — Telephone Encounter (Signed)
Spoke w/ pt and requested that she send a manual transmission b/c her home monitor has not updated in at least 14 days.   

## 2016-11-17 DIAGNOSIS — I129 Hypertensive chronic kidney disease with stage 1 through stage 4 chronic kidney disease, or unspecified chronic kidney disease: Secondary | ICD-10-CM | POA: Diagnosis not present

## 2016-11-17 DIAGNOSIS — R300 Dysuria: Secondary | ICD-10-CM | POA: Diagnosis not present

## 2016-11-17 DIAGNOSIS — N183 Chronic kidney disease, stage 3 (moderate): Secondary | ICD-10-CM | POA: Diagnosis not present

## 2016-11-17 DIAGNOSIS — Z7901 Long term (current) use of anticoagulants: Secondary | ICD-10-CM | POA: Diagnosis not present

## 2016-11-18 DIAGNOSIS — R040 Epistaxis: Secondary | ICD-10-CM | POA: Diagnosis not present

## 2016-11-20 LAB — CUP PACEART REMOTE DEVICE CHECK
MDC IDC PG IMPLANT DT: 20171002
MDC IDC SESS DTM: 20180301130708

## 2016-11-24 ENCOUNTER — Other Ambulatory Visit: Payer: Self-pay | Admitting: Internal Medicine

## 2016-11-24 DIAGNOSIS — H2589 Other age-related cataract: Secondary | ICD-10-CM | POA: Diagnosis not present

## 2016-11-24 DIAGNOSIS — H5203 Hypermetropia, bilateral: Secondary | ICD-10-CM | POA: Diagnosis not present

## 2016-11-24 DIAGNOSIS — H52221 Regular astigmatism, right eye: Secondary | ICD-10-CM | POA: Diagnosis not present

## 2016-11-24 DIAGNOSIS — H25813 Combined forms of age-related cataract, bilateral: Secondary | ICD-10-CM | POA: Diagnosis not present

## 2016-11-24 DIAGNOSIS — H25819 Combined forms of age-related cataract, unspecified eye: Secondary | ICD-10-CM | POA: Diagnosis not present

## 2016-12-03 DIAGNOSIS — N3000 Acute cystitis without hematuria: Secondary | ICD-10-CM | POA: Diagnosis not present

## 2016-12-03 DIAGNOSIS — R300 Dysuria: Secondary | ICD-10-CM | POA: Diagnosis not present

## 2016-12-07 ENCOUNTER — Ambulatory Visit (INDEPENDENT_AMBULATORY_CARE_PROVIDER_SITE_OTHER): Payer: Medicare Other | Admitting: *Deleted

## 2016-12-07 DIAGNOSIS — I480 Paroxysmal atrial fibrillation: Secondary | ICD-10-CM

## 2016-12-07 NOTE — Progress Notes (Signed)
Carelink Summary Report / Loop Recorder 

## 2016-12-08 DIAGNOSIS — M7061 Trochanteric bursitis, right hip: Secondary | ICD-10-CM | POA: Diagnosis not present

## 2016-12-08 DIAGNOSIS — M25552 Pain in left hip: Secondary | ICD-10-CM | POA: Diagnosis not present

## 2016-12-08 DIAGNOSIS — M7062 Trochanteric bursitis, left hip: Secondary | ICD-10-CM | POA: Diagnosis not present

## 2016-12-09 LAB — CUP PACEART REMOTE DEVICE CHECK
Implantable Pulse Generator Implant Date: 20171002
MDC IDC SESS DTM: 20180331133628

## 2016-12-15 DIAGNOSIS — Z7901 Long term (current) use of anticoagulants: Secondary | ICD-10-CM | POA: Diagnosis not present

## 2016-12-23 DIAGNOSIS — M7062 Trochanteric bursitis, left hip: Secondary | ICD-10-CM | POA: Diagnosis not present

## 2016-12-23 DIAGNOSIS — M7061 Trochanteric bursitis, right hip: Secondary | ICD-10-CM | POA: Diagnosis not present

## 2016-12-29 DIAGNOSIS — M7061 Trochanteric bursitis, right hip: Secondary | ICD-10-CM | POA: Diagnosis not present

## 2016-12-29 DIAGNOSIS — M7062 Trochanteric bursitis, left hip: Secondary | ICD-10-CM | POA: Diagnosis not present

## 2017-01-01 DIAGNOSIS — M7062 Trochanteric bursitis, left hip: Secondary | ICD-10-CM | POA: Diagnosis not present

## 2017-01-01 DIAGNOSIS — M7061 Trochanteric bursitis, right hip: Secondary | ICD-10-CM | POA: Diagnosis not present

## 2017-01-04 ENCOUNTER — Ambulatory Visit (INDEPENDENT_AMBULATORY_CARE_PROVIDER_SITE_OTHER): Payer: Medicare Other | Admitting: *Deleted

## 2017-01-04 DIAGNOSIS — M7061 Trochanteric bursitis, right hip: Secondary | ICD-10-CM | POA: Diagnosis not present

## 2017-01-04 DIAGNOSIS — M7062 Trochanteric bursitis, left hip: Secondary | ICD-10-CM | POA: Diagnosis not present

## 2017-01-04 DIAGNOSIS — I480 Paroxysmal atrial fibrillation: Secondary | ICD-10-CM

## 2017-01-04 DIAGNOSIS — M25552 Pain in left hip: Secondary | ICD-10-CM | POA: Diagnosis not present

## 2017-01-04 NOTE — Progress Notes (Signed)
Carelink Summary Report / Loop Recorder 

## 2017-01-06 DIAGNOSIS — M7062 Trochanteric bursitis, left hip: Secondary | ICD-10-CM | POA: Diagnosis not present

## 2017-01-06 DIAGNOSIS — M7061 Trochanteric bursitis, right hip: Secondary | ICD-10-CM | POA: Diagnosis not present

## 2017-01-07 ENCOUNTER — Telehealth: Payer: Self-pay | Admitting: *Deleted

## 2017-01-07 NOTE — Telephone Encounter (Signed)
Spoke with patient to request a manual Carelink transmission for review.  Monitor has not updated since 12/27/16.  Patient moved monitor to ideal position so that hopefully it will resume automatic nightly monitoring.    Upon review of transmission, patient has used symptom activator 12 times since 12/11/16.  All available ECGs show SR/ST with rare PACs.  No symptom episodes correlate with detected AF episodes.  Longest detected recent AF episode was 2hr 33min on 12/03/16.   Patient reports that during her symptom episodes, she experiences sudden onset of lightheadedness.  This was her symptom with AF historically, so she is concerned about these episodes.  Advised patient that there does not appear to be any correlation between her marked symptom episodes and her heart rhythm.  She has been unable to check her BP during these episodes, but states the lightheadedness usually occurs when she is already standing and forces her to go sit down to rest for a period of time.  Advised patient I will forward this information to Dr. Rayann Heman for any additional recommendations.  Episode list and ECGs printed and placed in [facility] folder for review.  Patient is appreciative and denies additional questions or concerns at this time.

## 2017-01-12 DIAGNOSIS — Z7901 Long term (current) use of anticoagulants: Secondary | ICD-10-CM | POA: Diagnosis not present

## 2017-01-17 LAB — CUP PACEART REMOTE DEVICE CHECK
Implantable Pulse Generator Implant Date: 20171002
MDC IDC SESS DTM: 20180430140742

## 2017-01-17 NOTE — Progress Notes (Signed)
Carelink summary report received. Battery status OK. Normal device function. No tachy episodes, brady, or pause episodes. 21 symptom- 2 w/ ECGs that appear SR. 4 AF 1.1%- no ECGs. +warfarin.Monthly summary reports and ROV/PRN

## 2017-01-18 ENCOUNTER — Telehealth: Payer: Self-pay | Admitting: Cardiology

## 2017-01-18 NOTE — Telephone Encounter (Signed)
Spoke with pt, pt stated that she had pressed her symptom activator 5/10 about 10:30am, informed pt that an episode was not transmitted, pt stated that she thought that if she pressed her symptom activator that it would transmit overnight, pt was worried that her monitor was not working correctly. Informed pt that a transmission had been received 01/13/2017. Pt frustrated with fact that symptom episode did not transmit, informed pt that unless the symptom episode correlated with set parameters on the device then an automatic report would not be sent, she would need to send in a manual transmission. Pt stated that she would send in a manual transmission at this very moment. Pt stated that on Thursday she and her husband were working out in the garden about 10:30am and she felt like she was going to pass out, she stated that she saw "clouds out of her peripheral vision" and that if she didn't sit down she was going to fall down. Pt stated the ran in the house and pressed her symptom activator. Informed pt that manual transmission was received, asked pt about symptom episodes from May 12 at 17:14 pt couldn't recall anything, symptom episode from 5/11 at 12:32, pt stated "dont you have anything from Thursday at 10:30am? Informed pt that there were several other symtom episodes prior to Thursday at 10:30, and the device only has so much memory and will start erasing older episodes, therefore there was not an ECG for that actual episode. Pt was very frustrated at this stated that she did not know that. Apologized to pt that she had not been informed. Pt stated that most likely the other episodes were just feeling of lightheadedness and she had to sit down but couldn't recall exactly. . Pt asked if she should just start sending in manual transmissions every time she presses hers symptom activator, informed pt that when she press her symptom activator she should call the device clinic and send in a manual transmission.

## 2017-01-18 NOTE — Telephone Encounter (Signed)
Follow Up:   Please call,pt said she had called on Friday.

## 2017-01-20 DIAGNOSIS — N183 Chronic kidney disease, stage 3 (moderate): Secondary | ICD-10-CM | POA: Diagnosis not present

## 2017-01-20 DIAGNOSIS — I480 Paroxysmal atrial fibrillation: Secondary | ICD-10-CM | POA: Diagnosis not present

## 2017-01-20 DIAGNOSIS — I129 Hypertensive chronic kidney disease with stage 1 through stage 4 chronic kidney disease, or unspecified chronic kidney disease: Secondary | ICD-10-CM | POA: Diagnosis not present

## 2017-01-20 DIAGNOSIS — E039 Hypothyroidism, unspecified: Secondary | ICD-10-CM | POA: Diagnosis not present

## 2017-01-25 ENCOUNTER — Encounter: Payer: Self-pay | Admitting: Internal Medicine

## 2017-01-25 ENCOUNTER — Ambulatory Visit (INDEPENDENT_AMBULATORY_CARE_PROVIDER_SITE_OTHER): Payer: Medicare Other | Admitting: Internal Medicine

## 2017-01-25 VITALS — BP 130/79 | HR 84 | Ht 65.0 in | Wt 172.8 lb

## 2017-01-25 DIAGNOSIS — R911 Solitary pulmonary nodule: Secondary | ICD-10-CM

## 2017-01-25 DIAGNOSIS — I480 Paroxysmal atrial fibrillation: Secondary | ICD-10-CM

## 2017-01-25 DIAGNOSIS — R002 Palpitations: Secondary | ICD-10-CM | POA: Diagnosis not present

## 2017-01-25 LAB — CUP PACEART INCLINIC DEVICE CHECK
Date Time Interrogation Session: 20180521113441
MDC IDC PG IMPLANT DT: 20171002

## 2017-01-25 NOTE — Patient Instructions (Addendum)
Medication Instructions:  Your physician recommends that you continue on your current medications as directed. Please refer to the Current Medication list given to you today.   Labwork: None ordered   Testing/Procedures: None ordered   Follow-Up: Your physician wants you to follow-up in: as scheduled with Dr Marlou Porch and 12 months with Dr Nathanial Rancher will receive a reminder letter in the mail two months in advance. If you don't receive a letter, please call our office to schedule the follow-up appointment.   Any Other Special Instructions Will Be Listed Below (If Applicable).     If you need a refill on your cardiac medications before your next appointment, please call your pharmacy.

## 2017-01-25 NOTE — Progress Notes (Signed)
PCP: Lajean Manes, MD Primary Cardiologist:  Dr Marlou Porch  Frances [name] is a 83 y.o. female who presents today for routine electrophysiology followup.  Since last being seen in our clinic, the patient reports doing very well.  She continues to have episodes of flushing with diaphoresis and palpitations.  Her ILR has recorded only sinus rhythm during these episodes.  Today, she denies symptoms of chest pain, shortness of breath,  lower extremity edema, dizziness, presyncope, or syncope.  The patient is otherwise without complaint today.   Past Medical History:  Diagnosis Date  . Arthritis    hands, back  . Chronic kidney disease    stage 3 renal disease - no med  . Dysrhythmia    Hx - a-fib 05/2010 - tx with meds, no problem since 05/2010  . Hearing loss    bilateral hearing aids  . Hypertension    controlled with meds  . Hypothyroidism   . Internal hemorrhoid   . Paroxysmal atrial fibrillation (HCC)   . Peripheral vascular disease ([place])    right arm and right shoulder blood clots r/t a fall  . Rectal bleeding   . SVD (spontaneous vaginal delivery) 1957, 1959   x 2  . Thyroid disease    hypothyroidism   Past Surgical History:  Procedure Laterality Date  . ABDOMINAL HYSTERECTOMY     and right ovary  . ANTERIOR AND POSTERIOR REPAIR N/A 01/03/2014   Procedure: ANTERIOR (CYSTOCELE) REPAIR;  Surgeon: Floyce Stakes. Pamala Hurry, MD;  Location: [facility];  Service: Gynecology;  Laterality: N/A;  . APPENDECTOMY    . BACK SURGERY     x 3 - 2 rods and 8 screws  . COLONOSCOPY    . CYSTOCELE REPAIR    . DENTAL SURGERY     infected tooth - general  . DILATION AND CURETTAGE OF UTERUS     x several  . ELECTROPHYSIOLOGIC STUDY N/A 11/28/2015   Procedure: Atrial Fibrillation Ablation;  Surgeon: [name], MD;  Location: [facility];  Service: Cardiovascular;  Laterality: N/A;  . EP IMPLANTABLE DEVICE N/A 06/08/2016   Procedure: Loop Recorder Insertion;  Surgeon: [name], MD;   Location: [facility];  Service: Cardiovascular;  Laterality: N/A;  . EXAMINATION UNDER ANESTHESIA N/A 01/15/2014   Procedure: EXAM UNDER ANESTHESIA with evacuation of hematoma and over sewing of vaginal mucosa.;  Surgeon: Floyce Stakes. Pamala Hurry, MD;  Location: [facility];  Service: Gynecology;  Laterality: N/A;  . HAND SURGERY     left  . hemorrhoid injection  04/2011  . JOINT REPLACEMENT     left thumb replacement  . TONSILLECTOMY      ROS- all systems are reviewed and negatives except as per HPI above  Current Outpatient Prescriptions  Medication Sig Dispense Refill  . Calcium Carbonate-Vitamin D (CALCIUM-VITAMIN D) 500-200 MG-UNIT per tablet Take 1 tablet by mouth daily at 12 noon. Reported on 10/29/2015    . cholecalciferol (VITAMIN D) 1000 units tablet Take 1,000 Units by mouth daily with lunch.    . diltiazem (CARDIZEM CD) 180 MG 24 hr capsule Take 1 capsule (180 mg total) by mouth daily.    Marland Kitchen diltiazem (CARDIZEM) 30 MG tablet Take 1 tablet by mouth every 4 hours AS NEEDED for fast heart rate >100 as long as blood pressure >100.    . hydrochlorothiazide (HYDRODIURIL) 25 MG tablet Take 25 mg by mouth daily.    Marland Kitchen levothyroxine (SYNTHROID, LEVOTHROID) 75 MCG tablet Take 75 mcg by mouth  daily.      . Multiple Vitamin (MULTIVITAMIN) tablet Take 0.5 tablets by mouth 2 (two) times daily.     . Omega-3 Fatty Acids (MINI FISH OIL PO) Take 788 mg by mouth daily with lunch.    . Probiotic Product (PROBIOTIC PO) Take 1 tablet by mouth daily after lunch.     . psyllium (METAMUCIL) 58.6 % powder Take 1 packet by mouth daily.    . ramipril (ALTACE) 10 MG tablet Take 10 mg by mouth daily.      . vitamin C (ASCORBIC ACID) 500 MG tablet Take 500 mg by mouth daily.     Marland Kitchen warfarin (COUMADIN) 1 MG tablet Take 2 mg by mouth as directed. Current dose is alternating 3 mg-3mg -2mg  cyclically    . warfarin (COUMADIN) 3 MG tablet Take 3 mg by mouth as directed. Current dose is alternating 3 mg-3mg -2mg  cyclically      No current facility-administered medications for this visit.     Physical Exam: Vitals:   01/25/17 1011  BP: 130/79  Pulse: 84  SpO2: 95%  Weight: 172 lb 12.8 oz (78.4 kg)  Height: 5\' 5"  (1.651 m)    GEN- The patient is well appearing, alert and oriented x 3 today.   Head- normocephalic, atraumatic Eyes-  Sclera clear, conjunctiva pink Ears- hearing intact Oropharynx- clear Lungs- Clear to ausculation bilaterally, normal work of breathing Heart- Regular rate and rhythm, no murmurs, rubs or gallops, PMI not laterally displaced GI- soft, NT, ND, + BS Extremities- no clubbing, cyanosis, or edema  ILR is reviewed and reveals afib burden of 0.5%.  She has 88 symptomatic transmissions which correspond to sinus rhythm  Assessment and Plan:  1.  afib Well controlled post ablation No changes today  2. Palpitations Not due to afib.  ILR only reveals sinus with episodes. She appears to have episodes of flushing and diaphoresis which are likely vasomotor and either hormonal or emotional in etiology.  I have spoken with Dr Felipa Eth who will assist in further workup.  She has a small adrenal nodule, so perhaps further endocrine evaluation would be beneficial  3. Pulmonary nodule Small pulmonary nodule on CT. No further workup advised for low risk patients, repeat CT considered for high risk patients.  She would like to discuss with Dr Felipa Eth.  carelink Follow-up with Dr Marlou Porch as scheduled I will see again in a year  [name] MD, [facility] 01/25/2017 3:32 PM

## 2017-01-26 ENCOUNTER — Telehealth: Payer: Self-pay | Admitting: *Deleted

## 2017-01-26 DIAGNOSIS — H2513 Age-related nuclear cataract, bilateral: Secondary | ICD-10-CM | POA: Diagnosis not present

## 2017-01-26 DIAGNOSIS — H25043 Posterior subcapsular polar age-related cataract, bilateral: Secondary | ICD-10-CM | POA: Diagnosis not present

## 2017-01-26 DIAGNOSIS — H25013 Cortical age-related cataract, bilateral: Secondary | ICD-10-CM | POA: Diagnosis not present

## 2017-01-26 DIAGNOSIS — H2512 Age-related nuclear cataract, left eye: Secondary | ICD-10-CM | POA: Diagnosis not present

## 2017-01-26 DIAGNOSIS — I10 Essential (primary) hypertension: Secondary | ICD-10-CM | POA: Diagnosis not present

## 2017-01-26 NOTE — Telephone Encounter (Signed)
LMOVM regarding sending a manual transmission. 8 symptom episodes, no available ECGs. 2 AF episodes, no available ECGs. [facility] phone number to call back if further assistance is needed.

## 2017-01-26 NOTE — Telephone Encounter (Signed)
Manual transmission received. Episodes previously reviewed with Dr. Rayann Heman at [facility] 01/25/17.

## 2017-01-27 ENCOUNTER — Other Ambulatory Visit: Payer: Self-pay | Admitting: Geriatric Medicine

## 2017-01-27 DIAGNOSIS — R911 Solitary pulmonary nodule: Secondary | ICD-10-CM

## 2017-01-27 DIAGNOSIS — N183 Chronic kidney disease, stage 3 (moderate): Secondary | ICD-10-CM | POA: Diagnosis not present

## 2017-01-27 DIAGNOSIS — I129 Hypertensive chronic kidney disease with stage 1 through stage 4 chronic kidney disease, or unspecified chronic kidney disease: Secondary | ICD-10-CM | POA: Diagnosis not present

## 2017-01-27 DIAGNOSIS — D3500 Benign neoplasm of unspecified adrenal gland: Secondary | ICD-10-CM | POA: Diagnosis not present

## 2017-01-28 DIAGNOSIS — D3500 Benign neoplasm of unspecified adrenal gland: Secondary | ICD-10-CM | POA: Diagnosis not present

## 2017-01-29 ENCOUNTER — Ambulatory Visit
Admission: RE | Admit: 2017-01-29 | Discharge: 2017-01-29 | Disposition: A | Discharge status: Home / Self Care | Payer: Medicare Other | Source: Ambulatory Visit | Attending: Geriatric Medicine | Admitting: Geriatric Medicine

## 2017-01-29 DIAGNOSIS — R911 Solitary pulmonary nodule: Secondary | ICD-10-CM

## 2017-02-02 ENCOUNTER — Telehealth: Payer: Self-pay | Admitting: Cardiology

## 2017-02-02 NOTE — Telephone Encounter (Signed)
Patient called and stated that it took her forever to cook breakfast b/c she had to keep sitting down b/c she keep getting dizzy. She used her symptom activator.  Instructed pt to send a manual transmission with her home monitor. Informed pt that once the remote transmission was received a [facility] RN will call her back. Pt verbalized understanding.

## 2017-02-02 NOTE — Telephone Encounter (Signed)
Transmission received. Symptom episode shows ST with occasional PACs. Printed for Dr. Rayann Heman to review. Mrs. Rabon made aware.

## 2017-02-03 ENCOUNTER — Ambulatory Visit (INDEPENDENT_AMBULATORY_CARE_PROVIDER_SITE_OTHER): Payer: Medicare Other | Admitting: *Deleted

## 2017-02-03 DIAGNOSIS — I480 Paroxysmal atrial fibrillation: Secondary | ICD-10-CM | POA: Diagnosis not present

## 2017-02-03 DIAGNOSIS — R002 Palpitations: Secondary | ICD-10-CM

## 2017-02-04 NOTE — Progress Notes (Signed)
Carelink Summary Report 

## 2017-02-05 ENCOUNTER — Other Ambulatory Visit: Payer: Self-pay | Admitting: Internal Medicine

## 2017-02-05 DIAGNOSIS — I129 Hypertensive chronic kidney disease with stage 1 through stage 4 chronic kidney disease, or unspecified chronic kidney disease: Secondary | ICD-10-CM | POA: Diagnosis not present

## 2017-02-05 DIAGNOSIS — N183 Chronic kidney disease, stage 3 (moderate): Secondary | ICD-10-CM | POA: Diagnosis not present

## 2017-02-05 DIAGNOSIS — J301 Allergic rhinitis due to pollen: Secondary | ICD-10-CM | POA: Diagnosis not present

## 2017-02-05 DIAGNOSIS — R110 Nausea: Secondary | ICD-10-CM | POA: Diagnosis not present

## 2017-02-05 DIAGNOSIS — I480 Paroxysmal atrial fibrillation: Secondary | ICD-10-CM | POA: Diagnosis not present

## 2017-02-05 LAB — CUP PACEART REMOTE DEVICE CHECK
Date Time Interrogation Session: 20180530140733
MDC IDC PG IMPLANT DT: 20171002

## 2017-02-09 DIAGNOSIS — E038 Other specified hypothyroidism: Secondary | ICD-10-CM | POA: Diagnosis not present

## 2017-02-09 DIAGNOSIS — Z7901 Long term (current) use of anticoagulants: Secondary | ICD-10-CM | POA: Diagnosis not present

## 2017-02-09 DIAGNOSIS — N183 Chronic kidney disease, stage 3 (moderate): Secondary | ICD-10-CM | POA: Diagnosis not present

## 2017-02-17 DIAGNOSIS — M7061 Trochanteric bursitis, right hip: Secondary | ICD-10-CM | POA: Diagnosis not present

## 2017-02-17 DIAGNOSIS — M25552 Pain in left hip: Secondary | ICD-10-CM | POA: Diagnosis not present

## 2017-02-17 DIAGNOSIS — M7062 Trochanteric bursitis, left hip: Secondary | ICD-10-CM | POA: Diagnosis not present

## 2017-02-24 ENCOUNTER — Other Ambulatory Visit: Payer: Self-pay | Admitting: Obstetrics

## 2017-02-24 DIAGNOSIS — Z1231 Encounter for screening mammogram for malignant neoplasm of breast: Secondary | ICD-10-CM

## 2017-03-05 ENCOUNTER — Ambulatory Visit (INDEPENDENT_AMBULATORY_CARE_PROVIDER_SITE_OTHER): Payer: Medicare Other | Admitting: *Deleted

## 2017-03-05 DIAGNOSIS — I480 Paroxysmal atrial fibrillation: Secondary | ICD-10-CM | POA: Diagnosis not present

## 2017-03-05 NOTE — Progress Notes (Signed)
Carelink Summary Report / Loop Recorder 

## 2017-03-08 DIAGNOSIS — Z961 Presence of intraocular lens: Secondary | ICD-10-CM | POA: Diagnosis not present

## 2017-03-08 DIAGNOSIS — H2512 Age-related nuclear cataract, left eye: Secondary | ICD-10-CM | POA: Diagnosis not present

## 2017-03-09 DIAGNOSIS — H2511 Age-related nuclear cataract, right eye: Secondary | ICD-10-CM | POA: Diagnosis not present

## 2017-03-15 DIAGNOSIS — I129 Hypertensive chronic kidney disease with stage 1 through stage 4 chronic kidney disease, or unspecified chronic kidney disease: Secondary | ICD-10-CM | POA: Diagnosis not present

## 2017-03-15 LAB — CUP PACEART REMOTE DEVICE CHECK
Date Time Interrogation Session: 20180629141001
Implantable Pulse Generator Implant Date: 20171002

## 2017-03-16 DIAGNOSIS — Z7901 Long term (current) use of anticoagulants: Secondary | ICD-10-CM | POA: Diagnosis not present

## 2017-03-19 ENCOUNTER — Telehealth: Payer: Self-pay | Admitting: Internal Medicine

## 2017-03-19 NOTE — Telephone Encounter (Signed)
Spoke to patient about AF episode from this am. Patient states that she's been up since ~ 0300 d/t to rapid HR. She states that she finally took her PRN diltiazem around 0700 and that relieved her sx's. Her HR is now in the 80's. She states that she feels much better. She said that she was told to send in a manual transmission and to call whenever she goes into AF. I told her that the full reports for ILR transmissions have been on a delay getting to the website, but I would look out for it and call her once it's received. I informed patient that the office closes today at 1200 d/t mandatory staff meeting, so if it's not received before 1200 then we will call her back on Monday. I reiterated to her that she can use her Diltiazem 30mg  q4hrs PRN for HRs >100bpm. Patient verbalized understanding.

## 2017-03-19 NOTE — Telephone Encounter (Signed)
New message      Pt states that she is in afib and sent in a manual transmission.  Please call when you get the transmission

## 2017-03-19 NOTE — Telephone Encounter (Addendum)
Spoke with patient, advised that manual transmission was received.  Manual transmission was sent at 0732, but not received in [place] until 1138.    Presenting rhythm (as of 0732) appears SVT, rate ~150bpm.  Per device, patient has been in AF since 0340 this AM.  As patient's symptoms are resolved after taking PRN diltiazem, advised that I will review episodes with Dr. Rayann Heman or one of his partners and call back on Monday with any additional recommendations.  Advised to continue taking diltiazem 30mg  q4hrs PRN for HR >100bpm as long as systolic BP >552 as previously instructed.  Patient is agreeable to this plan and verbalizes understanding of instructions to seek emergency care if her symptoms return and do not resolve with diltiazem.  She denies additional questions or concerns at this time.   Presenting as of 0732:   Sample of episodes:

## 2017-03-19 NOTE — Telephone Encounter (Addendum)
Reviewed with Dr. Caryl Comes, who recommended that patient be scheduled in AF clinic next week if her symptoms are recurrent.  Spoke with patient and advised of plan.  Reiterated instructions to proceed to the ED over the weekend if the PRN diltiazem does not resolve her symptoms.  She is agreeable and will plan to call next week if she feels she needs an appointment.  Patient and husband deny additional questions or concerns.

## 2017-03-22 ENCOUNTER — Ambulatory Visit
Admission: RE | Admit: 2017-03-22 | Discharge: 2017-03-22 | Disposition: A | Discharge status: Home / Self Care | Payer: Medicare Other | Source: Ambulatory Visit | Attending: Obstetrics | Admitting: Obstetrics

## 2017-03-22 DIAGNOSIS — Z1231 Encounter for screening mammogram for malignant neoplasm of breast: Secondary | ICD-10-CM | POA: Diagnosis not present

## 2017-03-24 DIAGNOSIS — M7062 Trochanteric bursitis, left hip: Secondary | ICD-10-CM | POA: Diagnosis not present

## 2017-03-24 DIAGNOSIS — M7061 Trochanteric bursitis, right hip: Secondary | ICD-10-CM | POA: Diagnosis not present

## 2017-03-24 DIAGNOSIS — M25552 Pain in left hip: Secondary | ICD-10-CM | POA: Diagnosis not present

## 2017-04-05 ENCOUNTER — Ambulatory Visit (INDEPENDENT_AMBULATORY_CARE_PROVIDER_SITE_OTHER): Payer: Medicare Other | Admitting: *Deleted

## 2017-04-05 DIAGNOSIS — H26491 Other secondary cataract, right eye: Secondary | ICD-10-CM | POA: Diagnosis not present

## 2017-04-05 DIAGNOSIS — H2511 Age-related nuclear cataract, right eye: Secondary | ICD-10-CM | POA: Diagnosis not present

## 2017-04-05 DIAGNOSIS — Z961 Presence of intraocular lens: Secondary | ICD-10-CM | POA: Diagnosis not present

## 2017-04-05 DIAGNOSIS — H52223 Regular astigmatism, bilateral: Secondary | ICD-10-CM | POA: Diagnosis not present

## 2017-04-05 DIAGNOSIS — I480 Paroxysmal atrial fibrillation: Secondary | ICD-10-CM | POA: Diagnosis not present

## 2017-04-05 DIAGNOSIS — Z9842 Cataract extraction status, left eye: Secondary | ICD-10-CM | POA: Diagnosis not present

## 2017-04-05 NOTE — Progress Notes (Signed)
Carelink Summary Report / Loop Recorder 

## 2017-04-06 DIAGNOSIS — Z7901 Long term (current) use of anticoagulants: Secondary | ICD-10-CM | POA: Diagnosis not present

## 2017-04-17 LAB — CUP PACEART REMOTE DEVICE CHECK
Date Time Interrogation Session: 20180729143829
MDC IDC PG IMPLANT DT: 20171002

## 2017-04-21 ENCOUNTER — Other Ambulatory Visit: Payer: Self-pay | Admitting: Internal Medicine

## 2017-05-03 ENCOUNTER — Encounter: Payer: Self-pay | Admitting: Cardiology

## 2017-05-03 ENCOUNTER — Ambulatory Visit (INDEPENDENT_AMBULATORY_CARE_PROVIDER_SITE_OTHER): Payer: Medicare Other | Admitting: Cardiology

## 2017-05-03 VITALS — BP 136/82 | HR 74 | Ht 65.0 in | Wt 176.4 lb

## 2017-05-03 DIAGNOSIS — I480 Paroxysmal atrial fibrillation: Secondary | ICD-10-CM | POA: Diagnosis not present

## 2017-05-03 DIAGNOSIS — R002 Palpitations: Secondary | ICD-10-CM | POA: Diagnosis not present

## 2017-05-03 DIAGNOSIS — Z95818 Presence of other cardiac implants and grafts: Secondary | ICD-10-CM | POA: Diagnosis not present

## 2017-05-03 NOTE — Progress Notes (Signed)
Cardiology Office Note    Date:  05/03/2017   ID:  Frances [name], DOB November 28, 1933, MRN [id number]  PCP:  Lajean Manes, MD  Cardiologist:   [name], MD     History of Present Illness:  Frances [name] is a 83 y.o. female here for follow-up of paroxysmal atrial fibrillation with ablation therapy on 11/28/15 with hypothyroidism, CKD, hypertension, PAD. Dr. Rayann Heman. She was seen Previously by Roderic Palau and endorsed chest discomfort, pressure shortness of breath. Myoview was performed and was interpreted as intermediate risk.  Right after her ablation she did notice some symptoms of chest pressure and shortness of breath and she would have to stop and rest with going up stairs.  Still feeling hot flashes after several years has a little fan that she carries around with her. Sometimes this accompanies palpitations.  She will occasionally feel palpitations, and has had an implantable loop recorder in place to record potential atrial fibrillation episodes. She had a three-hour episode in December. She takes additional diltiazem if necessary.  She recounted her story of going to sleep evaluation on 10/28/2015 and feeling atrial fibrillation previous to this. She had a syncopal episode in the middle the night and was rushed to the emergency room.  05/03/17-has implantable loop recorder in place. She also reports her heart rate from Apple watch. She showed me a printout from [place] watch. She occasionally will feel an hour to episode of fast heart rates. Thankfully, loop recorder did not show any evidence of atrial fibrillation during this time. This may have been atrial tachycardia. Sinus tachycardia noted as well. Her husband is always concerned about her heart. She has to lay down and take an extra diltiazem if necessary.    Past Medical History:  Diagnosis Date  . Arthritis    hands, back  . Chronic kidney disease    stage 3 renal disease - no med  . Dysrhythmia    Hx -  a-fib 05/2010 - tx with meds, no problem since 05/2010  . Hearing loss    bilateral hearing aids  . Hypertension    controlled with meds  . Hypothyroidism   . Internal hemorrhoid   . Paroxysmal atrial fibrillation (HCC)   . Peripheral vascular disease ([place])    right arm and right shoulder blood clots r/t a fall  . Rectal bleeding   . SVD (spontaneous vaginal delivery) 1957, 1959   x 2  . Thyroid disease    hypothyroidism    Past Surgical History:  Procedure Laterality Date  . ABDOMINAL HYSTERECTOMY     and right ovary  . ANTERIOR AND POSTERIOR REPAIR N/A 01/03/2014   Procedure: ANTERIOR (CYSTOCELE) REPAIR;  Surgeon: Floyce Stakes. Pamala Hurry, MD;  Location: [facility];  Service: Gynecology;  Laterality: N/A;  . APPENDECTOMY    . BACK SURGERY     x 3 - 2 rods and 8 screws  . COLONOSCOPY    . CYSTOCELE REPAIR    . DENTAL SURGERY     infected tooth - general  . DILATION AND CURETTAGE OF UTERUS     x several  . ELECTROPHYSIOLOGIC STUDY N/A 11/28/2015   Procedure: Atrial Fibrillation Ablation;  Surgeon: [name], MD;  Location: [facility];  Service: Cardiovascular;  Laterality: N/A;  . EP IMPLANTABLE DEVICE N/A 06/08/2016   Procedure: Loop Recorder Insertion;  Surgeon: [name], MD;  Location: [facility];  Service: Cardiovascular;  Laterality: N/A;  . EXAMINATION UNDER ANESTHESIA N/A 01/15/2014  Procedure: EXAM UNDER ANESTHESIA with evacuation of hematoma and over sewing of vaginal mucosa.;  Surgeon: Floyce Stakes. Pamala Hurry, MD;  Location: [facility];  Service: Gynecology;  Laterality: N/A;  . HAND SURGERY     left  . hemorrhoid injection  04/2011  . JOINT REPLACEMENT     left thumb replacement  . TONSILLECTOMY      Current Medications: Outpatient Medications Prior to Visit  Medication Sig Dispense Refill  . Calcium Carbonate-Vitamin D (CALCIUM-VITAMIN D) 500-200 MG-UNIT per tablet Take 1 tablet by mouth daily at 12 noon. Reported on 10/29/2015    . diltiazem (CARDIZEM CD) 180  MG 24 hr capsule Take 1 capsule (180 mg total) by mouth daily.    Marland Kitchen diltiazem (CARDIZEM) 30 MG tablet Take 1 tablet by mouth every 4 hours AS NEEDED for fast heart rate >100 as long as blood pressure >100.    Marland Kitchen levothyroxine (SYNTHROID, LEVOTHROID) 75 MCG tablet Take 75 mcg by mouth daily.      . Probiotic Product (PROBIOTIC PO) Take 1 tablet by mouth daily after lunch.     . psyllium (METAMUCIL) 58.6 % powder Take 1 packet by mouth daily.    . ramipril (ALTACE) 10 MG tablet Take 10 mg by mouth daily.      . vitamin C (ASCORBIC ACID) 500 MG tablet Take 500 mg by mouth daily.     Marland Kitchen warfarin (COUMADIN) 1 MG tablet Take 2 mg by mouth as directed. Current dose is alternating 3 mg-3mg -2mg  cyclically    . warfarin (COUMADIN) 3 MG tablet Take 3 mg by mouth as directed. Current dose is alternating 3 mg-3mg -2mg  cyclically    . cholecalciferol (VITAMIN D) 1000 units tablet Take 1,000 Units by mouth daily with lunch.    . hydrochlorothiazide (HYDRODIURIL) 25 MG tablet Take 25 mg by mouth daily.    . Multiple Vitamin (MULTIVITAMIN) tablet Take 0.5 tablets by mouth 2 (two) times daily.     . Omega-3 Fatty Acids (MINI FISH OIL PO) Take 788 mg by mouth daily with lunch.     No facility-administered medications prior to visit.      Allergies:   Pneumovax 23 [pneumococcal vac polyvalent]; Codeine; Cephalexin; Sudafed [pseudoephedrine hcl]; and Sulfa antibiotics   Social History   Social History  . Marital status: Married    Spouse name: N/A  . Number of children: N/A  . Years of education: N/A   Social History Main Topics  . Smoking status: Never Smoker  . Smokeless tobacco: Never Used  . Alcohol use No  . Drug use: No  . Sexual activity: Not Currently    Birth control/ protection: Post-menopausal   Other Topics Concern  . None   Social History Narrative  . None     Family History:  The patient's family history includes Diabetes in her father; Heart attack in her mother and sister; Heart  disease in her brother, mother, and sister; Heart failure in her brother; Hypertension in her father and sister; Stroke in her father; Sudden death in her maternal grandfather.   ROS:   Please see the history of present illness.    ROS All other systems reviewed and are negative.   PHYSICAL EXAM:   VS:  BP 136/82   Pulse 74   Ht 5\' 5"  (1.651 m)   Wt 176 lb 6.4 oz (80 kg)   LMP  (LMP Unknown)   BMI 29.35 kg/m    GEN: Well nourished, well developed, in no acute distress  HEENT: normal  Neck: no JVD, carotid bruits, or masses Cardiac: RRR; no murmurs, rubs, or gallops,no edema  Respiratory:  clear to auscultation bilaterally, normal work of breathing GI: soft, nontender, nondistended, + BS MS: no deformity or atrophy  Skin: warm and dry, no rash Neuro:  Alert and Oriented x 3, Strength and sensation are intact Psych: euthymic mood, full affect  Wt Readings from Last 3 Encounters:  05/03/17 176 lb 6.4 oz (80 kg)  01/25/17 172 lb 12.8 oz (78.4 kg)  10/27/16 175 lb (79.4 kg)      Studies/Labs Reviewed:   EKG:  EKG is not ordered today.    Recent Labs: No results found for requested labs within last 8760 hours.   Lipid Panel No results found for: CHOL, TRIG, HDL, CHOLHDL, VLDL, LDLCALC, LDLDIRECT  Additional studies/ records that were reviewed today include:  Prior office visit, lab work, stress test reviewed  Myoview was previously determined to be intermediate risk however she is not currently having any symptoms, could have been artifactual.   ASSESSMENT:    1. Paroxysmal atrial fibrillation (HCC)   2. Palpitations   3. Status post placement of implantable loop recorder      PLAN:  In order of problems listed above:  Paroxysmal atrial fibrillation  - Post ablation. 11/28/15  - Overall she is having some PACs but no prolonged evidence of atrial fibrillation.  - Has an implantable loop recorder. Three-hour episode of A. fib. She showed me her apple watch as  well which reported heart rate in the 140 range earlier this morning briefly.  - Had forgot where wand is. Had palpitations, no afib though. Light headed.  03/19/17 - had ipad. Show 140 bpm. Once again, not in afib. Took short duration diltiazem.   - She has additional diltiazem to take in these situations. She is no longer on flecainide. She does not wish to take any other antiarrhythmics.  Dyspnea  - Improved. Noticing it going up stairs.  - Continue with conditioning efforts. She jokes that she now walks the [place] parking lot when she takes her husband for his radiation treatments for myeloma.  - Overall seems to be stable.  Essential hypertension  - Overall has been under good control.  Anticoagulation  - Continue. Ideally, she states that she would like to be on Eliquis but this is not on her formulary. She will continue with current anticoagulation strategy. Coumadin. Stable, no bleeding.   Medication Adjustments/Labs and Tests Ordered: Current medicines are reviewed at length with the patient today.  Concerns regarding medicines are outlined above.  Medication changes, Labs and Tests ordered today are listed in the Patient Instructions below. Patient Instructions  Medication Instructions:  The current medical regimen is effective;  continue present plan and medications.  Follow-Up: Follow up in 6 months with Truitt Merle, NP.  You will receive a letter in the mail 2 months before you are due.  Please call us when you receive this letter to schedule your follow up appointment.  Follow up in 1 year with Dr. Marlou Porch.  You will receive a letter in the mail 2 months before you are due.  Please call us when you receive this letter to schedule your follow up appointment.  If you need a refill on your cardiac medications before your next appointment, please call your pharmacy.  Thank you for choosing [facility]!!        Signed, [name], MD  05/03/2017 11:32 AM  [facility] [address], [place], [place]  [address] Phone: [phone]; Fax: [phone]

## 2017-05-03 NOTE — Patient Instructions (Signed)
Medication Instructions:  The current medical regimen is effective;  continue present plan and medications.  Follow-Up: Follow up in 6 months with Frances Gerhardt, NP.  You will receive a letter in the mail 2 months before you are due.  Please call us when you receive this letter to schedule your follow up appointment.  Follow up in 1 year with Frances [name].  You will receive a letter in the mail 2 months before you are due.  Please call us when you receive this letter to schedule your follow up appointment.  If you need a refill on your cardiac medications before your next appointment, please call your pharmacy.  Thank you for choosing [facility]!!     

## 2017-05-04 ENCOUNTER — Ambulatory Visit (INDEPENDENT_AMBULATORY_CARE_PROVIDER_SITE_OTHER): Payer: Medicare Other | Admitting: *Deleted

## 2017-05-04 DIAGNOSIS — I480 Paroxysmal atrial fibrillation: Secondary | ICD-10-CM | POA: Diagnosis not present

## 2017-05-04 DIAGNOSIS — Z7901 Long term (current) use of anticoagulants: Secondary | ICD-10-CM | POA: Diagnosis not present

## 2017-05-05 LAB — CUP PACEART REMOTE DEVICE CHECK
Implantable Pulse Generator Implant Date: 20171002
MDC IDC SESS DTM: 20180828210201

## 2017-05-05 NOTE — Progress Notes (Signed)
Loop Recorder Summary Report 

## 2017-05-11 DIAGNOSIS — Z7901 Long term (current) use of anticoagulants: Secondary | ICD-10-CM | POA: Diagnosis not present

## 2017-05-20 DIAGNOSIS — Z23 Encounter for immunization: Secondary | ICD-10-CM | POA: Diagnosis not present

## 2017-05-25 DIAGNOSIS — Z7901 Long term (current) use of anticoagulants: Secondary | ICD-10-CM | POA: Diagnosis not present

## 2017-06-01 ENCOUNTER — Other Ambulatory Visit: Payer: Self-pay | Admitting: Internal Medicine

## 2017-06-03 ENCOUNTER — Ambulatory Visit (INDEPENDENT_AMBULATORY_CARE_PROVIDER_SITE_OTHER): Payer: Medicare Other | Admitting: *Deleted

## 2017-06-03 DIAGNOSIS — I480 Paroxysmal atrial fibrillation: Secondary | ICD-10-CM | POA: Diagnosis not present

## 2017-06-04 DIAGNOSIS — N183 Chronic kidney disease, stage 3 (moderate): Secondary | ICD-10-CM | POA: Diagnosis not present

## 2017-06-04 DIAGNOSIS — I480 Paroxysmal atrial fibrillation: Secondary | ICD-10-CM | POA: Diagnosis not present

## 2017-06-04 DIAGNOSIS — I129 Hypertensive chronic kidney disease with stage 1 through stage 4 chronic kidney disease, or unspecified chronic kidney disease: Secondary | ICD-10-CM | POA: Diagnosis not present

## 2017-06-04 DIAGNOSIS — R600 Localized edema: Secondary | ICD-10-CM | POA: Diagnosis not present

## 2017-06-04 DIAGNOSIS — L659 Nonscarring hair loss, unspecified: Secondary | ICD-10-CM | POA: Diagnosis not present

## 2017-06-04 LAB — CUP PACEART REMOTE DEVICE CHECK
Date Time Interrogation Session: 20180927200926
MDC IDC PG IMPLANT DT: 20171002

## 2017-06-04 NOTE — Progress Notes (Signed)
Carelink Summary Report / Loop Recorder 

## 2017-06-10 DIAGNOSIS — N183 Chronic kidney disease, stage 3 (moderate): Secondary | ICD-10-CM | POA: Diagnosis not present

## 2017-06-10 DIAGNOSIS — I480 Paroxysmal atrial fibrillation: Secondary | ICD-10-CM | POA: Diagnosis not present

## 2017-06-10 DIAGNOSIS — E039 Hypothyroidism, unspecified: Secondary | ICD-10-CM | POA: Diagnosis not present

## 2017-06-10 DIAGNOSIS — I129 Hypertensive chronic kidney disease with stage 1 through stage 4 chronic kidney disease, or unspecified chronic kidney disease: Secondary | ICD-10-CM | POA: Diagnosis not present

## 2017-06-16 DIAGNOSIS — X32XXXD Exposure to sunlight, subsequent encounter: Secondary | ICD-10-CM | POA: Diagnosis not present

## 2017-06-16 DIAGNOSIS — L570 Actinic keratosis: Secondary | ICD-10-CM | POA: Diagnosis not present

## 2017-06-22 DIAGNOSIS — Z7901 Long term (current) use of anticoagulants: Secondary | ICD-10-CM | POA: Diagnosis not present

## 2017-06-25 ENCOUNTER — Telehealth: Payer: Self-pay | Admitting: Internal Medicine

## 2017-06-25 DIAGNOSIS — M7061 Trochanteric bursitis, right hip: Secondary | ICD-10-CM | POA: Diagnosis not present

## 2017-06-25 DIAGNOSIS — M7062 Trochanteric bursitis, left hip: Secondary | ICD-10-CM | POA: Diagnosis not present

## 2017-06-25 DIAGNOSIS — M25552 Pain in left hip: Secondary | ICD-10-CM | POA: Diagnosis not present

## 2017-06-25 NOTE — Telephone Encounter (Signed)
Frances [name] is having a MRI on Saturday and [name] is asking something to be faxed over to say its ok for her to have the MRI with the Loop Recorder that she has .  Please fax to [phone] Attn MRI . Please call Frances [name] to let her if its ok as well . Thanks

## 2017-06-28 NOTE — Telephone Encounter (Signed)
Spoke with pt and informed her that I would fax over a statement that it is ok for her to have a MRI.

## 2017-06-29 DIAGNOSIS — I480 Paroxysmal atrial fibrillation: Secondary | ICD-10-CM | POA: Diagnosis not present

## 2017-06-29 DIAGNOSIS — R030 Elevated blood-pressure reading, without diagnosis of hypertension: Secondary | ICD-10-CM | POA: Diagnosis not present

## 2017-06-29 DIAGNOSIS — N183 Chronic kidney disease, stage 3 (moderate): Secondary | ICD-10-CM | POA: Diagnosis not present

## 2017-06-29 DIAGNOSIS — I129 Hypertensive chronic kidney disease with stage 1 through stage 4 chronic kidney disease, or unspecified chronic kidney disease: Secondary | ICD-10-CM | POA: Diagnosis not present

## 2017-06-29 DIAGNOSIS — Z0000 Encounter for general adult medical examination without abnormal findings: Secondary | ICD-10-CM | POA: Diagnosis not present

## 2017-06-29 DIAGNOSIS — Z1389 Encounter for screening for other disorder: Secondary | ICD-10-CM | POA: Diagnosis not present

## 2017-07-03 DIAGNOSIS — M25552 Pain in left hip: Secondary | ICD-10-CM | POA: Diagnosis not present

## 2017-07-05 ENCOUNTER — Ambulatory Visit (INDEPENDENT_AMBULATORY_CARE_PROVIDER_SITE_OTHER): Payer: Medicare Other | Admitting: *Deleted

## 2017-07-05 DIAGNOSIS — I480 Paroxysmal atrial fibrillation: Secondary | ICD-10-CM | POA: Diagnosis not present

## 2017-07-05 NOTE — Progress Notes (Signed)
Carelink Summary Report / Loop Recorder 

## 2017-07-06 DIAGNOSIS — Z7901 Long term (current) use of anticoagulants: Secondary | ICD-10-CM | POA: Diagnosis not present

## 2017-07-08 LAB — CUP PACEART REMOTE DEVICE CHECK
Implantable Pulse Generator Implant Date: 20171002
MDC IDC SESS DTM: 20181029115659

## 2017-07-14 DIAGNOSIS — M1612 Unilateral primary osteoarthritis, left hip: Secondary | ICD-10-CM | POA: Diagnosis not present

## 2017-07-14 DIAGNOSIS — M25552 Pain in left hip: Secondary | ICD-10-CM | POA: Diagnosis not present

## 2017-07-14 DIAGNOSIS — M7062 Trochanteric bursitis, left hip: Secondary | ICD-10-CM | POA: Diagnosis not present

## 2017-07-14 DIAGNOSIS — M7061 Trochanteric bursitis, right hip: Secondary | ICD-10-CM | POA: Diagnosis not present

## 2017-07-20 ENCOUNTER — Telehealth: Payer: Self-pay | Admitting: Internal Medicine

## 2017-07-20 NOTE — Telephone Encounter (Signed)
ILR transmission reviewed. Presenting ECG: AF/Vs 84-150. 4.5% AT/AF burden since 07/05/17, + warfarin. (6) tachy episodes, (5) on 11/12,(1) on 11/13--max duration: 27mins, max avg v:162 + Diltiazem 180 qd, diltiazem 30mg  q4hrs PRN.  Patient is currently c/o diarrhea and lightheadedness. She states that her palpitations are better than yesterday.   Patient states the diarrhea began ~1hour ago and she is not sure whether or not all the diltiazem she had yesterday is causing this. I told patient that diarrhea is not a common side effect that I've heard associated with diltiazem, but I instructed her to drink plenty of fluids, such as water and Gatorade to prevent dehydration. Patient verbalized understanding. I also had patient to check her BP while we were on the phone, her current BP is 121/87.   I explained to patient that being in AF is not dangerous. I told her that she needs to make sure that she continues to take her warfarin for CVA prevention and to take the PRN diltiazem for palpitations/rapid HR. I told her that I would forward all of the information to Dr.Allred and notify patient if anything further is recommended. Patient verbalized understanding.

## 2017-07-20 NOTE — Telephone Encounter (Signed)
New message      1. Has your device fired? no  2. Is you device beeping? no  3. Are you experiencing draining or swelling at device site?  no  4. Are you calling to see if we received your device transmission? Yes, she was in AFIB all day yesterday , she sent transmission , she is very lighthead , you took 4 diltiazem (CARDIZEM 730 [name] went from 150  To 40 and fluctuated all day , hr was 38 this morning , she is having trouble sitting up   5. Have you passed out?  No     Please route to [place]

## 2017-07-22 ENCOUNTER — Telehealth: Payer: Self-pay | Admitting: *Deleted

## 2017-07-22 NOTE — Telephone Encounter (Signed)
Spoke with patient regarding symptom+detected episodes. Patient states she has been dizzy off and on for the past couple of days and the worst day was 07/20/17. She states she has been taking her Diltiazem 180 daily + Diltiazem PRN which has helped. Advised the patient per AT/AF trend on LINQ report her burden and Avg V rates have decreased since 11/13. Advised patient the information was routed to Dr. Rayann Heman and will call once further recommendations are given. Also, advised patient to call the AFib clinic if symptoms are persistent or worsen. Patient verbalized understanding and appreciation.

## 2017-07-23 ENCOUNTER — Telehealth: Payer: Self-pay | Admitting: *Deleted

## 2017-07-23 NOTE — Telephone Encounter (Signed)
Spoke with patient, requested manual Carelink transmission for review of any additional tachy episodes that haven't transmitted automatically.  Explained rationale to patient and she agrees to send a transmission for review later today.

## 2017-07-26 NOTE — Telephone Encounter (Signed)
Spoke with patient.  She is agreeable to scheduling an appointment with Roderic Palau, NP, on 07/28/17 at 10:30am.  Patient is aware of the parking garage code and of the directions to the office.  She is appreciative and denies questions or concerns at this time.

## 2017-07-26 NOTE — Telephone Encounter (Signed)
Persistent A-fib noted over the weekend.  Presenting rhythm this morning was still A-fib.  Dr. Rayann Heman reviewed LINQ data and recommended f/u in the A-fib clinic.  [facility] requesting call back from patient, gave [facility] number.  Will advise of Dr. Jackalyn Lombard recommendations.

## 2017-07-28 ENCOUNTER — Ambulatory Visit (HOSPITAL_COMMUNITY)
Admission: RE | Admit: 2017-07-28 | Discharge: 2017-07-28 | Disposition: A | Discharge status: Home / Self Care | Payer: Medicare Other | Source: Ambulatory Visit | Attending: Nurse Practitioner | Admitting: Nurse Practitioner

## 2017-07-28 ENCOUNTER — Encounter (HOSPITAL_COMMUNITY): Payer: Self-pay | Admitting: Nurse Practitioner

## 2017-07-28 VITALS — BP 132/80 | HR 87 | Ht 65.0 in | Wt 177.0 lb

## 2017-07-28 DIAGNOSIS — N183 Chronic kidney disease, stage 3 (moderate): Secondary | ICD-10-CM | POA: Insufficient documentation

## 2017-07-28 DIAGNOSIS — Z7901 Long term (current) use of anticoagulants: Secondary | ICD-10-CM | POA: Insufficient documentation

## 2017-07-28 DIAGNOSIS — I480 Paroxysmal atrial fibrillation: Secondary | ICD-10-CM | POA: Insufficient documentation

## 2017-07-28 DIAGNOSIS — E039 Hypothyroidism, unspecified: Secondary | ICD-10-CM | POA: Insufficient documentation

## 2017-07-28 DIAGNOSIS — I129 Hypertensive chronic kidney disease with stage 1 through stage 4 chronic kidney disease, or unspecified chronic kidney disease: Secondary | ICD-10-CM | POA: Diagnosis not present

## 2017-07-28 LAB — PROTIME-INR
INR: 2.94
PROTHROMBIN TIME: 30.4 s — AB (ref 11.4–15.2)

## 2017-07-28 MED ORDER — DILTIAZEM HCL ER COATED BEADS 120 MG PO CP24: 120.0000 mg | ORAL_CAPSULE | Freq: Every day | ORAL | 3 refills | Status: DC

## 2017-07-28 NOTE — Progress Notes (Signed)
Primary Care Physician: Lajean Manes, MD Referring Physician: Dr. Marva Panda is a 83 y.o. female with a h/o aib s/p ablation 11/2015. She has been doing very well until she developed afib since 11/19 and it had been persistent. She has been taking an extra 30 mg Cardizem every 4 hours since then but remains in afib today with v rates controlled. No obvious trigger.   Today, she denies symptoms of palpitations, chest pain, shortness of breath, orthopnea, PND, lower extremity edema, dizziness, presyncope, syncope, or neurologic sequela. The patient is tolerating medications without difficulties and is otherwise without complaint today.   Past Medical History:  Diagnosis Date  . Arthritis    hands, back  . Chronic kidney disease    stage 3 renal disease - no med  . Dysrhythmia    Hx - a-fib 05/2010 - tx with meds, no problem since 05/2010  . Hearing loss    bilateral hearing aids  . Hypertension    controlled with meds  . Hypothyroidism   . Internal hemorrhoid   . Paroxysmal atrial fibrillation (HCC)   . Peripheral vascular disease ([place])    right arm and right shoulder blood clots r/t a fall  . Rectal bleeding   . SVD (spontaneous vaginal delivery) 1957, 1959   x 2  . Thyroid disease    hypothyroidism   Past Surgical History:  Procedure Laterality Date  . ABDOMINAL HYSTERECTOMY     and right ovary  . ANTERIOR AND POSTERIOR REPAIR N/A 01/03/2014   Procedure: ANTERIOR (CYSTOCELE) REPAIR;  Surgeon: Floyce Stakes. Pamala Hurry, MD;  Location: [facility];  Service: Gynecology;  Laterality: N/A;  . APPENDECTOMY    . BACK SURGERY     x 3 - 2 rods and 8 screws  . COLONOSCOPY    . CYSTOCELE REPAIR    . DENTAL SURGERY     infected tooth - general  . DILATION AND CURETTAGE OF UTERUS     x several  . ELECTROPHYSIOLOGIC STUDY N/A 11/28/2015   Procedure: Atrial Fibrillation Ablation;  Surgeon: [name], MD;  Location: [facility];  Service: Cardiovascular;  Laterality:  N/A;  . EP IMPLANTABLE DEVICE N/A 06/08/2016   Procedure: Loop Recorder Insertion;  Surgeon: [name], MD;  Location: [facility];  Service: Cardiovascular;  Laterality: N/A;  . EXAMINATION UNDER ANESTHESIA N/A 01/15/2014   Procedure: EXAM UNDER ANESTHESIA with evacuation of hematoma and over sewing of vaginal mucosa.;  Surgeon: Floyce Stakes. Pamala Hurry, MD;  Location: [facility];  Service: Gynecology;  Laterality: N/A;  . HAND SURGERY     left  . hemorrhoid injection  04/2011  . JOINT REPLACEMENT     left thumb replacement  . TONSILLECTOMY      Current Outpatient Medications  Medication Sig Dispense Refill  . Calcium Carbonate-Vitamin D (CALCIUM-VITAMIN D) 500-200 MG-UNIT per tablet Take 1 tablet by mouth daily at 12 noon. Reported on 10/29/2015    . diltiazem (CARDIZEM CD) 180 MG 24 hr capsule Take 1 capsule (180 mg total) by mouth daily.    Marland Kitchen diltiazem (CARDIZEM) 30 MG tablet Take 1 tablet by mouth every 4 hours AS NEEDED for fast heart rate >100 as long as blood pressure >100.    . hydrochlorothiazide (MICROZIDE) 12.5 MG capsule Take 12.5 mg by mouth daily.    Marland Kitchen levothyroxine (SYNTHROID, LEVOTHROID) 75 MCG tablet Take 75 mcg by mouth daily.      . Probiotic Product (PROBIOTIC PO) Take  1 tablet by mouth daily after lunch.     . psyllium (METAMUCIL) 58.6 % powder Take 1 packet by mouth daily.    . ramipril (ALTACE) 10 MG tablet Take 10 mg by mouth daily.      Marland Kitchen warfarin (COUMADIN) 1 MG tablet Take 2 mg by mouth as directed. Current dose is alternating 3 mg-3mg -2mg  cyclically    . warfarin (COUMADIN) 3 MG tablet Take 3 mg by mouth as directed. Current dose is alternating 3 mg-3mg -2mg  cyclically    . diltiazem (CARDIZEM CD) 120 MG 24 hr capsule Take 1 capsule (120 mg total) by mouth at bedtime. 30 capsule 3   No current facility-administered medications for this encounter.     Allergies  Allergen Reactions  . Pneumovax 23 [Pneumococcal Vac Polyvalent] Other (See Comments)    Arm swelling  & fever  . Codeine Nausea And Vomiting  . Cephalexin Anxiety  . Sudafed [Pseudoephedrine Hcl] Anxiety  . Sulfa Antibiotics Rash    Social History   Socioeconomic History  . Marital status: Married    Spouse name: Not on file  . Number of children: Not on file  . Years of education: Not on file  . Highest education level: Not on file  Social Needs  . Financial resource strain: Not on file  . Food insecurity - worry: Not on file  . Food insecurity - inability: Not on file  . Transportation needs - medical: Not on file  . Transportation needs - non-medical: Not on file  Occupational History  . Not on file  Tobacco Use  . Smoking status: Never Smoker  . Smokeless tobacco: Never Used  Substance and Sexual Activity  . Alcohol use: No  . Drug use: No  . Sexual activity: Not Currently    Birth control/protection: Post-menopausal  Other Topics Concern  . Not on file  Social History Narrative  . Not on file    Family History  Problem Relation Age of Onset  . Heart disease Mother   . Heart attack Mother   . Hypertension Father   . Diabetes Father   . Stroke Father   . Heart disease Sister   . Hypertension Sister   . Heart disease Brother   . Sudden death Maternal Grandfather   . Heart attack Sister   . Heart failure Brother     ROS- All systems are reviewed and negative except as per the HPI above  Physical Exam: Vitals:   07/28/17 1033  BP: 132/80  Pulse: 87  Weight: 177 lb (80.3 kg)  Height: 5\' 5"  (1.651 m)   Wt Readings from Last 3 Encounters:  07/28/17 177 lb (80.3 kg)  05/03/17 176 lb 6.4 oz (80 kg)  01/25/17 172 lb 12.8 oz (78.4 kg)    Labs: Lab Results  Component Value Date   NA 140 11/14/2015   K 4.1 11/14/2015   CL 102 11/14/2015   CO2 27 11/14/2015   GLUCOSE 92 11/14/2015   BUN 16 11/14/2015   CREATININE 0.93 (H) 11/14/2015   CALCIUM 9.7 11/14/2015   Lab Results  Component Value Date   INR 2.28 (H) 11/29/2015   No results found for:  CHOL, HDL, LDLCALC, TRIG   GEN- The patient is well appearing, alert and oriented x 3 today.   Head- normocephalic, atraumatic Eyes-  Sclera clear, conjunctiva pink Ears- hearing intact Oropharynx- clear Neck- supple, no JVP Lymph- no cervical lymphadenopathy Lungs- Clear to ausculation bilaterally, normal work of breathing Heart- irregular rate  and rhythm, no murmurs, rubs or gallops, PMI not laterally displaced GI- soft, NT, ND, + BS Extremities- no clubbing, cyanosis, or edema MS- no significant deformity or atrophy Skin- no rash or lesion Psych- euthymic mood, full affect Neuro- strength and sensation are intact  EKG-afib at 87 bpm, qrs int 82 ms, qtc 399 ms Epic records reviewed    Assessment and Plan: 1. Afib S/p ablation Present since 11/19 No triggers identified Discussed options to restore SR Will try increasing Cardizem to an extra 120 mg in the pm, do not take extra 30 mg as needed with extra daily dose, will continue 180 mg am  She is on warfarin and will need 4 weekly INR's vrs TEE guided DCCV, chadsvasc score of least 4 She is opting for the weekly INR's I will draw one today and she has one scheduled for Tuesday the 27, another one for the 4th and I will see her later that week to get set up for cardioversion if all INR's have been therapeutic.  Geroge Baseman Aaliayah Miao, [facility] [address] [place], [place] [address] [phone]

## 2017-07-28 NOTE — Patient Instructions (Signed)
Have INR drawn on 11/27 and 12/4 -  Have the results faxed to [phone]  Your physician has recommended you make the following change in your medication: 1)Continue cardizem 180mg  in the morning ----- ADD cardizem 120mg  at bedtime.

## 2017-08-02 ENCOUNTER — Ambulatory Visit (INDEPENDENT_AMBULATORY_CARE_PROVIDER_SITE_OTHER): Payer: Medicare Other | Admitting: *Deleted

## 2017-08-02 DIAGNOSIS — I480 Paroxysmal atrial fibrillation: Secondary | ICD-10-CM

## 2017-08-03 DIAGNOSIS — Z7901 Long term (current) use of anticoagulants: Secondary | ICD-10-CM | POA: Diagnosis not present

## 2017-08-03 NOTE — Progress Notes (Signed)
Carelink Summary Report / Loop Recorder 

## 2017-08-04 ENCOUNTER — Other Ambulatory Visit: Payer: Self-pay | Admitting: Internal Medicine

## 2017-08-10 DIAGNOSIS — Z7901 Long term (current) use of anticoagulants: Secondary | ICD-10-CM | POA: Diagnosis not present

## 2017-08-11 ENCOUNTER — Ambulatory Visit (HOSPITAL_COMMUNITY)
Admission: RE | Admit: 2017-08-11 | Discharge: 2017-08-11 | Disposition: A | Discharge status: Home / Self Care | Payer: Medicare Other | Source: Ambulatory Visit | Attending: Nurse Practitioner | Admitting: Nurse Practitioner

## 2017-08-11 ENCOUNTER — Encounter (HOSPITAL_COMMUNITY): Payer: Self-pay | Admitting: Nurse Practitioner

## 2017-08-11 VITALS — BP 126/72 | HR 85 | Ht 65.0 in | Wt 177.0 lb

## 2017-08-11 DIAGNOSIS — I4891 Unspecified atrial fibrillation: Secondary | ICD-10-CM | POA: Diagnosis present

## 2017-08-11 DIAGNOSIS — Z7901 Long term (current) use of anticoagulants: Secondary | ICD-10-CM | POA: Insufficient documentation

## 2017-08-11 DIAGNOSIS — I481 Persistent atrial fibrillation: Secondary | ICD-10-CM | POA: Diagnosis not present

## 2017-08-11 DIAGNOSIS — I4819 Other persistent atrial fibrillation: Secondary | ICD-10-CM

## 2017-08-11 DIAGNOSIS — E039 Hypothyroidism, unspecified: Secondary | ICD-10-CM | POA: Diagnosis not present

## 2017-08-11 DIAGNOSIS — Z79899 Other long term (current) drug therapy: Secondary | ICD-10-CM | POA: Insufficient documentation

## 2017-08-11 DIAGNOSIS — I480 Paroxysmal atrial fibrillation: Secondary | ICD-10-CM | POA: Diagnosis not present

## 2017-08-11 DIAGNOSIS — I739 Peripheral vascular disease, unspecified: Secondary | ICD-10-CM | POA: Insufficient documentation

## 2017-08-11 DIAGNOSIS — I129 Hypertensive chronic kidney disease with stage 1 through stage 4 chronic kidney disease, or unspecified chronic kidney disease: Secondary | ICD-10-CM | POA: Diagnosis not present

## 2017-08-11 DIAGNOSIS — N183 Chronic kidney disease, stage 3 (moderate): Secondary | ICD-10-CM | POA: Diagnosis not present

## 2017-08-11 DIAGNOSIS — Z9889 Other specified postprocedural states: Secondary | ICD-10-CM | POA: Insufficient documentation

## 2017-08-11 DIAGNOSIS — Z79890 Hormone replacement therapy (postmenopausal): Secondary | ICD-10-CM | POA: Insufficient documentation

## 2017-08-11 NOTE — Patient Instructions (Signed)
Cardioversion scheduled for Tuesday, December 18th  -Arrive at clinic at 11:30am  -Do not eat or drink anything after midnight the night prior to your procedure.  - Take all your medication with a sip of water prior to arrival.  - You will not be able to drive home after your procedure.

## 2017-08-11 NOTE — H&P (View-Only) (Signed)
Primary Care Physician: Frances Manes, MD Referring Physician: [name] is a 83 y.o. female with a h/o aib s/p ablation 11/2015. She has been doing very well until she developed afib since 11/19 and it had been persistent. She has been taking an extra 30 mg Cardizem every 4 hours since then but remains in afib today with v rates controlled. No obvious trigger. Discussed pursuing cardioversion but will need 4 weekly INR's. Pt would rather do this, than to have a TEE guided cardioversion.  Returns today, she continues in afib and has had 3 INR's, but will need faxed from PCP. Will plan on cardioversion after another weekly therapeutic INR.   Today, she denies symptoms of palpitations, chest pain, shortness of breath, orthopnea, PND, lower extremity edema, dizziness, presyncope, syncope, or neurologic sequela. The patient is tolerating medications without difficulties and is otherwise without complaint today.   Past Medical History:  Diagnosis Date  . Arthritis    hands, back  . Chronic kidney disease    stage 3 renal disease - no med  . Dysrhythmia    Hx - a-fib 05/2010 - tx with meds, no problem since 05/2010  . Hearing loss    bilateral hearing aids  . Hypertension    controlled with meds  . Hypothyroidism   . Internal hemorrhoid   . Paroxysmal atrial fibrillation (HCC)   . Peripheral vascular disease ([place])    right arm and right shoulder blood clots r/t a fall  . Rectal bleeding   . SVD (spontaneous vaginal delivery) 1957, 1959   x 2  . Thyroid disease    hypothyroidism   Past Surgical History:  Procedure Laterality Date  . ABDOMINAL HYSTERECTOMY     and right ovary  . ANTERIOR AND POSTERIOR REPAIR N/A 01/03/2014   Procedure: ANTERIOR (CYSTOCELE) REPAIR;  Surgeon: Floyce Stakes. Frances Hurry, MD;  Location: [facility];  Service: Gynecology;  Laterality: N/A;  . APPENDECTOMY    . BACK SURGERY     x 3 - 2 rods and 8 screws  . COLONOSCOPY    . CYSTOCELE REPAIR      . DENTAL SURGERY     infected tooth - general  . DILATION AND CURETTAGE OF UTERUS     x several  . ELECTROPHYSIOLOGIC STUDY N/A 11/28/2015   Procedure: Atrial Fibrillation Ablation;  Surgeon: [name], MD;  Location: [facility];  Service: Cardiovascular;  Laterality: N/A;  . EP IMPLANTABLE DEVICE N/A 06/08/2016   Procedure: Loop Recorder Insertion;  Surgeon: [name], MD;  Location: [facility];  Service: Cardiovascular;  Laterality: N/A;  . EXAMINATION UNDER ANESTHESIA N/A 01/15/2014   Procedure: EXAM UNDER ANESTHESIA with evacuation of hematoma and over sewing of vaginal mucosa.;  Surgeon: Floyce Stakes. Frances Hurry, MD;  Location: [facility];  Service: Gynecology;  Laterality: N/A;  . HAND SURGERY     left  . hemorrhoid injection  04/2011  . JOINT REPLACEMENT     left thumb replacement  . TONSILLECTOMY      Current Outpatient Medications  Medication Sig Dispense Refill  . Calcium Carbonate-Vitamin D (CALCIUM-VITAMIN D) 500-200 MG-UNIT per tablet Take 1 tablet by mouth daily at 12 noon. Reported on 10/29/2015    . diltiazem (CARDIZEM CD) 120 MG 24 hr capsule Take 1 capsule (120 mg total) by mouth at bedtime. 30 capsule 3  . diltiazem (CARDIZEM CD) 180 MG 24 hr capsule Take 1 capsule (180 mg total) by mouth daily.    Marland Kitchen  hydrochlorothiazide (MICROZIDE) 12.5 MG capsule Take 12.5 mg by mouth daily.    Marland Kitchen levothyroxine (SYNTHROID, LEVOTHROID) 75 MCG tablet Take 75 mcg by mouth daily.      . Probiotic Product (PROBIOTIC PO) Take 1 tablet by mouth daily after lunch.     . psyllium (METAMUCIL) 58.6 % powder Take 1 packet by mouth daily.    . ramipril (ALTACE) 10 MG tablet Take 10 mg by mouth daily.      Marland Kitchen warfarin (COUMADIN) 1 MG tablet Take 2 mg by mouth as directed. Current dose is alternating 3 mg-3mg -2mg  cyclically    . warfarin (COUMADIN) 3 MG tablet Take 3 mg by mouth as directed. Current dose is alternating 3 mg-3mg -2mg  cyclically    . diltiazem (CARDIZEM) 30 MG tablet Take 1  tablet by mouth every 4 hours AS NEEDED for fast heart rate >100 as long as blood pressure >100.     No current facility-administered medications for this encounter.     Allergies  Allergen Reactions  . Pneumovax 23 [Pneumococcal Vac Polyvalent] Other (See Comments)    Arm swelling & fever  . Codeine Nausea And Vomiting  . Cephalexin Anxiety  . Sudafed [Pseudoephedrine Hcl] Anxiety  . Sulfa Antibiotics Rash    Social History   Socioeconomic History  . Marital status: Married    Spouse name: Not on file  . Number of children: Not on file  . Years of education: Not on file  . Highest education level: Not on file  Social Needs  . Financial resource strain: Not on file  . Food insecurity - worry: Not on file  . Food insecurity - inability: Not on file  . Transportation needs - medical: Not on file  . Transportation needs - non-medical: Not on file  Occupational History  . Not on file  Tobacco Use  . Smoking status: Never Smoker  . Smokeless tobacco: Never Used  Substance and Sexual Activity  . Alcohol use: No  . Drug use: No  . Sexual activity: Not Currently    Birth control/protection: Post-menopausal  Other Topics Concern  . Not on file  Social History Narrative  . Not on file    Family History  Problem Relation Age of Onset  . Heart disease Mother   . Heart attack Mother   . Hypertension Father   . Diabetes Father   . Stroke Father   . Heart disease Sister   . Hypertension Sister   . Heart disease Brother   . Sudden death Maternal Grandfather   . Heart attack Sister   . Heart failure Brother     ROS- All systems are reviewed and negative except as per the HPI above  Physical Exam: Vitals:   08/11/17 1035  BP: 126/72  Pulse: 85  Weight: 177 lb (80.3 kg)  Height: 5\' 5"  (1.651 m)   Wt Readings from Last 3 Encounters:  08/11/17 177 lb (80.3 kg)  07/28/17 177 lb (80.3 kg)  05/03/17 176 lb 6.4 oz (80 kg)    Labs: Lab Results  Component Value Date    NA 140 11/14/2015   K 4.1 11/14/2015   CL 102 11/14/2015   CO2 27 11/14/2015   GLUCOSE 92 11/14/2015   BUN 16 11/14/2015   CREATININE 0.93 (H) 11/14/2015   CALCIUM 9.7 11/14/2015   Lab Results  Component Value Date   INR 2.94 07/28/2017   No results found for: CHOL, HDL, LDLCALC, TRIG   GEN- The patient is well appearing,  alert and oriented x 3 today.   Head- normocephalic, atraumatic Eyes-  Sclera clear, conjunctiva pink Ears- hearing intact Oropharynx- clear Neck- supple, no JVP Lymph- no cervical lymphadenopathy Lungs- Clear to ausculation bilaterally, normal work of breathing Heart- irregular rate and rhythm, no murmurs, rubs or gallops, PMI not laterally displaced GI- soft, NT, ND, + BS Extremities- no clubbing, cyanosis, or edema MS- no significant deformity or atrophy Skin- no rash or lesion Psych- euthymic mood, full affect Neuro- strength and sensation are intact  EKG-afib at 85 bpm, qrs int 80 ms, qtc 459 ms Epic records reviewed    Assessment and Plan: 1. Afib S/p ablation Return of afib present since 11/19 No triggers identified Discussed options to restore SR At this point she would like to avoid antiarrythmic's. Has used flecainide in the past without maintaining SR Cardizem 120 mg in the pm, do not take extra 30 mg as needed with extra daily dose, will continue 180 mg am  She is on warfarin and will need 4 weekly INR's, chadsvasc score of least 4, has had 3 therapeutic and will have another INR at PCP office 12/11, she will have faxed over.  Cardioversion planned for 12/18, will have Linq interrogated am of cardioversion as well as cbc/bmet/Inr  Addendum: 12/18- pt here for labs for cardioversion pending today. I will not interrogate pt as her last [facility] report is available, which was updated 12/15, and shows persistent afib since 12/05. Ekg shows afib toda  at 99 bpm.  INR's have been therapeutic, checked at PCP office, with 2.94- 11/21, 3.3-  11/27, 2.3- 12/4, 2.0 12/13 and INR is pending today with cbc and bmet. She is proceed to endo for cardioversion at [place]. Jeffie Spivack, [facility] [address] [place], [place] [address] [phone]

## 2017-08-11 NOTE — Progress Notes (Addendum)
Primary Care Physician: Lajean Manes, MD Referring Physician: Dr. Marva Panda is a 83 y.o. female with a h/o aib s/p ablation 11/2015. She has been doing very well until she developed afib since 11/19 and it had been persistent. She has been taking an extra 30 mg Cardizem every 4 hours since then but remains in afib today with v rates controlled. No obvious trigger. Discussed pursuing cardioversion but will need 4 weekly INR's. Pt would rather do this, than to have a TEE guided cardioversion.  Returns today, she continues in afib and has had 3 INR's, but will need faxed from PCP. Will plan on cardioversion after another weekly therapeutic INR.   Today, she denies symptoms of palpitations, chest pain, shortness of breath, orthopnea, PND, lower extremity edema, dizziness, presyncope, syncope, or neurologic sequela. The patient is tolerating medications without difficulties and is otherwise without complaint today.   Past Medical History:  Diagnosis Date  . Arthritis    hands, back  . Chronic kidney disease    stage 3 renal disease - no med  . Dysrhythmia    Hx - a-fib 05/2010 - tx with meds, no problem since 05/2010  . Hearing loss    bilateral hearing aids  . Hypertension    controlled with meds  . Hypothyroidism   . Internal hemorrhoid   . Paroxysmal atrial fibrillation (HCC)   . Peripheral vascular disease ([place])    right arm and right shoulder blood clots r/t a fall  . Rectal bleeding   . SVD (spontaneous vaginal delivery) 1957, 1959   x 2  . Thyroid disease    hypothyroidism   Past Surgical History:  Procedure Laterality Date  . ABDOMINAL HYSTERECTOMY     and right ovary  . ANTERIOR AND POSTERIOR REPAIR N/A 01/03/2014   Procedure: ANTERIOR (CYSTOCELE) REPAIR;  Surgeon: Floyce Stakes. Pamala Hurry, MD;  Location: [facility];  Service: Gynecology;  Laterality: N/A;  . APPENDECTOMY    . BACK SURGERY     x 3 - 2 rods and 8 screws  . COLONOSCOPY    . CYSTOCELE REPAIR      . DENTAL SURGERY     infected tooth - general  . DILATION AND CURETTAGE OF UTERUS     x several  . ELECTROPHYSIOLOGIC STUDY N/A 11/28/2015   Procedure: Atrial Fibrillation Ablation;  Surgeon: [name], MD;  Location: [facility];  Service: Cardiovascular;  Laterality: N/A;  . EP IMPLANTABLE DEVICE N/A 06/08/2016   Procedure: Loop Recorder Insertion;  Surgeon: [name], MD;  Location: [facility];  Service: Cardiovascular;  Laterality: N/A;  . EXAMINATION UNDER ANESTHESIA N/A 01/15/2014   Procedure: EXAM UNDER ANESTHESIA with evacuation of hematoma and over sewing of vaginal mucosa.;  Surgeon: Floyce Stakes. Pamala Hurry, MD;  Location: [facility];  Service: Gynecology;  Laterality: N/A;  . HAND SURGERY     left  . hemorrhoid injection  04/2011  . JOINT REPLACEMENT     left thumb replacement  . TONSILLECTOMY      Current Outpatient Medications  Medication Sig Dispense Refill  . Calcium Carbonate-Vitamin D (CALCIUM-VITAMIN D) 500-200 MG-UNIT per tablet Take 1 tablet by mouth daily at 12 noon. Reported on 10/29/2015    . diltiazem (CARDIZEM CD) 120 MG 24 hr capsule Take 1 capsule (120 mg total) by mouth at bedtime. 30 capsule 3  . diltiazem (CARDIZEM CD) 180 MG 24 hr capsule Take 1 capsule (180 mg total) by mouth daily.    Marland Kitchen  hydrochlorothiazide (MICROZIDE) 12.5 MG capsule Take 12.5 mg by mouth daily.    Marland Kitchen levothyroxine (SYNTHROID, LEVOTHROID) 75 MCG tablet Take 75 mcg by mouth daily.      . Probiotic Product (PROBIOTIC PO) Take 1 tablet by mouth daily after lunch.     . psyllium (METAMUCIL) 58.6 % powder Take 1 packet by mouth daily.    . ramipril (ALTACE) 10 MG tablet Take 10 mg by mouth daily.      Marland Kitchen warfarin (COUMADIN) 1 MG tablet Take 2 mg by mouth as directed. Current dose is alternating 3 mg-3mg -2mg  cyclically    . warfarin (COUMADIN) 3 MG tablet Take 3 mg by mouth as directed. Current dose is alternating 3 mg-3mg -2mg  cyclically    . diltiazem (CARDIZEM) 30 MG tablet Take 1  tablet by mouth every 4 hours AS NEEDED for fast heart rate >100 as long as blood pressure >100.     No current facility-administered medications for this encounter.     Allergies  Allergen Reactions  . Pneumovax 23 [Pneumococcal Vac Polyvalent] Other (See Comments)    Arm swelling & fever  . Codeine Nausea And Vomiting  . Cephalexin Anxiety  . Sudafed [Pseudoephedrine Hcl] Anxiety  . Sulfa Antibiotics Rash    Social History   Socioeconomic History  . Marital status: Married    Spouse name: Not on file  . Number of children: Not on file  . Years of education: Not on file  . Highest education level: Not on file  Social Needs  . Financial resource strain: Not on file  . Food insecurity - worry: Not on file  . Food insecurity - inability: Not on file  . Transportation needs - medical: Not on file  . Transportation needs - non-medical: Not on file  Occupational History  . Not on file  Tobacco Use  . Smoking status: Never Smoker  . Smokeless tobacco: Never Used  Substance and Sexual Activity  . Alcohol use: No  . Drug use: No  . Sexual activity: Not Currently    Birth control/protection: Post-menopausal  Other Topics Concern  . Not on file  Social History Narrative  . Not on file    Family History  Problem Relation Age of Onset  . Heart disease Mother   . Heart attack Mother   . Hypertension Father   . Diabetes Father   . Stroke Father   . Heart disease Sister   . Hypertension Sister   . Heart disease Brother   . Sudden death Maternal Grandfather   . Heart attack Sister   . Heart failure Brother     ROS- All systems are reviewed and negative except as per the HPI above  Physical Exam: Vitals:   08/11/17 1035  BP: 126/72  Pulse: 85  Weight: 177 lb (80.3 kg)  Height: 5\' 5"  (1.651 m)   Wt Readings from Last 3 Encounters:  08/11/17 177 lb (80.3 kg)  07/28/17 177 lb (80.3 kg)  05/03/17 176 lb 6.4 oz (80 kg)    Labs: Lab Results  Component Value Date    NA 140 11/14/2015   K 4.1 11/14/2015   CL 102 11/14/2015   CO2 27 11/14/2015   GLUCOSE 92 11/14/2015   BUN 16 11/14/2015   CREATININE 0.93 (H) 11/14/2015   CALCIUM 9.7 11/14/2015   Lab Results  Component Value Date   INR 2.94 07/28/2017   No results found for: CHOL, HDL, LDLCALC, TRIG   GEN- The patient is well appearing,  alert and oriented x 3 today.   Head- normocephalic, atraumatic Eyes-  Sclera clear, conjunctiva pink Ears- hearing intact Oropharynx- clear Neck- supple, no JVP Lymph- no cervical lymphadenopathy Lungs- Clear to ausculation bilaterally, normal work of breathing Heart- irregular rate and rhythm, no murmurs, rubs or gallops, PMI not laterally displaced GI- soft, NT, ND, + BS Extremities- no clubbing, cyanosis, or edema MS- no significant deformity or atrophy Skin- no rash or lesion Psych- euthymic mood, full affect Neuro- strength and sensation are intact  EKG-afib at 85 bpm, qrs int 80 ms, qtc 459 ms Epic records reviewed    Assessment and Plan: 1. Afib S/p ablation Return of afib present since 11/19 No triggers identified Discussed options to restore SR At this point she would like to avoid antiarrythmic's. Has used flecainide in the past without maintaining SR Cardizem 120 mg in the pm, do not take extra 30 mg as needed with extra daily dose, will continue 180 mg am  She is on warfarin and will need 4 weekly INR's, chadsvasc score of least 4, has had 3 therapeutic and will have another INR at PCP office 12/11, she will have faxed over.  Cardioversion planned for 12/18, will have Linq interrogated am of cardioversion as well as cbc/bmet/Inr  Addendum: 12/18- pt here for labs for cardioversion pending today. I will not interrogate pt as her last [facility] report is available, which was updated 12/15, and shows persistent afib since 12/05. Ekg shows afib toda  at 99 bpm.  INR's have been therapeutic, checked at PCP office, with 2.94- 11/21, 3.3-  11/27, 2.3- 12/4, 2.0 12/13 and INR is pending today with cbc and bmet. She is proceed to endo for cardioversion at [place]. Alisha Burgo, [facility] [address] [place],  [address] [phone]

## 2017-08-17 LAB — CUP PACEART REMOTE DEVICE CHECK
Date Time Interrogation Session: 20181126211712
Implantable Pulse Generator Implant Date: 20171002

## 2017-08-19 DIAGNOSIS — Z7901 Long term (current) use of anticoagulants: Secondary | ICD-10-CM | POA: Diagnosis not present

## 2017-08-20 DIAGNOSIS — M25552 Pain in left hip: Secondary | ICD-10-CM | POA: Diagnosis not present

## 2017-08-20 DIAGNOSIS — M1612 Unilateral primary osteoarthritis, left hip: Secondary | ICD-10-CM | POA: Diagnosis not present

## 2017-08-24 ENCOUNTER — Encounter (HOSPITAL_COMMUNITY): Payer: Self-pay | Admitting: Emergency Medicine

## 2017-08-24 ENCOUNTER — Ambulatory Visit (HOSPITAL_COMMUNITY): Payer: Medicare Other | Admitting: Anesthesiology

## 2017-08-24 ENCOUNTER — Encounter (HOSPITAL_COMMUNITY)
Admission: RE | Disposition: A | Discharge status: Home / Self Care | Payer: Self-pay | Source: Ambulatory Visit | Attending: Cardiology

## 2017-08-24 ENCOUNTER — Ambulatory Visit (HOSPITAL_COMMUNITY)
Admission: RE | Admit: 2017-08-24 | Discharge: 2017-08-24 | Disposition: A | Discharge status: Home / Self Care | Payer: Medicare Other | Source: Ambulatory Visit | Attending: Nurse Practitioner | Admitting: Nurse Practitioner

## 2017-08-24 ENCOUNTER — Other Ambulatory Visit: Payer: Self-pay

## 2017-08-24 ENCOUNTER — Telehealth (HOSPITAL_COMMUNITY): Payer: Self-pay | Admitting: *Deleted

## 2017-08-24 ENCOUNTER — Ambulatory Visit (HOSPITAL_COMMUNITY)
Admission: RE | Admit: 2017-08-24 | Discharge: 2017-08-24 | Disposition: A | Discharge status: Home / Self Care | Payer: Medicare Other | Source: Ambulatory Visit | Attending: Cardiology | Admitting: Cardiology

## 2017-08-24 DIAGNOSIS — I481 Persistent atrial fibrillation: Secondary | ICD-10-CM | POA: Insufficient documentation

## 2017-08-24 DIAGNOSIS — Z7901 Long term (current) use of anticoagulants: Secondary | ICD-10-CM | POA: Insufficient documentation

## 2017-08-24 DIAGNOSIS — I480 Paroxysmal atrial fibrillation: Secondary | ICD-10-CM | POA: Diagnosis not present

## 2017-08-24 DIAGNOSIS — I739 Peripheral vascular disease, unspecified: Secondary | ICD-10-CM | POA: Diagnosis not present

## 2017-08-24 DIAGNOSIS — Z96692 Finger-joint replacement of left hand: Secondary | ICD-10-CM | POA: Insufficient documentation

## 2017-08-24 DIAGNOSIS — I129 Hypertensive chronic kidney disease with stage 1 through stage 4 chronic kidney disease, or unspecified chronic kidney disease: Secondary | ICD-10-CM | POA: Insufficient documentation

## 2017-08-24 DIAGNOSIS — N183 Chronic kidney disease, stage 3 (moderate): Secondary | ICD-10-CM | POA: Diagnosis not present

## 2017-08-24 DIAGNOSIS — H9193 Unspecified hearing loss, bilateral: Secondary | ICD-10-CM | POA: Insufficient documentation

## 2017-08-24 DIAGNOSIS — E039 Hypothyroidism, unspecified: Secondary | ICD-10-CM | POA: Diagnosis not present

## 2017-08-24 DIAGNOSIS — Z79899 Other long term (current) drug therapy: Secondary | ICD-10-CM | POA: Insufficient documentation

## 2017-08-24 DIAGNOSIS — M1990 Unspecified osteoarthritis, unspecified site: Secondary | ICD-10-CM | POA: Diagnosis not present

## 2017-08-24 DIAGNOSIS — I4891 Unspecified atrial fibrillation: Secondary | ICD-10-CM | POA: Diagnosis not present

## 2017-08-24 DIAGNOSIS — I10 Essential (primary) hypertension: Secondary | ICD-10-CM | POA: Diagnosis not present

## 2017-08-24 HISTORY — PX: CARDIOVERSION: SHX1299

## 2017-08-24 LAB — BASIC METABOLIC PANEL
ANION GAP: 8 (ref 5–15)
BUN: 15 mg/dL (ref 6–20)
CO2: 24 mmol/L (ref 22–32)
CREATININE: 0.96 mg/dL (ref 0.44–1.00)
Calcium: 9.5 mg/dL (ref 8.9–10.3)
Chloride: 107 mmol/L (ref 101–111)
GFR calc non Af Amer: 54 mL/min — ABNORMAL LOW (ref >60)
Glucose, Bld: 105 mg/dL — ABNORMAL HIGH (ref 65–99)
Potassium: 3.4 mmol/L — ABNORMAL LOW (ref 3.5–5.1)
SODIUM: 139 mmol/L (ref 135–145)

## 2017-08-24 LAB — CBC
HCT: 44.5 % (ref 36.0–46.0)
Hemoglobin: 14.7 g/dL (ref 12.0–15.0)
MCH: 27.7 pg (ref 26.0–34.0)
MCHC: 33 g/dL (ref 30.0–36.0)
MCV: 84 fL (ref 78.0–100.0)
Platelets: 248 10*3/uL (ref 150–400)
RBC: 5.3 MIL/uL — AB (ref 3.87–5.11)
RDW: 14.9 % (ref 11.5–15.5)
WBC: 6.5 10*3/uL (ref 4.0–10.5)

## 2017-08-24 LAB — PROTIME-INR
INR: 2.05
PROTHROMBIN TIME: 22.9 s — AB (ref 11.4–15.2)

## 2017-08-24 SURGERY — CARDIOVERSION
Anesthesia: General

## 2017-08-24 MED ORDER — PROPOFOL 10 MG/ML IV BOLUS
INTRAVENOUS | Status: DC | PRN
  Administered 2017-08-24: 80 mg via INTRAVENOUS

## 2017-08-24 MED ORDER — SODIUM CHLORIDE 0.9 % IV SOLN
INTRAVENOUS | Status: DC | PRN
  Administered 2017-08-24: 13:00:00 via INTRAVENOUS

## 2017-08-24 MED ORDER — LIDOCAINE 2% (20 MG/ML) 5 ML SYRINGE
INTRAMUSCULAR | Status: DC | PRN
  Administered 2017-08-24: 60 mg via INTRAVENOUS

## 2017-08-24 NOTE — Interval H&P Note (Signed)
History and Physical Interval Note:  08/24/2017 1:39 PM  Frances [name]  has presented today for surgery, with the diagnosis of AFIB  The various methods of treatment have been discussed with the patient and family. After consideration of risks, benefits and other options for treatment, the patient has consented to  Procedure(s): CARDIOVERSION (N/A) as a surgical intervention .  The patient's history has been reviewed, patient examined, no change in status, stable for surgery.  I have reviewed the patient's chart and labs.  Questions were answered to the patient's satisfaction.     [employer]

## 2017-08-24 NOTE — Telephone Encounter (Addendum)
Entered in error

## 2017-08-24 NOTE — Progress Notes (Signed)
Pt in for pre dccv labs and EKG to be reviewed by Ceasar Lund.

## 2017-08-24 NOTE — CV Procedure (Signed)
    Electrical Cardioversion Procedure Note Frances [name] [id number] [birth year]  Procedure: Electrical Cardioversion Indications:  Atrial Fibrillation  Time Out: Verified patient identification, verified procedure,medications/allergies/relevent history reviewed, required imaging and test results available.  Performed  Procedure Details  The patient was NPO after midnight. Anesthesia was administered at the beside  by Dr. Therisa Doyne with 90mg  of propofol.  Cardioversion was performed with synchronized biphasic defibrillation via AP pads with 120 joules.  1 attempt(s) were performed.  The patient converted to normal sinus rhythm. The patient tolerated the procedure well   IMPRESSION:  Successful cardioversion of atrial fibrillation    Frances [name] 08/24/2017, 1:55 PM

## 2017-08-24 NOTE — Addendum Note (Signed)
Encounter addended by: Sherran Needs, NP on: 08/24/2017 11:43 AM  Actions taken: Sign clinical note

## 2017-08-24 NOTE — Transfer of Care (Signed)
Immediate Anesthesia Transfer of Care Note  Patient: Frances [name]  Procedure(s) Performed: CARDIOVERSION (N/A )  Patient Location: Endoscopy Unit  Anesthesia Type:General  Level of Consciousness: drowsy and patient cooperative  Airway & Oxygen Therapy: Patient Spontanous Breathing  Post-op Assessment: Report given to RN and Post -op Vital signs reviewed and stable  Post vital signs: Reviewed and stable  Last Vitals:  Vitals:   08/24/17 1243  BP: (!) 167/84  Pulse: 99  Resp: 17  Temp: 36.6 C  SpO2: 96%    Last Pain:  Vitals:   08/24/17 1243  TempSrc: Oral         Complications: No apparent anesthesia complications

## 2017-08-24 NOTE — Anesthesia Preprocedure Evaluation (Signed)
Anesthesia Evaluation  Patient identified by MRN, date of birth, ID band Patient awake    Reviewed: Allergy & Precautions, H&P , NPO status , Patient's Chart, lab work & pertinent test results  Airway Mallampati: II  TM Distance: >3 FB Neck ROM: Full    Dental no notable dental hx. (+) Teeth Intact, Dental Advisory Given   Pulmonary asthma ,    Pulmonary exam normal breath sounds clear to auscultation       Cardiovascular hypertension, Pt. on medications + dysrhythmias Atrial Fibrillation  Rhythm:Irregular Rate:Normal     Neuro/Psych negative neurological ROS  negative psych ROS   GI/Hepatic negative GI ROS, Neg liver ROS,   Endo/Other  Hypothyroidism   Renal/GU Renal InsufficiencyRenal disease  negative genitourinary   Musculoskeletal  (+) Arthritis , Osteoarthritis,    Abdominal   Peds  Hematology negative hematology ROS (+)   Anesthesia Other Findings   Reproductive/Obstetrics negative OB ROS                             Anesthesia Physical Anesthesia Plan  ASA: III  Anesthesia Plan: General   Post-op Pain Management:    Induction: Intravenous  PONV Risk Score and Plan: 3 and Treatment may vary due to age or medical condition  Airway Management Planned: Mask  Additional Equipment:   Intra-op Plan:   Post-operative Plan:   Informed Consent: I have reviewed the patients History and Physical, chart, labs and discussed the procedure including the risks, benefits and alternatives for the proposed anesthesia with the patient or authorized representative who has indicated his/her understanding and acceptance.   Dental advisory given  Plan Discussed with: CRNA  Anesthesia Plan Comments:         Anesthesia Quick Evaluation

## 2017-08-24 NOTE — Anesthesia Procedure Notes (Signed)
Procedure Name: General with mask airway Date/Time: 08/24/2017 1:41 PM Performed by: Orlie Dakin, CRNA Pre-anesthesia Checklist: Patient identified, Emergency Drugs available, Suction available, Patient being monitored and Timeout performed Patient Re-evaluated:Patient Re-evaluated prior to induction Oxygen Delivery Method: Ambu bag Preoxygenation: Pre-oxygenation with 100% oxygen Induction Type: IV induction

## 2017-08-24 NOTE — Discharge Instructions (Signed)
Electrical Cardioversion, Care After °This sheet gives you information about how to care for yourself after your procedure. Your health care provider may also give you more specific instructions. If you have problems or questions, contact your health care provider. °What can I expect after the procedure? °After the procedure, it is common to have: °· Some redness on the skin where the shocks were given. ° °Follow these instructions at home: °· Do not drive for 24 hours if you were given a medicine to help you relax (sedative). °· Take over-the-counter and prescription medicines only as told by your health care provider. °· Ask your health care provider how to check your pulse. Check it often. °· Rest for 48 hours after the procedure or as told by your health care provider. °· Avoid or limit your caffeine use as told by your health care provider. °Contact a health care provider if: °· You feel like your heart is beating too quickly or your pulse is not regular. °· You have a serious muscle cramp that does not go away. °Get help right away if: °· You have discomfort in your chest. °· You are dizzy or you feel faint. °· You have trouble breathing or you are short of breath. °· Your speech is slurred. °· You have trouble moving an arm or leg on one side of your body. °· Your fingers or toes turn cold or blue. °This information is not intended to replace advice given to you by your health care provider. Make sure you discuss any questions you have with your health care provider. °Document Released: 06/14/2013 Document Revised: 03/27/2016 Document Reviewed: 02/28/2016 °Elsevier Interactive Patient Education © 2018 [employer] ° °

## 2017-08-25 NOTE — Telephone Encounter (Signed)
Entered in Error

## 2017-08-25 NOTE — Anesthesia Postprocedure Evaluation (Signed)
Anesthesia Post Note  Patient: Frances [name]  Procedure(s) Performed: CARDIOVERSION (N/A )     Patient location during evaluation: PACU Anesthesia Type: General Level of consciousness: awake and alert Pain management: pain level controlled Vital Signs Assessment: post-procedure vital signs reviewed and stable Respiratory status: spontaneous breathing, nonlabored ventilation and respiratory function stable Cardiovascular status: blood pressure returned to baseline and stable Postop Assessment: no apparent nausea or vomiting Anesthetic complications: no    Last Vitals:  Vitals:   08/24/17 1243 08/24/17 1357  BP: (!) 167/84 (!) 143/73  Pulse: 99 79  Resp: 17 (!) 25  Temp: 36.6 C 36.6 C  SpO2: 96% 93%    Last Pain:  Vitals:   08/24/17 1243  TempSrc: Oral                 Jasten Guyette,W. EDMOND

## 2017-09-01 ENCOUNTER — Ambulatory Visit (INDEPENDENT_AMBULATORY_CARE_PROVIDER_SITE_OTHER): Payer: Medicare Other | Admitting: *Deleted

## 2017-09-01 DIAGNOSIS — I480 Paroxysmal atrial fibrillation: Secondary | ICD-10-CM | POA: Diagnosis not present

## 2017-09-02 NOTE — Progress Notes (Signed)
Carelink Summary Report / Loop Recorder 

## 2017-09-08 ENCOUNTER — Ambulatory Visit (HOSPITAL_COMMUNITY)
Admission: RE | Admit: 2017-09-08 | Discharge: 2017-09-08 | Disposition: A | Discharge status: Home / Self Care | Payer: Medicare Other | Source: Ambulatory Visit | Attending: Nurse Practitioner | Admitting: Nurse Practitioner

## 2017-09-08 VITALS — BP 164/82 | HR 92 | Ht 65.0 in | Wt 180.0 lb

## 2017-09-08 DIAGNOSIS — N183 Chronic kidney disease, stage 3 (moderate): Secondary | ICD-10-CM | POA: Diagnosis not present

## 2017-09-08 DIAGNOSIS — Z7901 Long term (current) use of anticoagulants: Secondary | ICD-10-CM | POA: Diagnosis not present

## 2017-09-08 DIAGNOSIS — I480 Paroxysmal atrial fibrillation: Secondary | ICD-10-CM | POA: Insufficient documentation

## 2017-09-08 DIAGNOSIS — I4819 Other persistent atrial fibrillation: Secondary | ICD-10-CM

## 2017-09-08 DIAGNOSIS — I739 Peripheral vascular disease, unspecified: Secondary | ICD-10-CM | POA: Insufficient documentation

## 2017-09-08 DIAGNOSIS — Z79899 Other long term (current) drug therapy: Secondary | ICD-10-CM | POA: Diagnosis not present

## 2017-09-08 DIAGNOSIS — I4891 Unspecified atrial fibrillation: Secondary | ICD-10-CM | POA: Diagnosis present

## 2017-09-08 DIAGNOSIS — E039 Hypothyroidism, unspecified: Secondary | ICD-10-CM | POA: Insufficient documentation

## 2017-09-08 DIAGNOSIS — I481 Persistent atrial fibrillation: Secondary | ICD-10-CM

## 2017-09-08 DIAGNOSIS — I129 Hypertensive chronic kidney disease with stage 1 through stage 4 chronic kidney disease, or unspecified chronic kidney disease: Secondary | ICD-10-CM | POA: Insufficient documentation

## 2017-09-08 NOTE — Progress Notes (Signed)
Primary Care Physician: Lajean Manes, MD Referring Physician: Dr. Marva Panda is a 84 y.o. female with a h/o afib,s/p ablation 11/2015. She had been on flecainide prior to ablation and was failing drug.She had been doing very well until she developed afib  11/19 and it had been persistent. She has been taking an extra 30 mg Cardizem every 4 hours since then but remained in afib today with v rates controlled. No obvious trigger. Discussed pursuing cardioversion but  needed 4 weekly INR's. Pt would rather do this, than to have a TEE guided cardioversion.  Pt had cardioversion 12/18 and enjoyed SR x 2-3 days, but then has been in afib since then. She is back into clinic to discuss options.Contiues on warfarin but is interested in working with coumadin clinic at [facility] office to transfer onto eliquis.  Today, she denies symptoms of palpitations, chest pain, shortness of breath, orthopnea, PND, lower extremity edema, dizziness, presyncope, syncope, or neurologic sequela. The patient is tolerating medications without difficulties and is otherwise without complaint today.   Past Medical History:  Diagnosis Date  . Arthritis    hands, back  . Chronic kidney disease    stage 3 renal disease - no med  . Dysrhythmia    Hx - a-fib 05/2010 - tx with meds, no problem since 05/2010  . Hearing loss    bilateral hearing aids  . Hypertension    controlled with meds  . Hypothyroidism   . Internal hemorrhoid   . Paroxysmal atrial fibrillation (HCC)   . Peripheral vascular disease ([place])    right arm and right shoulder blood clots r/t a fall  . Rectal bleeding   . SVD (spontaneous vaginal delivery) 1957, 1959   x 2  . Thyroid disease    hypothyroidism   Past Surgical History:  Procedure Laterality Date  . ABDOMINAL HYSTERECTOMY     and right ovary  . ANTERIOR AND POSTERIOR REPAIR N/A 01/03/2014   Procedure: ANTERIOR (CYSTOCELE) REPAIR;  Surgeon: Floyce Stakes. Pamala Hurry, MD;  Location: [facility];  Service: Gynecology;  Laterality: N/A;  . APPENDECTOMY    . BACK SURGERY     x 3 - 2 rods and 8 screws  . CARDIOVERSION N/A 08/24/2017   Procedure: CARDIOVERSION;  Surgeon: Jerline Pain, MD;  [facility] ENDOSCOPY;  Service: Cardiovascular;  Laterality: N/A;  . COLONOSCOPY    . CYSTOCELE REPAIR    . DENTAL SURGERY     infected tooth - general  . DILATION AND CURETTAGE OF UTERUS     x several  . ELECTROPHYSIOLOGIC STUDY N/A 11/28/2015   Procedure: Atrial Fibrillation Ablation;  Surgeon: [name], MD;  Location: [place] [facility];  Service: Cardiovascular;  Laterality: N/A;  . EP IMPLANTABLE DEVICE N/A 06/08/2016   Procedure: Loop Recorder Insertion;  Surgeon: [name], MD;  Location: [place] [facility];  Service: Cardiovascular;  Laterality: N/A;  . EXAMINATION UNDER ANESTHESIA N/A 01/15/2014   Procedure: EXAM UNDER ANESTHESIA with evacuation of hematoma and over sewing of vaginal mucosa.;  Surgeon: Floyce Stakes. Pamala Hurry, MD;  Location: [facility];  Service: Gynecology;  Laterality: N/A;  . HAND SURGERY     left  . hemorrhoid injection  04/2011  . JOINT REPLACEMENT     left thumb replacement  . TONSILLECTOMY      Current Outpatient Medications  Medication Sig Dispense Refill  . acetaminophen (TYLENOL) 500 MG tablet Take 500-1,000 mg by mouth every 6 (six) hours as  needed for moderate pain or headache.    . Biotin 5 MG CAPS Take 5 mg by mouth daily.     Marland Kitchen diltiazem (CARDIZEM CD) 120 MG 24 hr capsule Take 1 capsule (120 mg total) by mouth at bedtime. (Patient taking differently: Take 120 mg by mouth every evening. ) 30 capsule 3  . diltiazem (CARDIZEM CD) 180 MG 24 hr capsule Take 1 capsule (180 mg total) by mouth daily.    . hydrochlorothiazide (MICROZIDE) 12.5 MG capsule Take 12.5 mg by mouth daily.    Marland Kitchen levothyroxine (SYNTHROID, LEVOTHROID) 75 MCG tablet Take 75 mcg by mouth at bedtime.     . Probiotic Product (PROBIOTIC PO) Take 1 tablet by mouth daily.     . psyllium  (METAMUCIL) 58.6 % powder Take 1 packet by mouth daily.    . ramipril (ALTACE) 5 MG capsule Take 5 mg by mouth at bedtime.     . vitamin B-12 (CYANOCOBALAMIN) 1000 MCG tablet Take 1,000 mcg by mouth daily.    Marland Kitchen warfarin (COUMADIN) 1 MG tablet Take 2 mg by mouth at bedtime.     . Calcium Carbonate-Vitamin D (CALCIUM-VITAMIN D) 500-200 MG-UNIT per tablet Take 1 tablet by mouth daily.     Marland Kitchen diltiazem (CARDIZEM) 30 MG tablet Take 30 mg by mouth every 4 (four) hours as needed (for fast heart rate > 100 as long as BP  > 100).      No current facility-administered medications for this encounter.     Allergies  Allergen Reactions  . Pneumovax 23 [Pneumococcal Vac Polyvalent] Other (See Comments)    Arm swelling & fever  . Codeine Nausea And Vomiting  . Cephalexin Anxiety  . Sudafed [Pseudoephedrine Hcl] Anxiety  . Sulfa Antibiotics Rash    Social History   Socioeconomic History  . Marital status: Married    Spouse name: Not on file  . Number of children: Not on file  . Years of education: Not on file  . Highest education level: Not on file  Social Needs  . Financial resource strain: Not on file  . Food insecurity - worry: Not on file  . Food insecurity - inability: Not on file  . Transportation needs - medical: Not on file  . Transportation needs - non-medical: Not on file  Occupational History  . Not on file  Tobacco Use  . Smoking status: Never Smoker  . Smokeless tobacco: Never Used  Substance and Sexual Activity  . Alcohol use: No  . Drug use: No  . Sexual activity: Not Currently    Birth control/protection: Post-menopausal  Other Topics Concern  . Not on file  Social History Narrative  . Not on file    Family History  Problem Relation Age of Onset  . Heart disease Mother   . Heart attack Mother   . Hypertension Father   . Diabetes Father   . Stroke Father   . Heart disease Sister   . Hypertension Sister   . Heart disease Brother   . Sudden death Maternal  Grandfather   . Heart attack Sister   . Heart failure Brother     ROS- All systems are reviewed and negative except as per the HPI above  Physical Exam: Vitals:   09/08/17 1503  BP: (!) 164/82  Pulse: 92  Weight: 180 lb (81.6 kg)  Height: 5\' 5"  (1.651 m)   Wt Readings from Last 3 Encounters:  09/08/17 180 lb (81.6 kg)  08/24/17 177 lb (80.3 kg)  08/11/17 177 lb (80.3 kg)    Labs: Lab Results  Component Value Date   NA 139 08/24/2017   K 3.4 (L) 08/24/2017   CL 107 08/24/2017   CO2 24 08/24/2017   GLUCOSE 105 (H) 08/24/2017   BUN 15 08/24/2017   CREATININE 0.96 08/24/2017   CALCIUM 9.5 08/24/2017   Lab Results  Component Value Date   INR 2.05 08/24/2017   No results found for: CHOL, HDL, LDLCALC, TRIG   GEN- The patient is well appearing, alert and oriented x 3 today.   Head- normocephalic, atraumatic Eyes-  Sclera clear, conjunctiva pink Ears- hearing intact Oropharynx- clear Neck- supple, no JVP Lymph- no cervical lymphadenopathy Lungs- Clear to ausculation bilaterally, normal work of breathing Heart- irregular rate and rhythm, no murmurs, rubs or gallops, PMI not laterally displaced GI- soft, NT, ND, + BS Extremities- no clubbing, cyanosis, or edema MS- no significant deformity or atrophy Skin- no rash or lesion Psych- euthymic mood, full affect Neuro- strength and sensation are intact  EKG-afib at 92 bpm, qrs int 80 ms, qtc 457 ms Epic records reviewed    Assessment and Plan: 1. Afib S/p ablation 11/28/2015 Return of afib present since 11/19 Successful cardioversion 12/18  but with ERAF   Has used flecainide in the past without maintaining SR Discussed repeat ablation or tikosyn, she would like to avoid amiodarone She would like to discuss with Dr. Rayann Heman re another ablation vrs tikosyn Repeat bmet as Kt was slightly low before cardioversion Update echo If chooses  tikosyn will have to stop HCTZ  Crcl cal is 46.08, qualifying for 250 mcg  tikosyn bid Qtc varies 430 to 460 ms, but I believe acceptably for tikosyn She will discuss with the coumadin clinc at PCP office to help transition over to [place]. Vergie Zahm, [facility] [address] [place], [place] [address] [phone]

## 2017-09-09 ENCOUNTER — Telehealth: Payer: Self-pay | Admitting: Pharmacist

## 2017-09-09 NOTE — Telephone Encounter (Signed)
Medication list reviewed in anticipation of upcoming Tikosyn initiation. Patient is taking HCTZ which will need to be discontinued at least 2 days prior to Tikosyn initiation.  Patient is not on any other contraindicated or QTc prolongating medications.   Most recent Potassium was also low and may need supplementation prior to Tikosyn initiation.   Patient is anticoagulated on warfarin (not managed by our Coumadin clinic) and will need 4 therapeutic INRs.   Patient will need to be counseled to avoid use of Benadryl while on Tikosyn and in the 2-3 days prior to Tikosyn initiation.

## 2017-09-13 LAB — CUP PACEART REMOTE DEVICE CHECK
Date Time Interrogation Session: 20181226211051
Implantable Pulse Generator Implant Date: 20171002

## 2017-09-15 ENCOUNTER — Ambulatory Visit (HOSPITAL_COMMUNITY): Payer: Medicare Other | Admitting: Nurse Practitioner

## 2017-09-17 ENCOUNTER — Encounter: Payer: Self-pay | Admitting: Internal Medicine

## 2017-09-17 ENCOUNTER — Ambulatory Visit (INDEPENDENT_AMBULATORY_CARE_PROVIDER_SITE_OTHER): Payer: Medicare Other | Admitting: Internal Medicine

## 2017-09-17 ENCOUNTER — Other Ambulatory Visit: Payer: Self-pay

## 2017-09-17 VITALS — BP 142/78 | HR 90 | Ht 65.0 in | Wt 180.4 lb

## 2017-09-17 DIAGNOSIS — I481 Persistent atrial fibrillation: Secondary | ICD-10-CM | POA: Diagnosis not present

## 2017-09-17 DIAGNOSIS — I4819 Other persistent atrial fibrillation: Secondary | ICD-10-CM

## 2017-09-17 MED ORDER — APIXABAN 5 MG PO TABS: 5.0000 mg | ORAL_TABLET | Freq: Two times a day (BID) | ORAL | 0 refills | Status: DC

## 2017-09-17 NOTE — Progress Notes (Signed)
PCP: Lajean Manes, MD Primary Cardiologist: Dr Marlou Porch Primary EP: Dr Rayann Heman  Frances [name] is a 84 y.o. female who presents today for routine electrophysiology followup.  Since last being seen in our clinic, the patient reports doing reasonably well.  She has been having difficulty with afib since November.  She has palpitations, fatigue, and decreased exercise tolerance with her afib.  She is s/p cardioversion 08/24/17 but quickly returned to afib.  Today, she denies symptoms of chest pain, shortness of breath,  lower extremity edema, dizziness, presyncope, or syncope.  The patient is otherwise without complaint today.   Past Medical History:  Diagnosis Date  . Arthritis    hands, back  . Chronic kidney disease    stage 3 renal disease - no med  . Dysrhythmia    Hx - a-fib 05/2010 - tx with meds, no problem since 05/2010  . Hearing loss    bilateral hearing aids  . Hypertension    controlled with meds  . Hypothyroidism   . Internal hemorrhoid   . Paroxysmal atrial fibrillation (HCC)   . Peripheral vascular disease ([place])    right arm and right shoulder blood clots r/t a fall  . Rectal bleeding   . SVD (spontaneous vaginal delivery) 1957, 1959   x 2  . Thyroid disease    hypothyroidism   Past Surgical History:  Procedure Laterality Date  . ABDOMINAL HYSTERECTOMY     and right ovary  . ANTERIOR AND POSTERIOR REPAIR N/A 01/03/2014   Procedure: ANTERIOR (CYSTOCELE) REPAIR;  Surgeon: Floyce Stakes. Pamala Hurry, MD;  Location: [facility];  Service: Gynecology;  Laterality: N/A;  . APPENDECTOMY    . BACK SURGERY     x 3 - 2 rods and 8 screws  . CARDIOVERSION N/A 08/24/2017   Procedure: CARDIOVERSION;  Surgeon: Jerline Pain, MD;  Location: [facility] ENDOSCOPY;  Service: Cardiovascular;  Laterality: N/A;  . COLONOSCOPY    . CYSTOCELE REPAIR    . DENTAL SURGERY     infected tooth - general  . DILATION AND CURETTAGE OF UTERUS     x several  . ELECTROPHYSIOLOGIC STUDY N/A 11/28/2015   Procedure: Atrial Fibrillation Ablation;  Surgeon: [name], MD;  Location: [place] [facility];  Service: Cardiovascular;  Laterality: N/A;  . EP IMPLANTABLE DEVICE N/A 06/08/2016   Procedure: Loop Recorder Insertion;  Surgeon: [name], MD;  Location: [place] [facility];  Service: Cardiovascular;  Laterality: N/A;  . EXAMINATION UNDER ANESTHESIA N/A 01/15/2014   Procedure: EXAM UNDER ANESTHESIA with evacuation of hematoma and over sewing of vaginal mucosa.;  Surgeon: Floyce Stakes. Pamala Hurry, MD;  Location: [facility] [facility];  Service: Gynecology;  Laterality: N/A;  . HAND SURGERY     left  . hemorrhoid injection  04/2011  . JOINT REPLACEMENT     left thumb replacement  . TONSILLECTOMY      ROS- all systems are reviewed and negatives except as per HPI above  Current Outpatient Medications  Medication Sig Dispense Refill  . acetaminophen (TYLENOL) 500 MG tablet Take 500-1,000 mg by mouth every 6 (six) hours as needed for moderate pain or headache.    . Biotin 5 MG CAPS Take 5 mg by mouth daily.     . Calcium Carbonate-Vitamin D (CALCIUM-VITAMIN D) 500-200 MG-UNIT per tablet Take 1 tablet by mouth daily.     Marland Kitchen diltiazem (CARDIZEM CD) 120 MG 24 hr capsule Take 1 capsule (120 mg total) by mouth at bedtime. (Patient taking differently: Take  120 mg by mouth every evening. ) 30 capsule 3  . diltiazem (CARDIZEM CD) 180 MG 24 hr capsule Take 1 capsule (180 mg total) by mouth daily.    Marland Kitchen diltiazem (CARDIZEM) 30 MG tablet Take 30 mg by mouth every 4 (four) hours as needed (for fast heart rate > 100 as long as BP  > 100).     . hydrochlorothiazide (MICROZIDE) 12.5 MG capsule Take 12.5 mg by mouth daily.    Marland Kitchen levothyroxine (SYNTHROID, LEVOTHROID) 75 MCG tablet Take 75 mcg by mouth at bedtime.     . Probiotic Product (PROBIOTIC PO) Take 1 tablet by mouth daily.     . psyllium (METAMUCIL) 58.6 % powder Take 1 packet by mouth daily.    . ramipril (ALTACE) 5 MG capsule Take 5 mg by mouth at bedtime.     . vitamin  B-12 (CYANOCOBALAMIN) 1000 MCG tablet Take 1,000 mcg by mouth daily.    Marland Kitchen apixaban (ELIQUIS) 5 MG TABS tablet Take 1 tablet (5 mg total) by mouth 2 (two) times daily. 60 tablet 0   No current facility-administered medications for this visit.     Physical Exam: Vitals:   09/17/17 0853  BP: (!) 142/78  Pulse: 90  SpO2: 98%  Weight: 180 lb 6.4 oz (81.8 kg)  Height: 5\' 5"  (1.651 m)    GEN- The patient is well appearing, alert and oriented x 3 today.   Head- normocephalic, atraumatic Eyes-  Sclera clear, conjunctiva pink Ears- hearing intact Oropharynx- clear Lungs- Clear to ausculation bilaterally, normal work of breathing Heart- irregular rate and rhythm, no murmurs, rubs or gallops, PMI not laterally displaced GI- soft, NT, ND, + BS Extremities- no clubbing, cyanosis, or edema  EKG tracing 09/08/17 is personally reviewed and reveals afib with V rates 92 bpm  ILR interrogation today is personally reviewed  Assessment and Plan:  1. Persistent atrial fibrillation S/p recent cardioversion.  ILR today reveals afib burden of 18% (0.5% when I saw her last).  Her ILR at times reveals a regular tachycardia.  Though this could be rapid afib, I also think it could be atrial flutter or another SVT. Therapeutic strategies for afib including medicine (sotalol, tikosyn, amiodarone) and ablation were discussed in detail with the patient today. Risk, benefits, and alternatives to EP study and radiofrequency ablation for afib were also discussed in detail today. These risks include but are not limited to stroke, bleeding, vascular damage, tamponade, perforation, damage to the esophagus, lungs, and other structures, pulmonary vein stenosis, worsening renal function, and death. The patient understands these risk and wishes to proceed.  We will therefore proceed with catheter ablation at the next available time.  Will plan TEE prior to ablation.  Will convert coumadin to eliquis today per patients prior  discussions with Af clinic.  INR today was 1.9.  I have started eliquis 5mg  BID today.    [name] MD, [facility] 09/17/2017 4:28 PM

## 2017-09-17 NOTE — H&P (View-Only) (Signed)
PCP: Lajean Manes, MD Primary Cardiologist: Dr Marlou Porch Primary EP: Dr Rayann Heman  Frances [name] is a 84 y.o. female who presents today for routine electrophysiology followup.  Since last being seen in our clinic, the patient reports doing reasonably well.  She has been having difficulty with afib since November.  She has palpitations, fatigue, and decreased exercise tolerance with her afib.  She is s/p cardioversion 08/24/17 but quickly returned to afib.  Today, she denies symptoms of chest pain, shortness of breath,  lower extremity edema, dizziness, presyncope, or syncope.  The patient is otherwise without complaint today.   Past Medical History:  Diagnosis Date  . Arthritis    hands, back  . Chronic kidney disease    stage 3 renal disease - no med  . Dysrhythmia    Hx - a-fib 05/2010 - tx with meds, no problem since 05/2010  . Hearing loss    bilateral hearing aids  . Hypertension    controlled with meds  . Hypothyroidism   . Internal hemorrhoid   . Paroxysmal atrial fibrillation (HCC)   . Peripheral vascular disease ([place])    right arm and right shoulder blood clots r/t a fall  . Rectal bleeding   . SVD (spontaneous vaginal delivery) 1957, 1959   x 2  . Thyroid disease    hypothyroidism   Past Surgical History:  Procedure Laterality Date  . ABDOMINAL HYSTERECTOMY     and right ovary  . ANTERIOR AND POSTERIOR REPAIR N/A 01/03/2014   Procedure: ANTERIOR (CYSTOCELE) REPAIR;  Surgeon: Floyce Stakes. Pamala Hurry, MD;  Location: [facility];  Service: Gynecology;  Laterality: N/A;  . APPENDECTOMY    . BACK SURGERY     x 3 - 2 rods and 8 screws  . CARDIOVERSION N/A 08/24/2017   Procedure: CARDIOVERSION;  Surgeon: Jerline Pain, MD;  Location: [facility];  Service: Cardiovascular;  Laterality: N/A;  . COLONOSCOPY    . CYSTOCELE REPAIR    . DENTAL SURGERY     infected tooth - general  . DILATION AND CURETTAGE OF UTERUS     x several  . ELECTROPHYSIOLOGIC STUDY N/A 11/28/2015   Procedure: Atrial Fibrillation Ablation;  Surgeon: [name], MD;  Location: [place] [facility];  Service: Cardiovascular;  Laterality: N/A;  . EP IMPLANTABLE DEVICE N/A 06/08/2016   Procedure: Loop Recorder Insertion;  Surgeon: [name], MD;  Location: [place] [facility];  Service: Cardiovascular;  Laterality: N/A;  . EXAMINATION UNDER ANESTHESIA N/A 01/15/2014   Procedure: EXAM UNDER ANESTHESIA with evacuation of hematoma and over sewing of vaginal mucosa.;  Surgeon: Floyce Stakes. Pamala Hurry, MD;  Location: [facility];  Service: Gynecology;  Laterality: N/A;  . HAND SURGERY     left  . hemorrhoid injection  04/2011  . JOINT REPLACEMENT     left thumb replacement  . TONSILLECTOMY      ROS- all systems are reviewed and negatives except as per HPI above  Current Outpatient Medications  Medication Sig Dispense Refill  . acetaminophen (TYLENOL) 500 MG tablet Take 500-1,000 mg by mouth every 6 (six) hours as needed for moderate pain or headache.    . Biotin 5 MG CAPS Take 5 mg by mouth daily.     . Calcium Carbonate-Vitamin D (CALCIUM-VITAMIN D) 500-200 MG-UNIT per tablet Take 1 tablet by mouth daily.     Marland Kitchen diltiazem (CARDIZEM CD) 120 MG 24 hr capsule Take 1 capsule (120 mg total) by mouth at bedtime. (Patient taking differently: Take  120 mg by mouth every evening. ) 30 capsule 3  . diltiazem (CARDIZEM CD) 180 MG 24 hr capsule Take 1 capsule (180 mg total) by mouth daily.    Marland Kitchen diltiazem (CARDIZEM) 30 MG tablet Take 30 mg by mouth every 4 (four) hours as needed (for fast heart rate > 100 as long as BP  > 100).     . hydrochlorothiazide (MICROZIDE) 12.5 MG capsule Take 12.5 mg by mouth daily.    Marland Kitchen levothyroxine (SYNTHROID, LEVOTHROID) 75 MCG tablet Take 75 mcg by mouth at bedtime.     . Probiotic Product (PROBIOTIC PO) Take 1 tablet by mouth daily.     . psyllium (METAMUCIL) 58.6 % powder Take 1 packet by mouth daily.    . ramipril (ALTACE) 5 MG capsule Take 5 mg by mouth at bedtime.     . vitamin  B-12 (CYANOCOBALAMIN) 1000 MCG tablet Take 1,000 mcg by mouth daily.    Marland Kitchen apixaban (ELIQUIS) 5 MG TABS tablet Take 1 tablet (5 mg total) by mouth 2 (two) times daily. 60 tablet 0   No current facility-administered medications for this visit.     Physical Exam: Vitals:   09/17/17 0853  BP: (!) 142/78  Pulse: 90  SpO2: 98%  Weight: 180 lb 6.4 oz (81.8 kg)  Height: 5\' 5"  (1.651 m)    GEN- The patient is well appearing, alert and oriented x 3 today.   Head- normocephalic, atraumatic Eyes-  Sclera clear, conjunctiva pink Ears- hearing intact Oropharynx- clear Lungs- Clear to ausculation bilaterally, normal work of breathing Heart- irregular rate and rhythm, no murmurs, rubs or gallops, PMI not laterally displaced GI- soft, NT, ND, + BS Extremities- no clubbing, cyanosis, or edema  EKG tracing 09/08/17 is personally reviewed and reveals afib with V rates 92 bpm  ILR interrogation today is personally reviewed  Assessment and Plan:  1. Persistent atrial fibrillation S/p recent cardioversion.  ILR today reveals afib burden of 18% (0.5% when I saw her last).  Her ILR at times reveals a regular tachycardia.  Though this could be rapid afib, I also think it could be atrial flutter or another SVT. Therapeutic strategies for afib including medicine (sotalol, tikosyn, amiodarone) and ablation were discussed in detail with the patient today. Risk, benefits, and alternatives to EP study and radiofrequency ablation for afib were also discussed in detail today. These risks include but are not limited to stroke, bleeding, vascular damage, tamponade, perforation, damage to the esophagus, lungs, and other structures, pulmonary vein stenosis, worsening renal function, and death. The patient understands these risk and wishes to proceed.  We will therefore proceed with catheter ablation at the next available time.  Will plan TEE prior to ablation.  Will convert coumadin to eliquis today per patients prior  discussions with Af clinic.  INR today was 1.9.  I have started eliquis 5mg  BID today.    [name] MD, [facility] 09/17/2017 4:28 PM

## 2017-09-17 NOTE — Patient Instructions (Addendum)
Medication Instructions:  Your physician has recommended you make the following change in your medication:  1) Start Eliquis 5 mg twice daily 2) Stop Warfarin    Labwork: Your physician recommends that you return for lab work today: BMP/CBC   Testing/Procedures: Your physician has requested that you have a TEE. During a TEE, sound waves are used to create images of your heart. It provides your doctor with information about the size and shape of your heart and how well your heart's chambers and valves are working. In this test, a transducer is attached to the end of a flexible tube that's guided down your throat and into your esophagus (the tube leading from you mouth to your stomach) to get a more detailed image of your heart. You are not awake for the procedure. Please see the instruction sheet given to you today. For further information please visit [email or web address]  Please arrive at [facility] at 9:30am Do not eat or drink after midnight the night prior to the procedure Do not take any medications the morning of the test Will need someone to drive you home at discharge      Your physician has recommended that you have an ablation. Catheter ablation is a medical procedure used to treat some cardiac arrhythmias (irregular heartbeats). During catheter ablation, a long, thin, flexible tube is put into a blood vessel in your groin (upper thigh), or neck. This tube is called an ablation catheter. It is then guided to your heart through the blood vessel. Radio frequency waves destroy small areas of heart tissue where abnormal heartbeats may cause an arrhythmia to start. Please see the instruction sheet given to you today.--09/28/17  Please arrive at [facility] at 5:30am Do not eat or drink after midnight the night prior to the procedure Do not take any medications the morning of the test DO NOT  MISS any doses of Eliquis Plan for one night stay Will need someone to drive you home at discharge      Follow-Up: Your physician recommends that you schedule a follow-up appointment in: 4 weeks from 09/28/17 with Roderic Palau, NP in [place] clinic and 3 months with Dr. Rayann Heman

## 2017-09-17 NOTE — H&P (View-Only) (Signed)
PCP: Lajean Manes, MD Primary Cardiologist: Dr Marlou Porch Primary EP: Dr Rayann Heman  Frances [name] is a 84 y.o. female who presents today for routine electrophysiology followup.  Since last being seen in our clinic, the patient reports doing reasonably well.  She has been having difficulty with afib since November.  She has palpitations, fatigue, and decreased exercise tolerance with her afib.  She is s/p cardioversion 08/24/17 but quickly returned to afib.  Today, she denies symptoms of chest pain, shortness of breath,  lower extremity edema, dizziness, presyncope, or syncope.  The patient is otherwise without complaint today.   Past Medical History:  Diagnosis Date  . Arthritis    hands, back  . Chronic kidney disease    stage 3 renal disease - no med  . Dysrhythmia    Hx - a-fib 05/2010 - tx with meds, no problem since 05/2010  . Hearing loss    bilateral hearing aids  . Hypertension    controlled with meds  . Hypothyroidism   . Internal hemorrhoid   . Paroxysmal atrial fibrillation (HCC)   . Peripheral vascular disease ([place])    right arm and right shoulder blood clots r/t a fall  . Rectal bleeding   . SVD (spontaneous vaginal delivery) 1957, 1959   x 2  . Thyroid disease    hypothyroidism   Past Surgical History:  Procedure Laterality Date  . ABDOMINAL HYSTERECTOMY     and right ovary  . ANTERIOR AND POSTERIOR REPAIR N/A 01/03/2014   Procedure: ANTERIOR (CYSTOCELE) REPAIR;  Surgeon: Floyce Stakes. Pamala Hurry, MD;  Location: [facility];  Service: Gynecology;  Laterality: N/A;  . APPENDECTOMY    . BACK SURGERY     x 3 - 2 rods and 8 screws  . CARDIOVERSION N/A 08/24/2017   Procedure: CARDIOVERSION;  Surgeon: Jerline Pain, MD;  Location: [facility];  Service: Cardiovascular;  Laterality: N/A;  . COLONOSCOPY    . CYSTOCELE REPAIR    . DENTAL SURGERY     infected tooth - general  . DILATION AND CURETTAGE OF UTERUS     x several  . ELECTROPHYSIOLOGIC STUDY N/A 11/28/2015   Procedure: Atrial Fibrillation Ablation;  Surgeon: [name], MD;  Location: [facility];  Service: Cardiovascular;  Laterality: N/A;  . EP IMPLANTABLE DEVICE N/A 06/08/2016   Procedure: Loop Recorder Insertion;  Surgeon: [name], MD;  Location: [facility];  Service: Cardiovascular;  Laterality: N/A;  . EXAMINATION UNDER ANESTHESIA N/A 01/15/2014   Procedure: EXAM UNDER ANESTHESIA with evacuation of hematoma and over sewing of vaginal mucosa.;  Surgeon: Floyce Stakes. Pamala Hurry, MD;  Location: [facility];  Service: Gynecology;  Laterality: N/A;  . HAND SURGERY     left  . hemorrhoid injection  04/2011  . JOINT REPLACEMENT     left thumb replacement  . TONSILLECTOMY      ROS- all systems are reviewed and negatives except as per HPI above  Current Outpatient Medications  Medication Sig Dispense Refill  . acetaminophen (TYLENOL) 500 MG tablet Take 500-1,000 mg by mouth every 6 (six) hours as needed for moderate pain or headache.    . Biotin 5 MG CAPS Take 5 mg by mouth daily.     . Calcium Carbonate-Vitamin D (CALCIUM-VITAMIN D) 500-200 MG-UNIT per tablet Take 1 tablet by mouth daily.     Marland Kitchen diltiazem (CARDIZEM CD) 120 MG 24 hr capsule Take 1 capsule (120 mg total) by mouth at bedtime. (Patient taking differently: Take  120 mg by mouth every evening. ) 30 capsule 3  . diltiazem (CARDIZEM CD) 180 MG 24 hr capsule Take 1 capsule (180 mg total) by mouth daily.    Marland Kitchen diltiazem (CARDIZEM) 30 MG tablet Take 30 mg by mouth every 4 (four) hours as needed (for fast heart rate > 100 as long as BP  > 100).     . hydrochlorothiazide (MICROZIDE) 12.5 MG capsule Take 12.5 mg by mouth daily.    Marland Kitchen levothyroxine (SYNTHROID, LEVOTHROID) 75 MCG tablet Take 75 mcg by mouth at bedtime.     . Probiotic Product (PROBIOTIC PO) Take 1 tablet by mouth daily.     . psyllium (METAMUCIL) 58.6 % powder Take 1 packet by mouth daily.    . ramipril (ALTACE) 5 MG capsule Take 5 mg by mouth at bedtime.     . vitamin  B-12 (CYANOCOBALAMIN) 1000 MCG tablet Take 1,000 mcg by mouth daily.    Marland Kitchen apixaban (ELIQUIS) 5 MG TABS tablet Take 1 tablet (5 mg total) by mouth 2 (two) times daily. 60 tablet 0   No current facility-administered medications for this visit.     Physical Exam: Vitals:   09/17/17 0853  BP: (!) 142/78  Pulse: 90  SpO2: 98%  Weight: 180 lb 6.4 oz (81.8 kg)  Height: 5\' 5"  (1.651 m)    GEN- The patient is well appearing, alert and oriented x 3 today.   Head- normocephalic, atraumatic Eyes-  Sclera clear, conjunctiva pink Ears- hearing intact Oropharynx- clear Lungs- Clear to ausculation bilaterally, normal work of breathing Heart- irregular rate and rhythm, no murmurs, rubs or gallops, PMI not laterally displaced GI- soft, NT, ND, + BS Extremities- no clubbing, cyanosis, or edema  EKG tracing 09/08/17 is personally reviewed and reveals afib with V rates 92 bpm  ILR interrogation today is personally reviewed  Assessment and Plan:  1. Persistent atrial fibrillation S/p recent cardioversion.  ILR today reveals afib burden of 18% (0.5% when I saw her last).  Her ILR at times reveals a regular tachycardia.  Though this could be rapid afib, I also think it could be atrial flutter or another SVT. Therapeutic strategies for afib including medicine (sotalol, tikosyn, amiodarone) and ablation were discussed in detail with the patient today. Risk, benefits, and alternatives to EP study and radiofrequency ablation for afib were also discussed in detail today. These risks include but are not limited to stroke, bleeding, vascular damage, tamponade, perforation, damage to the esophagus, lungs, and other structures, pulmonary vein stenosis, worsening renal function, and death. The patient understands these risk and wishes to proceed.  We will therefore proceed with catheter ablation at the next available time.  Will plan TEE prior to ablation.  Will convert coumadin to eliquis today per patients prior  discussions with Af clinic.  INR today was 1.9.  I have started eliquis 5mg  BID today.    [name] MD, [facility] 09/17/2017 4:28 PM

## 2017-09-18 LAB — BASIC METABOLIC PANEL
BUN/Creatinine Ratio: 20 (ref 12–28)
BUN: 17 mg/dL (ref 8–27)
CALCIUM: 9.7 mg/dL (ref 8.7–10.3)
CHLORIDE: 103 mmol/L (ref 96–106)
CO2: 25 mmol/L (ref 20–29)
Creatinine, Ser: 0.86 mg/dL (ref 0.57–1.00)
GFR, EST AFRICAN AMERICAN: 73 mL/min/{1.73_m2} (ref >59)
GFR, EST NON AFRICAN AMERICAN: 64 mL/min/{1.73_m2} (ref >59)
Glucose: 94 mg/dL (ref 65–99)
Potassium: 4.1 mmol/L (ref 3.5–5.2)
Sodium: 143 mmol/L (ref 134–144)

## 2017-09-18 LAB — CBC WITH DIFFERENTIAL/PLATELET
BASOS ABS: 0.1 10*3/uL (ref 0.0–0.2)
BASOS: 1 %
EOS (ABSOLUTE): 0.3 10*3/uL (ref 0.0–0.4)
EOS: 4 %
HEMOGLOBIN: 14.1 g/dL (ref 11.1–15.9)
Hematocrit: 42.6 % (ref 34.0–46.6)
IMMATURE GRANS (ABS): 0 10*3/uL (ref 0.0–0.1)
Immature Granulocytes: 0 %
LYMPHS: 34 %
Lymphocytes Absolute: 2.2 10*3/uL (ref 0.7–3.1)
MCH: 27.8 pg (ref 26.6–33.0)
MCHC: 33.1 g/dL (ref 31.5–35.7)
MCV: 84 fL (ref 79–97)
MONOCYTES: 9 %
Monocytes Absolute: 0.6 10*3/uL (ref 0.1–0.9)
NEUTROS ABS: 3.2 10*3/uL (ref 1.4–7.0)
Neutrophils: 52 %
Platelets: 251 10*3/uL (ref 150–379)
RBC: 5.07 x10E6/uL (ref 3.77–5.28)
RDW: 15.4 % (ref 12.3–15.4)
WBC: 6.3 10*3/uL (ref 3.4–10.8)

## 2017-09-20 ENCOUNTER — Other Ambulatory Visit (HOSPITAL_COMMUNITY): Payer: Medicare Other

## 2017-09-20 ENCOUNTER — Ambulatory Visit (HOSPITAL_COMMUNITY)
Admission: RE | Admit: 2017-09-20 | Discharge: 2017-09-20 | Disposition: A | Discharge status: Home / Self Care | Payer: Medicare Other | Source: Ambulatory Visit | Attending: Nurse Practitioner | Admitting: Nurse Practitioner

## 2017-09-20 DIAGNOSIS — I481 Persistent atrial fibrillation: Secondary | ICD-10-CM | POA: Diagnosis not present

## 2017-09-20 DIAGNOSIS — I340 Nonrheumatic mitral (valve) insufficiency: Secondary | ICD-10-CM | POA: Insufficient documentation

## 2017-09-20 DIAGNOSIS — I119 Hypertensive heart disease without heart failure: Secondary | ICD-10-CM | POA: Insufficient documentation

## 2017-09-20 DIAGNOSIS — I4819 Other persistent atrial fibrillation: Secondary | ICD-10-CM

## 2017-09-21 ENCOUNTER — Encounter (HOSPITAL_COMMUNITY): Payer: Self-pay | Admitting: *Deleted

## 2017-09-27 ENCOUNTER — Ambulatory Visit (HOSPITAL_COMMUNITY)
Admission: RE | Admit: 2017-09-27 | Discharge: 2017-09-27 | Disposition: A | Discharge status: Home / Self Care | Payer: Medicare Other | Source: Ambulatory Visit | Attending: Cardiology | Admitting: Cardiology

## 2017-09-27 ENCOUNTER — Other Ambulatory Visit: Payer: Self-pay

## 2017-09-27 ENCOUNTER — Encounter (HOSPITAL_COMMUNITY): Payer: Self-pay | Admitting: Certified Registered Nurse Anesthetist

## 2017-09-27 ENCOUNTER — Ambulatory Visit (HOSPITAL_BASED_OUTPATIENT_CLINIC_OR_DEPARTMENT_OTHER)
Admission: RE | Admit: 2017-09-27 | Discharge: 2017-09-27 | Disposition: A | Discharge status: Home / Self Care | Payer: Medicare Other | Source: Ambulatory Visit | Attending: Nurse Practitioner | Admitting: Nurse Practitioner

## 2017-09-27 ENCOUNTER — Ambulatory Visit (HOSPITAL_COMMUNITY): Payer: Medicare Other | Admitting: Certified Registered Nurse Anesthetist

## 2017-09-27 ENCOUNTER — Encounter (HOSPITAL_COMMUNITY)
Admission: RE | Disposition: A | Discharge status: Home / Self Care | Payer: Self-pay | Source: Ambulatory Visit | Attending: Cardiology

## 2017-09-27 DIAGNOSIS — E039 Hypothyroidism, unspecified: Secondary | ICD-10-CM | POA: Diagnosis not present

## 2017-09-27 DIAGNOSIS — Z79890 Hormone replacement therapy (postmenopausal): Secondary | ICD-10-CM | POA: Insufficient documentation

## 2017-09-27 DIAGNOSIS — I340 Nonrheumatic mitral (valve) insufficiency: Secondary | ICD-10-CM

## 2017-09-27 DIAGNOSIS — Z79899 Other long term (current) drug therapy: Secondary | ICD-10-CM | POA: Diagnosis not present

## 2017-09-27 DIAGNOSIS — I440 Atrioventricular block, first degree: Secondary | ICD-10-CM | POA: Diagnosis not present

## 2017-09-27 DIAGNOSIS — I4891 Unspecified atrial fibrillation: Secondary | ICD-10-CM | POA: Diagnosis not present

## 2017-09-27 DIAGNOSIS — I739 Peripheral vascular disease, unspecified: Secondary | ICD-10-CM | POA: Insufficient documentation

## 2017-09-27 DIAGNOSIS — N183 Chronic kidney disease, stage 3 (moderate): Secondary | ICD-10-CM | POA: Diagnosis not present

## 2017-09-27 DIAGNOSIS — Z7901 Long term (current) use of anticoagulants: Secondary | ICD-10-CM | POA: Insufficient documentation

## 2017-09-27 DIAGNOSIS — I481 Persistent atrial fibrillation: Secondary | ICD-10-CM | POA: Diagnosis not present

## 2017-09-27 DIAGNOSIS — I480 Paroxysmal atrial fibrillation: Secondary | ICD-10-CM | POA: Diagnosis present

## 2017-09-27 DIAGNOSIS — I129 Hypertensive chronic kidney disease with stage 1 through stage 4 chronic kidney disease, or unspecified chronic kidney disease: Secondary | ICD-10-CM | POA: Diagnosis not present

## 2017-09-27 DIAGNOSIS — I10 Essential (primary) hypertension: Secondary | ICD-10-CM | POA: Diagnosis not present

## 2017-09-27 DIAGNOSIS — I081 Rheumatic disorders of both mitral and tricuspid valves: Secondary | ICD-10-CM | POA: Diagnosis not present

## 2017-09-27 HISTORY — PX: TEE WITHOUT CARDIOVERSION: SHX5443

## 2017-09-27 SURGERY — ECHOCARDIOGRAM, TRANSESOPHAGEAL
Anesthesia: Monitor Anesthesia Care

## 2017-09-27 MED ORDER — LIDOCAINE 2% (20 MG/ML) 5 ML SYRINGE
INTRAMUSCULAR | Status: DC | PRN
  Administered 2017-09-27: 60 mg via INTRAVENOUS

## 2017-09-27 MED ORDER — PROPOFOL 10 MG/ML IV BOLUS
INTRAVENOUS | Status: DC | PRN
  Administered 2017-09-27 (×2): 20 mg via INTRAVENOUS

## 2017-09-27 MED ORDER — PROPOFOL 500 MG/50ML IV EMUL
INTRAVENOUS | Status: DC | PRN
  Administered 2017-09-27: 50 ug/kg/min via INTRAVENOUS

## 2017-09-27 MED ORDER — SODIUM CHLORIDE 0.9 % IV SOLN: INTRAVENOUS | Status: DC

## 2017-09-27 MED ORDER — LACTATED RINGERS IV SOLN
INTRAVENOUS | Status: DC | PRN
  Administered 2017-09-27: 10:00:00 via INTRAVENOUS

## 2017-09-27 NOTE — Anesthesia Preprocedure Evaluation (Addendum)
Anesthesia Evaluation  Patient identified by MRN, date of birth, ID band Patient awake    Reviewed: Allergy & Precautions, NPO status , Patient's Chart, lab work & pertinent test results  Airway Mallampati: II  TM Distance: >3 FB Neck ROM: Full    Dental no notable dental hx.    Pulmonary neg pulmonary ROS,    Pulmonary exam normal breath sounds clear to auscultation       Cardiovascular hypertension, negative cardio ROS Normal cardiovascular exam Rhythm:Regular Rate:Normal     Neuro/Psych negative neurological ROS  negative psych ROS   GI/Hepatic negative GI ROS, Neg liver ROS,   Endo/Other  negative endocrine ROS  Renal/GU negative Renal ROS  negative genitourinary   Musculoskeletal negative musculoskeletal ROS (+)   Abdominal   Peds  Hematology negative hematology ROS (+)   Anesthesia Other Findings   Reproductive/Obstetrics                            Anesthesia Physical Anesthesia Plan  ASA: III  Anesthesia Plan: MAC   Post-op Pain Management:    Induction: Intravenous  PONV Risk Score and Plan: 2 and Treatment may vary due to age or medical condition, Dexamethasone and Ondansetron  Airway Management Planned: Mask, Natural Airway and Nasal Cannula  Additional Equipment:   Intra-op Plan:   Post-operative Plan:   Informed Consent: I have reviewed the patients History and Physical, chart, labs and discussed the procedure including the risks, benefits and alternatives for the proposed anesthesia with the patient or authorized representative who has indicated his/her understanding and acceptance.   Dental advisory given  Plan Discussed with: CRNA and Anesthesiologist  Anesthesia Plan Comments:         Anesthesia Quick Evaluation

## 2017-09-27 NOTE — Anesthesia Procedure Notes (Signed)
Procedure Name: MAC Date/Time: 09/27/2017 10:11 AM Performed by: Candis Shine, CRNA Pre-anesthesia Checklist: Patient identified, Emergency Drugs available, Patient being monitored, Suction available and Timeout performed Patient Re-evaluated:Patient Re-evaluated prior to induction Oxygen Delivery Method: Nasal cannula Dental Injury: Teeth and Oropharynx as per pre-operative assessment

## 2017-09-27 NOTE — Interval H&P Note (Signed)
History and Physical Interval Note:  09/27/2017 9:58 AM  Frances [name]  has presented today for surgery, with the diagnosis of AFIB  The various methods of treatment have been discussed with the patient and family. After consideration of risks, benefits and other options for treatment, the patient has consented to  Procedure(s): TRANSESOPHAGEAL ECHOCARDIOGRAM (TEE) (N/A) as a surgical intervention .  The patient's history has been reviewed, patient examined, no change in status, stable for surgery.  I have reviewed the patient's chart and labs.  Questions were answered to the patient's satisfaction.     [name]

## 2017-09-27 NOTE — CV Procedure (Addendum)
    PROCEDURE NOTE:  Procedure:  Transesophageal echocardiogram Operator:  [name], MD Indications:  Atrial fibrillation Complications: None  During this procedure the patient is administered a total of Propofol 120 mg to achieve and maintain moderate conscious sedation.  The patient's heart rate, blood pressure, and oxygen saturation are monitored continuously during the procedure by anesthesai.   Results: Normal LV size and function Normal RV size and function Mildly dilated RA Moderately dilated LA.  There is significant spontaneous echo contrast in the LA and LA appendage with significant swirling of contrast in the mouth of the appendage c/w sluggish blood flow.  The LAA empyting velocity is significantly reduced.  No obvious LA or LAA thrombus. Normal TV with mild TR Normal PV Normal MV with mild to moderate MR with multiple jets Mildly thickened and calcified  trileaflet AV There appears to be a PFO with  shunt by colorflow dopper but no evidence of shunt by agitated saline contrast.  Mild atherosclerosis of the thoracic and ascending aorta.  The patient tolerated the procedure well and was transferred back to their room in stable condition.  Signed: [name], MD [facility]

## 2017-09-27 NOTE — Anesthesia Postprocedure Evaluation (Signed)
Anesthesia Post Note  Patient: Frances [name]  Procedure(s) Performed: TRANSESOPHAGEAL ECHOCARDIOGRAM (TEE) (N/A )     Patient location during evaluation: Endoscopy Anesthesia Type: MAC Level of consciousness: awake and alert Pain management: pain level controlled Vital Signs Assessment: post-procedure vital signs reviewed and stable Respiratory status: spontaneous breathing, nonlabored ventilation, respiratory function stable and patient connected to nasal cannula oxygen Cardiovascular status: stable and blood pressure returned to baseline Postop Assessment: no apparent nausea or vomiting Anesthetic complications: no    Last Vitals:  Vitals:   09/27/17 1050 09/27/17 1100  BP: (!) 148/95 (!) 159/90  Pulse: 95 86  Resp: 19 17  Temp:    SpO2: 95% 97%    Last Pain:  Vitals:   09/27/17 1041  TempSrc: Oral                 Barnet Glasgow

## 2017-09-27 NOTE — Interval H&P Note (Signed)
History and Physical Interval Note:  09/27/2017 9:33 AM  Frances [name]  has presented today for surgery, with the diagnosis of AFIB  The various methods of treatment have been discussed with the patient and family. After consideration of risks, benefits and other options for treatment, the patient has consented to  Procedure(s): TRANSESOPHAGEAL ECHOCARDIOGRAM (TEE) (N/A) as a surgical intervention .  The patient's history has been reviewed, patient examined, no change in status, stable for surgery.  I have reviewed the patient's chart and labs.  Questions were answered to the patient's satisfaction.     [name]

## 2017-09-27 NOTE — Transfer of Care (Signed)
Immediate Anesthesia Transfer of Care Note  Patient: [name]  Procedure(s) Performed: TRANSESOPHAGEAL ECHOCARDIOGRAM (TEE) (N/A )  Patient Location: Endoscopy Unit  Anesthesia Type:MAC  Level of Consciousness: awake, alert  and oriented  Airway & Oxygen Therapy: Patient Spontanous Breathing and Patient connected to nasal cannula oxygen  Post-op Assessment: Report given to RN and Post -op Vital signs reviewed and stable  Post vital signs: Reviewed and stable  Last Vitals:  Vitals:   09/27/17 0944 09/27/17 1041  BP: (!) 163/99 136/90  Pulse: (!) 106 (!) 104  Resp: 14 20  Temp: 36.8 C 36.8 C  SpO2: 95% 94%    Last Pain:  Vitals:   09/27/17 1041  TempSrc: Oral         Complications: No apparent anesthesia complications

## 2017-09-27 NOTE — Discharge Instructions (Signed)

## 2017-09-27 NOTE — Progress Notes (Signed)
  Echocardiogram Echocardiogram Transesophageal has been performed.  Marquies Wanat T Zahriyah Joo 09/27/2017, 11:18 AM

## 2017-09-28 ENCOUNTER — Ambulatory Visit (HOSPITAL_COMMUNITY)
Admission: RE | Admit: 2017-09-28 | Discharge: 2017-09-29 | Disposition: A | Discharge status: Home / Self Care | Payer: Medicare Other | Source: Ambulatory Visit | Attending: Internal Medicine | Admitting: Internal Medicine

## 2017-09-28 ENCOUNTER — Other Ambulatory Visit: Payer: Self-pay

## 2017-09-28 ENCOUNTER — Encounter (HOSPITAL_COMMUNITY)
Admission: RE | Disposition: A | Discharge status: Home / Self Care | Payer: Self-pay | Source: Ambulatory Visit | Attending: Internal Medicine

## 2017-09-28 ENCOUNTER — Ambulatory Visit (HOSPITAL_COMMUNITY): Payer: Medicare Other | Admitting: Certified Registered Nurse Anesthetist

## 2017-09-28 ENCOUNTER — Encounter (HOSPITAL_COMMUNITY): Payer: Self-pay | Admitting: Certified Registered Nurse Anesthetist

## 2017-09-28 DIAGNOSIS — Z79890 Hormone replacement therapy (postmenopausal): Secondary | ICD-10-CM | POA: Diagnosis not present

## 2017-09-28 DIAGNOSIS — Z7901 Long term (current) use of anticoagulants: Secondary | ICD-10-CM | POA: Diagnosis not present

## 2017-09-28 DIAGNOSIS — Z79899 Other long term (current) drug therapy: Secondary | ICD-10-CM | POA: Diagnosis not present

## 2017-09-28 DIAGNOSIS — N183 Chronic kidney disease, stage 3 (moderate): Secondary | ICD-10-CM | POA: Diagnosis not present

## 2017-09-28 DIAGNOSIS — I129 Hypertensive chronic kidney disease with stage 1 through stage 4 chronic kidney disease, or unspecified chronic kidney disease: Secondary | ICD-10-CM | POA: Diagnosis not present

## 2017-09-28 DIAGNOSIS — I4819 Other persistent atrial fibrillation: Secondary | ICD-10-CM

## 2017-09-28 DIAGNOSIS — I4891 Unspecified atrial fibrillation: Secondary | ICD-10-CM | POA: Diagnosis not present

## 2017-09-28 DIAGNOSIS — I481 Persistent atrial fibrillation: Secondary | ICD-10-CM | POA: Diagnosis not present

## 2017-09-28 DIAGNOSIS — I739 Peripheral vascular disease, unspecified: Secondary | ICD-10-CM | POA: Insufficient documentation

## 2017-09-28 DIAGNOSIS — Z887 Allergy status to serum and vaccine status: Secondary | ICD-10-CM | POA: Insufficient documentation

## 2017-09-28 DIAGNOSIS — E039 Hypothyroidism, unspecified: Secondary | ICD-10-CM | POA: Diagnosis not present

## 2017-09-28 DIAGNOSIS — M1990 Unspecified osteoarthritis, unspecified site: Secondary | ICD-10-CM | POA: Insufficient documentation

## 2017-09-28 DIAGNOSIS — I10 Essential (primary) hypertension: Secondary | ICD-10-CM | POA: Diagnosis not present

## 2017-09-28 DIAGNOSIS — Z885 Allergy status to narcotic agent status: Secondary | ICD-10-CM | POA: Insufficient documentation

## 2017-09-28 DIAGNOSIS — Z882 Allergy status to sulfonamides status: Secondary | ICD-10-CM | POA: Insufficient documentation

## 2017-09-28 HISTORY — DX: Other persistent atrial fibrillation: I48.19

## 2017-09-28 HISTORY — PX: ATRIAL FIBRILLATION ABLATION: EP1191

## 2017-09-28 LAB — POCT ACTIVATED CLOTTING TIME
ACTIVATED CLOTTING TIME: 307 s
ACTIVATED CLOTTING TIME: 323 s
ACTIVATED CLOTTING TIME: 186 s
Activated Clotting Time: 191 seconds

## 2017-09-28 SURGERY — ATRIAL FIBRILLATION ABLATION
Anesthesia: Monitor Anesthesia Care

## 2017-09-28 MED ORDER — SODIUM CHLORIDE 0.9 % IV SOLN
INTRAVENOUS | Status: DC
  Administered 2017-09-28: 06:00:00 via INTRAVENOUS

## 2017-09-28 MED ORDER — FENTANYL CITRATE (PF) 100 MCG/2ML IJ SOLN
INTRAMUSCULAR | Status: DC | PRN
  Administered 2017-09-28 (×2): 50 ug via INTRAVENOUS
  Administered 2017-09-28 (×4): 25 ug via INTRAVENOUS

## 2017-09-28 MED ORDER — IOPAMIDOL (ISOVUE-370) INJECTION 76%
INTRAVENOUS | Status: AC
  Filled 2017-09-28: qty 50

## 2017-09-28 MED ORDER — HYDROCODONE-ACETAMINOPHEN 5-325 MG PO TABS: 1.0000 | ORAL_TABLET | ORAL | Status: DC | PRN

## 2017-09-28 MED ORDER — PROPOFOL 10 MG/ML IV BOLUS
INTRAVENOUS | Status: DC | PRN
  Administered 2017-09-28: 20 mg via INTRAVENOUS

## 2017-09-28 MED ORDER — ONDANSETRON HCL 4 MG/2ML IJ SOLN: 4.0000 mg | Freq: Four times a day (QID) | INTRAMUSCULAR | Status: DC | PRN

## 2017-09-28 MED ORDER — HEPARIN (PORCINE) IN NACL 2-0.9 UNIT/ML-% IJ SOLN
INTRAMUSCULAR | Status: AC
  Filled 2017-09-28: qty 500

## 2017-09-28 MED ORDER — HYDROCHLOROTHIAZIDE 12.5 MG PO CAPS
12.5000 mg | ORAL_CAPSULE | Freq: Every day | ORAL | Status: DC
  Administered 2017-09-29: 12.5 mg via ORAL
  Filled 2017-09-28: qty 1

## 2017-09-28 MED ORDER — HEPARIN (PORCINE) IN NACL 2-0.9 UNIT/ML-% IJ SOLN
INTRAMUSCULAR | Status: AC | PRN
  Administered 2017-09-28: 500 mL

## 2017-09-28 MED ORDER — PROMETHAZINE HCL 25 MG/ML IJ SOLN: 6.2500 mg | INTRAMUSCULAR | Status: DC | PRN

## 2017-09-28 MED ORDER — ACETAMINOPHEN 325 MG PO TABS: 650.0000 mg | ORAL_TABLET | ORAL | Status: DC | PRN

## 2017-09-28 MED ORDER — HEPARIN SODIUM (PORCINE) 1000 UNIT/ML IJ SOLN
INTRAMUSCULAR | Status: DC | PRN
  Administered 2017-09-28 (×2): 2000 [IU] via INTRAVENOUS

## 2017-09-28 MED ORDER — PROTAMINE SULFATE 10 MG/ML IV SOLN
INTRAVENOUS | Status: DC | PRN
  Administered 2017-09-28: 30 mg via INTRAVENOUS

## 2017-09-28 MED ORDER — LEVOTHYROXINE SODIUM 75 MCG PO TABS
75.0000 ug | ORAL_TABLET | Freq: Every day | ORAL | Status: DC
  Administered 2017-09-28: 75 ug via ORAL
  Filled 2017-09-28: qty 1

## 2017-09-28 MED ORDER — BUPIVACAINE HCL (PF) 0.25 % IJ SOLN
INTRAMUSCULAR | Status: AC
  Filled 2017-09-28: qty 30

## 2017-09-28 MED ORDER — SODIUM CHLORIDE 0.9 % IV SOLN: 250.0000 mL | INTRAVENOUS | Status: DC | PRN

## 2017-09-28 MED ORDER — DILTIAZEM HCL ER COATED BEADS 120 MG PO CP24
120.0000 mg | ORAL_CAPSULE | Freq: Every day | ORAL | Status: DC
  Administered 2017-09-28: 120 mg via ORAL
  Filled 2017-09-28: qty 1

## 2017-09-28 MED ORDER — SODIUM CHLORIDE 0.9% FLUSH
3.0000 mL | Freq: Two times a day (BID) | INTRAVENOUS | Status: DC
  Administered 2017-09-28 (×2): 3 mL via INTRAVENOUS

## 2017-09-28 MED ORDER — HEPARIN SODIUM (PORCINE) 1000 UNIT/ML IJ SOLN
INTRAMUSCULAR | Status: AC
  Filled 2017-09-28: qty 1

## 2017-09-28 MED ORDER — APIXABAN 5 MG PO TABS
5.0000 mg | ORAL_TABLET | Freq: Two times a day (BID) | ORAL | Status: DC
  Administered 2017-09-28 – 2017-09-29 (×3): 5 mg via ORAL
  Filled 2017-09-28 (×3): qty 1

## 2017-09-28 MED ORDER — BUPIVACAINE HCL (PF) 0.25 % IJ SOLN
INTRAMUSCULAR | Status: DC | PRN
  Administered 2017-09-28: 30 mL

## 2017-09-28 MED ORDER — PROPOFOL 500 MG/50ML IV EMUL
INTRAVENOUS | Status: DC | PRN
  Administered 2017-09-28: 50 ug/kg/min via INTRAVENOUS

## 2017-09-28 MED ORDER — HEPARIN SODIUM (PORCINE) 1000 UNIT/ML IJ SOLN
INTRAMUSCULAR | Status: DC | PRN
  Administered 2017-09-28: 12000 [IU] via INTRAVENOUS
  Administered 2017-09-28: 1000 [IU] via INTRAVENOUS

## 2017-09-28 MED ORDER — MIDAZOLAM HCL 5 MG/5ML IJ SOLN
INTRAMUSCULAR | Status: DC | PRN
  Administered 2017-09-28 (×2): 1 mg via INTRAVENOUS

## 2017-09-28 MED ORDER — DILTIAZEM HCL ER COATED BEADS 180 MG PO CP24
180.0000 mg | ORAL_CAPSULE | Freq: Every day | ORAL | Status: DC
  Administered 2017-09-29: 180 mg via ORAL
  Filled 2017-09-28: qty 1

## 2017-09-28 MED ORDER — FENTANYL CITRATE (PF) 100 MCG/2ML IJ SOLN: 25.0000 ug | INTRAMUSCULAR | Status: DC | PRN

## 2017-09-28 MED ORDER — PHENYLEPHRINE HCL 10 MG/ML IJ SOLN
INTRAMUSCULAR | Status: DC | PRN
  Administered 2017-09-28 (×2): 80 ug via INTRAVENOUS

## 2017-09-28 MED ORDER — RAMIPRIL 5 MG PO CAPS
5.0000 mg | ORAL_CAPSULE | Freq: Every day | ORAL | Status: DC
  Administered 2017-09-28: 5 mg via ORAL
  Filled 2017-09-28: qty 1

## 2017-09-28 MED ORDER — ONDANSETRON HCL 4 MG/2ML IJ SOLN
INTRAMUSCULAR | Status: DC | PRN
  Administered 2017-09-28: 4 mg via INTRAVENOUS

## 2017-09-28 MED ORDER — SODIUM CHLORIDE 0.9% FLUSH: 3.0000 mL | INTRAVENOUS | Status: DC | PRN

## 2017-09-28 SURGICAL SUPPLY — 18 items
BAG SNAP BAND KOVER 36X36 (MISCELLANEOUS) ×2 IMPLANT
BLANKET WARM UNDERBOD FULL ACC (MISCELLANEOUS) ×2 IMPLANT
CATH MAPPNG PENTARAY F 2-6-2MM (CATHETERS) IMPLANT
CATH NAVISTAR SMARTTOUCH DF (ABLATOR) ×1 IMPLANT
CATH SOUNDSTAR ECO REPROCESSED (CATHETERS) ×1 IMPLANT
CATH WEBSTER BI DIR CS D-F CRV (CATHETERS) ×1 IMPLANT
NDL TRANSSPETAL BRK-XS 71CM (NEEDLE) IMPLANT
NEEDLE TRANSSPETAL BRK-XS 71CM (NEEDLE) ×2
PACK EP LATEX FREE (CUSTOM PROCEDURE TRAY) ×2
PACK EP LF (CUSTOM PROCEDURE TRAY) ×1 IMPLANT
PAD DEFIB LIFELINK (PAD) ×2 IMPLANT
PATCH CARTO3 (PAD) ×1 IMPLANT
PENTARAY F 2-6-2MM (CATHETERS) ×2
SHEATH AVANTI 11F 11CM (SHEATH) ×1 IMPLANT
SHEATH PINNACLE 7F 10CM (SHEATH) ×2 IMPLANT
SHEATH PINNACLE 9F 10CM (SHEATH) ×1 IMPLANT
SHEATH SWARTZ TS SL2 63CM 8.5F (SHEATH) ×1 IMPLANT
TUBING SMART ABLATE COOLFLOW (TUBING) ×1 IMPLANT

## 2017-09-28 NOTE — Plan of Care (Signed)
Pt ambulates independently with standby assistance with ease. No c/o dyspnea, pain, or shortness of breath. Educated on use of call light system. Verbalizes understanding of need to call for assistance prior to ambulation when necessary

## 2017-09-28 NOTE — Discharge Summary (Signed)
ELECTROPHYSIOLOGY PROCEDURE DISCHARGE SUMMARY    Patient ID: Frances [name],  MRN: [id number], DOB/AGE: [birth year] 84 y.o.  Admit date: 09/28/2017 Discharge date: 09/29/17  Primary Care Physician: Frances Manes, MD  Primary Cardiologist: Dr. Marlou [name] Electrophysiologist: Frances Grayer, MD  Primary Discharge Diagnosis:  1. Persistent AFib     CHA2DS2Vasc is 4, on Eliquis  Secondary Discharge Diagnosis:  1. HTN 2. Hypothyroidism  Procedures This Admission:  1.  Electrophysiology study and radiofrequency catheter ablation on 09/28/17 by Dr Frances [name].   This study demonstrated   CONCLUSIONS: 1. Atrial fibrillation upon presentation.   2. Intracardiac echo reveals a large sized left atrium with four separate pulmonary veins without evidence of pulmonary vein stenosis. 3.  Return of conduction within only the left superior pulmonary vein. The left inferior and right superior/ right inferior pulmonary veins were quiescent from a prior ablation and did not require additional ablation today.    4. Successful electrical re isolation and anatomical encircling of the left superior pulmonary vein  5. Additional left atrial ablation was performed with a standard box lesion created along the posterior wall of the left atrium 6. Atrial fibrillation successfully cardioverted to sinus rhythm. 7 . No early apparent complications.  Brief HPI: Frances [name] is a 84 y.o. female with a history of persistent atrial fibrillation.  They have failed medical therapy with flecainide. Risks, benefits, and alternatives to catheter ablation of atrial fibrillation were reviewed with the patient who wished to proceed.  The patient underwent TEE prior to the procedure which demonstrated normal LV function and no obvious LAA thrombus.    Hospital Course:  The patient was admitted and underwent EPS/RFCA of atrial fibrillation with details as outlined above.  The patient was monitored on telemetry  overnight which demonstrated sinus rhythm with PACs.  Groin was without complication on the day of discharge.  The patient was examined by Frances [name] and considered to be stable for discharge.  Wound care and restrictions were reviewed with the patient.  The patient will be seen back by Frances [name] in 4 weeks and Dr Frances [name] in 12 weeks for post ablation follow up.    Physical Exam: Vitals:   09/28/17 1620 09/28/17 1720 09/28/17 2058 09/29/17 0500  BP: (!) 143/77 (!) 153/79 (!) 159/82 138/75  Pulse:   95 95  Resp: (!) 25 (!) 21 20 18   Temp:   98.7 F (37.1 C) 98.4 F (36.9 C)  TempSrc:   Oral Oral  SpO2:   95% 93%  Weight:    178 lb 11.2 oz (81.1 kg)  Height:        GEN- The patient is well appearing, alert and oriented x 3 today.   HEENT: normocephalic, atraumatic; sclera clear, conjunctiva pink; hearing intact; oropharynx clear; neck supple  Lungs- CTA b/l, normal work of breathing.  No wheezes, rales, rhonchi Heart- RRR, no murmurs, rubs or gallops  GI- soft, non-tender, non-distended  Extremities- no clubbing, cyanosis, or edema; DP/PT/radial pulses 2+ bilaterally, groin without hematoma/bruit MS- no significant deformity or atrophy Skin- warm and dry, no rash or lesion Psych- euthymic mood, full affect Neuro- strength and sensation are intact   Labs:   Lab Results  Component Value Date   WBC 6.3 09/17/2017   HGB 14.1 09/17/2017   HCT 42.6 09/17/2017   MCV 84 09/17/2017   PLT 251 09/17/2017   No results for input(s): NA, K, CL, CO2, BUN, CREATININE, CALCIUM, PROT, BILITOT, ALKPHOS, ALT, AST,  GLUCOSE in the last 168 hours.  Invalid input(s): LABALBU   Discharge Medications:  Allergies as of 09/29/2017      Reactions   Pneumovax 23 [pneumococcal Vac Polyvalent] Other (See Comments)   Arm swelling & fever   Codeine Nausea And Vomiting   Cephalexin Anxiety   Sudafed [pseudoephedrine Hcl] Anxiety   Sulfa Antibiotics Rash      Medication List    TAKE these  medications   acetaminophen 500 MG tablet Commonly known as:  TYLENOL Take 500-1,000 mg by mouth every 6 (six) hours as needed for moderate pain or headache.   apixaban 5 MG Tabs tablet Commonly known as:  ELIQUIS Take 1 tablet (5 mg total) by mouth 2 (two) times daily.   Biotin 5 MG Caps Take 5 mg by mouth daily.   calcium-vitamin D 500-200 MG-UNIT tablet Take 1 tablet by mouth daily.   diltiazem 180 MG 24 hr capsule Commonly known as:  CARDIZEM CD Take 1 capsule (180 mg total) by mouth daily. What changed:  Another medication with the same name was changed. Make sure you understand how and when to take each.   diltiazem 120 MG 24 hr capsule Commonly known as:  CARDIZEM CD Take 1 capsule (120 mg total) by mouth at bedtime. What changed:  when to take this   diltiazem 30 MG tablet Commonly known as:  CARDIZEM Take 30 mg by mouth every 4 (four) hours as needed (for fast heart rate > 100 as long as BP  > 100).   hydrochlorothiazide 12.5 MG capsule Commonly known as:  MICROZIDE Take 12.5 mg by mouth daily.   levothyroxine 75 MCG tablet Commonly known as:  SYNTHROID, LEVOTHROID Take 75 mcg by mouth at bedtime.   pantoprazole 40 MG tablet Commonly known as:  PROTONIX Take 1 tablet (40 mg total) by mouth daily.   PROBIOTIC PO Take 1 tablet by mouth daily.   psyllium 58.6 % powder Commonly known as:  METAMUCIL Take 1 packet by mouth daily.   ramipril 5 MG capsule Commonly known as:  ALTACE Take 5 mg by mouth at bedtime.   sodium chloride 0.65 % Soln nasal spray Commonly known as:  OCEAN Place 2 sprays into both nostrils as needed for congestion.   vitamin B-12 1000 MCG tablet Commonly known as:  CYANOCOBALAMIN Take 1,000 mcg by mouth daily.       Disposition:  Home   Follow-up Information    [facility] Follow up on 10/28/2017.   Specialty:  Cardiology Why:  11:00AM Contact information: [address] [id number] [place] [phone]       Frances Grayer, MD Follow up on 12/20/2017.   Specialty:  Cardiology Why:  10:00AM Contact information: [place] [address] [phone]           Duration of Discharge Encounter: Greater than 30 minutes including physician time.  Army Fossa MD 09/29/2017 8:37 AM

## 2017-09-28 NOTE — Anesthesia Postprocedure Evaluation (Signed)
Anesthesia Post Note  Patient: Frances [name]  Procedure(s) Performed: ATRIAL FIBRILLATION ABLATION (N/A )     Patient location during evaluation: PACU Anesthesia Type: MAC Level of consciousness: awake and alert Pain management: pain level controlled Vital Signs Assessment: post-procedure vital signs reviewed and stable Respiratory status: spontaneous breathing, nonlabored ventilation, respiratory function stable and patient connected to nasal cannula oxygen Cardiovascular status: stable and blood pressure returned to baseline Postop Assessment: no apparent nausea or vomiting Anesthetic complications: no    Last Vitals:  Vitals:   09/28/17 1035 09/28/17 1040  BP: (!) 142/75   Pulse: 74 73  Resp: 16 12  Temp:    SpO2: 100% 100%    Last Pain:  Vitals:   09/28/17 1023  TempSrc: Temporal                 Hodaya Curto S

## 2017-09-28 NOTE — Anesthesia Preprocedure Evaluation (Signed)
Anesthesia Evaluation  Patient identified by MRN, date of birth, ID band Patient awake    Reviewed: Allergy & Precautions, NPO status , Patient's Chart, lab work & pertinent test results  Airway Mallampati: II  TM Distance: >3 FB Neck ROM: Full    Dental no notable dental hx.    Pulmonary neg pulmonary ROS,    Pulmonary exam normal breath sounds clear to auscultation       Cardiovascular hypertension, + Peripheral Vascular Disease  + dysrhythmias Atrial Fibrillation  Rhythm:Irregular Rate:Normal  Left ventricle: The cavity size was normal. There was mild focal   basal hypertrophy of the septum. Systolic function was normal.   The estimated ejection fraction was in the range of 55% to 60%.   Wall motion was normal; there were no regional wall motion   abnormalities. - Mitral valve: Calcified annulus. There was mild regurgitation. - Left atrium: The atrium was mildly dilated. - Right atrium: The atrium was mildly dilated.  Impressions:  - Normal LV systolic function; mild MR; mild biatrial enlargement   Neuro/Psych negative neurological ROS  negative psych ROS   GI/Hepatic negative GI ROS, Neg liver ROS,   Endo/Other  Hypothyroidism   Renal/GU Renal InsufficiencyRenal disease  negative genitourinary   Musculoskeletal negative musculoskeletal ROS (+)   Abdominal   Peds negative pediatric ROS (+)  Hematology negative hematology ROS (+)   Anesthesia Other Findings   Reproductive/Obstetrics negative OB ROS                             Anesthesia Physical Anesthesia Plan  ASA: III  Anesthesia Plan: MAC   Post-op Pain Management:    Induction: Intravenous  PONV Risk Score and Plan: 2 and Ondansetron and Treatment may vary due to age or medical condition  Airway Management Planned: Simple Face Mask  Additional Equipment:   Intra-op Plan:   Post-operative Plan:   Informed  Consent: I have reviewed the patients History and Physical, chart, labs and discussed the procedure including the risks, benefits and alternatives for the proposed anesthesia with the patient or authorized representative who has indicated his/her understanding and acceptance.   Dental advisory given  Plan Discussed with: CRNA and Surgeon  Anesthesia Plan Comments:         Anesthesia Quick Evaluation

## 2017-09-28 NOTE — Progress Notes (Addendum)
Site area: RFV x3 Site Prior to Removal:  Level 0 Pressure Applied For:25 min Manual:   yes Patient Status During Pull:  stable Post Pull Site:  Level 0 Post Pull Instructions Given:yes   Post Pull Pulses Present:  Dressing Applied:  tegaderm Bedrest begins @ 1200 till 1800 Comments:

## 2017-09-28 NOTE — Interval H&P Note (Signed)
History and Physical Interval Note:  09/28/2017 7:13 AM  Frances [name]  has presented today for surgery, with the diagnosis of afib  The various methods of treatment have been discussed with the patient and family. After consideration of risks, benefits and other options for treatment, the patient has consented to  Procedure(s): ATRIAL FIBRILLATION ABLATION (N/A) as a surgical intervention .  The patient's history has been reviewed, patient examined, no change in status, stable for surgery.  I have reviewed the patient's chart and labs.  Questions were answered to the patient's satisfaction.     [name]

## 2017-09-28 NOTE — Transfer of Care (Signed)
Immediate Anesthesia Transfer of Care Note  Patient: Frances [name]  Procedure(s) Performed: ATRIAL FIBRILLATION ABLATION (N/A )  Patient Location: PACU and Cath Lab  Anesthesia Type:MAC  Level of Consciousness: awake and oriented  Airway & Oxygen Therapy: Patient Spontanous Breathing and Patient connected to nasal cannula oxygen  Post-op Assessment: Report given to RN and Post -op Vital signs reviewed and stable  Post vital signs: Reviewed and stable  Last Vitals:  Vitals:   09/28/17 0536  BP: 140/82  Pulse: 88  Resp: 18  Temp: 36.6 C  SpO2: 98%    Last Pain:  Vitals:   09/28/17 0536  TempSrc: Oral         Complications: No apparent anesthesia complications

## 2017-09-29 DIAGNOSIS — Z882 Allergy status to sulfonamides status: Secondary | ICD-10-CM | POA: Diagnosis not present

## 2017-09-29 DIAGNOSIS — I481 Persistent atrial fibrillation: Secondary | ICD-10-CM

## 2017-09-29 DIAGNOSIS — I129 Hypertensive chronic kidney disease with stage 1 through stage 4 chronic kidney disease, or unspecified chronic kidney disease: Secondary | ICD-10-CM | POA: Diagnosis not present

## 2017-09-29 DIAGNOSIS — I739 Peripheral vascular disease, unspecified: Secondary | ICD-10-CM | POA: Diagnosis not present

## 2017-09-29 DIAGNOSIS — M1990 Unspecified osteoarthritis, unspecified site: Secondary | ICD-10-CM | POA: Diagnosis not present

## 2017-09-29 DIAGNOSIS — Z885 Allergy status to narcotic agent status: Secondary | ICD-10-CM | POA: Diagnosis not present

## 2017-09-29 DIAGNOSIS — Z887 Allergy status to serum and vaccine status: Secondary | ICD-10-CM | POA: Diagnosis not present

## 2017-09-29 DIAGNOSIS — E039 Hypothyroidism, unspecified: Secondary | ICD-10-CM | POA: Diagnosis not present

## 2017-09-29 DIAGNOSIS — Z79899 Other long term (current) drug therapy: Secondary | ICD-10-CM | POA: Diagnosis not present

## 2017-09-29 DIAGNOSIS — Z7901 Long term (current) use of anticoagulants: Secondary | ICD-10-CM | POA: Diagnosis not present

## 2017-09-29 DIAGNOSIS — N183 Chronic kidney disease, stage 3 (moderate): Secondary | ICD-10-CM | POA: Diagnosis not present

## 2017-09-29 DIAGNOSIS — Z79890 Hormone replacement therapy (postmenopausal): Secondary | ICD-10-CM | POA: Diagnosis not present

## 2017-09-29 MED ORDER — PANTOPRAZOLE SODIUM 40 MG PO TBEC: 40.0000 mg | DELAYED_RELEASE_TABLET | Freq: Every day | ORAL | 0 refills | Status: DC

## 2017-09-29 MED ORDER — HYDROCORTISONE 1 % EX CREA
1.0000 "application " | TOPICAL_CREAM | Freq: Three times a day (TID) | CUTANEOUS | Status: DC | PRN
  Administered 2017-09-29: 1 via TOPICAL
  Filled 2017-09-29: qty 28

## 2017-10-01 ENCOUNTER — Ambulatory Visit (INDEPENDENT_AMBULATORY_CARE_PROVIDER_SITE_OTHER): Payer: Medicare Other | Admitting: *Deleted

## 2017-10-01 DIAGNOSIS — I480 Paroxysmal atrial fibrillation: Secondary | ICD-10-CM | POA: Diagnosis not present

## 2017-10-03 ENCOUNTER — Encounter (HOSPITAL_COMMUNITY): Payer: Self-pay | Admitting: Internal Medicine

## 2017-10-04 NOTE — Progress Notes (Signed)
Carelink Summary Report / Loop Recorder 

## 2017-10-12 ENCOUNTER — Telehealth (HOSPITAL_COMMUNITY): Payer: Self-pay | Admitting: *Deleted

## 2017-10-12 LAB — CUP PACEART REMOTE DEVICE CHECK
Date Time Interrogation Session: 20190124050500
MDC IDC PG IMPLANT DT: 20171002

## 2017-10-12 MED ORDER — DILTIAZEM HCL ER COATED BEADS 180 MG PO CP24: 180.0000 mg | ORAL_CAPSULE | Freq: Two times a day (BID) | ORAL | Status: DC

## 2017-10-12 NOTE — Telephone Encounter (Signed)
Pt called in stating she is staying in afib since her ablation 2 weeks ago which she understands may happen but HR especially in the mornings are in the 120-150 range and it just wipes her out. Discussed with Frances Palau NP will increase cardizem to 180mg  twice a day and see for assessment on Friday. Pt verbalized understanding -- she does have 30mg  tabs of cardizem she can take as needed if HR remain elevated.

## 2017-10-14 ENCOUNTER — Other Ambulatory Visit: Payer: Self-pay | Admitting: Internal Medicine

## 2017-10-15 ENCOUNTER — Encounter (HOSPITAL_COMMUNITY): Payer: Self-pay | Admitting: Nurse Practitioner

## 2017-10-15 ENCOUNTER — Ambulatory Visit (HOSPITAL_COMMUNITY)
Admission: RE | Admit: 2017-10-15 | Discharge: 2017-10-15 | Disposition: A | Discharge status: Home / Self Care | Payer: Medicare Other | Source: Ambulatory Visit | Attending: Nurse Practitioner | Admitting: Nurse Practitioner

## 2017-10-15 VITALS — BP 140/88 | HR 92 | Ht 65.0 in | Wt 178.0 lb

## 2017-10-15 DIAGNOSIS — N189 Chronic kidney disease, unspecified: Secondary | ICD-10-CM | POA: Insufficient documentation

## 2017-10-15 DIAGNOSIS — Z7901 Long term (current) use of anticoagulants: Secondary | ICD-10-CM | POA: Diagnosis not present

## 2017-10-15 DIAGNOSIS — I481 Persistent atrial fibrillation: Secondary | ICD-10-CM

## 2017-10-15 DIAGNOSIS — I480 Paroxysmal atrial fibrillation: Secondary | ICD-10-CM | POA: Insufficient documentation

## 2017-10-15 DIAGNOSIS — I129 Hypertensive chronic kidney disease with stage 1 through stage 4 chronic kidney disease, or unspecified chronic kidney disease: Secondary | ICD-10-CM | POA: Diagnosis not present

## 2017-10-15 DIAGNOSIS — Z79899 Other long term (current) drug therapy: Secondary | ICD-10-CM | POA: Diagnosis not present

## 2017-10-15 DIAGNOSIS — I4891 Unspecified atrial fibrillation: Secondary | ICD-10-CM | POA: Diagnosis present

## 2017-10-15 DIAGNOSIS — I739 Peripheral vascular disease, unspecified: Secondary | ICD-10-CM | POA: Diagnosis not present

## 2017-10-15 DIAGNOSIS — Z79890 Hormone replacement therapy (postmenopausal): Secondary | ICD-10-CM | POA: Insufficient documentation

## 2017-10-15 DIAGNOSIS — I4819 Other persistent atrial fibrillation: Secondary | ICD-10-CM

## 2017-10-15 DIAGNOSIS — E039 Hypothyroidism, unspecified: Secondary | ICD-10-CM | POA: Insufficient documentation

## 2017-10-15 LAB — CBC
HEMATOCRIT: 43.4 % (ref 36.0–46.0)
HEMOGLOBIN: 14.2 g/dL (ref 12.0–15.0)
MCH: 27.8 pg (ref 26.0–34.0)
MCHC: 32.7 g/dL (ref 30.0–36.0)
MCV: 84.9 fL (ref 78.0–100.0)
Platelets: 244 10*3/uL (ref 150–400)
RBC: 5.11 MIL/uL (ref 3.87–5.11)
RDW: 14.5 % (ref 11.5–15.5)
WBC: 6.3 10*3/uL (ref 4.0–10.5)

## 2017-10-15 LAB — BASIC METABOLIC PANEL
ANION GAP: 12 (ref 5–15)
BUN: 16 mg/dL (ref 6–20)
CHLORIDE: 105 mmol/L (ref 101–111)
CO2: 22 mmol/L (ref 22–32)
Calcium: 9.4 mg/dL (ref 8.9–10.3)
Creatinine, Ser: 0.93 mg/dL (ref 0.44–1.00)
GFR calc non Af Amer: 56 mL/min — ABNORMAL LOW (ref >60)
Glucose, Bld: 109 mg/dL — ABNORMAL HIGH (ref 65–99)
Potassium: 4.2 mmol/L (ref 3.5–5.1)
Sodium: 139 mmol/L (ref 135–145)

## 2017-10-15 NOTE — Patient Instructions (Signed)
Cardioversion scheduled for Tuesday, February 12th  - Arrive at the [facility] and go to admitting at 9:30AM  -Do not eat or drink anything after midnight the night prior to your procedure.  - Take all your medication with a sip of water prior to arrival.  - You will not be able to drive home after your procedure.

## 2017-10-15 NOTE — Progress Notes (Signed)
Primary Care Physician: Lajean Manes, MD Referring Physician: [name] is a 84 y.o. female with a h/o afib,s/p ablation 11/2015. She had been on flecainide prior to ablation and was failing drug.She had been doing very well until she developed afib  11/19 and it had been persistent. She has been taking an extra 30 mg Cardizem every 4 hours since then but remained in afib today with v rates controlled. No obvious trigger. Discussed pursuing cardioversion but  needed 4 weekly INR's. Pt would rather do this, than to have a TEE guided cardioversion.  Pt had cardioversion 12/18 and enjoyed SR x 2-3 days, but then has been in afib since then. She is back into clinic to discuss options.Continues on warfarin but is interested in working with coumadin clinic at PCP's office to transfer onto eliquis.  F/u in afib clinic s/p second ablation for afib, 09/28/2017. She asked to be seen as she felt that she has been in persistent afib for 2 weeks. She did enjoy 2-3 days of SR after surgery. No missed doses of eliquis for at least 3 weeks.  States that she tolerated the ablation procedure well.  Today, she denies symptoms of palpitations, chest pain, shortness of breath, orthopnea, PND, lower extremity edema, dizziness, presyncope, syncope, or neurologic sequela. The patient is tolerating medications without difficulties and is otherwise without complaint today.   Past Medical History:  Diagnosis Date  . Arthritis    hands, back  . Chronic kidney disease    stage 3 renal disease - no med  . Dysrhythmia    Hx - a-fib 05/2010 - tx with meds, no problem since 05/2010  . Hearing loss    bilateral hearing aids  . Hypertension    controlled with meds  . Hypothyroidism   . Internal hemorrhoid   . Paroxysmal atrial fibrillation (HCC)   . Peripheral vascular disease ([facility])    right arm and right shoulder blood clots r/t a fall  . Rectal bleeding   . SVD (spontaneous vaginal delivery)  1957, 1959   x 2  . Thyroid disease    hypothyroidism   Past Surgical History:  Procedure Laterality Date  . ABDOMINAL HYSTERECTOMY     and right ovary  . ANTERIOR AND POSTERIOR REPAIR N/A 01/03/2014   Procedure: ANTERIOR (CYSTOCELE) REPAIR;  Surgeon: Floyce Stakes. Pamala Hurry, MD;  Location: [facility];  Service: Gynecology;  Laterality: N/A;  . APPENDECTOMY    . ATRIAL FIBRILLATION ABLATION N/A 09/28/2017   Procedure: ATRIAL FIBRILLATION ABLATION;  Surgeon: [name], MD;  Location: [facility];  Service: Cardiovascular;  Laterality: N/A;  . BACK SURGERY     x 3 - 2 rods and 8 screws  . CARDIOVERSION N/A 08/24/2017   Procedure: CARDIOVERSION;  Surgeon: Jerline Pain, MD;  Location: [facility];  Service: Cardiovascular;  Laterality: N/A;  . COLONOSCOPY    . CYSTOCELE REPAIR    . DENTAL SURGERY     infected tooth - general  . DILATION AND CURETTAGE OF UTERUS     x several  . ELECTROPHYSIOLOGIC STUDY N/A 11/28/2015   Procedure: Atrial Fibrillation Ablation;  Surgeon: [name], MD;  Location: [facility];  Service: Cardiovascular;  Laterality: N/A;  . EP IMPLANTABLE DEVICE N/A 06/08/2016   Procedure: Loop Recorder Insertion;  Surgeon: [name], MD;  Location: [facility];  Service: Cardiovascular;  Laterality: N/A;  . EXAMINATION UNDER ANESTHESIA N/A 01/15/2014   Procedure: Jasmine December  UNDER ANESTHESIA with evacuation of hematoma and over sewing of vaginal mucosa.;  Surgeon: Floyce Stakes. Pamala Hurry, MD;  Location: [facility];  Service: Gynecology;  Laterality: N/A;  . HAND SURGERY     left  . hemorrhoid injection  04/2011  . JOINT REPLACEMENT     left thumb replacement  . TEE WITHOUT CARDIOVERSION N/A 09/27/2017   Procedure: TRANSESOPHAGEAL ECHOCARDIOGRAM (TEE);  Surgeon: Sueanne Margarita, MD;  Location: [facility];  Service: Cardiovascular;  Laterality: N/A;  . TONSILLECTOMY      Current Outpatient Medications  Medication Sig Dispense Refill  . acetaminophen (TYLENOL) 500 MG  tablet Take 500-1,000 mg by mouth every 6 (six) hours as needed for moderate pain or headache.    . Biotin 5 MG CAPS Take 5 mg by mouth daily.     Marland Kitchen diltiazem (CARDIZEM CD) 180 MG 24 hr capsule Take 1 capsule (180 mg total) by mouth 2 (two) times daily.    Marland Kitchen diltiazem (CARDIZEM) 30 MG tablet Take 30 mg by mouth every 4 (four) hours as needed (for fast heart rate > 100 as long as BP  > 100).     Marland Kitchen ELIQUIS 5 MG TABS tablet TAKE 1 TABLET BY MOUTH TWICE A DAY (Patient taking differently: TAKE 5 MG BY MOUTH TWICE A DAY) 60 tablet 5  . hydrochlorothiazide (MICROZIDE) 12.5 MG capsule Take 12.5 mg by mouth daily.    Marland Kitchen levothyroxine (SYNTHROID, LEVOTHROID) 75 MCG tablet Take 75 mcg by mouth at bedtime.     . pantoprazole (PROTONIX) 40 MG tablet Take 1 tablet (40 mg total) by mouth daily. 45 tablet 0  . psyllium (METAMUCIL) 58.6 % powder Take 1 packet by mouth daily.    . ramipril (ALTACE) 5 MG capsule Take 5 mg by mouth every evening.     . sodium chloride (OCEAN) 0.65 % SOLN nasal spray Place 2 sprays into both nostrils as needed for congestion.    . vitamin B-12 (CYANOCOBALAMIN) 1000 MCG tablet Take 1,000 mcg by mouth daily.    Marland Kitchen CALCIUM CITRATE PO Take 1 tablet by mouth daily.    . Inulin (FIBER CHOICE PREBIOTIC FIBER PO) Take 1 tablet by mouth 2 (two) times daily.     No current facility-administered medications for this encounter.     Allergies  Allergen Reactions  . Pneumovax 23 [Pneumococcal Vac Polyvalent] Other (See Comments)    Arm swelling & fever  . Codeine Nausea And Vomiting  . Cephalexin Anxiety  . Sudafed [Pseudoephedrine Hcl] Anxiety  . Sulfa Antibiotics Rash    Social History   Socioeconomic History  . Marital status: Married    Spouse name: Not on file  . Number of children: Not on file  . Years of education: Not on file  . Highest education level: Not on file  Social Needs  . Financial resource strain: Not on file  . Food insecurity - worry: Not on file  . Food  insecurity - inability: Not on file  . Transportation needs - medical: Not on file  . Transportation needs - non-medical: Not on file  Occupational History  . Not on file  Tobacco Use  . Smoking status: Never Smoker  . Smokeless tobacco: Never Used  Substance and Sexual Activity  . Alcohol use: No  . Drug use: No  . Sexual activity: Not Currently    Birth control/protection: Post-menopausal  Other Topics Concern  . Not on file  Social History Narrative  . Not on file  Family History  Problem Relation Age of Onset  . Heart disease Mother   . Heart attack Mother   . Hypertension Father   . Diabetes Father   . Stroke Father   . Heart disease Sister   . Hypertension Sister   . Heart disease Brother   . Sudden death Maternal Grandfather   . Heart attack Sister   . Heart failure Brother     ROS- All systems are reviewed and negative except as per the HPI above  Physical Exam: Vitals:   10/15/17 1000  BP: 140/88  Pulse: 92  Weight: 178 lb (80.7 kg)  Height: 5\' 5"  (1.651 m)   Wt Readings from Last 3 Encounters:  10/15/17 178 lb (80.7 kg)  09/29/17 178 lb 11.2 oz (81.1 kg)  09/17/17 180 lb 6.4 oz (81.8 kg)    Labs: Lab Results  Component Value Date   NA 139 10/15/2017   K 4.2 10/15/2017   CL 105 10/15/2017   CO2 22 10/15/2017   GLUCOSE 109 (H) 10/15/2017   BUN 16 10/15/2017   CREATININE 0.93 10/15/2017   CALCIUM 9.4 10/15/2017   Lab Results  Component Value Date   INR 2.05 08/24/2017   No results found for: CHOL, HDL, LDLCALC, TRIG   GEN- The patient is well appearing, alert and oriented x 3 today.   Head- normocephalic, atraumatic Eyes-  Sclera clear, conjunctiva pink Ears- hearing intact Oropharynx- clear Neck- supple, no JVP Lymph- no cervical lymphadenopathy Lungs- Clear to ausculation bilaterally, normal work of breathing Heart- irregular rate and rhythm, no murmurs, rubs or gallops, PMI not laterally displaced GI- soft, NT, ND, +  BS Extremities- no clubbing, cyanosis, or edema MS- no significant deformity or atrophy Skin- no rash or lesion Psych- euthymic mood, full affect Neuro- strength and sensation are intact  EKG-afib at 92 bpm, qrs int 80 ms, qtc 442 ms Epic records reviewed    Assessment and Plan: 1. Afib S/p ablation 11/28/2015 and 09/28/2017 Return of afib present x 2 weeks after ablation Discussed cardioversion and she would like to proceed Has used flecainide in the past without maintaining SR No missed doses of eliquis, chadsvasc score of at least 4 Bmet/cbc today  F/u one week after cardioversion   Butch Penny C. Brayton Baumgartner, [facility] [address] [place], [place] [address] [phone]

## 2017-10-15 NOTE — H&P (View-Only) (Signed)
Primary Care Physician: Frances Manes, MD Referring Physician: [name] is a 84 y.o. female with a h/o afib,s/p ablation 11/2015. She had been on flecainide prior to ablation and was failing drug.She had been doing very well until she developed afib  11/19 and it had been persistent. She has been taking an extra 30 mg Cardizem every 4 hours since then but remained in afib today with v rates controlled. No obvious trigger. Discussed pursuing cardioversion but  needed 4 weekly INR's. Pt would rather do this, than to have a TEE guided cardioversion.  Pt had cardioversion 12/18 and enjoyed SR x 2-3 days, but then has been in afib since then. She is back into clinic to discuss options.Continues on warfarin but is interested in working with coumadin clinic at PCP's office to transfer onto eliquis.  F/u in afib clinic s/p second ablation for afib, 09/28/2017. She asked to be seen as she felt that she has been in persistent afib for 2 weeks. She did enjoy 2-3 days of SR after surgery. No missed doses of eliquis for at least 3 weeks.  States that she tolerated the ablation procedure well.  Today, she denies symptoms of palpitations, chest [name], shortness of breath, orthopnea, PND, lower extremity edema, dizziness, presyncope, syncope, or neurologic sequela. The patient is tolerating medications without difficulties and is otherwise without complaint today.   Past Medical History:  Diagnosis Date  . Arthritis    hands, back  . Chronic kidney disease    stage 3 renal disease - no med  . Dysrhythmia    Hx - a-fib 05/2010 - tx with meds, no problem since 05/2010  . Hearing loss    bilateral hearing aids  . Hypertension    controlled with meds  . Hypothyroidism   . Internal hemorrhoid   . Paroxysmal atrial fibrillation (HCC)   . Peripheral vascular disease ([place])    right arm and right shoulder blood clots r/t a fall  . Rectal bleeding   . SVD (spontaneous vaginal delivery)  1957, 1959   x 2  . Thyroid disease    hypothyroidism   Past Surgical History:  Procedure Laterality Date  . ABDOMINAL HYSTERECTOMY     and right ovary  . ANTERIOR AND POSTERIOR REPAIR N/A 01/03/2014   Procedure: ANTERIOR (CYSTOCELE) REPAIR;  Surgeon: Frances [name]. Frances Hurry, MD;  Location: [facility];  Service: Gynecology;  Laterality: N/A;  . APPENDECTOMY    . ATRIAL FIBRILLATION ABLATION N/A 09/28/2017   Procedure: ATRIAL FIBRILLATION ABLATION;  Surgeon: Frances Grayer, MD;  Location: [facility] [facility];  Service: Cardiovascular;  Laterality: N/A;  . BACK SURGERY     x 3 - 2 rods and 8 screws  . CARDIOVERSION N/A 08/24/2017   Procedure: CARDIOVERSION;  Surgeon: Frances Pain, MD;  Location: [facility];  Service: Cardiovascular;  Laterality: N/A;  . COLONOSCOPY    . CYSTOCELE REPAIR    . DENTAL SURGERY     infected tooth - general  . DILATION AND CURETTAGE OF UTERUS     x several  . ELECTROPHYSIOLOGIC STUDY N/A 11/28/2015   Procedure: Atrial Fibrillation Ablation;  Surgeon: Frances Grayer, MD;  Location: [place] [facility];  Service: Cardiovascular;  Laterality: N/A;  . EP IMPLANTABLE DEVICE N/A 06/08/2016   Procedure: Loop Recorder Insertion;  Surgeon: Frances Grayer, MD;  Location: [facility] [facility];  Service: Cardiovascular;  Laterality: N/A;  . EXAMINATION UNDER ANESTHESIA N/A 01/15/2014   Procedure: Frances [name]  UNDER ANESTHESIA with evacuation of hematoma and over sewing of vaginal mucosa.;  Surgeon: Frances [name]. Frances Hurry, MD;  Location: [facility];  Service: Gynecology;  Laterality: N/A;  . HAND SURGERY     left  . hemorrhoid injection  04/2011  . JOINT REPLACEMENT     left thumb replacement  . TEE WITHOUT CARDIOVERSION N/A 09/27/2017   Procedure: TRANSESOPHAGEAL ECHOCARDIOGRAM (TEE);  Surgeon: Frances Margarita, MD;  Location: [facility] ENDOSCOPY;  Service: Cardiovascular;  Laterality: N/A;  . TONSILLECTOMY      Current Outpatient Medications  Medication Sig Dispense Refill  . acetaminophen (TYLENOL) 500 MG  tablet Take 500-1,000 mg by mouth every 6 (six) hours as needed for moderate [name] or headache.    . Biotin 5 MG CAPS Take 5 mg by mouth daily.     Marland Kitchen diltiazem (CARDIZEM CD) 180 MG 24 hr capsule Take 1 capsule (180 mg total) by mouth 2 (two) times daily.    Marland Kitchen diltiazem (CARDIZEM) 30 MG tablet Take 30 mg by mouth every 4 (four) hours as needed (for fast heart rate > 100 as long as BP  > 100).     Marland Kitchen ELIQUIS 5 MG TABS tablet TAKE 1 TABLET BY MOUTH TWICE A DAY (Patient taking differently: TAKE 5 MG BY MOUTH TWICE A DAY) 60 tablet 5  . hydrochlorothiazide (MICROZIDE) 12.5 MG capsule Take 12.5 mg by mouth daily.    Marland Kitchen levothyroxine (SYNTHROID, LEVOTHROID) 75 MCG tablet Take 75 mcg by mouth at bedtime.     . pantoprazole (PROTONIX) 40 MG tablet Take 1 tablet (40 mg total) by mouth daily. 45 tablet 0  . psyllium (METAMUCIL) 58.6 % powder Take 1 packet by mouth daily.    . ramipril (ALTACE) 5 MG capsule Take 5 mg by mouth every evening.     . sodium chloride (OCEAN) 0.65 % SOLN nasal spray Place 2 sprays into both nostrils as needed for congestion.    . vitamin B-12 (CYANOCOBALAMIN) 1000 MCG tablet Take 1,000 mcg by mouth daily.    Marland Kitchen CALCIUM CITRATE PO Take 1 tablet by mouth daily.    . Inulin (FIBER CHOICE PREBIOTIC FIBER PO) Take 1 tablet by mouth 2 (two) times daily.     No current facility-administered medications for this encounter.     Allergies  Allergen Reactions  . Pneumovax 23 [Pneumococcal Vac Polyvalent] Other (See Comments)    Arm swelling & fever  . Codeine Nausea And Vomiting  . Cephalexin Anxiety  . Sudafed [Pseudoephedrine Hcl] Anxiety  . Sulfa Antibiotics Rash    Social History   Socioeconomic History  . Marital status: Married    Spouse name: Not on file  . Number of children: Not on file  . Years of education: Not on file  . Highest education level: Not on file  Social Needs  . Financial resource strain: Not on file  . Food insecurity - worry: Not on file  . Food  insecurity - inability: Not on file  . Transportation needs - medical: Not on file  . Transportation needs - non-medical: Not on file  Occupational History  . Not on file  Tobacco Use  . Smoking status: Never Smoker  . Smokeless tobacco: Never Used  Substance and Sexual Activity  . Alcohol use: No  . Drug use: No  . Sexual activity: Not Currently    Birth control/protection: Post-menopausal  Other Topics Concern  . Not on file  Social History Narrative  . Not on file  Family History  Problem Relation Age of Onset  . Heart disease Mother   . Heart attack Mother   . Hypertension Father   . Diabetes Father   . Stroke Father   . Heart disease Sister   . Hypertension Sister   . Heart disease Brother   . Sudden death Maternal Grandfather   . Heart attack Sister   . Heart failure Brother     ROS- All systems are reviewed and negative except as per the HPI above  Physical Exam: Vitals:   10/15/17 1000  BP: 140/88  Pulse: 92  Weight: 178 lb (80.7 kg)  Height: 5\' 5"  (1.651 m)   Wt Readings from Last 3 Encounters:  10/15/17 178 lb (80.7 kg)  09/29/17 178 lb 11.2 oz (81.1 kg)  09/17/17 180 lb 6.4 oz (81.8 kg)    Labs: Lab Results  Component Value Date   NA 139 10/15/2017   K 4.2 10/15/2017   CL 105 10/15/2017   CO2 22 10/15/2017   GLUCOSE 109 (H) 10/15/2017   BUN 16 10/15/2017   CREATININE 0.93 10/15/2017   CALCIUM 9.4 10/15/2017   Lab Results  Component Value Date   INR 2.05 08/24/2017   No results found for: CHOL, HDL, LDLCALC, TRIG   GEN- The patient is well appearing, alert and oriented x 3 today.   Head- normocephalic, atraumatic Eyes-  Sclera clear, conjunctiva pink Ears- hearing intact Oropharynx- clear Neck- supple, no JVP Lymph- no cervical lymphadenopathy Lungs- Clear to ausculation bilaterally, normal work of breathing Heart- irregular rate and rhythm, no murmurs, rubs or gallops, PMI not laterally displaced GI- soft, NT, ND, +  BS Extremities- no clubbing, cyanosis, or edema MS- no significant deformity or atrophy Skin- no rash or lesion Psych- euthymic mood, full affect Neuro- strength and sensation are intact  EKG-afib at 92 bpm, qrs int 80 ms, qtc 442 ms Epic records reviewed    Assessment and Plan: 1. Afib S/p ablation 11/28/2015 and 09/28/2017 Return of afib present x 2 weeks after ablation Discussed cardioversion and she would like to proceed Has used flecainide in the past without maintaining SR No missed doses of eliquis, chadsvasc score of at least 4 Bmet/cbc today  F/u one week after cardioversion   Butch Penny C. Carroll, [facility] [address] [place], [place] [address] [phone]

## 2017-10-19 ENCOUNTER — Ambulatory Visit (HOSPITAL_COMMUNITY): Payer: Medicare Other | Admitting: Anesthesiology

## 2017-10-19 ENCOUNTER — Ambulatory Visit (HOSPITAL_COMMUNITY)
Admission: RE | Admit: 2017-10-19 | Discharge: 2017-10-19 | Disposition: A | Discharge status: Home / Self Care | Payer: Medicare Other | Source: Ambulatory Visit | Attending: Cardiovascular Disease | Admitting: Cardiovascular Disease

## 2017-10-19 ENCOUNTER — Other Ambulatory Visit: Payer: Self-pay

## 2017-10-19 ENCOUNTER — Encounter (HOSPITAL_COMMUNITY): Payer: Self-pay | Admitting: *Deleted

## 2017-10-19 ENCOUNTER — Encounter (HOSPITAL_COMMUNITY)
Admission: RE | Disposition: A | Discharge status: Home / Self Care | Payer: Self-pay | Source: Ambulatory Visit | Attending: Cardiovascular Disease

## 2017-10-19 DIAGNOSIS — Z79899 Other long term (current) drug therapy: Secondary | ICD-10-CM | POA: Insufficient documentation

## 2017-10-19 DIAGNOSIS — Z823 Family history of stroke: Secondary | ICD-10-CM | POA: Insufficient documentation

## 2017-10-19 DIAGNOSIS — Z833 Family history of diabetes mellitus: Secondary | ICD-10-CM | POA: Diagnosis not present

## 2017-10-19 DIAGNOSIS — M1990 Unspecified osteoarthritis, unspecified site: Secondary | ICD-10-CM | POA: Diagnosis not present

## 2017-10-19 DIAGNOSIS — Z8249 Family history of ischemic heart disease and other diseases of the circulatory system: Secondary | ICD-10-CM | POA: Insufficient documentation

## 2017-10-19 DIAGNOSIS — Z881 Allergy status to other antibiotic agents status: Secondary | ICD-10-CM | POA: Insufficient documentation

## 2017-10-19 DIAGNOSIS — N183 Chronic kidney disease, stage 3 (moderate): Secondary | ICD-10-CM | POA: Diagnosis not present

## 2017-10-19 DIAGNOSIS — Z885 Allergy status to narcotic agent status: Secondary | ICD-10-CM | POA: Insufficient documentation

## 2017-10-19 DIAGNOSIS — E039 Hypothyroidism, unspecified: Secondary | ICD-10-CM | POA: Diagnosis not present

## 2017-10-19 DIAGNOSIS — I739 Peripheral vascular disease, unspecified: Secondary | ICD-10-CM | POA: Insufficient documentation

## 2017-10-19 DIAGNOSIS — I129 Hypertensive chronic kidney disease with stage 1 through stage 4 chronic kidney disease, or unspecified chronic kidney disease: Secondary | ICD-10-CM | POA: Diagnosis not present

## 2017-10-19 DIAGNOSIS — I480 Paroxysmal atrial fibrillation: Secondary | ICD-10-CM

## 2017-10-19 DIAGNOSIS — Z7901 Long term (current) use of anticoagulants: Secondary | ICD-10-CM | POA: Diagnosis not present

## 2017-10-19 DIAGNOSIS — N189 Chronic kidney disease, unspecified: Secondary | ICD-10-CM | POA: Diagnosis not present

## 2017-10-19 HISTORY — PX: CARDIOVERSION: SHX1299

## 2017-10-19 SURGERY — CARDIOVERSION
Anesthesia: General

## 2017-10-19 MED ORDER — SILVER SULFADIAZINE 1 % EX CREA
TOPICAL_CREAM | Freq: Once | CUTANEOUS | Status: DC
  Filled 2017-10-19: qty 85

## 2017-10-19 MED ORDER — LIDOCAINE 2% (20 MG/ML) 5 ML SYRINGE
INTRAMUSCULAR | Status: DC | PRN
  Administered 2017-10-19: 60 mg via INTRAVENOUS

## 2017-10-19 MED ORDER — SODIUM CHLORIDE 0.9 % IV SOLN
INTRAVENOUS | Status: AC | PRN
  Administered 2017-10-19: 500 mL via INTRAVENOUS

## 2017-10-19 MED ORDER — PROPOFOL 10 MG/ML IV BOLUS
INTRAVENOUS | Status: DC | PRN
  Administered 2017-10-19: 80 mg via INTRAVENOUS

## 2017-10-19 NOTE — Discharge Instructions (Signed)
Electrical Cardioversion, Care After °This sheet gives you information about how to care for yourself after your procedure. Your health care provider may also give you more specific instructions. If you have problems or questions, contact your health care provider. °What can I expect after the procedure? °After the procedure, it is common to have: °· Some redness on the skin where the shocks were given. ° °Follow these instructions at home: °· Do not drive for 24 hours if you were given a medicine to help you relax (sedative). °· Take over-the-counter and prescription medicines only as told by your health care provider. °· Ask your health care provider how to check your pulse. Check it often. °· Rest for 48 hours after the procedure or as told by your health care provider. °· Avoid or limit your caffeine use as told by your health care provider. °Contact a health care provider if: °· You feel like your heart is beating too quickly or your pulse is not regular. °· You have a serious muscle cramp that does not go away. °Get help right away if: °· You have discomfort in your chest. °· You are dizzy or you feel faint. °· You have trouble breathing or you are short of breath. °· Your speech is slurred. °· You have trouble moving an arm or leg on one side of your body. °· Your fingers or toes turn cold or blue. °This information is not intended to replace advice given to you by your health care provider. Make sure you discuss any questions you have with your health care provider. °Document Released: 06/14/2013 Document Revised: 03/27/2016 Document Reviewed: 02/28/2016 °Elsevier Interactive Patient Education © 2018 [employer] ° °

## 2017-10-19 NOTE — Transfer of Care (Signed)
Immediate Anesthesia Transfer of Care Note  Patient: Frances [name]  Procedure(s) Performed: CARDIOVERSION (N/A )  Patient Location: Endoscopy Unit  Anesthesia Type:General  Level of Consciousness: drowsy  Airway & Oxygen Therapy: Patient Spontanous Breathing and Patient connected to face mask oxygen  Post-op Assessment: Report given to RN and Post -op Vital signs reviewed and stable  Post vital signs: Reviewed and stable  Last Vitals:  Vitals:   10/19/17 0943  BP: (!) 161/87  Resp: 18  Temp: 36.6 C  SpO2: 94%    Last Pain:  Vitals:   10/19/17 0943  TempSrc: Oral         Complications: No apparent anesthesia complications

## 2017-10-19 NOTE — Anesthesia Postprocedure Evaluation (Signed)
Anesthesia Post Note  Patient: Frances [name]  Procedure(s) Performed: CARDIOVERSION (N/A )     Patient location during evaluation: PACU Anesthesia Type: General Level of consciousness: awake and alert Pain management: pain level controlled Vital Signs Assessment: post-procedure vital signs reviewed and stable Respiratory status: spontaneous breathing, nonlabored ventilation and respiratory function stable Cardiovascular status: blood pressure returned to baseline and stable Postop Assessment: no apparent nausea or vomiting Anesthetic complications: no    Last Vitals:  Vitals:   10/19/17 1100 10/19/17 1110  BP: (!) 156/79 (!) 155/85  Pulse: 78 76  Resp: 20 17  Temp:    SpO2: 93% 93%    Last Pain:  Vitals:   10/19/17 1058  TempSrc: Oral                 Brier Reid,W. EDMOND

## 2017-10-19 NOTE — Anesthesia Procedure Notes (Signed)
Procedure Name: General with mask airway Date/Time: 10/19/2017 10:49 AM Performed by: Imagene Riches, CRNA Pre-anesthesia Checklist: Patient identified, Emergency Drugs available, Suction available, Patient being monitored and Timeout performed Patient Re-evaluated:Patient Re-evaluated prior to induction Oxygen Delivery Method: Simple face mask Preoxygenation: Pre-oxygenation with 100% oxygen

## 2017-10-19 NOTE — Anesthesia Preprocedure Evaluation (Addendum)
Anesthesia Evaluation  Patient identified by MRN, date of birth, ID band Patient awake    Reviewed: Allergy & Precautions, H&P , NPO status , Patient's Chart, lab work & pertinent test results  Airway Mallampati: II  TM Distance: >3 FB Neck ROM: Full    Dental no notable dental hx. (+) Teeth Intact, Dental Advisory Given   Pulmonary asthma ,    Pulmonary exam normal breath sounds clear to auscultation       Cardiovascular hypertension, Pt. on medications + Peripheral Vascular Disease  + dysrhythmias Atrial Fibrillation  Rhythm:Irregular Rate:Normal     Neuro/Psych negative neurological ROS  negative psych ROS   GI/Hepatic negative GI ROS, Neg liver ROS,   Endo/Other  Hypothyroidism   Renal/GU Renal InsufficiencyRenal disease  negative genitourinary   Musculoskeletal  (+) Arthritis , Osteoarthritis,    Abdominal   Peds  Hematology negative hematology ROS (+)   Anesthesia Other Findings   Reproductive/Obstetrics negative OB ROS                            Anesthesia Physical Anesthesia Plan  ASA: III  Anesthesia Plan: General   Post-op Pain Management:    Induction: Intravenous  PONV Risk Score and Plan: 3 and Treatment may vary due to age or medical condition  Airway Management Planned: Mask  Additional Equipment:   Intra-op Plan:   Post-operative Plan:   Informed Consent: I have reviewed the patients History and Physical, chart, labs and discussed the procedure including the risks, benefits and alternatives for the proposed anesthesia with the patient or authorized representative who has indicated his/her understanding and acceptance.   Dental advisory given  Plan Discussed with: CRNA  Anesthesia Plan Comments:         Anesthesia Quick Evaluation

## 2017-10-19 NOTE — Interval H&P Note (Signed)
History and Physical Interval Note:  10/19/2017 9:44 AM  Frances [name]  has presented today for surgery, with the diagnosis of AFIB  The various methods of treatment have been discussed with the patient and family. After consideration of risks, benefits and other options for treatment, the patient has consented to  Procedure(s): CARDIOVERSION (N/A) as a surgical intervention .  The patient's history has been reviewed, patient examined, no change in status, stable for surgery.  I have reviewed the patient's chart and labs.  Questions were answered to the patient's satisfaction.     Jenkins Rouge

## 2017-10-19 NOTE — CV Procedure (Signed)
[facility]: Anesthesia:  Dr Ola Spurr Propofol/Lidocaine  [place] x 3 150 J then 200J x 2  Converted from afib rate 90-100 to NSR rate 88 On Rx Eliquis   No immediate neurologic sequelae  Jenkins Rouge

## 2017-10-19 NOTE — Addendum Note (Signed)
Addendum  created 10/19/17 1337 by Imagene Riches, CRNA   Intraprocedure Flowsheets edited

## 2017-10-20 ENCOUNTER — Encounter (HOSPITAL_COMMUNITY): Payer: Self-pay | Admitting: Cardiovascular Disease

## 2017-10-20 LAB — CUP PACEART INCLINIC DEVICE CHECK
Date Time Interrogation Session: 20190111145722
MDC IDC PG IMPLANT DT: 20171002

## 2017-10-28 ENCOUNTER — Encounter (HOSPITAL_COMMUNITY): Payer: Self-pay | Admitting: Nurse Practitioner

## 2017-10-28 ENCOUNTER — Ambulatory Visit (HOSPITAL_COMMUNITY)
Admission: RE | Admit: 2017-10-28 | Discharge: 2017-10-28 | Disposition: A | Discharge status: Home / Self Care | Payer: Medicare Other | Source: Ambulatory Visit | Attending: Nurse Practitioner | Admitting: Nurse Practitioner

## 2017-10-28 VITALS — BP 120/66 | HR 78 | Ht 65.0 in | Wt 178.0 lb

## 2017-10-28 DIAGNOSIS — N183 Chronic kidney disease, stage 3 (moderate): Secondary | ICD-10-CM | POA: Diagnosis not present

## 2017-10-28 DIAGNOSIS — I739 Peripheral vascular disease, unspecified: Secondary | ICD-10-CM | POA: Insufficient documentation

## 2017-10-28 DIAGNOSIS — I129 Hypertensive chronic kidney disease with stage 1 through stage 4 chronic kidney disease, or unspecified chronic kidney disease: Secondary | ICD-10-CM | POA: Insufficient documentation

## 2017-10-28 DIAGNOSIS — I481 Persistent atrial fibrillation: Secondary | ICD-10-CM

## 2017-10-28 DIAGNOSIS — I480 Paroxysmal atrial fibrillation: Secondary | ICD-10-CM | POA: Diagnosis not present

## 2017-10-28 DIAGNOSIS — Z79899 Other long term (current) drug therapy: Secondary | ICD-10-CM | POA: Insufficient documentation

## 2017-10-28 DIAGNOSIS — Z7901 Long term (current) use of anticoagulants: Secondary | ICD-10-CM | POA: Diagnosis not present

## 2017-10-28 DIAGNOSIS — E039 Hypothyroidism, unspecified: Secondary | ICD-10-CM | POA: Diagnosis not present

## 2017-10-28 DIAGNOSIS — I4819 Other persistent atrial fibrillation: Secondary | ICD-10-CM

## 2017-10-28 NOTE — Progress Notes (Signed)
Primary Care Physician: Lajean Manes, MD Referring Physician: Dr. Marva Panda is a 84 y.o. female with a h/o afib,s/p ablation 11/2015. She had been on flecainide prior to ablation and was failing drug.She had been doing very well until she developed afib  07/2017 and it had been persistent. She has been taking an extra 30 mg Cardizem every 4 hours since then but remained in afib today with v rates controlled. No obvious trigger. Discussed pursuing cardioversion but  needed 4 weekly INR's. Pt would rather do this, than to have a TEE guided cardioversion.  Pt had cardioversion 12/18 and enjoyed SR x 2-3 days, but then has been in afib since then. She is back into clinic to discuss options.Continues on warfarin but is interested in working with coumadin clinic at PCP's office to transfer onto eliquis. She was referred back to Dr. Rayann Heman ot discuss second ablation.  F/u in afib clinic s/p second ablation for afib, 09/28/2017. She asked to be seen as she felt that she has been in persistent afib for 2 weeks. She did enjoy 2-3 days of SR after surgery. No missed doses of eliquis for at least 3 weeks. States that she tolerated the ablation procedure well.  F/u one month ablation, 2/21 and also cardioversion that was scheduled on last visit. Cardioversion was successful and she remains in SR today. She feels improved.  Today, she denies symptoms of palpitations, chest pain, shortness of breath, orthopnea, PND, lower extremity edema, dizziness, presyncope, syncope, or neurologic sequela. The patient is tolerating medications without difficulties and is otherwise without complaint today.   Past Medical History:  Diagnosis Date  . Arthritis    hands, back  . Chronic kidney disease    stage 3 renal disease - no med  . Dysrhythmia    Hx - a-fib 05/2010 - tx with meds, no problem since 05/2010  . Hearing loss    bilateral hearing aids  . Hypertension    controlled with meds  .  Hypothyroidism   . Internal hemorrhoid   . Paroxysmal atrial fibrillation (HCC)   . Peripheral vascular disease ([place])    right arm and right shoulder blood clots r/t a fall  . Rectal bleeding   . SVD (spontaneous vaginal delivery) 1957, 1959   x 2  . Thyroid disease    hypothyroidism   Past Surgical History:  Procedure Laterality Date  . ABDOMINAL HYSTERECTOMY     and right ovary  . ANTERIOR AND POSTERIOR REPAIR N/A 01/03/2014   Procedure: ANTERIOR (CYSTOCELE) REPAIR;  Surgeon: Floyce Stakes. Pamala Hurry, MD;  Location: [facility];  Service: Gynecology;  Laterality: N/A;  . APPENDECTOMY    . ATRIAL FIBRILLATION ABLATION N/A 09/28/2017   Procedure: ATRIAL FIBRILLATION ABLATION;  Surgeon: [name], MD;  Location: [place] [facility];  Service: Cardiovascular;  Laterality: N/A;  . BACK SURGERY     x 3 - 2 rods and 8 screws  . CARDIOVERSION N/A 08/24/2017   Procedure: CARDIOVERSION;  Surgeon: Jerline Pain, MD;  Location: [facility] END[facility]COPY;  Service: Cardiovascular;  Laterality: N/A;  . CARDIOVERSION N/A 10/19/2017   Procedure: CARDIOVERSION;  Surgeon: Josue Hector, MD;  Location[facility]COPY;  Service: Cardiovascular;  Laterality: N/A;  . COLONOSCOPY    . CYSTOCELE REPAIR    . DENTAL SURGERY     infected tooth - general  . DILATION AND CURETTAGE OF UTERUS     x several  . ELECTROPHYSIOLOGIC STUDY N/A 11/28/2015  Procedure: Atrial Fibrillation Ablation;  Surgeon: [name], MD;  Location: [place] [facility];  Service: Cardiovascular;  Laterality: N/A;  . EP IMPLANTABLE DEVICE N/A 06/08/2016   Procedure: Loop Recorder Insertion;  Surgeon: [name], MD;  Location: [place] [facility];  Service: Cardiovascular;  Laterality: N/A;  . EXAMINATION UNDER ANESTHESIA N/A 01/15/2014   Procedure: EXAM UNDER ANESTHESIA with evacuation of hematoma and over sewing of vaginal mucosa.;  Surgeon: Floyce Stakes. Pamala Hurry, MD;  Location: [place] [facility];  Service: Gynecology;  Laterality: N/A;  . HAND SURGERY     left  .  hemorrhoid injection  04/2011  . JOINT REPLACEMENT     left thumb replacement  . TEE WITHOUT CARDIOVERSION N/A 09/27/2017   Procedure: TRANSESOPHAGEAL ECHOCARDIOGRAM (TEE);  Surgeon: Sueanne Margarita, MD;  Location: [facility] ENDOSCOPY;  Service: Cardiovascular;  Laterality: N/A;  . TONSILLECTOMY      Current Outpatient Medications  Medication Sig Dispense Refill  . acetaminophen (TYLENOL) 500 MG tablet Take 500-1,000 mg by mouth every 6 (six) hours as needed for moderate pain or headache.    . Biotin 5 MG CAPS Take 5 mg by mouth daily.     Marland Kitchen CALCIUM CITRATE PO Take 1 tablet by mouth daily.    Marland Kitchen diltiazem (CARDIZEM CD) 180 MG 24 hr capsule Take 1 capsule (180 mg total) by mouth 2 (two) times daily.    Marland Kitchen ELIQUIS 5 MG TABS tablet TAKE 1 TABLET BY MOUTH TWICE A DAY (Patient taking differently: TAKE 5 MG BY MOUTH TWICE A DAY) 60 tablet 5  . hydrochlorothiazide (MICROZIDE) 12.5 MG capsule Take 12.5 mg by mouth daily.    . Inulin (FIBER CHOICE PREBIOTIC FIBER PO) Take 1 tablet by mouth 2 (two) times daily.    Marland Kitchen levothyroxine (SYNTHROID, LEVOTHROID) 75 MCG tablet Take 75 mcg by mouth at bedtime.     . pantoprazole (PROTONIX) 40 MG tablet Take 1 tablet (40 mg total) by mouth daily. 45 tablet 0  . psyllium (METAMUCIL) 58.6 % powder Take 1 packet by mouth daily.    . ramipril (ALTACE) 5 MG capsule Take 5 mg by mouth every evening.     . sodium chloride (OCEAN) 0.65 % SOLN nasal spray Place 2 sprays into both nostrils as needed for congestion.    . vitamin B-12 (CYANOCOBALAMIN) 1000 MCG tablet Take 1,000 mcg by mouth daily.    Marland Kitchen diltiazem (CARDIZEM) 30 MG tablet Take 30 mg by mouth every 4 (four) hours as needed (for fast heart rate > 100 as long as BP  > 100).      No current facility-administered medications for this encounter.     Allergies  Allergen Reactions  . Pneumovax 23 [Pneumococcal Vac Polyvalent] Other (See Comments)    Arm swelling & fever  . Codeine Nausea And Vomiting  . Cephalexin Anxiety   . Sudafed [Pseudoephedrine Hcl] Anxiety  . Sulfa Antibiotics Rash    Social History   Socioeconomic History  . Marital status: Married    Spouse name: Not on file  . Number of children: Not on file  . Years of education: Not on file  . Highest education level: Not on file  Social Needs  . Financial resource strain: Not on file  . Food insecurity - worry: Not on file  . Food insecurity - inability: Not on file  . Transportation needs - medical: Not on file  . Transportation needs - non-medical: Not on file  Occupational History  . Not on file  Tobacco Use  . Smoking status: Never Smoker  . Smokeless tobacco: Never Used  Substance and Sexual Activity  . Alcohol use: No  . Drug use: No  . Sexual activity: Not Currently    Birth control/protection: Post-menopausal  Other Topics Concern  . Not on file  Social History Narrative  . Not on file    Family History  Problem Relation Age of Onset  . Heart disease Mother   . Heart attack Mother   . Hypertension Father   . Diabetes Father   . Stroke Father   . Heart disease Sister   . Hypertension Sister   . Heart disease Brother   . Sudden death Maternal Grandfather   . Heart attack Sister   . Heart failure Brother     ROS- All systems are reviewed and negative except as per the HPI above  Physical Exam: Vitals:   10/28/17 1057  BP: 120/66  Pulse: 78  Weight: 178 lb (80.7 kg)  Height: 5\' 5"  (1.651 m)   Wt Readings from Last 3 Encounters:  10/28/17 178 lb (80.7 kg)  10/19/17 178 lb (80.7 kg)  10/15/17 178 lb (80.7 kg)    Labs: Lab Results  Component Value Date   NA 139 10/15/2017   K 4.2 10/15/2017   CL 105 10/15/2017   CO2 22 10/15/2017   GLUCOSE 109 (H) 10/15/2017   BUN 16 10/15/2017   CREATININE 0.93 10/15/2017   CALCIUM 9.4 10/15/2017   Lab Results  Component Value Date   INR 2.05 08/24/2017   No results found for: CHOL, HDL, LDLCALC, TRIG   GEN- The patient is well appearing, alert and  oriented x 3 today.   Head- normocephalic, atraumatic Eyes-  Sclera clear, conjunctiva pink Ears- hearing intact Oropharynx- clear Neck- supple, no JVP Lymph- no cervical lymphadenopathy Lungs- Clear to ausculation bilaterally, normal work of breathing Heart- regular rate and rhythm, no murmurs, rubs or gallops, PMI not laterally displaced GI- soft, NT, ND, + BS Extremities- no clubbing, cyanosis, or edema MS- no significant deformity or atrophy Skin- no rash or lesion Psych- euthymic mood, full affect Neuro- strength and sensation are intact  EKG-NSR at 78 bpm, LAD, Septal infarct, inferior infarct, age undetermined, Pr int 190 ms, qrs int 80 ms, qtc 451 ms Epic records reviewed    Assessment and Plan: 1. Afib S/p ablation 11/28/2015 and 09/28/2017 Return of afib present x 2 weeks after ablation Had successful  cardioversion 2/12 Has used flecainide in the past without maintaining SR Continue eliquis, chadsvasc score of at least 4  F/u with Dr. Rayann Heman as scheduled  Geroge Baseman. Geneal Huebert, [facility] [address] [place], [place] [address] [phone]

## 2017-10-29 DIAGNOSIS — N183 Chronic kidney disease, stage 3 (moderate): Secondary | ICD-10-CM | POA: Diagnosis not present

## 2017-10-29 DIAGNOSIS — I129 Hypertensive chronic kidney disease with stage 1 through stage 4 chronic kidney disease, or unspecified chronic kidney disease: Secondary | ICD-10-CM | POA: Diagnosis not present

## 2017-10-29 DIAGNOSIS — I480 Paroxysmal atrial fibrillation: Secondary | ICD-10-CM | POA: Diagnosis not present

## 2017-11-03 ENCOUNTER — Ambulatory Visit (INDEPENDENT_AMBULATORY_CARE_PROVIDER_SITE_OTHER): Payer: Medicare Other | Admitting: *Deleted

## 2017-11-03 DIAGNOSIS — I481 Persistent atrial fibrillation: Secondary | ICD-10-CM

## 2017-11-03 DIAGNOSIS — I4819 Other persistent atrial fibrillation: Secondary | ICD-10-CM

## 2017-11-04 NOTE — Progress Notes (Signed)
Carelink Summary Report / Loop Recorder 

## 2017-11-09 DIAGNOSIS — I129 Hypertensive chronic kidney disease with stage 1 through stage 4 chronic kidney disease, or unspecified chronic kidney disease: Secondary | ICD-10-CM | POA: Diagnosis not present

## 2017-11-09 DIAGNOSIS — N183 Chronic kidney disease, stage 3 (moderate): Secondary | ICD-10-CM | POA: Diagnosis not present

## 2017-11-09 DIAGNOSIS — E039 Hypothyroidism, unspecified: Secondary | ICD-10-CM | POA: Diagnosis not present

## 2017-11-09 DIAGNOSIS — I480 Paroxysmal atrial fibrillation: Secondary | ICD-10-CM | POA: Diagnosis not present

## 2017-11-12 DIAGNOSIS — C44311 Basal cell carcinoma of skin of nose: Secondary | ICD-10-CM | POA: Diagnosis not present

## 2017-11-12 DIAGNOSIS — X32XXXD Exposure to sunlight, subsequent encounter: Secondary | ICD-10-CM | POA: Diagnosis not present

## 2017-11-12 DIAGNOSIS — D485 Neoplasm of uncertain behavior of skin: Secondary | ICD-10-CM | POA: Diagnosis not present

## 2017-11-12 DIAGNOSIS — D2239 Melanocytic nevi of other parts of face: Secondary | ICD-10-CM | POA: Diagnosis not present

## 2017-11-12 DIAGNOSIS — L570 Actinic keratosis: Secondary | ICD-10-CM | POA: Diagnosis not present

## 2017-12-06 ENCOUNTER — Ambulatory Visit (INDEPENDENT_AMBULATORY_CARE_PROVIDER_SITE_OTHER): Payer: Medicare Other | Admitting: *Deleted

## 2017-12-06 DIAGNOSIS — I480 Paroxysmal atrial fibrillation: Secondary | ICD-10-CM | POA: Diagnosis not present

## 2017-12-07 NOTE — Progress Notes (Signed)
Carelink Summary Reprot / Loop Recorder 

## 2017-12-11 LAB — CUP PACEART REMOTE DEVICE CHECK
Date Time Interrogation Session: 20190227231007
Implantable Pulse Generator Implant Date: 20171002

## 2017-12-15 ENCOUNTER — Encounter: Payer: Self-pay | Admitting: Internal Medicine

## 2017-12-20 ENCOUNTER — Encounter: Payer: Self-pay | Admitting: Internal Medicine

## 2017-12-20 ENCOUNTER — Ambulatory Visit (INDEPENDENT_AMBULATORY_CARE_PROVIDER_SITE_OTHER): Payer: Medicare Other | Admitting: Internal Medicine

## 2017-12-20 VITALS — BP 124/60 | HR 79 | Ht 65.0 in | Wt 177.0 lb

## 2017-12-20 DIAGNOSIS — I4819 Other persistent atrial fibrillation: Secondary | ICD-10-CM

## 2017-12-20 DIAGNOSIS — I481 Persistent atrial fibrillation: Secondary | ICD-10-CM

## 2017-12-20 MED ORDER — DILTIAZEM HCL ER COATED BEADS 180 MG PO CP24: 180.0000 mg | ORAL_CAPSULE | Freq: Every day | ORAL | 3 refills | Status: DC

## 2017-12-20 NOTE — Patient Instructions (Addendum)
Medication Instructions:  Your physician has recommended you make the following change in your medication:  1.  Decrease your diltiazem 180 mg to one tablet by mouth daily. 2.  After 6 weeks (Jan 31, 2018) discontinue taking diltiazem every day and just take as needed.  Labwork: None ordered.  Testing/Procedures: None ordered.  Follow-Up: Your physician wants you to follow-up in: 3 months with Dr. Rayann Heman.     Any Other Special Instructions Will Be Listed Below (If Applicable).  If you need a refill on your cardiac medications before your next appointment, please call your pharmacy.

## 2017-12-20 NOTE — Progress Notes (Signed)
PCP: Lajean Manes, MD Primary Cardiologist: Dr Marlou Porch Primary EP: Dr Rayann Heman  Frances [name] is a 84 y.o. female who presents today for routine electrophysiology followup.  Since last being seen in our clinic, the patient reports doing very well.  She is pleased with results of her recent AF ablation.  Some ERAF early but none since cardioversion 10/19/17.  Denies procedure related complications. + fatigue with diltiazem. Today, she denies symptoms of palpitations, chest pain, shortness of breath,  lower extremity edema, dizziness, presyncope, or syncope.  The patient is otherwise without complaint today.   Past Medical History:  Diagnosis Date  . Arthritis    hands, back  . Chronic kidney disease    stage 3 renal disease - no med  . Dysrhythmia    Hx - a-fib 05/2010 - tx with meds, no problem since 05/2010  . Hearing loss    bilateral hearing aids  . Hypertension    controlled with meds  . Hypothyroidism   . Internal hemorrhoid   . Paroxysmal atrial fibrillation (HCC)   . Peripheral vascular disease ([place])    right arm and right shoulder blood clots r/t a fall  . Rectal bleeding   . SVD (spontaneous vaginal delivery) 1957, 1959   x 2  . Thyroid disease    hypothyroidism   Past Surgical History:  Procedure Laterality Date  . ABDOMINAL HYSTERECTOMY     and right ovary  . ANTERIOR AND POSTERIOR REPAIR N/A 01/03/2014   Procedure: ANTERIOR (CYSTOCELE) REPAIR;  Surgeon: Floyce Stakes. Pamala Hurry, MD;  Location: [facility];  Service: Gynecology;  Laterality: N/A;  . APPENDECTOMY    . ATRIAL FIBRILLATION ABLATION N/A 09/28/2017   Procedure: ATRIAL FIBRILLATION ABLATION;  Surgeon: [name], MD;  Location: [facility];  Service: Cardiovascular;  Laterality: N/A;  . BACK SURGERY     x 3 - 2 rods and 8 screws  . CARDIOVERSION N/A 08/24/2017   Procedure: CARDIOVERSION;  Surgeon: Jerline Pain, MD;  Location: [facility] ENDOSCOPY;  Service: Cardiovascular;  Laterality: N/A;  . CARDIOVERSION  N/A 10/19/2017   Procedure: CARDIOVERSION;  Surgeon: Josue Hector, MD;  Location: [facility] ENDOSCOPY;  Service: Cardiovascular;  Laterality: N/A;  . COLONOSCOPY    . CYSTOCELE REPAIR    . DENTAL SURGERY     infected tooth - general  . DILATION AND CURETTAGE OF UTERUS     x several  . ELECTROPHYSIOLOGIC STUDY N/A 11/28/2015   Procedure: Atrial Fibrillation Ablation;  Surgeon: [name], MD;  Location: [facility];  Service: Cardiovascular;  Laterality: N/A;  . EP IMPLANTABLE DEVICE N/A 06/08/2016   Procedure: Loop Recorder Insertion;  Surgeon: [name], MD;  Location: [facility];  Service: Cardiovascular;  Laterality: N/A;  . EXAMINATION UNDER ANESTHESIA N/A 01/15/2014   Procedure: EXAM UNDER ANESTHESIA with evacuation of hematoma and over sewing of vaginal mucosa.;  Surgeon: Floyce Stakes. Pamala Hurry, MD;  Location: [facility];  Service: Gynecology;  Laterality: N/A;  . HAND SURGERY     left  . hemorrhoid injection  04/2011  . JOINT REPLACEMENT     left thumb replacement  . TEE WITHOUT CARDIOVERSION N/A 09/27/2017   Procedure: TRANSESOPHAGEAL ECHOCARDIOGRAM (TEE);  Surgeon: Sueanne Margarita, MD;  Location: [facility] ENDOSCOPY;  Service: Cardiovascular;  Laterality: N/A;  . TONSILLECTOMY      ROS- all systems are reviewed and negatives except as per HPI above  Current Outpatient Medications  Medication Sig Dispense Refill  . acetaminophen (  TYLENOL) 500 MG tablet Take 500-1,000 mg by mouth every 6 (six) hours as needed for moderate pain or headache.    . Biotin 5 MG CAPS Take 5 mg by mouth daily.     Marland Kitchen CALCIUM CITRATE PO Take 1 tablet by mouth daily.    Marland Kitchen diltiazem (CARDIZEM CD) 180 MG 24 hr capsule Take 1 capsule (180 mg total) by mouth 2 (two) times daily.    Marland Kitchen diltiazem (CARDIZEM) 30 MG tablet Take 30 mg by mouth every 4 (four) hours as needed (for fast heart rate > 100 as long as BP  > 100).     Marland Kitchen ELIQUIS 5 MG TABS tablet TAKE 1 TABLET BY MOUTH TWICE A DAY (Patient taking differently:  TAKE 5 MG BY MOUTH TWICE A DAY) 60 tablet 5  . hydrochlorothiazide (MICROZIDE) 12.5 MG capsule Take 12.5 mg by mouth daily.    . Inulin (FIBER CHOICE PREBIOTIC FIBER PO) Take 1 tablet by mouth 2 (two) times daily.    Marland Kitchen levothyroxine (SYNTHROID, LEVOTHROID) 75 MCG tablet Take 75 mcg by mouth at bedtime.     . pantoprazole (PROTONIX) 40 MG tablet Take 1 tablet (40 mg total) by mouth daily. 45 tablet 0  . psyllium (METAMUCIL) 58.6 % powder Take 1 packet by mouth daily.    . ramipril (ALTACE) 5 MG capsule Take 5 mg by mouth every evening.     . sodium chloride (OCEAN) 0.65 % SOLN nasal spray Place 2 sprays into both nostrils as needed for congestion.    . vitamin B-12 (CYANOCOBALAMIN) 1000 MCG tablet Take 1,000 mcg by mouth daily.     No current facility-administered medications for this visit.     Physical Exam: Vitals:   12/20/17 1022  BP: 124/60  Pulse: 79  Weight: 177 lb (80.3 kg)  Height: 5\' 5"  (1.651 m)    GEN- The patient is well appearing, alert and oriented x 3 today.   Head- normocephalic, atraumatic Eyes-  Sclera clear, conjunctiva pink Ears- hearing intact Oropharynx- clear Lungs- Clear to ausculation bilaterally, normal work of breathing Heart- Regular rate and rhythm, no murmurs, rubs or gallops, PMI not laterally displaced GI- soft, NT, ND, + BS Extremities- no clubbing, cyanosis, or edema  EKG tracing ordered today is personally reviewed and shows sinus rhythm 79 bpm, PR 178 msec, QRS 88 msec, Qtc 456 msec  Assessment and Plan:  1. Persistent atrial fibrillation Maintaining sinus rhythm post ablation.  She did have some ERAF early requiring cardioversion 10/19/17 but none since -->AF burden 30% (18 % last visit), though last episode was early February) Continue ILR On eliquis cahds2vasc score is 4-5 Reduce diltiazem CD to 180mg  daily.  In 6 weeks, ok to stop diltiazem  2. HTN Stable No change required today  [facility] Return to see me in 3 months  [name] MD, [facility] 12/20/2017 10:40 AM

## 2017-12-31 DIAGNOSIS — Z08 Encounter for follow-up examination after completed treatment for malignant neoplasm: Secondary | ICD-10-CM | POA: Diagnosis not present

## 2017-12-31 DIAGNOSIS — Z85828 Personal history of other malignant neoplasm of skin: Secondary | ICD-10-CM | POA: Diagnosis not present

## 2017-12-31 DIAGNOSIS — D2239 Melanocytic nevi of other parts of face: Secondary | ICD-10-CM | POA: Diagnosis not present

## 2017-12-31 DIAGNOSIS — L308 Other specified dermatitis: Secondary | ICD-10-CM | POA: Diagnosis not present

## 2017-12-31 DIAGNOSIS — L905 Scar conditions and fibrosis of skin: Secondary | ICD-10-CM | POA: Diagnosis not present

## 2018-01-10 ENCOUNTER — Ambulatory Visit (INDEPENDENT_AMBULATORY_CARE_PROVIDER_SITE_OTHER): Payer: Medicare Other | Admitting: *Deleted

## 2018-01-10 DIAGNOSIS — I4819 Other persistent atrial fibrillation: Secondary | ICD-10-CM

## 2018-01-10 DIAGNOSIS — I481 Persistent atrial fibrillation: Secondary | ICD-10-CM | POA: Diagnosis not present

## 2018-01-10 LAB — CUP PACEART REMOTE DEVICE CHECK
Implantable Pulse Generator Implant Date: 20171002
MDC IDC SESS DTM: 20190401234112

## 2018-01-10 NOTE — Progress Notes (Signed)
Carelink Summary Report / Loop Recorder 

## 2018-01-12 DIAGNOSIS — L308 Other specified dermatitis: Secondary | ICD-10-CM | POA: Diagnosis not present

## 2018-01-25 ENCOUNTER — Other Ambulatory Visit: Payer: Self-pay | Admitting: Geriatric Medicine

## 2018-01-25 DIAGNOSIS — R911 Solitary pulmonary nodule: Secondary | ICD-10-CM

## 2018-02-01 ENCOUNTER — Inpatient Hospital Stay
Admission: RE | Admit: 2018-02-01 | Discharge: 2018-02-01 | Disposition: A | Discharge status: Home / Self Care | Payer: Medicare Other | Source: Ambulatory Visit | Attending: Geriatric Medicine | Admitting: Geriatric Medicine

## 2018-02-01 ENCOUNTER — Ambulatory Visit
Admission: RE | Admit: 2018-02-01 | Discharge: 2018-02-01 | Disposition: A | Discharge status: Home / Self Care | Payer: Medicare Other | Source: Ambulatory Visit | Attending: Geriatric Medicine | Admitting: Geriatric Medicine

## 2018-02-01 DIAGNOSIS — R911 Solitary pulmonary nodule: Secondary | ICD-10-CM | POA: Diagnosis not present

## 2018-02-01 LAB — CUP PACEART REMOTE DEVICE CHECK
Implantable Pulse Generator Implant Date: 20171002
MDC IDC SESS DTM: 20190505000941

## 2018-02-07 ENCOUNTER — Encounter: Payer: Medicare Other | Admitting: Internal Medicine

## 2018-02-10 ENCOUNTER — Ambulatory Visit (INDEPENDENT_AMBULATORY_CARE_PROVIDER_SITE_OTHER): Payer: Medicare Other | Admitting: *Deleted

## 2018-02-10 DIAGNOSIS — I480 Paroxysmal atrial fibrillation: Secondary | ICD-10-CM

## 2018-02-11 NOTE — Progress Notes (Signed)
Carelink Summary Report / Loop Recorder 

## 2018-02-25 DIAGNOSIS — R300 Dysuria: Secondary | ICD-10-CM | POA: Diagnosis not present

## 2018-02-25 DIAGNOSIS — I129 Hypertensive chronic kidney disease with stage 1 through stage 4 chronic kidney disease, or unspecified chronic kidney disease: Secondary | ICD-10-CM | POA: Diagnosis not present

## 2018-02-25 DIAGNOSIS — I480 Paroxysmal atrial fibrillation: Secondary | ICD-10-CM | POA: Diagnosis not present

## 2018-03-02 ENCOUNTER — Other Ambulatory Visit: Payer: Self-pay | Admitting: Obstetrics

## 2018-03-02 DIAGNOSIS — Z1231 Encounter for screening mammogram for malignant neoplasm of breast: Secondary | ICD-10-CM

## 2018-03-07 ENCOUNTER — Encounter: Payer: Self-pay | Admitting: Internal Medicine

## 2018-03-15 ENCOUNTER — Ambulatory Visit (INDEPENDENT_AMBULATORY_CARE_PROVIDER_SITE_OTHER): Payer: Medicare Other | Admitting: *Deleted

## 2018-03-15 DIAGNOSIS — I480 Paroxysmal atrial fibrillation: Secondary | ICD-10-CM | POA: Diagnosis not present

## 2018-03-16 ENCOUNTER — Ambulatory Visit (INDEPENDENT_AMBULATORY_CARE_PROVIDER_SITE_OTHER): Payer: Medicare Other | Admitting: Internal Medicine

## 2018-03-16 ENCOUNTER — Encounter: Payer: Self-pay | Admitting: Internal Medicine

## 2018-03-16 VITALS — BP 124/72 | HR 78 | Ht 65.0 in | Wt 178.0 lb

## 2018-03-16 DIAGNOSIS — I481 Persistent atrial fibrillation: Secondary | ICD-10-CM | POA: Diagnosis not present

## 2018-03-16 DIAGNOSIS — I4819 Other persistent atrial fibrillation: Secondary | ICD-10-CM

## 2018-03-16 NOTE — Patient Instructions (Addendum)
Medication Instructions:  Your physician recommends that you continue on your current medications as directed. Please refer to the Current Medication list given to you today.  Labwork: None ordered.  Testing/Procedures: None ordered.  Follow-Up: Your physician wants you to follow-up in: 3 months with Roderic Palau NP at the Afib clinic.   Any Other Special Instructions Will Be Listed Below (If Applicable).  If you need a refill on your cardiac medications before your next appointment, please call your pharmacy.

## 2018-03-16 NOTE — Progress Notes (Signed)
PCP: Lajean Manes, MD Primary Cardiologist: Dr Marlou Porch Primary EP: Dr Rayann Heman  Frances [name] is a 84 y.o. female who presents today for routine electrophysiology followup.  Since last being seen in our clinic, the patient reports doing very well.  Today, she denies symptoms of palpitations, chest pain, shortness of breath,  lower extremity edema, dizziness, presyncope, or syncope.  The patient is otherwise without complaint today.   Past Medical History:  Diagnosis Date  . Arthritis    hands, back  . Chronic kidney disease    stage 3 renal disease - no med  . Dysrhythmia    Hx - a-fib 05/2010 - tx with meds, no problem since 05/2010  . Hearing loss    bilateral hearing aids  . Hypertension    controlled with meds  . Hypothyroidism   . Internal hemorrhoid   . Paroxysmal atrial fibrillation (HCC)   . Peripheral vascular disease ([place])    right arm and right shoulder blood clots r/t a fall  . Rectal bleeding   . SVD (spontaneous vaginal delivery) 1957, 1959   x 2  . Thyroid disease    hypothyroidism   Past Surgical History:  Procedure Laterality Date  . ABDOMINAL HYSTERECTOMY     and right ovary  . ANTERIOR AND POSTERIOR REPAIR N/A 01/03/2014   Procedure: ANTERIOR (CYSTOCELE) REPAIR;  Surgeon: Floyce Stakes. Pamala Hurry, MD;  Location: [facility];  Service: Gynecology;  Laterality: N/A;  . APPENDECTOMY    . ATRIAL FIBRILLATION ABLATION N/A 09/28/2017   Procedure: ATRIAL FIBRILLATION ABLATION;  Surgeon: [name], MD;  Location: [facility];  Service: Cardiovascular;  Laterality: N/A;  . BACK SURGERY     x 3 - 2 rods and 8 screws  . CARDIOVERSION N/A 08/24/2017   Procedure: CARDIOVERSION;  Surgeon: Jerline Pain, MD;  Location: [facility] ENDOSCOPY;  Service: Cardiovascular;  Laterality: N/A;  . CARDIOVERSION N/A 10/19/2017   Procedure: CARDIOVERSION;  Surgeon: Josue Hector, MD;  Location: [facility] ENDOSCOPY;  Service: Cardiovascular;  Laterality: N/A;  . COLONOSCOPY    . CYSTOCELE  REPAIR    . DENTAL SURGERY     infected tooth - general  . DILATION AND CURETTAGE OF UTERUS     x several  . ELECTROPHYSIOLOGIC STUDY N/A 11/28/2015   Procedure: Atrial Fibrillation Ablation;  Surgeon: [name], MD;  Location: [place] [facility];  Service: Cardiovascular;  Laterality: N/A;  . EP IMPLANTABLE DEVICE N/A 06/08/2016   Procedure: Loop Recorder Insertion;  Surgeon: [name], MD;  Location: [place];  Service: Cardiovascular;  Laterality: N/A;  . EXAMINATION UNDER ANESTHESIA N/A 01/15/2014   Procedure: EXAM UNDER ANESTHESIA with evacuation of hematoma and over sewing of vaginal mucosa.;  Surgeon: Floyce Stakes. Pamala Hurry, MD;  Location: [facility];  Service: Gynecology;  Laterality: N/A;  . HAND SURGERY     left  . hemorrhoid injection  04/2011  . JOINT REPLACEMENT     left thumb replacement  . TEE WITHOUT CARDIOVERSION N/A 09/27/2017   Procedure: TRANSESOPHAGEAL ECHOCARDIOGRAM (TEE);  Surgeon: Sueanne Margarita, MD;  Location: [facility] ENDOSCOPY;  Service: Cardiovascular;  Laterality: N/A;  . TONSILLECTOMY      ROS- all systems are reviewed and negatives except as per HPI above  Current Outpatient Medications  Medication Sig Dispense Refill  . acetaminophen (TYLENOL) 500 MG tablet Take 500-1,000 mg by mouth every 6 (six) hours as needed for moderate pain or headache.    . Biotin 5 MG CAPS  Take 5 mg by mouth daily.     Marland Kitchen CALCIUM CITRATE PO Take 1 tablet by mouth daily.    Marland Kitchen diltiazem (CARDIZEM CD) 180 MG 24 hr capsule Take 1 capsule (180 mg total) by mouth daily. 90 capsule 3  . diltiazem (CARDIZEM) 30 MG tablet Take 30 mg by mouth every 4 (four) hours as needed (for fast heart rate > 100 as long as BP  > 100).     Marland Kitchen ELIQUIS 5 MG TABS tablet TAKE 1 TABLET BY MOUTH TWICE A DAY (Patient taking differently: TAKE 5 MG BY MOUTH TWICE A DAY) 60 tablet 5  . hydrochlorothiazide (MICROZIDE) 12.5 MG capsule Take 12.5 mg by mouth daily.    . Inulin (FIBER CHOICE PREBIOTIC FIBER PO) Take 1  tablet by mouth 2 (two) times daily.    Marland Kitchen levothyroxine (SYNTHROID, LEVOTHROID) 75 MCG tablet Take 75 mcg by mouth at bedtime.     . psyllium (METAMUCIL) 58.6 % powder Take 1 packet by mouth daily.    . ramipril (ALTACE) 5 MG capsule Take 5 mg by mouth every evening.     . sodium chloride (OCEAN) 0.65 % SOLN nasal spray Place 2 sprays into both nostrils as needed for congestion.    . vitamin B-12 (CYANOCOBALAMIN) 1000 MCG tablet Take 1,000 mcg by mouth daily.     No current facility-administered medications for this visit.     Physical Exam: Vitals:   03/16/18 1444  BP: 124/72  Pulse: 78  Weight: 178 lb (80.7 kg)  Height: 5\' 5"  (1.651 m)    GEN- The patient is well appearing, alert and oriented x 3 today.   Head- normocephalic, atraumatic Eyes-  Sclera clear, conjunctiva pink Ears- hearing intact Oropharynx- clear Lungs- Clear to ausculation bilaterally, normal work of breathing Heart- Regular rate and rhythm, no murmurs, rubs or gallops, PMI not laterally displaced GI- soft, NT, ND, + BS Extremities- no clubbing, cyanosis, or edema  Wt Readings from Last 3 Encounters:  03/16/18 178 lb (80.7 kg)  12/20/17 177 lb (80.3 kg)  10/28/17 178 lb (80.7 kg)    EKG tracing ordered today is personally reviewed and shows sinus rhythm, PR 210 msec, poor r wave progression  Assessment and Plan:  1. Persistent atrial fibrillation Doing well s/p ablation ILR reveals afib burden of 0.2 % (30% last visit) On eliquis for chads2vasc score of at least 4  2. HTN Stable No change required today  Return to see Butch Penny in AF clinic in 3 months carelink  [name] MD, [facility] 03/16/2018 3:10 PM

## 2018-03-16 NOTE — Progress Notes (Signed)
Carelink Summary Report / Loop Recorder 

## 2018-03-21 LAB — CUP PACEART INCLINIC DEVICE CHECK
Date Time Interrogation Session: 20190710190434
Implantable Pulse Generator Implant Date: 20171002

## 2018-03-21 LAB — CUP PACEART REMOTE DEVICE CHECK
Date Time Interrogation Session: 20190607003940
MDC IDC PG IMPLANT DT: 20171002

## 2018-03-28 ENCOUNTER — Ambulatory Visit
Admission: RE | Admit: 2018-03-28 | Discharge: 2018-03-28 | Disposition: A | Discharge status: Home / Self Care | Payer: Medicare Other | Source: Ambulatory Visit | Attending: Obstetrics | Admitting: Obstetrics

## 2018-03-28 DIAGNOSIS — Z1231 Encounter for screening mammogram for malignant neoplasm of breast: Secondary | ICD-10-CM | POA: Diagnosis not present

## 2018-04-18 ENCOUNTER — Ambulatory Visit (INDEPENDENT_AMBULATORY_CARE_PROVIDER_SITE_OTHER): Payer: Medicare Other | Admitting: *Deleted

## 2018-04-18 DIAGNOSIS — I480 Paroxysmal atrial fibrillation: Secondary | ICD-10-CM | POA: Diagnosis not present

## 2018-04-18 NOTE — Progress Notes (Signed)
Carelink Summary Report / Loop Recorder 

## 2018-04-21 LAB — CUP PACEART REMOTE DEVICE CHECK
Implantable Pulse Generator Implant Date: 20171002
MDC IDC SESS DTM: 20190710013835

## 2018-04-27 ENCOUNTER — Other Ambulatory Visit: Payer: Self-pay | Admitting: Internal Medicine

## 2018-04-27 NOTE — Telephone Encounter (Signed)
Eliquis 5mg  refill request received; pt is 84 yrs old, Wt-80.7kg, Crea-0.93 on 10/15/17, last seen by Dr. Rayann Heman on 03/16/18; will send in refill to requested pharmacy.

## 2018-04-30 DIAGNOSIS — Z23 Encounter for immunization: Secondary | ICD-10-CM | POA: Diagnosis not present

## 2018-05-03 ENCOUNTER — Encounter: Payer: Self-pay | Admitting: Cardiology

## 2018-05-03 ENCOUNTER — Ambulatory Visit (INDEPENDENT_AMBULATORY_CARE_PROVIDER_SITE_OTHER): Payer: Medicare Other | Admitting: Cardiology

## 2018-05-03 VITALS — BP 144/78 | HR 76 | Ht 65.0 in | Wt 175.4 lb

## 2018-05-03 DIAGNOSIS — Z7901 Long term (current) use of anticoagulants: Secondary | ICD-10-CM | POA: Diagnosis not present

## 2018-05-03 DIAGNOSIS — Z95818 Presence of other cardiac implants and grafts: Secondary | ICD-10-CM | POA: Diagnosis not present

## 2018-05-03 DIAGNOSIS — I481 Persistent atrial fibrillation: Secondary | ICD-10-CM | POA: Diagnosis not present

## 2018-05-03 DIAGNOSIS — I4819 Other persistent atrial fibrillation: Secondary | ICD-10-CM

## 2018-05-03 NOTE — Patient Instructions (Signed)

## 2018-05-03 NOTE — Progress Notes (Signed)
Cardiology Office Note    Date:  05/03/2018   ID:  Frances [name], DOB 1933/09/17, MRN [id number]  PCP:  Frances Manes, MD  Cardiologist:   [name], MD     History of Present Illness:  Frances [name] is a 84 y.o. female here for follow-up of paroxysmal atrial fibrillation with ablation therapy on 11/28/15 with hypothyroidism, CKD, hypertension, PAD. Dr. Rayann Heman. She was seen Previously by Roderic Palau and endorsed chest discomfort, pressure shortness of breath. Myoview was performed and was interpreted as intermediate risk.  Right after her ablation she did notice some symptoms of chest pressure and shortness of breath and she would have to stop and rest with going up stairs.  Still feeling hot flashes after several years has a little fan that she carries around with her. Sometimes this accompanies palpitations.  She will occasionally feel palpitations, and has had an implantable loop recorder in place to record potential atrial fibrillation episodes. She had a three-hour episode in December. She takes additional diltiazem if necessary.  She recounted her story of going to sleep evaluation on 10/28/2015 and feeling atrial fibrillation previous to this. She had a syncopal episode in the middle the night and was rushed to the emergency room.  05/03/17-has implantable loop recorder in place. She also reports her heart rate from [employer] watch. She showed me a printout from [place] watch. She occasionally will feel an hour to episode of fast heart rates. Thankfully, loop recorder did not show any evidence of atrial fibrillation during this time. This may have been atrial tachycardia. Sinus tachycardia noted as well. Her husband is always concerned about her heart. She has to lay down and take an extra diltiazem if necessary.  05/03/18 -she had an eventful year, in February had cardioversion, took several attempts.  She has been seen in the atrial fibrillation clinic and by Dr. Rayann Heman.   Her last implantable loop recorder A. fib burden is excellent at 0.2%, was 30% at a prior visit.  No fevers chills nausea vomiting syncope bleeding.    Past Medical History:  Diagnosis Date  . Arthritis    hands, back  . Asthmatic bronchitis   . Atrial fibrillation ([place]) 07/31/2013  . Chronic anticoagulation 07/30/2014  . Chronic kidney disease    stage 3 renal disease - no med  . Dizziness 07/30/2014  . Dysrhythmia    Hx - a-fib 05/2010 - tx with meds, no problem since 05/2010  . First degree AV block 07/30/2014  . Hearing loss    bilateral hearing aids  . Hypertension    controlled with meds  . Hypothyroidism   . Internal hemorrhoid   . PAF (paroxysmal atrial fibrillation) ([place]) 11/28/2015  . Paroxysmal atrial fibrillation (HCC)   . Peripheral vascular disease ([place])    right arm and right shoulder blood clots r/t a fall  . Persistent atrial fibrillation ([place]) 09/28/2017  . Rectal bleeding   . SVD (spontaneous vaginal delivery) 1957, 1959   x 2  . Thyroid disease    hypothyroidism    Past Surgical History:  Procedure Laterality Date  . ABDOMINAL HYSTERECTOMY     and right ovary  . ANTERIOR AND POSTERIOR REPAIR N/A 01/03/2014   Procedure: ANTERIOR (CYSTOCELE) REPAIR;  Surgeon: Floyce Stakes. Pamala Hurry, MD;  Location: [facility];  Service: Gynecology;  Laterality: N/A;  . APPENDECTOMY    . ATRIAL FIBRILLATION ABLATION N/A 09/28/2017   Procedure: ATRIAL FIBRILLATION ABLATION;  Surgeon: [name], MD;  Location: [facility]  INVASIVE [facility] LAB;  Service: Cardiovascular;  Laterality: N/A;  . BACK SURGERY     x 3 - 2 rods and 8 screws  . CARDIOVERSION N/A 08/24/2017   Procedure: CARDIOVERSION;  Surgeon: Jerline Pain, MD;  Location: [facility] ENDOSCOPY;  Service: Cardiovascular;  Laterality: N/A;  . CARDIOVERSION N/A 10/19/2017   Procedure: CARDIOVERSION;  Surgeon: Josue Hector, MD;  Location: [facility] ENDOSCOPY;  Service: Cardiovascular;  Laterality: N/A;  . COLONOSCOPY    . CYSTOCELE REPAIR    . DENTAL  SURGERY     infected tooth - general  . DILATION AND CURETTAGE OF UTERUS     x several  . ELECTROPHYSIOLOGIC STUDY N/A 11/28/2015   Procedure: Atrial Fibrillation Ablation;  Surgeon: [name], MD;  Location: [place] [facility];  Service: Cardiovascular;  Laterality: N/A;  . EP IMPLANTABLE DEVICE N/A 06/08/2016   Procedure: Loop Recorder Insertion;  Surgeon: [name], MD;  Location: [place] [facility];  Service: Cardiovascular;  Laterality: N/A;  . EXAMINATION UNDER ANESTHESIA N/A 01/15/2014   Procedure: EXAM UNDER ANESTHESIA with evacuation of hematoma and over sewing of vaginal mucosa.;  Surgeon: Floyce Stakes. Pamala Hurry, MD;  Location: [facility];  Service: Gynecology;  Laterality: N/A;  . HAND SURGERY     left  . hemorrhoid injection  04/2011  . JOINT REPLACEMENT     left thumb replacement  . TEE WITHOUT CARDIOVERSION N/A 09/27/2017   Procedure: TRANSESOPHAGEAL ECHOCARDIOGRAM (TEE);  Surgeon: Sueanne Margarita, MD;  Location: [facility] ENDOSCOPY;  Service: Cardiovascular;  Laterality: N/A;  . TONSILLECTOMY      Current Medications: Outpatient Medications Prior to Visit  Medication Sig Dispense Refill  . acetaminophen (TYLENOL) 500 MG tablet Take 500-1,000 mg by mouth every 6 (six) hours as needed for moderate pain or headache.    . Biotin 5 MG CAPS Take 5 mg by mouth daily.     Marland Kitchen CALCIUM CITRATE PO Take 1 tablet by mouth daily.    Marland Kitchen diltiazem (CARDIZEM) 30 MG tablet Take 30 mg by mouth every 4 (four) hours as needed (for fast heart rate > 100 as long as BP  > 100).     Marland Kitchen ELIQUIS 5 MG TABS tablet TAKE 1 TABLET BY MOUTH TWICE A DAY 180 tablet 2  . hydrochlorothiazide (MICROZIDE) 12.5 MG capsule Take 12.5 mg by mouth daily.    . Inulin (FIBER CHOICE PREBIOTIC FIBER PO) Take 1 tablet by mouth 2 (two) times daily.    Marland Kitchen levothyroxine (SYNTHROID, LEVOTHROID) 75 MCG tablet Take 75 mcg by mouth at bedtime.     . psyllium (METAMUCIL) 58.6 % powder Take 1 packet by mouth daily.    . ramipril (ALTACE) 5 MG  capsule Take 5 mg by mouth every evening.     . sodium chloride (OCEAN) 0.65 % SOLN nasal spray Place 2 sprays into both nostrils as needed for congestion.    . vitamin B-12 (CYANOCOBALAMIN) 1000 MCG tablet Take 1,000 mcg by mouth daily.    Marland Kitchen diltiazem (CARDIZEM CD) 180 MG 24 hr capsule Take 1 capsule (180 mg total) by mouth daily. 90 capsule 3   No facility-administered medications prior to visit.      Allergies:   Pneumovax 23 [pneumococcal vac polyvalent]; Codeine; Cephalexin; Sudafed [pseudoephedrine hcl]; and Sulfa antibiotics   Social History   Socioeconomic History  . Marital status: Married    Spouse name: Not on file  . Number of children: Not on file  . Years of education: Not on  file  . Highest education level: Not on file  Occupational History  . Not on file  Social Needs  . Financial resource strain: Not on file  . Food insecurity:    Worry: Not on file    Inability: Not on file  . Transportation needs:    Medical: Not on file    Non-medical: Not on file  Tobacco Use  . Smoking status: Never Smoker  . Smokeless tobacco: Never Used  Substance and Sexual Activity  . Alcohol use: No  . Drug use: No  . Sexual activity: Not Currently    Birth control/protection: Post-menopausal  Lifestyle  . Physical activity:    Days per week: Not on file    Minutes per session: Not on file  . Stress: Not on file  Relationships  . Social connections:    Talks on phone: Not on file    Gets together: Not on file    Attends religious service: Not on file    Active member of club or organization: Not on file    Attends meetings of clubs or organizations: Not on file    Relationship status: Not on file  Other Topics Concern  . Not on file  Social History Narrative  . Not on file     Family History:  The patient's family history includes Diabetes in her father; Heart attack in her mother and sister; Heart disease in her brother, mother, and sister; Heart failure in her  brother; Hypertension in her father and sister; Stroke in her father; Sudden death in her maternal grandfather.   ROS:   Please see the history of present illness.    Review of Systems  All other systems reviewed and are negative.   PHYSICAL EXAM:   VS:  BP (!) 144/78   Pulse 76   Ht 5\' 5"  (1.651 m)   Wt 175 lb 6.4 oz (79.6 kg)   LMP  (LMP Unknown)   SpO2 96%   BMI 29.19 kg/m    GEN: Well nourished, well developed, in no acute distress  HEENT: normal  Neck: no JVD, carotid bruits, or masses Cardiac: RRR; no murmurs, rubs, or gallops,no edema  Respiratory:  clear to auscultation bilaterally, normal work of breathing GI: soft, nontender, nondistended, + BS MS: no deformity or atrophy  Skin: warm and dry, no rash Neuro:  Alert and Oriented x 3, Strength and sensation are intact Psych: euthymic mood, full affect   Wt Readings from Last 3 Encounters:  05/03/18 175 lb 6.4 oz (79.6 kg)  03/16/18 178 lb (80.7 kg)  12/20/17 177 lb (80.3 kg)      Studies/Labs Reviewed:   EKG:  EKG is not ordered today.    Recent Labs: 10/15/2017: BUN 16; Creatinine, Ser 0.93; Hemoglobin 14.2; Platelets 244; Potassium 4.2; Sodium 139   Lipid Panel No results found for: CHOL, TRIG, HDL, CHOLHDL, VLDL, LDLCALC, LDLDIRECT  Additional studies/ records that were reviewed today include:  Prior office visit, lab work, stress test reviewed  Myoview was previously determined to be intermediate risk however she is not currently having any symptoms, could have been artifactual.   ASSESSMENT:    1. Persistent atrial fibrillation ([place])   2. Chronic anticoagulation   3. Status post placement of implantable loop recorder      PLAN:  In order of problems listed above:  Paroxysmal atrial fibrillation  - Post ablation x 2. 11/28/15 and 09/28/17 - Dr. Rayann Heman  -Now doing well.  0.2% burden.  Dyspnea  -Continue to encourage exercise.  Essential hypertension  - Overall has been under good control.   Stable.  Anticoagulation  -Currently in donut hole, Eliquis.  Expensive.  Continue with this medication.  Since she is also being seen in the atrial fibrillation clinic, we will see her back in 1 year.  Medication Adjustments/Labs and Tests Ordered: Current medicines are reviewed at length with the patient today.  Concerns regarding medicines are outlined above.  Medication changes, Labs and Tests ordered today are listed in the Patient Instructions below. Patient Instructions  Medication Instructions:  The current medical regimen is effective;  continue present plan and medications.  Follow-Up: Follow up in 1 year with Dr. Marlou Porch.  You will receive a letter in the mail 2 months before you are due.  Please call us when you receive this letter to schedule your follow up appointment.  If you need a refill on your cardiac medications before your next appointment, please call your pharmacy.  Thank you for choosing [facility]!!        Signed, [name], MD  05/03/2018 11:54 AM    [facility] [address], [place], [place]  [address] Phone: [phone]; Fax: [phone]

## 2018-05-04 DIAGNOSIS — Z124 Encounter for screening for malignant neoplasm of cervix: Secondary | ICD-10-CM | POA: Diagnosis not present

## 2018-05-20 ENCOUNTER — Ambulatory Visit (INDEPENDENT_AMBULATORY_CARE_PROVIDER_SITE_OTHER): Payer: Medicare Other | Admitting: *Deleted

## 2018-05-20 DIAGNOSIS — I481 Persistent atrial fibrillation: Secondary | ICD-10-CM

## 2018-05-20 DIAGNOSIS — I4819 Other persistent atrial fibrillation: Secondary | ICD-10-CM

## 2018-05-21 NOTE — Progress Notes (Signed)
Carelink Summary Report / Loop Recorder 

## 2018-05-25 LAB — CUP PACEART REMOTE DEVICE CHECK
Date Time Interrogation Session: 20190812013733
MDC IDC PG IMPLANT DT: 20171002

## 2018-06-01 DIAGNOSIS — H35033 Hypertensive retinopathy, bilateral: Secondary | ICD-10-CM | POA: Diagnosis not present

## 2018-06-01 DIAGNOSIS — H5201 Hypermetropia, right eye: Secondary | ICD-10-CM | POA: Diagnosis not present

## 2018-06-01 DIAGNOSIS — E119 Type 2 diabetes mellitus without complications: Secondary | ICD-10-CM | POA: Diagnosis not present

## 2018-06-01 DIAGNOSIS — Z961 Presence of intraocular lens: Secondary | ICD-10-CM | POA: Diagnosis not present

## 2018-06-01 DIAGNOSIS — H52223 Regular astigmatism, bilateral: Secondary | ICD-10-CM | POA: Diagnosis not present

## 2018-06-01 DIAGNOSIS — H26493 Other secondary cataract, bilateral: Secondary | ICD-10-CM | POA: Diagnosis not present

## 2018-06-01 DIAGNOSIS — I10 Essential (primary) hypertension: Secondary | ICD-10-CM | POA: Diagnosis not present

## 2018-06-04 LAB — CUP PACEART REMOTE DEVICE CHECK
Date Time Interrogation Session: 20190914020604
MDC IDC PG IMPLANT DT: 20171002

## 2018-06-16 ENCOUNTER — Encounter (HOSPITAL_COMMUNITY): Payer: Self-pay | Admitting: Nurse Practitioner

## 2018-06-16 ENCOUNTER — Ambulatory Visit (HOSPITAL_COMMUNITY)
Admission: RE | Admit: 2018-06-16 | Discharge: 2018-06-16 | Disposition: A | Discharge status: Home / Self Care | Payer: Medicare Other | Source: Ambulatory Visit | Attending: Nurse Practitioner | Admitting: Nurse Practitioner

## 2018-06-16 VITALS — BP 118/62 | HR 74 | Ht 65.0 in | Wt 178.0 lb

## 2018-06-16 DIAGNOSIS — Z8249 Family history of ischemic heart disease and other diseases of the circulatory system: Secondary | ICD-10-CM | POA: Insufficient documentation

## 2018-06-16 DIAGNOSIS — Z79891 Long term (current) use of opiate analgesic: Secondary | ICD-10-CM | POA: Diagnosis not present

## 2018-06-16 DIAGNOSIS — Z881 Allergy status to other antibiotic agents status: Secondary | ICD-10-CM | POA: Diagnosis not present

## 2018-06-16 DIAGNOSIS — Z882 Allergy status to sulfonamides status: Secondary | ICD-10-CM | POA: Diagnosis not present

## 2018-06-16 DIAGNOSIS — Z885 Allergy status to narcotic agent status: Secondary | ICD-10-CM | POA: Insufficient documentation

## 2018-06-16 DIAGNOSIS — Z7901 Long term (current) use of anticoagulants: Secondary | ICD-10-CM | POA: Insufficient documentation

## 2018-06-16 DIAGNOSIS — Z833 Family history of diabetes mellitus: Secondary | ICD-10-CM | POA: Diagnosis not present

## 2018-06-16 DIAGNOSIS — I4891 Unspecified atrial fibrillation: Secondary | ICD-10-CM | POA: Insufficient documentation

## 2018-06-16 DIAGNOSIS — I480 Paroxysmal atrial fibrillation: Secondary | ICD-10-CM

## 2018-06-16 DIAGNOSIS — Z79899 Other long term (current) drug therapy: Secondary | ICD-10-CM | POA: Diagnosis not present

## 2018-06-16 DIAGNOSIS — Z823 Family history of stroke: Secondary | ICD-10-CM | POA: Diagnosis not present

## 2018-06-16 NOTE — Progress Notes (Signed)
Primary Care Physician: Lajean Manes, MD Referring Physician: Dr. Marva Panda is a 84 y.o. female with a h/o afib,s/p ablation 11/2015. She had been on flecainide prior to ablation and was failing drug.She had been doing very well until she developed afib  07/2017 and it had been persistent. She has been taking an extra 30 mg Cardizem every 4 hours since then but remained in afib today with v rates controlled. No obvious trigger. Discussed pursuing cardioversion but  needed 4 weekly INR's. Pt would rather do this, than to have a TEE guided cardioversion.  Pt had cardioversion 12/18 and enjoyed SR x 2-3 days, but then has been in afib since then. She is back into clinic to discuss options.Continues on warfarin but is interested in working with coumadin clinic at PCP's office to transfer onto eliquis. She was referred back to Dr. Rayann Heman ot discuss second ablation.  F/u in afib clinic s/p second ablation for afib, 09/28/2017. She asked to be seen as she felt that she has been in persistent afib for 2 weeks. She did enjoy 2-3 days of SR after surgery. No missed doses of eliquis for at least 3 weeks. States that she tolerated the ablation procedure well.  F/u one month ablation, 2/21 and also cardioversion that was scheduled on last visit. Cardioversion was successful and she remains in SR today. She feels improved.  F/u in afib clinic 10/10. She is in SR. She has only had several hours of afib several weeks ago. Otherwise, she is is staying in [place]. She did use her prn 30 mg Cardizem pill to manage.   Today, she denies symptoms of palpitations, chest pain, shortness of breath, orthopnea, PND, lower extremity edema, dizziness, presyncope, syncope, or neurologic sequela. The patient is tolerating medications without difficulties and is otherwise without complaint today.   Past Medical History:  Diagnosis Date  . Arthritis    hands, back  . Asthmatic bronchitis   . Atrial  fibrillation ([place]) 07/31/2013  . Chronic anticoagulation 07/30/2014  . Chronic kidney disease    stage 3 renal disease - no med  . Dizziness 07/30/2014  . Dysrhythmia    Hx - a-fib 05/2010 - tx with meds, no problem since 05/2010  . First degree AV block 07/30/2014  . Hearing loss    bilateral hearing aids  . Hypertension    controlled with meds  . Hypothyroidism   . Internal hemorrhoid   . PAF (paroxysmal atrial fibrillation) ([place]) 11/28/2015  . Paroxysmal atrial fibrillation ([facility])   . Peripheral vascular disease ([place])    right arm and right shoulder blood clots r/t a fall  . Persistent atrial fibrillation 09/28/2017  . Rectal bleeding   . SVD (spontaneous vaginal delivery) 1957, 1959   x 2  . Thyroid disease    hypothyroidism   Past Surgical History:  Procedure Laterality Date  . ABDOMINAL HYSTERECTOMY     and right ovary  . ANTERIOR AND POSTERIOR REPAIR N/A 01/03/2014   Procedure: ANTERIOR (CYSTOCELE) REPAIR;  Surgeon: Floyce Stakes. Pamala Hurry, MD;  Location: [facility];  Service: Gynecology;  Laterality: N/A;  . APPENDECTOMY    . ATRIAL FIBRILLATION ABLATION N/A 09/28/2017   Procedure: ATRIAL FIBRILLATION ABLATION;  Surgeon: [name], MD;  Location: [facility];  Service: Cardiovascular;  Laterality: N/A;  . BACK SURGERY     x 3 - 2 rods and 8 screws  . CARDIOVERSION N/A 08/24/2017   Procedure: CARDIOVERSION;  Surgeon: [name]  C, MD;  Location: [facility];  Service: Cardiovascular;  Laterality: N/A;  . CARDIOVERSION N/A 10/19/2017   Procedure: CARDIOVERSION;  Surgeon: Josue Hector, MD;  Location: [facility];  Service: Cardiovascular;  Laterality: N/A;  . COLONOSCOPY    . CYSTOCELE REPAIR    . DENTAL SURGERY     infected tooth - general  . DILATION AND CURETTAGE OF UTERUS     x several  . ELECTROPHYSIOLOGIC STUDY N/A 11/28/2015   Procedure: Atrial Fibrillation Ablation;  Surgeon: [name], MD;  Location: [facility];  Service: Cardiovascular;   Laterality: N/A;  . EP IMPLANTABLE DEVICE N/A 06/08/2016   Procedure: Loop Recorder Insertion;  Surgeon: [name], MD;  Location: [facility];  Service: Cardiovascular;  Laterality: N/A;  . EXAMINATION UNDER ANESTHESIA N/A 01/15/2014   Procedure: EXAM UNDER ANESTHESIA with evacuation of hematoma and over sewing of vaginal mucosa.;  Surgeon: Floyce Stakes. Pamala Hurry, MD;  Location: [facility];  Service: Gynecology;  Laterality: N/A;  . HAND SURGERY     left  . hemorrhoid injection  04/2011  . JOINT REPLACEMENT     left thumb replacement  . TEE WITHOUT CARDIOVERSION N/A 09/27/2017   Procedure: TRANSESOPHAGEAL ECHOCARDIOGRAM (TEE);  Surgeon: Sueanne Margarita, MD;  Location: [facility];  Service: Cardiovascular;  Laterality: N/A;  . TONSILLECTOMY      Current Outpatient Medications  Medication Sig Dispense Refill  . acetaminophen (TYLENOL) 500 MG tablet Take 500-1,000 mg by mouth every 6 (six) hours as needed for moderate pain or headache.    . Biotin 5 MG CAPS Take 5 mg by mouth daily.     Marland Kitchen CALCIUM CITRATE PO Take 1 tablet by mouth daily.    Marland Kitchen diltiazem (CARDIZEM CD) 180 MG 24 hr capsule Take 1 capsule (180 mg total) by mouth daily. 90 capsule 3  . diltiazem (CARDIZEM) 30 MG tablet Take 30 mg by mouth every 4 (four) hours as needed (for fast heart rate > 100 as long as BP  > 100).     Marland Kitchen ELIQUIS 5 MG TABS tablet TAKE 1 TABLET BY MOUTH TWICE A DAY 180 tablet 2  . hydrochlorothiazide (MICROZIDE) 12.5 MG capsule Take 12.5 mg by mouth daily.    . Inulin (FIBER CHOICE PREBIOTIC FIBER PO) Take 1 tablet by mouth 2 (two) times daily.    Marland Kitchen levothyroxine (SYNTHROID, LEVOTHROID) 75 MCG tablet Take 75 mcg by mouth at bedtime.     . psyllium (METAMUCIL) 58.6 % powder Take 1 packet by mouth daily.    . ramipril (ALTACE) 5 MG capsule Take 5 mg by mouth every evening.     . sodium chloride (OCEAN) 0.65 % SOLN nasal spray Place 2 sprays into both nostrils as needed for congestion.    . vitamin B-12  (CYANOCOBALAMIN) 1000 MCG tablet Take 1,000 mcg by mouth daily.     No current facility-administered medications for this encounter.     Allergies  Allergen Reactions  . Pneumovax 23 [Pneumococcal Vac Polyvalent] Other (See Comments)    Arm swelling & fever  . Codeine Nausea And Vomiting  . Cephalexin Anxiety  . Sudafed [Pseudoephedrine Hcl] Anxiety  . Sulfa Antibiotics Rash    Social History   Socioeconomic History  . Marital status: Married    Spouse name: Not on file  . Number of children: Not on file  . Years of education: Not on file  . Highest education level: Not on file  Occupational History  . Not  on file  Social Needs  . Financial resource strain: Not on file  . Food insecurity:    Worry: Not on file    Inability: Not on file  . Transportation needs:    Medical: Not on file    Non-medical: Not on file  Tobacco Use  . Smoking status: Never Smoker  . Smokeless tobacco: Never Used  Substance and Sexual Activity  . Alcohol use: No  . Drug use: No  . Sexual activity: Not Currently    Birth control/protection: Post-menopausal  Lifestyle  . Physical activity:    Days per week: Not on file    Minutes per session: Not on file  . Stress: Not on file  Relationships  . Social connections:    Talks on phone: Not on file    Gets together: Not on file    Attends religious service: Not on file    Active member of club or organization: Not on file    Attends meetings of clubs or organizations: Not on file    Relationship status: Not on file  . Intimate partner violence:    Fear of current or ex partner: Not on file    Emotionally abused: Not on file    Physically abused: Not on file    Forced sexual activity: Not on file  Other Topics Concern  . Not on file  Social History Narrative  . Not on file    Family History  Problem Relation Age of Onset  . Heart disease Mother   . Heart attack Mother   . Hypertension Father   . Diabetes Father   . Stroke Father    . Heart disease Sister   . Hypertension Sister   . Heart disease Brother   . Sudden death Maternal Grandfather   . Heart attack Sister   . Heart failure Brother     ROS- All systems are reviewed and negative except as per the HPI above  Physical Exam: Vitals:   06/16/18 1330  BP: 118/62  Pulse: 74  Weight: 80.7 kg  Height: 5\' 5"  (1.651 m)   Wt Readings from Last 3 Encounters:  06/16/18 80.7 kg  05/03/18 79.6 kg  03/16/18 80.7 kg    Labs: Lab Results  Component Value Date   NA 139 10/15/2017   K 4.2 10/15/2017   CL 105 10/15/2017   CO2 22 10/15/2017   GLUCOSE 109 (H) 10/15/2017   BUN 16 10/15/2017   CREATININE 0.93 10/15/2017   CALCIUM 9.4 10/15/2017   Lab Results  Component Value Date   INR 2.05 08/24/2017   No results found for: CHOL, HDL, LDLCALC, TRIG   GEN- The patient is well appearing, alert and oriented x 3 today.   Head- normocephalic, atraumatic Eyes-  Sclera clear, conjunctiva pink Ears- hearing intact Oropharynx- clear Neck- supple, no JVP Lymph- no cervical lymphadenopathy Lungs- Clear to ausculation bilaterally, normal work of breathing Heart- regular rate and rhythm, no murmurs, rubs or gallops, PMI not laterally displaced GI- soft, NT, ND, + BS Extremities- no clubbing, cyanosis, or edema MS- no significant deformity or atrophy Skin- no rash or lesion Psych- euthymic mood, full affect Neuro- strength and sensation are intact  EKG-NSR at 74 bpm, LAD  Epic records reviewed    Assessment and Plan: 1. Afib S/p ablation 11/28/2015 and 09/28/2017 Afib burden very low Had successful  cardioversion 10/19/17 Continue eliquis, chadsvasc score of at least 4  F/u with Dr. Marlowe Sax as scheduled afib clinic as needed  Geroge Baseman [place], [facility] [address] [place], [place] [address] [phone]

## 2018-06-22 ENCOUNTER — Ambulatory Visit (INDEPENDENT_AMBULATORY_CARE_PROVIDER_SITE_OTHER): Payer: Medicare Other | Admitting: *Deleted

## 2018-06-22 DIAGNOSIS — I480 Paroxysmal atrial fibrillation: Secondary | ICD-10-CM | POA: Diagnosis not present

## 2018-06-23 NOTE — Progress Notes (Signed)
Carelink Summary Report / Loop Recorder 

## 2018-07-04 DIAGNOSIS — R232 Flushing: Secondary | ICD-10-CM | POA: Diagnosis not present

## 2018-07-04 DIAGNOSIS — Z1389 Encounter for screening for other disorder: Secondary | ICD-10-CM | POA: Diagnosis not present

## 2018-07-04 DIAGNOSIS — I129 Hypertensive chronic kidney disease with stage 1 through stage 4 chronic kidney disease, or unspecified chronic kidney disease: Secondary | ICD-10-CM | POA: Diagnosis not present

## 2018-07-04 DIAGNOSIS — E039 Hypothyroidism, unspecified: Secondary | ICD-10-CM | POA: Diagnosis not present

## 2018-07-04 DIAGNOSIS — Z0000 Encounter for general adult medical examination without abnormal findings: Secondary | ICD-10-CM | POA: Diagnosis not present

## 2018-07-04 DIAGNOSIS — Z79899 Other long term (current) drug therapy: Secondary | ICD-10-CM | POA: Diagnosis not present

## 2018-07-04 DIAGNOSIS — K219 Gastro-esophageal reflux disease without esophagitis: Secondary | ICD-10-CM | POA: Diagnosis not present

## 2018-07-04 DIAGNOSIS — F320 Major depressive disorder, single episode, mild: Secondary | ICD-10-CM | POA: Diagnosis not present

## 2018-07-04 DIAGNOSIS — M542 Cervicalgia: Secondary | ICD-10-CM | POA: Diagnosis not present

## 2018-07-04 DIAGNOSIS — I480 Paroxysmal atrial fibrillation: Secondary | ICD-10-CM | POA: Diagnosis not present

## 2018-07-04 DIAGNOSIS — N183 Chronic kidney disease, stage 3 (moderate): Secondary | ICD-10-CM | POA: Diagnosis not present

## 2018-07-04 LAB — CUP PACEART REMOTE DEVICE CHECK
Date Time Interrogation Session: 20191017024113
MDC IDC PG IMPLANT DT: 20171002

## 2018-07-15 DIAGNOSIS — J40 Bronchitis, not specified as acute or chronic: Secondary | ICD-10-CM | POA: Diagnosis not present

## 2018-07-15 DIAGNOSIS — R300 Dysuria: Secondary | ICD-10-CM | POA: Diagnosis not present

## 2018-07-25 ENCOUNTER — Ambulatory Visit (INDEPENDENT_AMBULATORY_CARE_PROVIDER_SITE_OTHER): Payer: Medicare Other

## 2018-07-25 ENCOUNTER — Telehealth: Payer: Self-pay

## 2018-07-25 DIAGNOSIS — R002 Palpitations: Secondary | ICD-10-CM | POA: Diagnosis not present

## 2018-07-25 DIAGNOSIS — I480 Paroxysmal atrial fibrillation: Secondary | ICD-10-CM

## 2018-07-25 NOTE — Telephone Encounter (Signed)
LMOVM requesting that pt send manual transmission b/c home monitor has not updated in at least 14 days.    

## 2018-07-26 NOTE — Progress Notes (Signed)
Carelink Summary Report / Loop Recorder 

## 2018-08-03 DIAGNOSIS — I480 Paroxysmal atrial fibrillation: Secondary | ICD-10-CM | POA: Diagnosis not present

## 2018-08-03 DIAGNOSIS — R300 Dysuria: Secondary | ICD-10-CM | POA: Diagnosis not present

## 2018-08-03 DIAGNOSIS — R232 Flushing: Secondary | ICD-10-CM | POA: Diagnosis not present

## 2018-08-03 DIAGNOSIS — N183 Chronic kidney disease, stage 3 (moderate): Secondary | ICD-10-CM | POA: Diagnosis not present

## 2018-08-03 DIAGNOSIS — I129 Hypertensive chronic kidney disease with stage 1 through stage 4 chronic kidney disease, or unspecified chronic kidney disease: Secondary | ICD-10-CM | POA: Diagnosis not present

## 2018-08-12 ENCOUNTER — Ambulatory Visit
Admission: RE | Admit: 2018-08-12 | Discharge: 2018-08-12 | Disposition: A | Discharge status: Home / Self Care | Payer: Medicare Other | Source: Ambulatory Visit | Attending: Geriatric Medicine | Admitting: Geriatric Medicine

## 2018-08-12 ENCOUNTER — Other Ambulatory Visit: Payer: Self-pay | Admitting: Geriatric Medicine

## 2018-08-12 DIAGNOSIS — R05 Cough: Secondary | ICD-10-CM | POA: Diagnosis not present

## 2018-08-12 DIAGNOSIS — R0602 Shortness of breath: Secondary | ICD-10-CM | POA: Diagnosis not present

## 2018-08-12 DIAGNOSIS — I129 Hypertensive chronic kidney disease with stage 1 through stage 4 chronic kidney disease, or unspecified chronic kidney disease: Secondary | ICD-10-CM | POA: Diagnosis not present

## 2018-08-12 DIAGNOSIS — J4521 Mild intermittent asthma with (acute) exacerbation: Secondary | ICD-10-CM

## 2018-08-12 DIAGNOSIS — N183 Chronic kidney disease, stage 3 (moderate): Secondary | ICD-10-CM | POA: Diagnosis not present

## 2018-08-12 DIAGNOSIS — I480 Paroxysmal atrial fibrillation: Secondary | ICD-10-CM | POA: Diagnosis not present

## 2018-08-18 DIAGNOSIS — R300 Dysuria: Secondary | ICD-10-CM | POA: Diagnosis not present

## 2018-08-18 DIAGNOSIS — N183 Chronic kidney disease, stage 3 (moderate): Secondary | ICD-10-CM | POA: Diagnosis not present

## 2018-08-18 DIAGNOSIS — I129 Hypertensive chronic kidney disease with stage 1 through stage 4 chronic kidney disease, or unspecified chronic kidney disease: Secondary | ICD-10-CM | POA: Diagnosis not present

## 2018-08-29 ENCOUNTER — Ambulatory Visit (INDEPENDENT_AMBULATORY_CARE_PROVIDER_SITE_OTHER): Payer: Medicare Other

## 2018-08-29 DIAGNOSIS — R002 Palpitations: Secondary | ICD-10-CM

## 2018-08-29 LAB — CUP PACEART REMOTE DEVICE CHECK
Implantable Pulse Generator Implant Date: 20171002
MDC IDC SESS DTM: 20191222033944

## 2018-08-29 NOTE — Progress Notes (Signed)
Carelink Summary Report / Loop Recorder 

## 2018-09-11 LAB — CUP PACEART REMOTE DEVICE CHECK
Date Time Interrogation Session: 20191119034026
Implantable Pulse Generator Implant Date: 20171002

## 2018-09-12 DIAGNOSIS — N390 Urinary tract infection, site not specified: Secondary | ICD-10-CM | POA: Diagnosis not present

## 2018-09-29 ENCOUNTER — Ambulatory Visit (INDEPENDENT_AMBULATORY_CARE_PROVIDER_SITE_OTHER): Payer: Medicare Other

## 2018-09-29 DIAGNOSIS — R002 Palpitations: Secondary | ICD-10-CM

## 2018-09-29 DIAGNOSIS — N182 Chronic kidney disease, stage 2 (mild): Secondary | ICD-10-CM | POA: Diagnosis not present

## 2018-09-29 DIAGNOSIS — N952 Postmenopausal atrophic vaginitis: Secondary | ICD-10-CM | POA: Diagnosis not present

## 2018-09-29 DIAGNOSIS — R300 Dysuria: Secondary | ICD-10-CM | POA: Diagnosis not present

## 2018-09-30 NOTE — Progress Notes (Signed)
Carelink Summary Report / Loop Recorder 

## 2018-10-02 LAB — CUP PACEART REMOTE DEVICE CHECK
Implantable Pulse Generator Implant Date: 20171002
MDC IDC SESS DTM: 20200124033722

## 2018-10-12 ENCOUNTER — Telehealth (HOSPITAL_COMMUNITY): Payer: Self-pay | Admitting: *Deleted

## 2018-10-12 ENCOUNTER — Other Ambulatory Visit (HOSPITAL_COMMUNITY): Payer: Self-pay | Admitting: *Deleted

## 2018-10-12 MED ORDER — DILTIAZEM HCL 30 MG PO TABS: 30.0000 mg | ORAL_TABLET | ORAL | 1 refills | Status: DC | PRN

## 2018-10-12 NOTE — Telephone Encounter (Signed)
Patient called in stating the few weeks she has intermittently felt "funny" or lightheaded intermittently. This morning she was doing laundry had an episode of feeling lightheaded and looked at her watch and her HR was registering 140-190 then back to 80. She has not taken any extra medication. She is going to send a remote transmission to identify what rhythm she is having. Instructed pt I would be back in touch after device looked at her transmission.

## 2018-10-12 NOTE — Telephone Encounter (Signed)
Pt in NSR at time of transmission last recorded AF episodes was on 09/17/2018.

## 2018-10-12 NOTE — Telephone Encounter (Signed)
Patient notified. No afib or tachycardia noted on remote transmission. BP was 126/84 with episode this morning. Per Roderic Palau NP patient should follow up with primary regarding lightheaded spells. Pt verbalized understanding.

## 2018-10-27 DIAGNOSIS — H906 Mixed conductive and sensorineural hearing loss, bilateral: Secondary | ICD-10-CM | POA: Diagnosis not present

## 2018-10-27 DIAGNOSIS — H6521 Chronic serous otitis media, right ear: Secondary | ICD-10-CM | POA: Diagnosis not present

## 2018-10-27 HISTORY — PX: MYRINGOTOMY WITH TUBE PLACEMENT: SHX5663

## 2018-10-31 ENCOUNTER — Telehealth: Payer: Self-pay

## 2018-10-31 NOTE — Telephone Encounter (Signed)
I helped the patient send a manual transmission. I told her if the nurse see anything she will give her a call back.

## 2018-11-01 ENCOUNTER — Ambulatory Visit (INDEPENDENT_AMBULATORY_CARE_PROVIDER_SITE_OTHER): Payer: Medicare Other | Admitting: *Deleted

## 2018-11-01 DIAGNOSIS — I480 Paroxysmal atrial fibrillation: Secondary | ICD-10-CM

## 2018-11-01 LAB — CUP PACEART REMOTE DEVICE CHECK
Implantable Pulse Generator Implant Date: 20171002
MDC IDC SESS DTM: 20200224213322

## 2018-11-01 NOTE — Telephone Encounter (Signed)
Spoke with pt regarding symptom from 10/29/2018 @ 22:28 pt stated that she just felt the atrial fib pt voiced compliance with Cardizem 180mg  daily as well as she took per PRN 30mg  Cardizem about 8:00pm, pt stated that she went to bed in AF but when she woke up she was felt like she was in NSR.

## 2018-11-08 NOTE — Progress Notes (Signed)
Carelink Summary Report / Loop Recorder 

## 2018-11-22 ENCOUNTER — Telehealth: Payer: Self-pay

## 2018-11-22 NOTE — Telephone Encounter (Signed)
I helped the patient send a manual transmission with her home monitor. I told the patient if the nurse see anything she will call her but if everything looks normal she will not receive a phone call.

## 2018-11-24 NOTE — Telephone Encounter (Signed)
Spoke with patient regarding symptom and AF episode from 11/18/18. Per ILR, AF burden 0.4%. AF episode duration on 3/13 was 1hr 57min per device, though symptom episode from 3/13 at 08:05 shows SVT with V rate at ~140bpm.   Pt reports feeling a rapid HR for at least 3hrs that morning. Took scheduled meds, including diltiazem 180mg  and Eliquis 5mg , then took additional 30mg  diltiazem 3 hours later. Symptoms dissipated after that. Advised pt I will send this message to Dr. Rayann Heman for review and recommendations. Explained we are trying to miniminze non-urgent OVs due to COVID-19. Encouraged pt to call with any new or persistent symptoms. She verbalizes understanding and thanked me for my call.

## 2018-11-27 NOTE — Telephone Encounter (Signed)
Atrial arrhythmias noted.  Looks like initially atrial flutter, then afib.  Will continue to monitor for now.  [name] MD, [place]

## 2018-11-28 DIAGNOSIS — N183 Chronic kidney disease, stage 3 (moderate): Secondary | ICD-10-CM | POA: Diagnosis not present

## 2018-11-28 DIAGNOSIS — R232 Flushing: Secondary | ICD-10-CM | POA: Diagnosis not present

## 2018-11-28 DIAGNOSIS — I129 Hypertensive chronic kidney disease with stage 1 through stage 4 chronic kidney disease, or unspecified chronic kidney disease: Secondary | ICD-10-CM | POA: Diagnosis not present

## 2018-11-28 DIAGNOSIS — I480 Paroxysmal atrial fibrillation: Secondary | ICD-10-CM | POA: Diagnosis not present

## 2018-11-29 ENCOUNTER — Other Ambulatory Visit (HOSPITAL_COMMUNITY): Payer: Self-pay | Admitting: *Deleted

## 2018-11-29 MED ORDER — APIXABAN 5 MG PO TABS: 5.0000 mg | ORAL_TABLET | Freq: Two times a day (BID) | ORAL | 2 refills | Status: DC

## 2018-12-05 ENCOUNTER — Ambulatory Visit (INDEPENDENT_AMBULATORY_CARE_PROVIDER_SITE_OTHER): Payer: Medicare Other | Admitting: *Deleted

## 2018-12-05 ENCOUNTER — Other Ambulatory Visit: Payer: Self-pay

## 2018-12-05 DIAGNOSIS — R002 Palpitations: Secondary | ICD-10-CM

## 2018-12-05 DIAGNOSIS — I480 Paroxysmal atrial fibrillation: Secondary | ICD-10-CM

## 2018-12-05 LAB — CUP PACEART REMOTE DEVICE CHECK
Date Time Interrogation Session: 20200330101045
Implantable Pulse Generator Implant Date: 20171002

## 2018-12-06 ENCOUNTER — Other Ambulatory Visit: Payer: Self-pay | Admitting: Internal Medicine

## 2018-12-12 NOTE — Progress Notes (Signed)
Carelink Summary Report / Loop Recorder 

## 2019-01-16 ENCOUNTER — Other Ambulatory Visit: Payer: Self-pay

## 2019-01-16 ENCOUNTER — Ambulatory Visit (INDEPENDENT_AMBULATORY_CARE_PROVIDER_SITE_OTHER): Payer: Medicare Other | Admitting: *Deleted

## 2019-01-16 DIAGNOSIS — R002 Palpitations: Secondary | ICD-10-CM

## 2019-01-16 DIAGNOSIS — I480 Paroxysmal atrial fibrillation: Secondary | ICD-10-CM

## 2019-01-16 LAB — CUP PACEART REMOTE DEVICE CHECK
Date Time Interrogation Session: 20200511104147
Implantable Pulse Generator Implant Date: 20171002

## 2019-01-23 ENCOUNTER — Telehealth (HOSPITAL_COMMUNITY): Payer: Self-pay | Admitting: *Deleted

## 2019-01-23 MED ORDER — DILTIAZEM HCL ER COATED BEADS 240 MG PO CP24: 240.0000 mg | ORAL_CAPSULE | Freq: Every day | ORAL | 3 refills | Status: DC

## 2019-01-23 NOTE — Telephone Encounter (Signed)
Pt called in stating she went into AF on Friday night. She has intermittently taken her cardizem 30mg  tabs when her BP allowed. She felt like she went back into rhythm last night but is back out again this morning. Her HR is in the 140s BP 106/88. Her pcp recently increased her cardizem to 240mg  a day due to elevated bp. She has taken her morning medications along with a 30mg  of cardizem - if still in AF tomorrow she will let me know as medications may need to be further adjusted. Pt in agreement.

## 2019-01-23 NOTE — Progress Notes (Signed)
Carelink Summary Report / Loop Recorder 

## 2019-01-24 ENCOUNTER — Encounter (HOSPITAL_COMMUNITY): Payer: Self-pay | Admitting: Nurse Practitioner

## 2019-01-24 ENCOUNTER — Encounter (HOSPITAL_COMMUNITY): Payer: Self-pay

## 2019-01-24 ENCOUNTER — Other Ambulatory Visit (HOSPITAL_COMMUNITY): Payer: Self-pay | Admitting: *Deleted

## 2019-01-24 ENCOUNTER — Other Ambulatory Visit: Payer: Self-pay

## 2019-01-24 ENCOUNTER — Ambulatory Visit (HOSPITAL_COMMUNITY)
Admission: RE | Admit: 2019-01-24 | Discharge: 2019-01-24 | Disposition: A | Discharge status: Home / Self Care | Payer: Medicare Other | Source: Ambulatory Visit | Attending: Nurse Practitioner | Admitting: Nurse Practitioner

## 2019-01-24 DIAGNOSIS — I4819 Other persistent atrial fibrillation: Secondary | ICD-10-CM | POA: Diagnosis not present

## 2019-01-24 NOTE — Progress Notes (Signed)
Electrophysiology TeleHealth Note   Due to national recommendations of social distancing due to [place] 19, Audio/video telehealth visit is felt to be most appropriate for this patient at this time.  See MyChart message/consent below  from today for patient consent regarding telehealth for the [facility].    Date:  01/24/2019   ID:  Frances [name], DOB 09-14-1933, MRN [id number]  Location: home Provider location: [place], [place] [address] Evaluation Performed: f/u persistent afib  PCP:  Lajean Manes, MD  Primary Cardiologist:   Dr. Rayann Heman Primary Electrophysiologist: Dr. Rayann Heman   CC: persistent afib since last Friday   History of Present Illness: Frances [name] is a 85 y.o. female who presents via video conferencing for a telehealth visit today.  Pt with h/o of 2 prior ablation 2017 and most recent 09/2017 that has experienced persistent afib since last Friday. Despite taking extra short acting Cardizem, she still sees some v rates up to 140 bpm. Her HR this am is 70 bpm, but BP cuff still indicates that the rhythm is irregular. She experiences lightheadedness and fatigue when in afib. She states no missed dose of eliquis for the last 3 weeks. She denies any change in her health/ no known triggers.  Today, she denies symptoms of palpitations, chest pain, shortness of breath, orthopnea, PND, lower extremity edema, claudication, dizziness, presyncope, syncope, bleeding, or neurologic sequela. The patient is tolerating medications without difficulties and is otherwise without complaint today.   she denies symptoms of cough, fevers, chills, or new SOB worrisome for COVID 19.    she has a BMI of There is no height or weight on file to calculate BMI.. There were no vitals filed for this visit.  Past Medical History:  Diagnosis Date  . Arthritis    hands, back  . Asthmatic bronchitis   . Atrial fibrillation ([place]) 07/31/2013  . Chronic  anticoagulation 07/30/2014  . Chronic kidney disease    stage 3 renal disease - no med  . Dizziness 07/30/2014  . Dysrhythmia    Hx - a-fib 05/2010 - tx with meds, no problem since 05/2010  . First degree AV block 07/30/2014  . Hearing loss    bilateral hearing aids  . Hypertension    controlled with meds  . Hypothyroidism   . Internal hemorrhoid   . PAF (paroxysmal atrial fibrillation) ([place]) 11/28/2015  . Paroxysmal atrial fibrillation ([facility])   . Peripheral vascular disease ([place])    right arm and right shoulder blood clots r/t a fall  . Persistent atrial fibrillation 09/28/2017  . Rectal bleeding   . SVD (spontaneous vaginal delivery) 1957, 1959   x 2  . Thyroid disease    hypothyroidism   Past Surgical History:  Procedure Laterality Date  . ABDOMINAL HYSTERECTOMY     and right ovary  . ANTERIOR AND POSTERIOR REPAIR N/A 01/03/2014   Procedure: ANTERIOR (CYSTOCELE) REPAIR;  Surgeon: Floyce Stakes. Pamala Hurry, MD;  Location: [facility];  Service: Gynecology;  Laterality: N/A;  . APPENDECTOMY    . ATRIAL FIBRILLATION ABLATION N/A 09/28/2017   Procedure: ATRIAL FIBRILLATION ABLATION;  Surgeon: [name], MD;  Location: [facility];  Service: Cardiovascular;  Laterality: N/A;  . BACK SURGERY     x 3 - 2 rods and 8 screws  . CARDIOVERSION N/A 08/24/2017   Procedure: CARDIOVERSION;  Surgeon: Jerline Pain, MD;  Location: [facility] END[facility];  Service: Cardiovascular;  Laterality: N/A;  . CARDIOVERSION N/A 10/19/2017  Procedure: CARDIOVERSION;  Surgeon: Josue Hector, MD;  Location: [facility] ENDOSCOPY;  Service: Cardiovascular;  Laterality: N/A;  . COLONOSCOPY    . CYSTOCELE REPAIR    . DENTAL SURGERY     infected tooth - general  . DILATION AND CURETTAGE OF UTERUS     x several  . ELECTROPHYSIOLOGIC STUDY N/A 11/28/2015   Procedure: Atrial Fibrillation Ablation;  Surgeon: [name], MD;  Location: [facility];  Service: Cardiovascular;  Laterality: N/A;  . EP IMPLANTABLE DEVICE N/A  06/08/2016   Procedure: Loop Recorder Insertion;  Surgeon: [name], MD;  Location: [facility];  Service: Cardiovascular;  Laterality: N/A;  . EXAMINATION UNDER ANESTHESIA N/A 01/15/2014   Procedure: EXAM UNDER ANESTHESIA with evacuation of hematoma and over sewing of vaginal mucosa.;  Surgeon: Floyce Stakes. Pamala Hurry, MD;  Location: [facility];  Service: Gynecology;  Laterality: N/A;  . HAND SURGERY     left  . hemorrhoid injection  04/2011  . JOINT REPLACEMENT     left thumb replacement  . TEE WITHOUT CARDIOVERSION N/A 09/27/2017   Procedure: TRANSESOPHAGEAL ECHOCARDIOGRAM (TEE);  Surgeon: Sueanne Margarita, MD;  Location: [facility] ENDOSCOPY;  Service: Cardiovascular;  Laterality: N/A;  . TONSILLECTOMY       Current Outpatient Medications  Medication Sig Dispense Refill  . acetaminophen (TYLENOL) 500 MG tablet Take 500-1,000 mg by mouth every 6 (six) hours as needed for moderate pain or headache.    Marland Kitchen apixaban (ELIQUIS) 5 MG TABS tablet Take 1 tablet (5 mg total) by mouth 2 (two) times daily. 180 tablet 2  . Biotin 5 MG CAPS Take 5 mg by mouth daily.     Marland Kitchen CALCIUM CITRATE PO Take 1 tablet by mouth daily.    Marland Kitchen diltiazem (CARDIZEM CD) 240 MG 24 hr capsule Take 1 capsule (240 mg total) by mouth daily. 90 capsule 3  . diltiazem (CARDIZEM) 30 MG tablet Take 1 tablet (30 mg total) by mouth every 4 (four) hours as needed (for fast heart rate > 100 as long as BP  > 100). 45 tablet 1  . hydrochlorothiazide (MICROZIDE) 12.5 MG capsule Take 12.5 mg by mouth daily.    . Inulin (FIBER CHOICE PREBIOTIC FIBER PO) Take 1 tablet by mouth 2 (two) times daily.    Marland Kitchen levothyroxine (SYNTHROID, LEVOTHROID) 75 MCG tablet Take 75 mcg by mouth at bedtime.     . psyllium (METAMUCIL) 58.6 % powder Take 1 packet by mouth daily.    . ramipril (ALTACE) 5 MG capsule Take 5 mg by mouth every evening.     . sodium chloride (OCEAN) 0.65 % SOLN nasal spray Place 2 sprays into both nostrils as needed for congestion.    . vitamin  B-12 (CYANOCOBALAMIN) 1000 MCG tablet Take 1,000 mcg by mouth daily.     No current facility-administered medications for this encounter.     Allergies:   Pneumovax 23 [pneumococcal vac polyvalent]; Codeine; Cephalexin; Sudafed [pseudoephedrine hcl]; and Sulfa antibiotics   Social History:  The patient  reports that she has never smoked. She has never used smokeless tobacco. She reports that she does not drink alcohol or use drugs.   Family History:  The patient's  family history includes Diabetes in her father; Heart attack in her mother and sister; Heart disease in her brother, mother, and sister; Heart failure in her brother; Hypertension in her father and sister; Stroke in her father; Sudden death in her maternal grandfather.    ROS:  Please  see the history of present illness.   All other systems are personally reviewed and negative.   Exam: Well appearing, alert and conversant, regular work of breathing,  good skin color  Recent Labs: No results found for requested labs within last 8760 hours.  personally reviewed    Other studies personally reviewed:  epic records    ASSESSMENT AND PLAN:  1.Persisitent symptomatic atrial fibrillation Pt has been out of rhythm x 5 days Discussed with pt elective cardioversion and she wants to pursue Also discussed with pt current covid guidelines with elective procedures She denies any symptoms today suggestive of covid Denies any missed doses of apixaban 5 mg bid for at least 3 weeks  This patients CHA2DS2-VASc Score and unadjusted Ischemic Stroke Rate (% per year) is equal to 4.8 % stroke rate/year from a score of 4  Above score calculated as 1 point each if present [CHF, HTN, DM, Vascular=MI/PAD/Aortic Plaque, Age if 65-74, or Female] Above score calculated as 2 points each if present [Age > 75, or Stroke/TIA/TE]  COVID screen The patient does not have any symptoms that suggest any further testing/ screening at this time.  Social  distancing reinforced today.   Follow-up:  2 weeks after cardioversion  Current medicines are reviewed at length with the patient today.   The patient does not have concerns regarding her medicines.  The following changes were made today:  none  Labs/ tests ordered today include: pre procedure cbc/bmet/ covid screen No orders of the defined types were placed in this encounter.   Patient Risk:  after full review of this patients clinical status, I feel that they are at moderate risk at this time.   Today, I have spent 15  minutes with the patient with telehealth technology discussing afib  Signed, Roderic Palau NP  01/24/2019 12:16 PM  Afib [facility], [place] [address] [phone]   I hereby voluntarily request, consent and authorize the [facility] and its employed or contracted physicians, physician assistants, nurse practitioners or other licensed health care professionals (the Practitioner), to provide me with telemedicine health care services (the "Services") as deemed necessary by the treating Practitioner. I acknowledge and consent to receive the Services by the Practitioner via telemedicine. I understand that the telemedicine visit will involve communicating with the Practitioner through live audiovisual communication technology and the disclosure of certain medical information by electronic transmission. I acknowledge that I have been given the opportunity to request an in-person assessment or other available alternative prior to the telemedicine visit and am voluntarily participating in the telemedicine visit.   I understand that I have the right to withhold or withdraw my consent to the use of telemedicine in the course of my care at any time, without affecting my right to future care or treatment, and that the Practitioner or I may terminate the telemedicine visit at any time. I understand that I have the right to  inspect all information obtained and/or recorded in the course of the telemedicine visit and may receive copies of available information for a reasonable fee.  I understand that some of the potential risks of receiving the Services via telemedicine include:   Delay or interruption in medical evaluation due to technological equipment failure or disruption;  Information transmitted may not be sufficient (e.g. poor resolution of images) to allow for appropriate medical decision making by the Practitioner; and/or  In rare instances, security protocols could fail, causing a breach of personal  health information.   Furthermore, I acknowledge that it is my responsibility to provide information about my medical history, conditions and care that is complete and accurate to the best of my ability. I acknowledge that Practitioner's advice, recommendations, and/or decision may be based on factors not within their control, such as incomplete or inaccurate data provided by me or distortions of diagnostic images or specimens that may result from electronic transmissions. I understand that the practice of medicine is not an exact science and that Practitioner makes no warranties or guarantees regarding treatment outcomes. I acknowledge that I will receive a copy of this consent concurrently upon execution via email to the email address I last provided but may also request a printed copy by calling the office of the [facility].  I understand that my insurance will be billed for this visit.   I have read or had this consent read to me.  I understand the contents of this consent, which adequately explains the benefits and risks of the Services being provided via telemedicine.  I have been provided ample opportunity to ask questions regarding this consent and the Services and have had my questions answered to my satisfaction.  I give my informed consent for the services to be provided through the use of  telemedicine in my medical care  By participating in this telemedicine visit I agree to the above.

## 2019-01-24 NOTE — Telephone Encounter (Signed)
Pt continues in AF. Not feeling well especially in the mornings. Set up for virtual visit with Roderic Palau NP for further assessment.

## 2019-01-26 ENCOUNTER — Other Ambulatory Visit: Payer: Self-pay

## 2019-01-26 ENCOUNTER — Ambulatory Visit (HOSPITAL_COMMUNITY)
Admission: RE | Admit: 2019-01-26 | Discharge: 2019-01-26 | Disposition: A | Discharge status: Home / Self Care | Payer: Medicare Other | Source: Ambulatory Visit | Attending: Physician Assistant | Admitting: Physician Assistant

## 2019-01-26 VITALS — BP 122/70 | HR 96

## 2019-01-26 DIAGNOSIS — I480 Paroxysmal atrial fibrillation: Secondary | ICD-10-CM | POA: Insufficient documentation

## 2019-01-26 LAB — CBC WITH DIFFERENTIAL/PLATELET
Abs Immature Granulocytes: 0.01 10*3/uL (ref 0.00–0.07)
Basophils Absolute: 0.1 10*3/uL (ref 0.0–0.1)
Basophils Relative: 1 %
Eosinophils Absolute: 0.2 10*3/uL (ref 0.0–0.5)
Eosinophils Relative: 3 %
HCT: 41.6 % (ref 36.0–46.0)
Hemoglobin: 13.4 g/dL (ref 12.0–15.0)
Immature Granulocytes: 0 %
Lymphocytes Relative: 29 %
Lymphs Abs: 2.1 10*3/uL (ref 0.7–4.0)
MCH: 27.3 pg (ref 26.0–34.0)
MCHC: 32.2 g/dL (ref 30.0–36.0)
MCV: 84.9 fL (ref 80.0–100.0)
Monocytes Absolute: 0.6 10*3/uL (ref 0.1–1.0)
Monocytes Relative: 8 %
Neutro Abs: 4.1 10*3/uL (ref 1.7–7.7)
Neutrophils Relative %: 59 %
Platelets: 217 10*3/uL (ref 150–400)
RBC: 4.9 MIL/uL (ref 3.87–5.11)
RDW: 14.1 % (ref 11.5–15.5)
WBC: 7.1 10*3/uL (ref 4.0–10.5)
nRBC: 0 % (ref 0.0–0.2)

## 2019-01-26 LAB — BASIC METABOLIC PANEL
Anion gap: 10 (ref 5–15)
BUN: 15 mg/dL (ref 8–23)
CO2: 25 mmol/L (ref 22–32)
Calcium: 9.6 mg/dL (ref 8.9–10.3)
Chloride: 104 mmol/L (ref 98–111)
Creatinine, Ser: 1.05 mg/dL — ABNORMAL HIGH (ref 0.44–1.00)
GFR calc Af Amer: 57 mL/min — ABNORMAL LOW (ref >60)
GFR calc non Af Amer: 49 mL/min — ABNORMAL LOW (ref >60)
Glucose, Bld: 108 mg/dL — ABNORMAL HIGH (ref 70–99)
Potassium: 3.6 mmol/L (ref 3.5–5.1)
Sodium: 139 mmol/L (ref 135–145)

## 2019-01-26 NOTE — Patient Instructions (Addendum)
Your cardioversion is scheduled for : TUES 01/31/2019 at 11:15 Arrive at the [facility] and go to admitting at 9:45 Do Not eat or drink anything after midnight the night prior to your procedure. Take all your medications with a sip of water prior to arrival. Do NOT miss any doses of your blood thinner. You will NOT be able to drive home after your procedure.    You will need to be covid tested 3 days prior to your procedure and will need to self isolate from the time of your test until your procedure.  Please be prepared to do so.  We will see you back 2 weeks after your cardioversion. This has been scheduled as well.  If you have any questions please call us at the number on the top of this page.

## 2019-01-26 NOTE — Progress Notes (Addendum)
Pt in for EKG.  Pt reports no SOB unless exerted.  To be reviewed by Adline Peals, PA  ECG today shows rate controlled afib HR 96, LAFB, slow R wave prog, QRS 80, QTc 459. Pt in persistent afib confirmed by loop recorder interrogation. She remains symptomatic with weakness and fatigue. Will arrange for DCCV and check Bmet/CBC. F/u with AF clinic 2 weeks post DCCV.

## 2019-01-27 ENCOUNTER — Other Ambulatory Visit (HOSPITAL_COMMUNITY)
Admission: RE | Admit: 2019-01-27 | Discharge: 2019-01-27 | Disposition: A | Discharge status: Home / Self Care | Payer: Medicare Other | Source: Ambulatory Visit | Attending: Cardiology | Admitting: Cardiology

## 2019-01-27 DIAGNOSIS — Z1159 Encounter for screening for other viral diseases: Secondary | ICD-10-CM | POA: Diagnosis not present

## 2019-01-28 LAB — NOVEL CORONAVIRUS, NAA (HOSP ORDER, SEND-OUT TO REF LAB; TAT 18-24 HRS): SARS-CoV-2, NAA: NOT DETECTED

## 2019-01-30 ENCOUNTER — Telehealth: Payer: Self-pay | Admitting: Physician Assistant

## 2019-01-30 NOTE — Telephone Encounter (Signed)
Paged by answering service, patient went into sinus rhythm this morning. She has cardioversion schedule for tomorrow. She can tell when goes out of rhythm.   During my conversation, her Apple Watch read as sinus rhythm at HR of 78. She does not know how to upload rhythm strip on My chart. She also has LINQ.  I have advised her that if she is in sinus, no need for cardioversion and no need to come. However, She will call us back in AM to make sure she stays in sinus. May try to get LINQ reading if needed.

## 2019-01-31 ENCOUNTER — Encounter (HOSPITAL_COMMUNITY): Payer: Self-pay | Admitting: Certified Registered"

## 2019-01-31 ENCOUNTER — Encounter (HOSPITAL_COMMUNITY): Admission: RE | Payer: Self-pay | Source: Home / Self Care

## 2019-01-31 ENCOUNTER — Ambulatory Visit (HOSPITAL_COMMUNITY): Admission: RE | Admit: 2019-01-31 | Payer: Medicare Other | Source: Home / Self Care | Admitting: Cardiology

## 2019-01-31 SURGERY — CARDIOVERSION
Anesthesia: General

## 2019-01-31 NOTE — Telephone Encounter (Addendum)
Patient states she feels to be going in and out of sinus rhythm. Yesterday she had normal readings via her apple watch. This morning her rhythm on linq is rate controlled and regular. Pt would like to cancel cardioversion since she feels to be going in and out - she has been feeling much better since yesterday. Cardioversion canceled.

## 2019-02-01 ENCOUNTER — Ambulatory Visit (HOSPITAL_COMMUNITY): Admit: 2019-02-01 | Payer: Medicare Other | Admitting: Cardiology

## 2019-02-01 ENCOUNTER — Encounter (HOSPITAL_COMMUNITY): Payer: Self-pay

## 2019-02-01 SURGERY — CARDIOVERSION
Anesthesia: General

## 2019-02-13 ENCOUNTER — Ambulatory Visit (HOSPITAL_COMMUNITY): Payer: Medicare Other | Admitting: Nurse Practitioner

## 2019-02-20 ENCOUNTER — Ambulatory Visit (INDEPENDENT_AMBULATORY_CARE_PROVIDER_SITE_OTHER): Payer: Medicare Other | Admitting: *Deleted

## 2019-02-20 DIAGNOSIS — R002 Palpitations: Secondary | ICD-10-CM

## 2019-02-20 LAB — CUP PACEART REMOTE DEVICE CHECK
Date Time Interrogation Session: 20200613113825
Implantable Pulse Generator Implant Date: 20171002

## 2019-02-27 NOTE — Progress Notes (Signed)
Carelink Summary Report / Loop Recorder 

## 2019-03-02 ENCOUNTER — Other Ambulatory Visit: Payer: Self-pay | Admitting: Geriatric Medicine

## 2019-03-02 DIAGNOSIS — Z1231 Encounter for screening mammogram for malignant neoplasm of breast: Secondary | ICD-10-CM

## 2019-03-23 ENCOUNTER — Ambulatory Visit (INDEPENDENT_AMBULATORY_CARE_PROVIDER_SITE_OTHER): Payer: Medicare Other | Admitting: *Deleted

## 2019-03-23 DIAGNOSIS — I480 Paroxysmal atrial fibrillation: Secondary | ICD-10-CM | POA: Diagnosis not present

## 2019-03-23 LAB — CUP PACEART REMOTE DEVICE CHECK
Date Time Interrogation Session: 20200716154108
Implantable Pulse Generator Implant Date: 20171002

## 2019-03-30 NOTE — Progress Notes (Signed)
Carelink Summary Report / Loop Recorder 

## 2019-04-17 ENCOUNTER — Other Ambulatory Visit: Payer: Self-pay

## 2019-04-17 ENCOUNTER — Ambulatory Visit
Admission: RE | Admit: 2019-04-17 | Discharge: 2019-04-17 | Disposition: A | Discharge status: Home / Self Care | Payer: Medicare Other | Source: Ambulatory Visit | Attending: Geriatric Medicine | Admitting: Geriatric Medicine

## 2019-04-17 DIAGNOSIS — Z1231 Encounter for screening mammogram for malignant neoplasm of breast: Secondary | ICD-10-CM

## 2019-04-25 ENCOUNTER — Ambulatory Visit (INDEPENDENT_AMBULATORY_CARE_PROVIDER_SITE_OTHER): Payer: Medicare Other | Admitting: *Deleted

## 2019-04-25 DIAGNOSIS — I480 Paroxysmal atrial fibrillation: Secondary | ICD-10-CM | POA: Diagnosis not present

## 2019-04-25 LAB — CUP PACEART REMOTE DEVICE CHECK
Date Time Interrogation Session: 20200818132334
Implantable Pulse Generator Implant Date: 20171002

## 2019-05-03 NOTE — Progress Notes (Signed)
Carelink Summary Report / Loop Recorder 

## 2019-05-08 DIAGNOSIS — I480 Paroxysmal atrial fibrillation: Secondary | ICD-10-CM | POA: Diagnosis not present

## 2019-05-08 DIAGNOSIS — J4521 Mild intermittent asthma with (acute) exacerbation: Secondary | ICD-10-CM | POA: Diagnosis not present

## 2019-05-08 DIAGNOSIS — I129 Hypertensive chronic kidney disease with stage 1 through stage 4 chronic kidney disease, or unspecified chronic kidney disease: Secondary | ICD-10-CM | POA: Diagnosis not present

## 2019-05-08 DIAGNOSIS — E039 Hypothyroidism, unspecified: Secondary | ICD-10-CM | POA: Diagnosis not present

## 2019-05-08 DIAGNOSIS — F320 Major depressive disorder, single episode, mild: Secondary | ICD-10-CM | POA: Diagnosis not present

## 2019-05-08 DIAGNOSIS — N183 Chronic kidney disease, stage 3 (moderate): Secondary | ICD-10-CM | POA: Diagnosis not present

## 2019-05-19 DIAGNOSIS — H903 Sensorineural hearing loss, bilateral: Secondary | ICD-10-CM | POA: Diagnosis not present

## 2019-05-24 DIAGNOSIS — Z23 Encounter for immunization: Secondary | ICD-10-CM | POA: Diagnosis not present

## 2019-05-28 LAB — CUP PACEART REMOTE DEVICE CHECK
Date Time Interrogation Session: 20200920153721
Implantable Pulse Generator Implant Date: 20171002

## 2019-05-29 ENCOUNTER — Ambulatory Visit (INDEPENDENT_AMBULATORY_CARE_PROVIDER_SITE_OTHER): Payer: Medicare Other | Admitting: *Deleted

## 2019-05-29 DIAGNOSIS — I480 Paroxysmal atrial fibrillation: Secondary | ICD-10-CM | POA: Diagnosis not present

## 2019-06-05 NOTE — Progress Notes (Signed)
Carelink Summary Report / Loop Recorder 

## 2019-06-08 ENCOUNTER — Ambulatory Visit (INDEPENDENT_AMBULATORY_CARE_PROVIDER_SITE_OTHER): Payer: Medicare Other | Admitting: Cardiology

## 2019-06-08 ENCOUNTER — Other Ambulatory Visit: Payer: Self-pay

## 2019-06-08 ENCOUNTER — Encounter: Payer: Self-pay | Admitting: Cardiology

## 2019-06-08 VITALS — BP 142/70 | HR 85 | Ht 65.0 in | Wt 173.8 lb

## 2019-06-08 DIAGNOSIS — Z95818 Presence of other cardiac implants and grafts: Secondary | ICD-10-CM | POA: Diagnosis not present

## 2019-06-08 DIAGNOSIS — I480 Paroxysmal atrial fibrillation: Secondary | ICD-10-CM | POA: Diagnosis not present

## 2019-06-08 DIAGNOSIS — Z7901 Long term (current) use of anticoagulants: Secondary | ICD-10-CM

## 2019-06-08 NOTE — Progress Notes (Signed)
Cardiology Office Note    Date:  06/08/2019   ID:  Frances [name], DOB Feb 01, 1934, MRN [id number]  PCP:  Lajean Manes, MD  Cardiologist:   [name], MD     History of Present Illness:  Frances [name] is a 85 y.o. female here for follow-up of paroxysmal atrial fibrillation with ablation therapy on 11/28/15 with hypothyroidism, CKD, hypertension, PAD. Dr. Rayann Heman. She was seen Previously by Roderic Palau and endorsed chest discomfort, pressure shortness of breath. Myoview was performed and was interpreted as intermediate risk.  Right after her ablation she did notice some symptoms of chest pressure and shortness of breath and she would have to stop and rest with going up stairs.  Still feeling hot flashes after several years has a little fan that she carries around with her. Sometimes this accompanies palpitations.  She will occasionally feel palpitations, and has had an implantable loop recorder in place to record potential atrial fibrillation episodes. She had a three-hour episode in December. She takes additional diltiazem if necessary.  She recounted her story of going to sleep evaluation on 10/28/2015 and feeling atrial fibrillation previous to this. She had a syncopal episode in the middle the night and was rushed to the emergency room.  05/03/17-has implantable loop recorder in place. She also reports her heart rate from Apple watch. She showed me a printout from [place] watch. She occasionally will feel an hour to episode of fast heart rates. Thankfully, loop recorder did not show any evidence of atrial fibrillation during this time. This may have been atrial tachycardia. Sinus tachycardia noted as well. Her husband is always concerned about her heart. She has to lay down and take an extra diltiazem if necessary.  05/03/18 -she had an eventful year, in February had cardioversion, took several attempts.  She has been seen in the atrial fibrillation clinic and by Dr. Rayann Heman.   Her last implantable loop recorder A. fib burden is excellent at 0.2%, was 30% at a prior visit.  No fevers chills nausea vomiting syncope bleeding.  06/08/2019- here for the follow-up of atrial fibrillation.  Doing very well. Has had brief episodes of atrial fibrillation.  She had one episode in May that was captured on EKG.  She was praying for a DC cardioversion.  Even had a COVID test.  Before she got there, she is auto converted.  Apple watch is helpful.  Denies any fevers chills nausea vomiting syncope bleeding.    Past Medical History:  Diagnosis Date  . Arthritis    hands, back  . Asthmatic bronchitis   . Atrial fibrillation ([place]) 07/31/2013  . Chronic anticoagulation 07/30/2014  . Chronic kidney disease    stage 3 renal disease - no med  . Dizziness 07/30/2014  . Dysrhythmia    Hx - a-fib 05/2010 - tx with meds, no problem since 05/2010  . First degree AV block 07/30/2014  . Hearing loss    bilateral hearing aids  . Hypertension    controlled with meds  . Hypothyroidism   . Internal hemorrhoid   . PAF (paroxysmal atrial fibrillation) ([place]) 11/28/2015  . Paroxysmal atrial fibrillation ([facility])   . Peripheral vascular disease ([place])    right arm and right shoulder blood clots r/t a fall  . Persistent atrial fibrillation ([place]) 09/28/2017  . Rectal bleeding   . SVD (spontaneous vaginal delivery) 1957, 1959   x 2  . Thyroid disease    hypothyroidism    Past Surgical History:  Procedure  Laterality Date  . ABDOMINAL HYSTERECTOMY     and right ovary  . ANTERIOR AND POSTERIOR REPAIR N/A 01/03/2014   Procedure: ANTERIOR (CYSTOCELE) REPAIR;  Surgeon: Floyce Stakes. Pamala Hurry, MD;  Location: [facility];  Service: Gynecology;  Laterality: N/A;  . APPENDECTOMY    . ATRIAL FIBRILLATION ABLATION N/A 09/28/2017   Procedure: ATRIAL FIBRILLATION ABLATION;  Surgeon: [name], MD;  Location: [facility];  Service: Cardiovascular;  Laterality: N/A;  . BACK SURGERY     x 3 - 2 rods and 8  screws  . CARDIOVERSION N/A 08/24/2017   Procedure: CARDIOVERSION;  Surgeon: Jerline Pain, MD;  Location: [facility]COPY;  Service: Cardiovascular;  Laterality: N/A;  . CARDIOVERSION N/A 10/19/2017   Procedure: CARDIOVERSION;  Surgeon: Josue Hector, MD;  Location: [facility]COPY;  Service: Cardiovascular;  Laterality: N/A;  . COLONOSCOPY    . CYSTOCELE REPAIR    . DENTAL SURGERY     infected tooth - general  . DILATION AND CURETTAGE OF UTERUS     x several  . ELECTROPHYSIOLOGIC STUDY N/A 11/28/2015   Procedure: Atrial Fibrillation Ablation;  Surgeon: [name], MD;  Location: [facility];  Service: Cardiovascular;  Laterality: N/A;  . EP IMPLANTABLE DEVICE N/A 06/08/2016   Procedure: Loop Recorder Insertion;  Surgeon: [name], MD;  Location: [facility];  Service: Cardiovascular;  Laterality: N/A;  . EXAMINATION UNDER ANESTHESIA N/A 01/15/2014   Procedure: EXAM UNDER ANESTHESIA with evacuation of hematoma and over sewing of vaginal mucosa.;  Surgeon: Floyce Stakes. Pamala Hurry, MD;  Location: [facility];  Service: Gynecology;  Laterality: N/A;  . HAND SURGERY     left  . hemorrhoid injection  04/2011  . JOINT REPLACEMENT     left thumb replacement  . TEE WITHOUT CARDIOVERSION N/A 09/27/2017   Procedure: TRANSESOPHAGEAL ECHOCARDIOGRAM (TEE);  Surgeon: Sueanne Margarita, MD;  Location: [facility];  Service: Cardiovascular;  Laterality: N/A;  . TONSILLECTOMY      Current Medications: Outpatient Medications Prior to Visit  Medication Sig Dispense Refill  . acetaminophen (TYLENOL) 500 MG tablet Take 500-1,000 mg by mouth every 6 (six) hours as needed for moderate pain or headache.    Marland Kitchen apixaban (ELIQUIS) 5 MG TABS tablet Take 1 tablet (5 mg total) by mouth 2 (two) times daily. 180 tablet 2  . Biotin 5 MG CAPS Take 5 mg by mouth daily.     . Calcium Carbonate-Vitamin D (CALCIUM-D PO) Take 1 tablet by mouth daily.    Marland Kitchen diltiazem (CARDIZEM CD) 240 MG 24 hr capsule Take 1 capsule (240 mg  total) by mouth daily. 90 capsule 3  . diltiazem (CARDIZEM) 30 MG tablet Take 1 tablet (30 mg total) by mouth every 4 (four) hours as needed (for fast heart rate > 100 as long as BP  > 100). 45 tablet 1  . hydrochlorothiazide (MICROZIDE) 12.5 MG capsule Take 12.5 mg by mouth daily.    . Inulin (FIBER CHOICE PREBIOTIC FIBER PO) Take 1 tablet by mouth 2 (two) times daily.    Marland Kitchen levothyroxine (SYNTHROID, LEVOTHROID) 75 MCG tablet Take 75 mcg by mouth at bedtime.     Marland Kitchen PARoxetine (PAXIL) 10 MG tablet Take 10-20 mg by mouth See admin instructions. Take 10 mg every other day, alternating days with 20 mg every other day    . Probiotic CAPS Take 1 capsule by mouth daily.    . psyllium (METAMUCIL) 58.6 % powder Take 1 packet by mouth daily.    Marland Kitchen  ramipril (ALTACE) 10 MG capsule Take 10 mg by mouth every evening.    . sodium chloride (OCEAN) 0.65 % SOLN nasal spray Place 2 sprays into both nostrils as needed for congestion.    . vitamin B-12 (CYANOCOBALAMIN) 1000 MCG tablet Take 1,000 mcg by mouth daily.     No facility-administered medications prior to visit.      Allergies:   Pneumovax 23 [pneumococcal vac polyvalent], Codeine, Cephalexin, Sudafed [pseudoephedrine hcl], and Sulfa antibiotics   Social History   Socioeconomic History  . Marital status: Married    Spouse name: Not on file  . Number of children: Not on file  . Years of education: Not on file  . Highest education level: Not on file  Occupational History  . Not on file  Social Needs  . Financial resource strain: Not on file  . Food insecurity    Worry: Not on file    Inability: Not on file  . Transportation needs    Medical: Not on file    Non-medical: Not on file  Tobacco Use  . Smoking status: Never Smoker  . Smokeless tobacco: Never Used  Substance and Sexual Activity  . Alcohol use: No  . Drug use: No  . Sexual activity: Not Currently    Birth control/protection: Post-menopausal  Lifestyle  . Physical activity     Days per week: Not on file    Minutes per session: Not on file  . Stress: Not on file  Relationships  . Social Herbalist on phone: Not on file    Gets together: Not on file    Attends religious service: Not on file    Active member of club or organization: Not on file    Attends meetings of clubs or organizations: Not on file    Relationship status: Not on file  Other Topics Concern  . Not on file  Social History Narrative  . Not on file     Family History:  The patient's family history includes Diabetes in her father; Heart attack in her mother and sister; Heart disease in her brother, mother, and sister; Heart failure in her brother; Hypertension in her father and sister; Stroke in her father; Sudden death in her maternal grandfather.   ROS:   Please see the history of present illness.    Review of Systems  All other systems reviewed and are negative.   PHYSICAL EXAM:   VS:  BP (!) 142/70   Pulse 85   Ht 5\' 5"  (1.651 m)   Wt 173 lb 12.8 oz (78.8 kg)   LMP  (LMP Unknown)   SpO2 98%   BMI 28.92 kg/m    GEN: Well nourished, well developed, in no acute distress  HEENT: normal  Neck: no JVD, carotid bruits, or masses Cardiac: RRR; no murmurs, rubs, or gallops,no edema  Respiratory:  clear to auscultation bilaterally, normal work of breathing GI: soft, nontender, nondistended, + BS MS: no deformity or atrophy  Skin: warm and dry, no rash Neuro:  Alert and Oriented x 3, Strength and sensation are intact Psych: euthymic mood, full affect    Wt Readings from Last 3 Encounters:  06/08/19 173 lb 12.8 oz (78.8 kg)  06/16/18 178 lb (80.7 kg)  05/03/18 175 lb 6.4 oz (79.6 kg)      Studies/Labs Reviewed:   EKG:  EKG is not ordered today.    Recent Labs: 01/26/2019: BUN 15; Creatinine, Ser 1.05; Hemoglobin 13.4; Platelets 217; Potassium  3.6; Sodium 139   Lipid Panel No results found for: CHOL, TRIG, HDL, CHOLHDL, VLDL, LDLCALC, LDLDIRECT  Additional  studies/ records that were reviewed today include:  Prior office visit, lab work, stress test reviewed  Myoview was previously determined to be intermediate risk however she is not currently having any symptoms, could have been artifactual.   ASSESSMENT:    1. PAF (paroxysmal atrial fibrillation) ([place])   2. Chronic anticoagulation   3. Status post placement of implantable loop recorder      PLAN:  In order of problems listed above:  Paroxysmal atrial fibrillation  - Post ablation x 2. 11/28/15 and 09/28/17 - Dr. Rayann Heman  - 01/26/19 - AFIB at that time. Was about to have DCCV then converted on her own.  She knows to take the extra diltiazem if need be.  She is an apple watch that also shows her if she is in A. fib.  She has a Linq monitor as well.  She had 2.5 hours of atrial fibrillation noted at last monitoring session.  Dyspnea  -Continue to encourage exercise.  Overall doing quite well with this.  Essential hypertension  - Overall has been under good control.  Stable.  Excellent.  Anticoagulation  -Continue with Eliquis.  Last year donut hole was an issue for her.  Currently doing well.  No bleeding.  Family history of CAD  - strong in 23's, mother, brother.  Her husband is afraid she is going to have an event.  She tries to reassure him.  1 year follow-up.  Medication Adjustments/Labs and Tests Ordered: Current medicines are reviewed at length with the patient today.  Concerns regarding medicines are outlined above.  Medication changes, Labs and Tests ordered today are listed in the Patient Instructions below. Patient Instructions  Medication Instructions:  The current medical regimen is effective;  continue present plan and medications.  If you need a refill on your cardiac medications before your next appointment, please call your pharmacy.   Follow-Up: At [facility], you and your health needs are our priority.  As part of our continuing mission to provide you with  exceptional heart care, we have created designated Provider Care Teams.  These Care Teams include your primary Cardiologist (physician) and Advanced Practice Providers (APPs -  Physician Assistants and Nurse Practitioners) who all work together to provide you with the care you need, when you need it. You will need a follow up appointment in 12 months.  Please call our office 2 months in advance to schedule this appointment.  You may see [name], MD or one of the following Advanced Practice Providers on your designated Care Team:   Truitt Merle, NP Cecilie Kicks, NP . Kathyrn Drown, NP  Thank you for choosing [facility]!!         Signed, [name], MD  06/08/2019 1:39 PM    [address], [place], [place]  [address] Phone: [phone]; Fax: [phone]

## 2019-06-08 NOTE — Patient Instructions (Signed)
Medication Instructions:  The current medical regimen is effective;  continue present plan and medications.  If you need a refill on your cardiac medications before your next appointment, please call your pharmacy.   Follow-Up: At [facility], you and your health needs are our priority.  As part of our continuing mission to provide you with exceptional heart care, we have created designated Provider Care Teams.  These Care Teams include your primary Cardiologist (physician) and Advanced Practice Providers (APPs -  Physician Assistants and Nurse Practitioners) who all work together to provide you with the care you need, when you need it. You will need a follow up appointment in 12 months.  Please call our office 2 months in advance to schedule this appointment.  You may see Mark Skains, MD or one of the following Advanced Practice Providers on your designated Care Team:   Lori Gerhardt, NP Laura Ingold, NP . Jill McDaniel, NP  Thank you for choosing [facility]!!      

## 2019-06-30 ENCOUNTER — Ambulatory Visit (INDEPENDENT_AMBULATORY_CARE_PROVIDER_SITE_OTHER): Payer: Medicare Other | Admitting: *Deleted

## 2019-06-30 DIAGNOSIS — I440 Atrioventricular block, first degree: Secondary | ICD-10-CM

## 2019-06-30 DIAGNOSIS — I4819 Other persistent atrial fibrillation: Secondary | ICD-10-CM

## 2019-07-01 LAB — CUP PACEART REMOTE DEVICE CHECK
Date Time Interrogation Session: 20201023154214
Implantable Pulse Generator Implant Date: 20171002

## 2019-07-07 DIAGNOSIS — I480 Paroxysmal atrial fibrillation: Secondary | ICD-10-CM | POA: Diagnosis not present

## 2019-07-07 DIAGNOSIS — N1831 Chronic kidney disease, stage 3a: Secondary | ICD-10-CM | POA: Diagnosis not present

## 2019-07-07 DIAGNOSIS — E039 Hypothyroidism, unspecified: Secondary | ICD-10-CM | POA: Diagnosis not present

## 2019-07-07 DIAGNOSIS — J4521 Mild intermittent asthma with (acute) exacerbation: Secondary | ICD-10-CM | POA: Diagnosis not present

## 2019-07-07 DIAGNOSIS — F320 Major depressive disorder, single episode, mild: Secondary | ICD-10-CM | POA: Diagnosis not present

## 2019-07-07 DIAGNOSIS — I129 Hypertensive chronic kidney disease with stage 1 through stage 4 chronic kidney disease, or unspecified chronic kidney disease: Secondary | ICD-10-CM | POA: Diagnosis not present

## 2019-07-07 NOTE — Progress Notes (Signed)
Carelink Summary Report / Loop Recorder 

## 2019-07-08 ENCOUNTER — Encounter (INDEPENDENT_AMBULATORY_CARE_PROVIDER_SITE_OTHER): Payer: Self-pay

## 2019-07-18 DIAGNOSIS — I480 Paroxysmal atrial fibrillation: Secondary | ICD-10-CM | POA: Diagnosis not present

## 2019-07-18 DIAGNOSIS — Z0000 Encounter for general adult medical examination without abnormal findings: Secondary | ICD-10-CM | POA: Diagnosis not present

## 2019-07-18 DIAGNOSIS — R232 Flushing: Secondary | ICD-10-CM | POA: Diagnosis not present

## 2019-07-18 DIAGNOSIS — F320 Major depressive disorder, single episode, mild: Secondary | ICD-10-CM | POA: Diagnosis not present

## 2019-07-18 DIAGNOSIS — J4521 Mild intermittent asthma with (acute) exacerbation: Secondary | ICD-10-CM | POA: Diagnosis not present

## 2019-07-18 DIAGNOSIS — D6869 Other thrombophilia: Secondary | ICD-10-CM | POA: Diagnosis not present

## 2019-07-18 DIAGNOSIS — Z79899 Other long term (current) drug therapy: Secondary | ICD-10-CM | POA: Diagnosis not present

## 2019-07-18 DIAGNOSIS — Z1389 Encounter for screening for other disorder: Secondary | ICD-10-CM | POA: Diagnosis not present

## 2019-07-18 DIAGNOSIS — I129 Hypertensive chronic kidney disease with stage 1 through stage 4 chronic kidney disease, or unspecified chronic kidney disease: Secondary | ICD-10-CM | POA: Diagnosis not present

## 2019-07-18 DIAGNOSIS — N1831 Chronic kidney disease, stage 3a: Secondary | ICD-10-CM | POA: Diagnosis not present

## 2019-07-18 DIAGNOSIS — E039 Hypothyroidism, unspecified: Secondary | ICD-10-CM | POA: Diagnosis not present

## 2019-07-25 DIAGNOSIS — I129 Hypertensive chronic kidney disease with stage 1 through stage 4 chronic kidney disease, or unspecified chronic kidney disease: Secondary | ICD-10-CM | POA: Diagnosis not present

## 2019-07-25 DIAGNOSIS — R300 Dysuria: Secondary | ICD-10-CM | POA: Diagnosis not present

## 2019-08-02 ENCOUNTER — Ambulatory Visit (INDEPENDENT_AMBULATORY_CARE_PROVIDER_SITE_OTHER): Payer: Medicare Other | Admitting: *Deleted

## 2019-08-02 DIAGNOSIS — I4819 Other persistent atrial fibrillation: Secondary | ICD-10-CM | POA: Diagnosis not present

## 2019-08-02 LAB — CUP PACEART REMOTE DEVICE CHECK
Date Time Interrogation Session: 20201125104159
Implantable Pulse Generator Implant Date: 20171002

## 2019-08-14 ENCOUNTER — Other Ambulatory Visit (HOSPITAL_COMMUNITY): Payer: Self-pay | Admitting: Nurse Practitioner

## 2019-08-14 NOTE — Telephone Encounter (Signed)
Prescription refill request for Eliquis received.  Last office visit: Skains, 06/08/2019 Scr: 1.05, 01/26/2019 Age: 85 y.o. Weight: 78.8 kg  Prescription refill sent.

## 2019-08-29 NOTE — Progress Notes (Signed)
ILR remote 

## 2019-09-04 ENCOUNTER — Ambulatory Visit (INDEPENDENT_AMBULATORY_CARE_PROVIDER_SITE_OTHER): Payer: Medicare Other | Admitting: *Deleted

## 2019-09-04 DIAGNOSIS — I4819 Other persistent atrial fibrillation: Secondary | ICD-10-CM | POA: Diagnosis not present

## 2019-09-05 LAB — CUP PACEART REMOTE DEVICE CHECK
Date Time Interrogation Session: 20201228112755
Implantable Pulse Generator Implant Date: 20171002

## 2019-09-18 ENCOUNTER — Ambulatory Visit: Payer: Medicare Other | Attending: Internal Medicine

## 2019-09-18 DIAGNOSIS — Z23 Encounter for immunization: Secondary | ICD-10-CM | POA: Diagnosis not present

## 2019-09-18 NOTE — Progress Notes (Signed)
   Covid-19 Vaccination Clinic  Name:  Frances [name]    MRN: [id number] DOB: Jan 27, 1934  09/18/2019  Frances [name] was observed post Covid-19 immunization for 30 minutes based on pre-vaccination screening without incidence. She was provided with Vaccine Information Sheet and instruction to access the V-Safe system.   Frances [name] was instructed to call 911 with any severe reactions post vaccine: Marland Kitchen Difficulty breathing  . Swelling of your face and throat  . A fast heartbeat  . A bad rash all over your body  . Dizziness and weakness

## 2019-09-19 ENCOUNTER — Ambulatory Visit: Payer: Medicare Other

## 2019-09-19 DIAGNOSIS — I480 Paroxysmal atrial fibrillation: Secondary | ICD-10-CM | POA: Diagnosis not present

## 2019-09-19 DIAGNOSIS — I129 Hypertensive chronic kidney disease with stage 1 through stage 4 chronic kidney disease, or unspecified chronic kidney disease: Secondary | ICD-10-CM | POA: Diagnosis not present

## 2019-09-22 ENCOUNTER — Telehealth (HOSPITAL_COMMUNITY): Payer: Self-pay | Admitting: *Deleted

## 2019-09-22 NOTE — Telephone Encounter (Signed)
Patient called in stating she went into AF last night. HR 100-120s BP 109/77. Already taken her normal 240mg  dose of cardizem this morning - she will hold her ramipril to ensure enough BP for PRN cardizem. If still in AF on Monday will call to come in for appointment. Pt verbalized understanding and agreement.

## 2019-09-25 NOTE — Telephone Encounter (Signed)
Pt continues in AF. Will bring in tomorrow per pt request

## 2019-09-26 ENCOUNTER — Other Ambulatory Visit: Payer: Self-pay

## 2019-09-26 ENCOUNTER — Ambulatory Visit (HOSPITAL_COMMUNITY)
Admission: RE | Admit: 2019-09-26 | Discharge: 2019-09-26 | Disposition: A | Discharge status: Home / Self Care | Payer: Medicare Other | Source: Ambulatory Visit | Attending: Nurse Practitioner | Admitting: Nurse Practitioner

## 2019-09-26 ENCOUNTER — Encounter (HOSPITAL_COMMUNITY): Payer: Self-pay | Admitting: Nurse Practitioner

## 2019-09-26 VITALS — BP 142/82 | HR 129 | Ht 65.0 in | Wt 172.2 lb

## 2019-09-26 DIAGNOSIS — I129 Hypertensive chronic kidney disease with stage 1 through stage 4 chronic kidney disease, or unspecified chronic kidney disease: Secondary | ICD-10-CM | POA: Diagnosis not present

## 2019-09-26 DIAGNOSIS — E039 Hypothyroidism, unspecified: Secondary | ICD-10-CM | POA: Diagnosis not present

## 2019-09-26 DIAGNOSIS — Z7901 Long term (current) use of anticoagulants: Secondary | ICD-10-CM | POA: Insufficient documentation

## 2019-09-26 DIAGNOSIS — Z885 Allergy status to narcotic agent status: Secondary | ICD-10-CM | POA: Diagnosis not present

## 2019-09-26 DIAGNOSIS — Z8249 Family history of ischemic heart disease and other diseases of the circulatory system: Secondary | ICD-10-CM | POA: Insufficient documentation

## 2019-09-26 DIAGNOSIS — Z887 Allergy status to serum and vaccine status: Secondary | ICD-10-CM | POA: Insufficient documentation

## 2019-09-26 DIAGNOSIS — Z882 Allergy status to sulfonamides status: Secondary | ICD-10-CM | POA: Diagnosis not present

## 2019-09-26 DIAGNOSIS — Z833 Family history of diabetes mellitus: Secondary | ICD-10-CM | POA: Diagnosis not present

## 2019-09-26 DIAGNOSIS — I4819 Other persistent atrial fibrillation: Secondary | ICD-10-CM | POA: Insufficient documentation

## 2019-09-26 DIAGNOSIS — D6869 Other thrombophilia: Secondary | ICD-10-CM

## 2019-09-26 DIAGNOSIS — Z823 Family history of stroke: Secondary | ICD-10-CM | POA: Insufficient documentation

## 2019-09-26 DIAGNOSIS — M1990 Unspecified osteoarthritis, unspecified site: Secondary | ICD-10-CM | POA: Diagnosis not present

## 2019-09-26 DIAGNOSIS — Z79890 Hormone replacement therapy (postmenopausal): Secondary | ICD-10-CM | POA: Insufficient documentation

## 2019-09-26 DIAGNOSIS — H9193 Unspecified hearing loss, bilateral: Secondary | ICD-10-CM | POA: Diagnosis not present

## 2019-09-26 DIAGNOSIS — Z881 Allergy status to other antibiotic agents status: Secondary | ICD-10-CM | POA: Insufficient documentation

## 2019-09-26 DIAGNOSIS — Z79899 Other long term (current) drug therapy: Secondary | ICD-10-CM | POA: Insufficient documentation

## 2019-09-26 DIAGNOSIS — I739 Peripheral vascular disease, unspecified: Secondary | ICD-10-CM | POA: Diagnosis not present

## 2019-09-26 DIAGNOSIS — N183 Chronic kidney disease, stage 3 unspecified: Secondary | ICD-10-CM | POA: Diagnosis not present

## 2019-09-26 DIAGNOSIS — Z86718 Personal history of other venous thrombosis and embolism: Secondary | ICD-10-CM | POA: Insufficient documentation

## 2019-09-26 DIAGNOSIS — Z888 Allergy status to other drugs, medicaments and biological substances status: Secondary | ICD-10-CM | POA: Insufficient documentation

## 2019-09-26 LAB — CBC
HCT: 44.3 % (ref 36.0–46.0)
Hemoglobin: 14.1 g/dL (ref 12.0–15.0)
MCH: 28 pg (ref 26.0–34.0)
MCHC: 31.8 g/dL (ref 30.0–36.0)
MCV: 87.9 fL (ref 80.0–100.0)
Platelets: 241 K/uL (ref 150–400)
RBC: 5.04 MIL/uL (ref 3.87–5.11)
RDW: 14.1 % (ref 11.5–15.5)
WBC: 6.4 K/uL (ref 4.0–10.5)
nRBC: 0 % (ref 0.0–0.2)

## 2019-09-26 LAB — BASIC METABOLIC PANEL
Anion gap: 13 (ref 5–15)
BUN: 14 mg/dL (ref 8–23)
CO2: 23 mmol/L (ref 22–32)
Calcium: 9.4 mg/dL (ref 8.9–10.3)
Chloride: 104 mmol/L (ref 98–111)
Creatinine, Ser: 1.02 mg/dL — ABNORMAL HIGH (ref 0.44–1.00)
GFR calc Af Amer: 59 mL/min — ABNORMAL LOW (ref >60)
GFR calc non Af Amer: 51 mL/min — ABNORMAL LOW (ref >60)
Glucose, Bld: 108 mg/dL — ABNORMAL HIGH (ref 70–99)
Potassium: 4.1 mmol/L (ref 3.5–5.1)
Sodium: 140 mmol/L (ref 135–145)

## 2019-09-26 MED ORDER — DILTIAZEM HCL ER COATED BEADS 300 MG PO CP24: 300.0000 mg | ORAL_CAPSULE | Freq: Every day | ORAL | 3 refills | Status: DC

## 2019-09-26 NOTE — Patient Instructions (Signed)
Cardioversion scheduled for Wednesday, January 27th  - Arrive at the [facility] and go to admitting at [facility] not eat or drink anything after midnight the night prior to your procedure.  - Take all your morning medication with a sip of water prior to arrival.  - You will not be able to drive home after your procedure.  Increase cardizem to 300mg  a day

## 2019-09-26 NOTE — Progress Notes (Signed)
Due to national recommendations of social distancing due to [place] 19, Audio/video telehealth visit is felt to be most appropriate for this patient at this time.  See MyChart message/consent below  from today for patient consent regarding telehealth for the At[facility] Fibrillation Clinic.    Date:  09/26/2019   ID:  Frances [name], DOB 13-Mar-1934, MRN [id number]  Location: Office  Provider location: [place], [place] [address] Evaluation Performed: f/u persistent afib  PCP:  Lajean Manes, MD  Primary Cardiologist:   Dr. Rayann Heman Primary Electrophysiologist: Dr. Rayann Heman   CC: persistent afib since last week    History of Present Illness: Frances [name] is a 86 y.o. female who presents  for persistent afib since last week.  Pt with h/o of 2 prior ablation 2017 and most recent 09/2017 that has experienced persistent afib since last week. No known triggers. Her PCP recently increased cardizem for BP issues but she has not started. V rate 129 bpm today. She had several days of afib last May but converted on her own and DCCV was cancelled. Continues on eliquis with a CHA2DS2VASc score of 4.   Today, she denies symptoms of palpitations, chest pain, shortness of breath, orthopnea, PND, lower extremity edema, claudication, dizziness, presyncope, syncope, bleeding, or neurologic sequela. The patient is tolerating medications without difficulties and is otherwise without complaint today.   she denies symptoms of cough, fevers, chills, or new SOB worrisome for COVID 19.    she has a BMI of Body mass index is 28.66 kg/m.Marland Kitchen Filed Weights   09/26/19 0849  Weight: 78.1 kg    Past Medical History:  Diagnosis Date  . Arthritis    hands, back  . Asthmatic bronchitis   . Atrial fibrillation ([place]) 07/31/2013  . Chronic anticoagulation 07/30/2014  . Chronic kidney disease    stage 3 renal disease - no med  . Dizziness 07/30/2014  . Dysrhythmia    Hx - a-fib 05/2010 - tx with  meds, no problem since 05/2010  . First degree AV block 07/30/2014  . Hearing loss    bilateral hearing aids  . Hypertension    controlled with meds  . Hypothyroidism   . Internal hemorrhoid   . PAF (paroxysmal atrial fibrillation) ([place]) 11/28/2015  . Paroxysmal atrial fibrillation ([facility])   . Peripheral vascular disease ([place])    right arm and right shoulder blood clots r/t a fall  . Persistent atrial fibrillation ([place]) 09/28/2017  . Rectal bleeding   . SVD (spontaneous vaginal delivery) 1957, 1959   x 2  . Thyroid disease    hypothyroidism   Past Surgical History:  Procedure Laterality Date  . ABDOMINAL HYSTERECTOMY     and right ovary  . ANTERIOR AND POSTERIOR REPAIR N/A 01/03/2014   Procedure: ANTERIOR (CYSTOCELE) REPAIR;  Surgeon: Floyce Stakes. Pamala Hurry, MD;  Location: [facility];  Service: Gynecology;  Laterality: N/A;  . APPENDECTOMY    . ATRIAL FIBRILLATION ABLATION N/A 09/28/2017   Procedure: ATRIAL FIBRILLATION ABLATION;  Surgeon: [name], MD;  Location: [facility];  Service: Cardiovascular;  Laterality: N/A;  . BACK SURGERY     x 3 - 2 rods and 8 screws  . CARDIOVERSION N/A 08/24/2017   Procedure: CARDIOVERSION;  Surgeon: Jerline Pain, MD;  Location: [facility];  Service: Cardiovascular;  Laterality: N/A;  . CARDIOVERSION N/A 10/19/2017   Procedure: CARDIOVERSION;  Surgeon: Josue Hector, MD;  Location: [facility];  Service: Cardiovascular;  Laterality: N/A;  . COLONOSCOPY    . CYSTOCELE REPAIR    . DENTAL SURGERY     infected tooth - general  . DILATION AND CURETTAGE OF UTERUS     x several  . ELECTROPHYSIOLOGIC STUDY N/A 11/28/2015   Procedure: Atrial Fibrillation Ablation;  Surgeon: [name], MD;  Location: [facility];  Service: Cardiovascular;  Laterality: N/A;  . EP IMPLANTABLE DEVICE N/A 06/08/2016   Procedure: Loop Recorder Insertion;  Surgeon: [name], MD;  Location: [facility];  Service: Cardiovascular;  Laterality: N/A;  .  EXAMINATION UNDER ANESTHESIA N/A 01/15/2014   Procedure: EXAM UNDER ANESTHESIA with evacuation of hematoma and over sewing of vaginal mucosa.;  Surgeon: Floyce Stakes. Pamala Hurry, MD;  Location: [name];  Service: Gynecology;  Laterality: N/A;  . HAND SURGERY     left  . hemorrhoid injection  04/2011  . JOINT REPLACEMENT     left thumb replacement  . MYRINGOTOMY WITH TUBE PLACEMENT Right 10/27/2018  . TEE WITHOUT CARDIOVERSION N/A 09/27/2017   Procedure: TRANSESOPHAGEAL ECHOCARDIOGRAM (TEE);  Surgeon: Sueanne Margarita, MD;  Location: [facility];  Service: Cardiovascular;  Laterality: N/A;  . TONSILLECTOMY       Current Outpatient Medications  Medication Sig Dispense Refill  . acetaminophen (TYLENOL) 500 MG tablet Take 500-1,000 mg by mouth every 6 (six) hours as needed for moderate pain or headache.    . Biotin 5 MG CAPS Take 5 mg by mouth daily.     . Calcium Carbonate-Vitamin D (CALCIUM-D PO) Take 1 tablet by mouth daily.    Marland Kitchen diltiazem (CARDIZEM CD) 300 MG 24 hr capsule Take 1 capsule (300 mg total) by mouth daily. 90 capsule 3  . diltiazem (CARDIZEM) 30 MG tablet Take 1 tablet (30 mg total) by mouth every 4 (four) hours as needed (for fast heart rate > 100 as long as BP  > 100). 45 tablet 1  . ELIQUIS 5 MG TABS tablet TAKE 1 TABLET BY MOUTH TWICE A DAY 180 tablet 1  . hydrochlorothiazide (MICROZIDE) 12.5 MG capsule Take 12.5 mg by mouth daily.    . Inulin (FIBER CHOICE PREBIOTIC FIBER PO) Take 1 tablet by mouth 2 (two) times daily.    Marland Kitchen levothyroxine (SYNTHROID, LEVOTHROID) 75 MCG tablet Take 75 mcg by mouth at bedtime.     Marland Kitchen PARoxetine (PAXIL) 10 MG tablet Take 10-20 mg by mouth See admin instructions. Take 10 mg every other day, alternating days with 20 mg every other day    . Probiotic CAPS Take 1 capsule by mouth daily.    . psyllium (METAMUCIL) 58.6 % powder Take 1 packet by mouth daily.    . ramipril (ALTACE) 10 MG capsule Take 10 mg by mouth every evening.    . sodium chloride (OCEAN)  0.65 % SOLN nasal spray Place 2 sprays into both nostrils as needed for congestion.    . vitamin B-12 (CYANOCOBALAMIN) 1000 MCG tablet Take 1,000 mcg by mouth daily.     No current facility-administered medications for this encounter.    Allergies:   Pneumovax 23 [pneumococcal vac polyvalent], Codeine, Cephalexin, Sudafed [pseudoephedrine hcl], and Sulfa antibiotics   Social History:  The patient  reports that she has never smoked. She has never used smokeless tobacco. She reports that she does not drink alcohol or use drugs.   Family History:  The patient's  family history includes Diabetes in her father; Heart attack in her mother and sister; Heart disease in her brother,  mother, and sister; Heart failure in her brother; Hypertension in her father and sister; Stroke in her father; Sudden death in her maternal grandfather.    ROS:  Please see the history of present illness.   All other systems are personally reviewed and negative.   Exam: GEN- The patient is well appearing, alert and oriented x 3 today.   Head- normocephalic, atraumatic Eyes-  Sclera clear, conjunctiva pink Ears- hearing intact Oropharynx- clear Neck- supple, no JVP Lymph- no cervical lymphadenopathy Lungs- Clear to ausculation bilaterally, normal work of breathing Heart- regular rate and rhythm, no murmurs, rubs or gallops, PMI not laterally displaced GI- soft, NT, ND, + BS Extremities- no clubbing, cyanosis, or edema MS- no significant deformity or atrophy Skin- no rash or lesion Psych- euthymic mood, full affect Neuro- strength and sensation are intact   Recent Labs: 01/26/2019: BUN 15; Creatinine, Ser 1.05; Hemoglobin 13.4; Platelets 217; Potassium 3.6; Sodium 139  personally reviewed    Other studies personally reviewed:  epic records    ASSESSMENT AND PLAN:  1.Persisitent symptomatic atrial fibrillation Pt has been out of rhythm x 7 days Discussed with pt elective cardioversion and she wants to  pursue Denies any missed doses of apixaban 5 mg bid for at least 3 weeks Increase Cardizem to 300 mg daily Cbc/cmet/covid  This patients CHA2DS2-VASc Score and unadjusted Ischemic Stroke Rate (% per year) is equal to 4.8 % stroke rate/year from a score of 4  Above score calculated as 1 point each if present [CHF, HTN, DM, Vascular=MI/PAD/Aortic Plaque, Age if 65-74, or Female] Above score calculated as 2 points each if present [Age > 75, or Stroke/TIA/TE]  COVID screen The patient does not have any symptoms that suggest any further testing/ screening at this time.  Social distancing reinforced today.   Follow-up:  1 week after cardioversion  Current medicines are reviewed at length with the patient today.   The patient does not have concerns regarding her medicines.  The following changes were made today:  none  Labs/ tests ordered today include: pre procedure cbc/bmet/ covid screen Orders Placed This Encounter  Procedures  . CBC  . Basic metabolic panel  . EKG 12-Lead  . EKG 12-Lead    Patient Risk:  after full review of this patients clinical status, I feel that they are at moderate risk at this time.   Today, I have spent 15  minutes with the patient with telehealth technology discussing afib  Signed, Roderic Palau NP  09/26/2019 9:41 AM  Afib [facility] [address] [place], [place] [address] [phone]

## 2019-09-29 DIAGNOSIS — I480 Paroxysmal atrial fibrillation: Secondary | ICD-10-CM | POA: Diagnosis not present

## 2019-09-29 DIAGNOSIS — E039 Hypothyroidism, unspecified: Secondary | ICD-10-CM | POA: Diagnosis not present

## 2019-09-29 DIAGNOSIS — I129 Hypertensive chronic kidney disease with stage 1 through stage 4 chronic kidney disease, or unspecified chronic kidney disease: Secondary | ICD-10-CM | POA: Diagnosis not present

## 2019-09-29 DIAGNOSIS — F320 Major depressive disorder, single episode, mild: Secondary | ICD-10-CM | POA: Diagnosis not present

## 2019-09-29 DIAGNOSIS — J4521 Mild intermittent asthma with (acute) exacerbation: Secondary | ICD-10-CM | POA: Diagnosis not present

## 2019-09-29 DIAGNOSIS — N183 Chronic kidney disease, stage 3 unspecified: Secondary | ICD-10-CM | POA: Diagnosis not present

## 2019-09-30 ENCOUNTER — Other Ambulatory Visit (HOSPITAL_COMMUNITY)
Admission: RE | Admit: 2019-09-30 | Discharge: 2019-09-30 | Disposition: A | Discharge status: Home / Self Care | Payer: Medicare Other | Source: Ambulatory Visit | Attending: Internal Medicine | Admitting: Internal Medicine

## 2019-09-30 DIAGNOSIS — Z01812 Encounter for preprocedural laboratory examination: Secondary | ICD-10-CM | POA: Insufficient documentation

## 2019-09-30 DIAGNOSIS — Z20822 Contact with and (suspected) exposure to covid-19: Secondary | ICD-10-CM | POA: Insufficient documentation

## 2019-09-30 LAB — SARS CORONAVIRUS 2 (TAT 6-24 HRS): SARS Coronavirus 2: NEGATIVE

## 2019-10-04 ENCOUNTER — Other Ambulatory Visit: Payer: Self-pay

## 2019-10-04 ENCOUNTER — Ambulatory Visit (HOSPITAL_COMMUNITY): Payer: Medicare Other | Admitting: Critical Care Medicine

## 2019-10-04 ENCOUNTER — Ambulatory Visit (HOSPITAL_COMMUNITY)
Admission: RE | Admit: 2019-10-04 | Discharge: 2019-10-04 | Disposition: A | Discharge status: Home / Self Care | Payer: Medicare Other | Attending: Internal Medicine | Admitting: Internal Medicine

## 2019-10-04 ENCOUNTER — Encounter (HOSPITAL_COMMUNITY): Payer: Self-pay | Admitting: Internal Medicine

## 2019-10-04 ENCOUNTER — Encounter (HOSPITAL_COMMUNITY)
Admission: RE | Disposition: A | Discharge status: Home / Self Care | Payer: Self-pay | Source: Home / Self Care | Attending: Internal Medicine

## 2019-10-04 DIAGNOSIS — I129 Hypertensive chronic kidney disease with stage 1 through stage 4 chronic kidney disease, or unspecified chronic kidney disease: Secondary | ICD-10-CM | POA: Diagnosis not present

## 2019-10-04 DIAGNOSIS — I739 Peripheral vascular disease, unspecified: Secondary | ICD-10-CM | POA: Diagnosis not present

## 2019-10-04 DIAGNOSIS — I4892 Unspecified atrial flutter: Secondary | ICD-10-CM | POA: Diagnosis not present

## 2019-10-04 DIAGNOSIS — I440 Atrioventricular block, first degree: Secondary | ICD-10-CM | POA: Insufficient documentation

## 2019-10-04 DIAGNOSIS — Z7901 Long term (current) use of anticoagulants: Secondary | ICD-10-CM | POA: Insufficient documentation

## 2019-10-04 DIAGNOSIS — I4819 Other persistent atrial fibrillation: Secondary | ICD-10-CM | POA: Diagnosis not present

## 2019-10-04 DIAGNOSIS — N183 Chronic kidney disease, stage 3 unspecified: Secondary | ICD-10-CM | POA: Diagnosis not present

## 2019-10-04 DIAGNOSIS — Z79899 Other long term (current) drug therapy: Secondary | ICD-10-CM | POA: Diagnosis not present

## 2019-10-04 DIAGNOSIS — E039 Hypothyroidism, unspecified: Secondary | ICD-10-CM | POA: Insufficient documentation

## 2019-10-04 DIAGNOSIS — J45909 Unspecified asthma, uncomplicated: Secondary | ICD-10-CM | POA: Diagnosis not present

## 2019-10-04 DIAGNOSIS — Z79890 Hormone replacement therapy (postmenopausal): Secondary | ICD-10-CM | POA: Diagnosis not present

## 2019-10-04 HISTORY — PX: CARDIOVERSION: SHX1299

## 2019-10-04 SURGERY — CARDIOVERSION
Anesthesia: General

## 2019-10-04 MED ORDER — SODIUM CHLORIDE 0.9 % IV SOLN
INTRAVENOUS | Status: AC | PRN
  Administered 2019-10-04: 500 mL via INTRAMUSCULAR

## 2019-10-04 MED ORDER — PROPOFOL 10 MG/ML IV BOLUS
INTRAVENOUS | Status: DC | PRN
  Administered 2019-10-04: 50 ug via INTRAVENOUS

## 2019-10-04 NOTE — H&P (Signed)
   INTERVAL PROCEDURE H&P  History and Physical Interval Note:  10/04/2019 9:15 AM  [name] has presented today for their planned procedure. The various methods of treatment have been discussed with the patient and family. After consideration of risks, benefits and other options for treatment, the patient has consented to the procedure.  The patients' outpatient history has been reviewed, patient examined, and no change in status from most recent office note within the past 30 days. I have reviewed the patients' chart and labs and will proceed as planned. Questions were answered to the patient's satisfaction.   Pixie Casino, MD, [facility], [place] Director of the Advanced Lipid Disorders &  Cardiovascular Risk Reduction Clinic Diplomate of the American Board of Clinical Lipidology Attending Cardiologist  Direct Dial: [phone]  Fax: [phone]  Website:  [email or web address] 10/04/2019, 9:15 AM

## 2019-10-04 NOTE — Anesthesia Postprocedure Evaluation (Signed)
Anesthesia Post Note  Patient: Frances [name]  Procedure(s) Performed: CARDIOVERSION (N/A )     Patient location during evaluation: Endoscopy Anesthesia Type: General Level of consciousness: awake and alert Pain management: pain level controlled Vital Signs Assessment: post-procedure vital signs reviewed and stable Respiratory status: spontaneous breathing, nonlabored ventilation, respiratory function stable and patient connected to nasal cannula oxygen Cardiovascular status: stable and blood pressure returned to baseline Postop Assessment: no apparent nausea or vomiting Anesthetic complications: no    Last Vitals:  Vitals:   10/04/19 0945 10/04/19 0956  BP: 129/76 126/88  Pulse: 84 89  Resp: (!) 22 17  Temp: 36.7 C   SpO2: 94% 97%    Last Pain:  Vitals:   10/04/19 0956  TempSrc:   PainSc: 0-No pain                 Drelyn Pistilli

## 2019-10-04 NOTE — Transfer of Care (Signed)
Immediate Anesthesia Transfer of Care Note  Patient: Frances [name]  Procedure(s) Performed: CARDIOVERSION (N/A )  Patient Location: Endoscopy Unit  Anesthesia Type:General  Level of Consciousness: awake, alert  and oriented  Airway & Oxygen Therapy: Patient Spontanous Breathing  Post-op Assessment: Report given to RN  Post vital signs: Reviewed and stable  Last Vitals:  Vitals Value Taken Time  BP    Temp    Pulse    Resp    SpO2      Last Pain:  Vitals:   10/04/19 0910  TempSrc: Oral  PainSc: 0-No pain         Complications: No apparent anesthesia complications

## 2019-10-04 NOTE — CV Procedure (Signed)
   CARDIOVERSION NOTE  Procedure: Electrical Cardioversion Indications:  Atrial Flutter  Procedure Details:  Consent: Risks of procedure as well as the alternatives and risks of each were explained to the (patient/caregiver).  Consent for procedure obtained.  Time Out: Verified patient identification, verified procedure, site/side was marked, verified correct patient position, special equipment/implants available, medications/allergies/relevent history reviewed, required imaging and test results available.  Performed  Patient placed on cardiac monitor, pulse oximetry, supplemental oxygen as necessary.  Sedation given: propofol per anesthesia Pacer pads placed anterior and posterior chest.  Cardioverted 1 time(s).  Cardioverted at 120J biphasic.  Impression: Findings: Post procedure EKG shows: NSR Complications: None Patient did tolerate procedure well.  Plan: 1. Successful DCCV of atrial flutter with 2:1 conduction using 120J biphasic energy.  Time Spent Directly with the Patient:  30 minutes   Pixie Casino, MD, [facility], [place] Director of the Advanced Lipid Disorders &  Cardiovascular Risk Reduction Clinic Diplomate of the American Board of Clinical Lipidology Attending Cardiologist  Direct Dial: [phone]  Fax: [phone]  Website:  [email or web address] 10/04/2019, 9:51 AM

## 2019-10-04 NOTE — Anesthesia Preprocedure Evaluation (Signed)
Anesthesia Evaluation  Patient identified by MRN, date of birth, ID band Patient awake    Reviewed: Allergy & Precautions, H&P , NPO status , Patient's Chart, lab work & pertinent test results  Airway Mallampati: II  TM Distance: >3 FB Neck ROM: Full    Dental  (+) Teeth Intact, Dental Advisory Given   Pulmonary asthma , neg recent URI,    breath sounds clear to auscultation       Cardiovascular hypertension, Pt. on medications + Peripheral Vascular Disease  + dysrhythmias Atrial Fibrillation  Rhythm:Irregular Rate:Normal     Neuro/Psych negative neurological ROS  negative psych ROS   GI/Hepatic negative GI ROS, Neg liver ROS,   Endo/Other  Hypothyroidism   Renal/GU Renal InsufficiencyRenal disease     Musculoskeletal  (+) Arthritis , Osteoarthritis,    Abdominal   Peds  Hematology negative hematology ROS (+)   Anesthesia Other Findings   Reproductive/Obstetrics                             Anesthesia Physical Anesthesia Plan  ASA: III  Anesthesia Plan: General   Post-op Pain Management:    Induction: Intravenous  PONV Risk Score and Plan: 3 and Treatment may vary due to age or medical condition  Airway Management Planned: Mask  Additional Equipment: None  Intra-op Plan:   Post-operative Plan:   Informed Consent: I have reviewed the patients History and Physical, chart, labs and discussed the procedure including the risks, benefits and alternatives for the proposed anesthesia with the patient or authorized representative who has indicated his/her understanding and acceptance.     Dental advisory given  Plan Discussed with: CRNA and Surgeon  Anesthesia Plan Comments:         Anesthesia Quick Evaluation

## 2019-10-04 NOTE — Discharge Instructions (Signed)
Electrical Cardioversion Electrical cardioversion is the delivery of a jolt of electricity to restore a normal rhythm to the heart. A rhythm that is too fast or is not regular keeps the heart from pumping well. In this procedure, sticky patches or metal paddles are placed on the chest to deliver electricity to the heart from a device. This procedure may be done in an emergency if:  There is low or no blood pressure as a result of the heart rhythm.  Normal rhythm must be restored as fast as possible to protect the brain and heart from further damage.  It may save a life. This may also be a scheduled procedure for irregular or fast heart rhythms that are not immediately life-threatening. Tell a health care provider about:  Any allergies you have.  All medicines you are taking, including vitamins, herbs, eye drops, creams, and over-the-counter medicines.  Any problems you or family members have had with anesthetic medicines.  Any blood disorders you have.  Any surgeries you have had.  Any medical conditions you have.  Whether you are pregnant or may be pregnant. What are the risks? Generally, this is a safe procedure. However, problems may occur, including:  Allergic reactions to medicines.  A blood clot that breaks free and travels to other parts of your body.  The possible return of an abnormal heart rhythm within hours or days after the procedure.  Your heart stopping (cardiac arrest). This is rare. What happens before the procedure? Medicines  Your health care provider may have you start taking: ? Blood-thinning medicines (anticoagulants) so your blood does not clot as easily. ? Medicines to help stabilize your heart rate and rhythm.  Ask your health care provider about: ? Changing or stopping your regular medicines. This is especially important if you are taking diabetes medicines or blood thinners. ? Taking medicines such as aspirin and ibuprofen. These medicines can  thin your blood. Do not take these medicines unless your health care provider tells you to take them. ? Taking over-the-counter medicines, vitamins, herbs, and supplements. General instructions  Follow instructions from your health care provider about eating or drinking restrictions.  Plan to have someone take you home from the hospital or clinic.  If you will be going home right after the procedure, plan to have someone with you for 24 hours.  Ask your health care provider what steps will be taken to help prevent infection. These may include washing your skin with a germ-killing soap. What happens during the procedure?   An IV will be inserted into one of your veins.  Sticky patches (electrodes) or metal paddles may be placed on your chest.  You will be given a medicine to help you relax (sedative).  An electrical shock will be delivered. The procedure may vary among health care providers and hospitals. What can I expect after the procedure?  Your blood pressure, heart rate, breathing rate, and blood oxygen level will be monitored until you leave the hospital or clinic.  Your heart rhythm will be watched to make sure it does not change.  You may have some redness on the skin where the shocks were given. Follow these instructions at home:  Do not drive for 24 hours if you were given a sedative during your procedure.  Take over-the-counter and prescription medicines only as told by your health care provider.  Ask your health care provider how to check your pulse. Check it often.  Rest for 48 hours after the procedure or   as told by your health care provider.  Avoid or limit your caffeine use as told by your health care provider.  Keep all follow-up visits as told by your health care provider. This is important. Contact a health care provider if:  You feel like your heart is beating too quickly or your pulse is not regular.  You have a serious muscle cramp that does not go  away. Get help right away if:  You have discomfort in your chest.  You are dizzy or you feel faint.  You have trouble breathing or you are short of breath.  Your speech is slurred.  You have trouble moving an arm or leg on one side of your body.  Your fingers or toes turn cold or blue. Summary  Electrical cardioversion is the delivery of a jolt of electricity to restore a normal rhythm to the heart.  This procedure may be done right away in an emergency or may be a scheduled procedure if the condition is not an emergency.  Generally, this is a safe procedure.  After the procedure, check your pulse often as told by your health care provider. This information is not intended to replace advice given to you by your health care provider. Make sure you discuss any questions you have with your health care provider. Document Revised: 03/27/2019 Document Reviewed: 03/27/2019 Elsevier Patient Education  2020 Elsevier Inc.  

## 2019-10-04 NOTE — Anesthesia Procedure Notes (Signed)
Procedure Name: General with mask airway Date/Time: 10/04/2019 9:40 AM Performed by: Wilburn Cornelia, CRNA Pre-anesthesia Checklist: Patient identified, Emergency Drugs available, Suction available, Patient being monitored and Timeout performed Patient Re-evaluated:Patient Re-evaluated prior to induction Oxygen Delivery Method: Ambu bag Preoxygenation: Pre-oxygenation with 100% oxygen Induction Type: IV induction Placement Confirmation: breath sounds checked- equal and bilateral Dental Injury: Teeth and Oropharynx as per pre-operative assessment

## 2019-10-05 ENCOUNTER — Encounter: Payer: Self-pay | Admitting: *Deleted

## 2019-10-07 ENCOUNTER — Ambulatory Visit: Payer: Medicare Other | Attending: Internal Medicine

## 2019-10-07 DIAGNOSIS — Z23 Encounter for immunization: Secondary | ICD-10-CM | POA: Insufficient documentation

## 2019-10-07 NOTE — Progress Notes (Signed)
   Covid-19 Vaccination Clinic  Name:  ERNESHA SCRIPTURE    MRN: [id number] DOB: Apr 05, 1934  10/07/2019  Ms. Camano was observed post Covid-19 immunization for 15 minutes without incidence. She was provided with Vaccine Information Sheet and instruction to access the V-Safe system.   Ms. Brett was instructed to call 911 with any severe reactions post vaccine: Marland Kitchen Difficulty breathing  . Swelling of your face and throat  . A fast heartbeat  . A bad rash all over your body  . Dizziness and weakness    Immunizations Administered    Name Date Dose VIS Date Route   Pfizer COVID-19 Vaccine 10/07/2019 11:59 AM 0.3 mL 08/18/2019 Intramuscular   Manufacturer: [place]   Lot: [id number]   [place]: [id number]

## 2019-10-09 ENCOUNTER — Ambulatory Visit (INDEPENDENT_AMBULATORY_CARE_PROVIDER_SITE_OTHER): Payer: Medicare Other | Admitting: *Deleted

## 2019-10-09 DIAGNOSIS — I4819 Other persistent atrial fibrillation: Secondary | ICD-10-CM

## 2019-10-09 LAB — CUP PACEART REMOTE DEVICE CHECK
Date Time Interrogation Session: 20210131235109
Implantable Pulse Generator Implant Date: 20171002

## 2019-10-09 NOTE — Progress Notes (Signed)
ILR Remote 

## 2019-10-11 ENCOUNTER — Encounter (HOSPITAL_COMMUNITY): Payer: Self-pay | Admitting: Nurse Practitioner

## 2019-10-11 ENCOUNTER — Ambulatory Visit (HOSPITAL_COMMUNITY)
Admission: RE | Admit: 2019-10-11 | Discharge: 2019-10-11 | Disposition: A | Discharge status: Home / Self Care | Payer: Medicare Other | Source: Ambulatory Visit | Attending: Nurse Practitioner | Admitting: Nurse Practitioner

## 2019-10-11 ENCOUNTER — Other Ambulatory Visit: Payer: Self-pay

## 2019-10-11 VITALS — BP 152/78 | HR 64 | Ht 65.0 in | Wt 173.6 lb

## 2019-10-11 DIAGNOSIS — N183 Chronic kidney disease, stage 3 unspecified: Secondary | ICD-10-CM | POA: Insufficient documentation

## 2019-10-11 DIAGNOSIS — Z888 Allergy status to other drugs, medicaments and biological substances status: Secondary | ICD-10-CM | POA: Diagnosis not present

## 2019-10-11 DIAGNOSIS — Z823 Family history of stroke: Secondary | ICD-10-CM | POA: Diagnosis not present

## 2019-10-11 DIAGNOSIS — Z8249 Family history of ischemic heart disease and other diseases of the circulatory system: Secondary | ICD-10-CM | POA: Diagnosis not present

## 2019-10-11 DIAGNOSIS — D6869 Other thrombophilia: Secondary | ICD-10-CM | POA: Diagnosis not present

## 2019-10-11 DIAGNOSIS — Z86718 Personal history of other venous thrombosis and embolism: Secondary | ICD-10-CM | POA: Diagnosis not present

## 2019-10-11 DIAGNOSIS — Z7901 Long term (current) use of anticoagulants: Secondary | ICD-10-CM | POA: Diagnosis not present

## 2019-10-11 DIAGNOSIS — Z881 Allergy status to other antibiotic agents status: Secondary | ICD-10-CM | POA: Insufficient documentation

## 2019-10-11 DIAGNOSIS — I4819 Other persistent atrial fibrillation: Secondary | ICD-10-CM | POA: Diagnosis not present

## 2019-10-11 DIAGNOSIS — Z885 Allergy status to narcotic agent status: Secondary | ICD-10-CM | POA: Insufficient documentation

## 2019-10-11 DIAGNOSIS — Z833 Family history of diabetes mellitus: Secondary | ICD-10-CM | POA: Insufficient documentation

## 2019-10-11 DIAGNOSIS — I4891 Unspecified atrial fibrillation: Secondary | ICD-10-CM | POA: Diagnosis present

## 2019-10-11 DIAGNOSIS — I129 Hypertensive chronic kidney disease with stage 1 through stage 4 chronic kidney disease, or unspecified chronic kidney disease: Secondary | ICD-10-CM | POA: Diagnosis not present

## 2019-10-11 DIAGNOSIS — E039 Hypothyroidism, unspecified: Secondary | ICD-10-CM | POA: Diagnosis not present

## 2019-10-11 DIAGNOSIS — Z79890 Hormone replacement therapy (postmenopausal): Secondary | ICD-10-CM | POA: Insufficient documentation

## 2019-10-11 DIAGNOSIS — Z79899 Other long term (current) drug therapy: Secondary | ICD-10-CM | POA: Diagnosis not present

## 2019-10-11 DIAGNOSIS — Z882 Allergy status to sulfonamides status: Secondary | ICD-10-CM | POA: Insufficient documentation

## 2019-10-11 DIAGNOSIS — Z887 Allergy status to serum and vaccine status: Secondary | ICD-10-CM | POA: Diagnosis not present

## 2019-10-11 DIAGNOSIS — M1990 Unspecified osteoarthritis, unspecified site: Secondary | ICD-10-CM | POA: Insufficient documentation

## 2019-10-11 DIAGNOSIS — H9193 Unspecified hearing loss, bilateral: Secondary | ICD-10-CM | POA: Insufficient documentation

## 2019-10-11 NOTE — Progress Notes (Signed)
Due to national recommendations of social distancing due to [place] 19, Audio/video telehealth visit is felt to be most appropriate for this patient at this time.  See MyChart message/consent below  from today for patient consent regarding telehealth for the [facility].    Date:  10/11/2019   ID:  Frances [name], DOB 1933/10/05, MRN [id number]  Location: Office  Provider location: [place], [place] [address] Evaluation Performed: f/u persistent afib  PCP:  Lajean Manes, MD  Primary Cardiologist:   Dr. Rayann Heman Primary Electrophysiologist: Dr. Rayann Heman   CC: persistent afib since last week    History of Present Illness: Frances [name] is a 86 y.o. female who presents  for persistent afib since last week.  Pt with h/o of 2 prior ablation 2017 and most recent 09/2017 that has experienced persistent afib since last week. No known triggers. Her PCP recently increased cardizem for BP issues but she has not started. V rate 129 bpm today. She had several days of afib last May but converted on her own and DCCV was cancelled. Continues on eliquis with a CHA2DS2VASc score of 4.   F/u in afib clinic, 10/11/19, after successful cardioversion.  She remains in SR. She feels improved. Has noted mild ankle edema since increasing cardizem to 300 mg daily. Received her second covid shot this last week.   Today, she denies symptoms of palpitations, chest pain, shortness of breath, orthopnea, PND, lower extremity edema, claudication, dizziness, presyncope, syncope, bleeding, or neurologic sequela. The patient is tolerating medications without difficulties and is otherwise without complaint today.   she denies symptoms of cough, fevers, chills, or new SOB worrisome for COVID 19.    she has a BMI of Body mass index is 28.89 kg/m.Marland Kitchen Filed Weights   10/11/19 0901  Weight: 78.7 kg    Past Medical History:  Diagnosis Date  . Arthritis    hands, back  . Asthmatic  bronchitis   . Atrial fibrillation ([place]) 07/31/2013  . Chronic anticoagulation 07/30/2014  . Chronic kidney disease    stage 3 renal disease - no med  . Dizziness 07/30/2014  . Dysrhythmia    Hx - a-fib 05/2010 - tx with meds, no problem since 05/2010  . First degree AV block 07/30/2014  . Hearing loss    bilateral hearing aids  . Hypertension    controlled with meds  . Hypothyroidism   . Internal hemorrhoid   . PAF (paroxysmal atrial fibrillation) ([place]) 11/28/2015  . Paroxysmal atrial fibrillation ([facility])   . Peripheral vascular disease ([place])    right arm and right shoulder blood clots r/t a fall  . Persistent atrial fibrillation ([place]) 09/28/2017  . Rectal bleeding   . SVD (spontaneous vaginal delivery) 1957, 1959   x 2  . Thyroid disease    hypothyroidism   Past Surgical History:  Procedure Laterality Date  . ABDOMINAL HYSTERECTOMY     and right ovary  . ANTERIOR AND POSTERIOR REPAIR N/A 01/03/2014   Procedure: ANTERIOR (CYSTOCELE) REPAIR;  Surgeon: Floyce Stakes. Pamala Hurry, MD;  Location: [place] [facility];  Service: Gynecology;  Laterality: N/A;  . APPENDECTOMY    . ATRIAL FIBRILLATION ABLATION N/A 09/28/2017   Procedure: ATRIAL FIBRILLATION ABLATION;  Surgeon: [name], MD;  Location: [place] [facility];  Service: Cardiovascular;  Laterality: N/A;  . BACK SURGERY     x 3 - 2 rods and 8 screws  . CARDIOVERSION N/A 08/24/2017   Procedure: CARDIOVERSION;  Surgeon: Jerline Pain, MD;  Location: [facility] ENDOSCOPY;  Service: Cardiovascular;  Laterality: N/A;  . CARDIOVERSION N/A 10/19/2017   Procedure: CARDIOVERSION;  Surgeon: Josue Hector, MD;  Location: [facility] ENDOSCOPY;  Service: Cardiovascular;  Laterality: N/A;  . CARDIOVERSION N/A 10/04/2019   Procedure: CARDIOVERSION;  Surgeon: Pixie Casino, MD;  Location: [facility] ENDOSCOPY;  Service: Cardiovascular;  Laterality: N/A;  . COLONOSCOPY    . CYSTOCELE REPAIR    . DENTAL SURGERY     infected tooth - general  . DILATION AND CURETTAGE OF UTERUS      x several  . ELECTROPHYSIOLOGIC STUDY N/A 11/28/2015   Procedure: Atrial Fibrillation Ablation;  Surgeon: [name], MD;  Location: [place] CV [facility];  Service: Cardiovascular;  Laterality: N/A;  . EP IMPLANTABLE DEVICE N/A 06/08/2016   Procedure: Loop Recorder Insertion;  Surgeon: [name], MD;  Location: [place] [facility];  Service: Cardiovascular;  Laterality: N/A;  . EXAMINATION UNDER ANESTHESIA N/A 01/15/2014   Procedure: EXAM UNDER ANESTHESIA with evacuation of hematoma and over sewing of vaginal mucosa.;  Surgeon: Floyce Stakes. Pamala Hurry, MD;  Location: [place] [facility];  Service: Gynecology;  Laterality: N/A;  . HAND SURGERY     left  . hemorrhoid injection  04/2011  . JOINT REPLACEMENT     left thumb replacement  . MYRINGOTOMY WITH TUBE PLACEMENT Right 10/27/2018  . TEE WITHOUT CARDIOVERSION N/A 09/27/2017   Procedure: TRANSESOPHAGEAL ECHOCARDIOGRAM (TEE);  Surgeon: Sueanne Margarita, MD;  Location: [facility] ENDOSCOPY;  Service: Cardiovascular;  Laterality: N/A;  . TONSILLECTOMY       Current Outpatient Medications  Medication Sig Dispense Refill  . acetaminophen (TYLENOL) 500 MG tablet Take 500-1,000 mg by mouth every 6 (six) hours as needed for moderate pain or headache.    . Biotin 5 MG CAPS Take 5 mg by mouth daily with lunch.     . Calcium Carb-Cholecalciferol (CALCIUM 600 + D PO) Take 1 tablet by mouth daily with lunch.    . diltiazem (CARDIZEM CD) 300 MG 24 hr capsule Take 1 capsule (300 mg total) by mouth daily. (Patient taking differently: Take 300 mg by mouth daily before breakfast. ) 90 capsule 3  . diltiazem (CARDIZEM) 30 MG tablet Take 1 tablet (30 mg total) by mouth every 4 (four) hours as needed (for fast heart rate > 100 as long as BP  > 100). 45 tablet 1  . ELIQUIS 5 MG TABS tablet TAKE 1 TABLET BY MOUTH TWICE A DAY (Patient taking differently: Take 5 mg by mouth 2 (two) times daily. ) 180 tablet 1  . famotidine (PEPCID) 10 MG tablet Take 10 mg by mouth 2 (two) times daily as needed  for heartburn or indigestion.    . hydrochlorothiazide (MICROZIDE) 12.5 MG capsule Take 12.5 mg by mouth daily.    . Inulin (FIBER CHOICE PREBIOTIC FIBER PO) Take 1 tablet by mouth 2 (two) times daily.    Marland Kitchen levothyroxine (SYNTHROID, LEVOTHROID) 75 MCG tablet Take 75 mcg by mouth daily before breakfast.     . PARoxetine (PAXIL) 20 MG tablet Take 20 mg by mouth daily.    . Probiotic Product (PROBIOTIC PO) Take 1 capsule by mouth daily with lunch.    . psyllium (METAMUCIL) 58.6 % powder Take 1 packet by mouth 4 (four) times a week.     . ramipril (ALTACE) 10 MG capsule Take 10 mg by mouth 2 (two) times daily.     . sodium chloride (OCEAN) 0.65 % SOLN nasal  spray Place 2 sprays into both nostrils as needed for congestion.    . vitamin B-12 (CYANOCOBALAMIN) 1000 MCG tablet Take 1,000 mcg by mouth daily with lunch.     No current facility-administered medications for this encounter.    Allergies:   Pneumovax 23 [pneumococcal vac polyvalent], Codeine, Cephalexin, Sudafed [pseudoephedrine hcl], and Sulfa antibiotics   Social History:  The patient  reports that she has never smoked. She has never used smokeless tobacco. She reports that she does not drink alcohol or use drugs.   Family History:  The patient's  family history includes Diabetes in her father; Heart attack in her mother and sister; Heart disease in her brother, mother, and sister; Heart failure in her brother; Hypertension in her father and sister; Stroke in her father; Sudden death in her maternal grandfather.    ROS:  Please see the history of present illness.   All other systems are personally reviewed and negative.   Exam: GEN- The patient is well appearing, alert and oriented x 3 today.   Head- normocephalic, atraumatic Eyes-  Sclera clear, conjunctiva pink Ears- hearing intact Oropharynx- clear Neck- supple, no JVP Lymph- no cervical lymphadenopathy Lungs- Clear to ausculation bilaterally, normal work of breathing Heart-  regular rate and rhythm, no murmurs, rubs or gallops, PMI not laterally displaced GI- soft, NT, ND, + BS Extremities- no clubbing, cyanosis, or edema MS- no significant deformity or atrophy Skin- no rash or lesion Psych- euthymic mood, full affect Neuro- strength and sensation are intact   Recent Labs: 09/26/2019: BUN 14; Creatinine, Ser 1.02; Hemoglobin 14.1; Platelets 241; Potassium 4.1; Sodium 140  personally reviewed    Other studies personally reviewed:  epic records    ASSESSMENT AND PLAN:  1.Persisitent symptomatic atrial fibrillation Pt had been out of rhythm x 7 days Successful cardioversion and continues in SR Continue apixaban 5 mg bid for at least 3 weeks Continue Cardizem at  300 mg daily  This patients CHA2DS2-VASc Score and unadjusted Ischemic Stroke Rate (% per year) is equal to 4.8 % stroke rate/year from a score of 4  Above score calculated as 1 point each if present [CHF, HTN, DM, Vascular=MI/PAD/Aortic Plaque, Age if 65-74, or Female] Above score calculated as 2 points each if present [Age > 75, or Stroke/TIA/TE]  COVID screen The patient does not have any symptoms that suggest any further testing/ screening at this time.  Social distancing reinforced today.   Follow-up:  As needed in afib clinic, remote checks as scheduled   Current medicines are reviewed at length with the patient today.   The patient does not have concerns regarding her medicines.  The following changes were made today:  none  Labs/ tests ordered today include: pre procedure cbc/bmet/ covid screen Orders Placed This Encounter  Procedures  . EKG 12-Lead  . EKG 12-Lead    Patient Risk:  after full review of this patients clinical status, I feel that they are at moderate risk at this time.    Signed, Roderic Palau NP  10/11/2019 9:41 AM  Afib [facility] [address] [place], [place] [address] [phone]

## 2019-10-12 DIAGNOSIS — I129 Hypertensive chronic kidney disease with stage 1 through stage 4 chronic kidney disease, or unspecified chronic kidney disease: Secondary | ICD-10-CM | POA: Diagnosis not present

## 2019-10-12 DIAGNOSIS — E039 Hypothyroidism, unspecified: Secondary | ICD-10-CM | POA: Diagnosis not present

## 2019-10-12 DIAGNOSIS — N183 Chronic kidney disease, stage 3 unspecified: Secondary | ICD-10-CM | POA: Diagnosis not present

## 2019-10-12 DIAGNOSIS — J4521 Mild intermittent asthma with (acute) exacerbation: Secondary | ICD-10-CM | POA: Diagnosis not present

## 2019-10-12 DIAGNOSIS — F320 Major depressive disorder, single episode, mild: Secondary | ICD-10-CM | POA: Diagnosis not present

## 2019-10-12 DIAGNOSIS — I480 Paroxysmal atrial fibrillation: Secondary | ICD-10-CM | POA: Diagnosis not present

## 2019-11-13 ENCOUNTER — Ambulatory Visit (INDEPENDENT_AMBULATORY_CARE_PROVIDER_SITE_OTHER): Payer: Medicare Other | Admitting: *Deleted

## 2019-11-13 DIAGNOSIS — I4819 Other persistent atrial fibrillation: Secondary | ICD-10-CM

## 2019-11-13 LAB — CUP PACEART REMOTE DEVICE CHECK
Date Time Interrogation Session: 20210308022627
Implantable Pulse Generator Implant Date: 20171002

## 2019-11-13 NOTE — Progress Notes (Signed)
ILR Remote 

## 2019-11-29 ENCOUNTER — Telehealth: Payer: Self-pay | Admitting: Emergency Medicine

## 2019-11-29 NOTE — Telephone Encounter (Signed)
LINQ reached RRT  11/28/19. Patient contacted and given option to leave device in place or to be scheduled for an appointment with Dr Rayann Heman for an in office extraction. Patient's husband is going through cancer treatment at this time and she will contact the office if or when she decides to have the [facility] extracted. Instructed patient to unplug remote monitor and will have return kit sent to her home.

## 2019-12-18 DIAGNOSIS — F320 Major depressive disorder, single episode, mild: Secondary | ICD-10-CM | POA: Diagnosis not present

## 2019-12-18 DIAGNOSIS — J4521 Mild intermittent asthma with (acute) exacerbation: Secondary | ICD-10-CM | POA: Diagnosis not present

## 2019-12-18 DIAGNOSIS — N183 Chronic kidney disease, stage 3 unspecified: Secondary | ICD-10-CM | POA: Diagnosis not present

## 2019-12-18 DIAGNOSIS — E039 Hypothyroidism, unspecified: Secondary | ICD-10-CM | POA: Diagnosis not present

## 2019-12-18 DIAGNOSIS — I129 Hypertensive chronic kidney disease with stage 1 through stage 4 chronic kidney disease, or unspecified chronic kidney disease: Secondary | ICD-10-CM | POA: Diagnosis not present

## 2019-12-18 DIAGNOSIS — I480 Paroxysmal atrial fibrillation: Secondary | ICD-10-CM | POA: Diagnosis not present

## 2020-01-18 ENCOUNTER — Other Ambulatory Visit: Payer: Self-pay | Admitting: Geriatric Medicine

## 2020-01-18 DIAGNOSIS — D3502 Benign neoplasm of left adrenal gland: Secondary | ICD-10-CM | POA: Diagnosis not present

## 2020-01-18 DIAGNOSIS — D6869 Other thrombophilia: Secondary | ICD-10-CM | POA: Diagnosis not present

## 2020-01-18 DIAGNOSIS — I480 Paroxysmal atrial fibrillation: Secondary | ICD-10-CM | POA: Diagnosis not present

## 2020-01-18 DIAGNOSIS — Z79899 Other long term (current) drug therapy: Secondary | ICD-10-CM | POA: Diagnosis not present

## 2020-01-18 DIAGNOSIS — I129 Hypertensive chronic kidney disease with stage 1 through stage 4 chronic kidney disease, or unspecified chronic kidney disease: Secondary | ICD-10-CM | POA: Diagnosis not present

## 2020-01-31 DIAGNOSIS — I480 Paroxysmal atrial fibrillation: Secondary | ICD-10-CM | POA: Diagnosis not present

## 2020-01-31 DIAGNOSIS — E039 Hypothyroidism, unspecified: Secondary | ICD-10-CM | POA: Diagnosis not present

## 2020-01-31 DIAGNOSIS — I129 Hypertensive chronic kidney disease with stage 1 through stage 4 chronic kidney disease, or unspecified chronic kidney disease: Secondary | ICD-10-CM | POA: Diagnosis not present

## 2020-01-31 DIAGNOSIS — F320 Major depressive disorder, single episode, mild: Secondary | ICD-10-CM | POA: Diagnosis not present

## 2020-02-02 ENCOUNTER — Other Ambulatory Visit: Payer: Self-pay

## 2020-02-02 ENCOUNTER — Ambulatory Visit
Admission: RE | Admit: 2020-02-02 | Discharge: 2020-02-02 | Disposition: A | Discharge status: Home / Self Care | Payer: Medicare Other | Source: Ambulatory Visit | Attending: Geriatric Medicine | Admitting: Geriatric Medicine

## 2020-02-02 DIAGNOSIS — D3502 Benign neoplasm of left adrenal gland: Secondary | ICD-10-CM

## 2020-02-02 DIAGNOSIS — N200 Calculus of kidney: Secondary | ICD-10-CM | POA: Diagnosis not present

## 2020-02-13 ENCOUNTER — Other Ambulatory Visit (HOSPITAL_COMMUNITY): Payer: Self-pay | Admitting: Nurse Practitioner

## 2020-02-20 DIAGNOSIS — H524 Presbyopia: Secondary | ICD-10-CM | POA: Diagnosis not present

## 2020-02-20 DIAGNOSIS — H26491 Other secondary cataract, right eye: Secondary | ICD-10-CM | POA: Diagnosis not present

## 2020-02-20 DIAGNOSIS — H5201 Hypermetropia, right eye: Secondary | ICD-10-CM | POA: Diagnosis not present

## 2020-02-20 DIAGNOSIS — H52223 Regular astigmatism, bilateral: Secondary | ICD-10-CM | POA: Diagnosis not present

## 2020-03-12 DIAGNOSIS — Z9841 Cataract extraction status, right eye: Secondary | ICD-10-CM | POA: Diagnosis not present

## 2020-03-12 DIAGNOSIS — H524 Presbyopia: Secondary | ICD-10-CM | POA: Diagnosis not present

## 2020-03-12 DIAGNOSIS — H52223 Regular astigmatism, bilateral: Secondary | ICD-10-CM | POA: Diagnosis not present

## 2020-03-12 DIAGNOSIS — H5201 Hypermetropia, right eye: Secondary | ICD-10-CM | POA: Diagnosis not present

## 2020-03-26 ENCOUNTER — Other Ambulatory Visit: Payer: Self-pay | Admitting: Geriatric Medicine

## 2020-03-26 DIAGNOSIS — Z1231 Encounter for screening mammogram for malignant neoplasm of breast: Secondary | ICD-10-CM

## 2020-04-03 DIAGNOSIS — L729 Follicular cyst of the skin and subcutaneous tissue, unspecified: Secondary | ICD-10-CM | POA: Diagnosis not present

## 2020-04-03 DIAGNOSIS — N343 Urethral syndrome, unspecified: Secondary | ICD-10-CM | POA: Diagnosis not present

## 2020-04-19 ENCOUNTER — Other Ambulatory Visit: Payer: Self-pay

## 2020-04-19 ENCOUNTER — Ambulatory Visit
Admission: RE | Admit: 2020-04-19 | Discharge: 2020-04-19 | Disposition: A | Discharge status: Home / Self Care | Payer: Medicare Other | Source: Ambulatory Visit | Attending: Geriatric Medicine | Admitting: Geriatric Medicine

## 2020-04-19 DIAGNOSIS — Z1231 Encounter for screening mammogram for malignant neoplasm of breast: Secondary | ICD-10-CM | POA: Diagnosis not present

## 2020-05-15 DIAGNOSIS — I480 Paroxysmal atrial fibrillation: Secondary | ICD-10-CM | POA: Diagnosis not present

## 2020-05-15 DIAGNOSIS — F320 Major depressive disorder, single episode, mild: Secondary | ICD-10-CM | POA: Diagnosis not present

## 2020-05-15 DIAGNOSIS — I129 Hypertensive chronic kidney disease with stage 1 through stage 4 chronic kidney disease, or unspecified chronic kidney disease: Secondary | ICD-10-CM | POA: Diagnosis not present

## 2020-05-15 DIAGNOSIS — E039 Hypothyroidism, unspecified: Secondary | ICD-10-CM | POA: Diagnosis not present

## 2020-05-27 ENCOUNTER — Other Ambulatory Visit: Payer: Self-pay

## 2020-05-27 ENCOUNTER — Ambulatory Visit (HOSPITAL_COMMUNITY)
Admission: RE | Admit: 2020-05-27 | Discharge: 2020-05-27 | Disposition: A | Discharge status: Home / Self Care | Payer: Medicare Other | Source: Ambulatory Visit | Attending: Nurse Practitioner | Admitting: Nurse Practitioner

## 2020-05-27 VITALS — BP 118/54 | HR 98 | Ht 65.0 in | Wt 170.0 lb

## 2020-05-27 DIAGNOSIS — N183 Chronic kidney disease, stage 3 unspecified: Secondary | ICD-10-CM | POA: Insufficient documentation

## 2020-05-27 DIAGNOSIS — I4819 Other persistent atrial fibrillation: Secondary | ICD-10-CM | POA: Diagnosis not present

## 2020-05-27 DIAGNOSIS — E039 Hypothyroidism, unspecified: Secondary | ICD-10-CM | POA: Diagnosis not present

## 2020-05-27 DIAGNOSIS — Z7901 Long term (current) use of anticoagulants: Secondary | ICD-10-CM | POA: Insufficient documentation

## 2020-05-27 DIAGNOSIS — Z79899 Other long term (current) drug therapy: Secondary | ICD-10-CM | POA: Diagnosis not present

## 2020-05-27 DIAGNOSIS — Z9622 Myringotomy tube(s) status: Secondary | ICD-10-CM | POA: Diagnosis not present

## 2020-05-27 DIAGNOSIS — I739 Peripheral vascular disease, unspecified: Secondary | ICD-10-CM | POA: Diagnosis not present

## 2020-05-27 DIAGNOSIS — I440 Atrioventricular block, first degree: Secondary | ICD-10-CM | POA: Diagnosis not present

## 2020-05-27 DIAGNOSIS — D6869 Other thrombophilia: Secondary | ICD-10-CM

## 2020-05-27 DIAGNOSIS — Z9079 Acquired absence of other genital organ(s): Secondary | ICD-10-CM | POA: Insufficient documentation

## 2020-05-27 DIAGNOSIS — Z823 Family history of stroke: Secondary | ICD-10-CM | POA: Diagnosis not present

## 2020-05-27 DIAGNOSIS — J45909 Unspecified asthma, uncomplicated: Secondary | ICD-10-CM | POA: Diagnosis not present

## 2020-05-27 DIAGNOSIS — Z79890 Hormone replacement therapy (postmenopausal): Secondary | ICD-10-CM | POA: Insufficient documentation

## 2020-05-27 DIAGNOSIS — M1990 Unspecified osteoarthritis, unspecified site: Secondary | ICD-10-CM | POA: Insufficient documentation

## 2020-05-27 DIAGNOSIS — Z8249 Family history of ischemic heart disease and other diseases of the circulatory system: Secondary | ICD-10-CM | POA: Diagnosis not present

## 2020-05-27 DIAGNOSIS — I10 Essential (primary) hypertension: Secondary | ICD-10-CM | POA: Diagnosis not present

## 2020-05-27 LAB — CBC
HCT: 43.2 % (ref 36.0–46.0)
Hemoglobin: 13.5 g/dL (ref 12.0–15.0)
MCH: 28.1 pg (ref 26.0–34.0)
MCHC: 31.3 g/dL (ref 30.0–36.0)
MCV: 89.8 fL (ref 80.0–100.0)
Platelets: 252 10*3/uL (ref 150–400)
RBC: 4.81 MIL/uL (ref 3.87–5.11)
RDW: 14.2 % (ref 11.5–15.5)
WBC: 7 10*3/uL (ref 4.0–10.5)
nRBC: 0 % (ref 0.0–0.2)

## 2020-05-27 LAB — BASIC METABOLIC PANEL
Anion gap: 10 (ref 5–15)
BUN: 17 mg/dL (ref 8–23)
CO2: 25 mmol/L (ref 22–32)
Calcium: 9.5 mg/dL (ref 8.9–10.3)
Chloride: 103 mmol/L (ref 98–111)
Creatinine, Ser: 0.98 mg/dL (ref 0.44–1.00)
GFR calc Af Amer: >60 mL/min (ref >60)
GFR calc non Af Amer: 53 mL/min — ABNORMAL LOW (ref >60)
Glucose, Bld: 156 mg/dL — ABNORMAL HIGH (ref 70–99)
Potassium: 4.1 mmol/L (ref 3.5–5.1)
Sodium: 138 mmol/L (ref 135–145)

## 2020-05-27 NOTE — Progress Notes (Signed)
Due to national recommendations of social distancing due to [place] 19, Audio/video telehealth visit is felt to be most appropriate for this patient at this time.  See MyChart message/consent below  from today for patient consent regarding telehealth for the Atrial Fibrillation Clinic.    Date:  05/27/2020   ID:  Frances [name], DOB April 10, 1934, MRN [id number]  Location: Office  Provider location: [place], [place] [address] Evaluation Performed: f/u persistent afib  PCP:  Lajean Manes, MD  Primary Cardiologist:   Dr. Marlou Porch  Primary Electrophysiologist: Dr. Rayann Heman   CC: persistent afib since last week    History of Present Illness: Frances [name] is a 86 y.o. female who presents  for persistent afib since last week.  Pt with h/o of 2 prior ablation 2017 and most recent 09/2017 that has experienced persistent afib since last week. No known triggers. Her PCP recently increased cardizem for BP issues but she has not started. V rate 129 bpm today. She had several days of afib last May but converted on her own and DCCV was cancelled. Continues on eliquis with a CHA2DS2VASc score of 4.   F/u in afib clinic, 10/11/19, after successful cardioversion.  She remains in SR. She feels improved. Has noted mild ankle edema since increasing cardizem to 300 mg daily. Received her second covid shot this last week.   F/u in afib sl[place] 05/27/20. She went into persistent afib one week ago. No change in health,  although she does tell me she loss her husband in April. Her adult son with Parkinson's is living with her since being dx'ed and his wife asking him for a divorce. She has both vacccines and no missed anticoagulation.   Today, she denies symptoms of palpitations, chest pain, shortness of breath, orthopnea, PND, lower extremity edema, claudication, dizziness, presyncope, syncope, bleeding, or neurologic sequela. The patient is tolerating medications without difficulties and is  otherwise without complaint today.   she denies symptoms of cough, fevers, chills, or new SOB worrisome for COVID 19.    she has a BMI of Body mass index is 28.29 kg/m.Marland Kitchen Filed Weights   05/27/20 1358  Weight: 77.1 kg    Past Medical History:  Diagnosis Date  . Arthritis    hands, back  . Asthmatic bronchitis   . Atrial fibrillation ([place]) 07/31/2013  . Chronic anticoagulation 07/30/2014  . Chronic kidney disease    stage 3 renal disease - no med  . Dizziness 07/30/2014  . Dysrhythmia    Hx - a-fib 05/2010 - tx with meds, no problem since 05/2010  . First degree AV block 07/30/2014  . Hearing loss    bilateral hearing aids  . Hypertension    controlled with meds  . Hypothyroidism   . Internal hemorrhoid   . PAF (paroxysmal atrial fibrillation) ([place]) 11/28/2015  . Paroxysmal atrial fibrillation (H[facility])   . Peripheral vascular disease ([place])    right arm and right shoulder blood clots r/t a fall  . Persistent atrial fibrillation ([place]) 09/28/2017  . Rectal bleeding   . SVD (spontaneous vaginal delivery) 1957, 1959   x 2  . Thyroid disease    hypothyroidism   Past Surgical History:  Procedure Laterality Date  . ABDOMINAL HYSTERECTOMY     and right ovary  . ANTERIOR AND POSTERIOR REPAIR N/A 01/03/2014   Procedure: ANTERIOR (CYSTOCELE) REPAIR;  Surgeon: Floyce Stakes. Pamala Hurry, MD;  Location: [place] [facility];  Service: Gynecology;  Laterality: N/A;  .  APPENDECTOMY    . ATRIAL FIBRILLATION ABLATION N/A 09/28/2017   Procedure: ATRIAL FIBRILLATION ABLATION;  Surgeon: [name], MD;  Location: [place] [facility];  Service: Cardiovascular;  Laterality: N/A;  . BACK SURGERY     x 3 - 2 rods and 8 screws  . CARDIOVERSION N/A 08/24/2017   Procedure: CARDIOVERSION;  Surgeon: Jerline Pain, MD;  Location: [facility] ENDOSCOPY;  Service: Cardiovascular;  Laterality: N/A;  . CARDIOVERSION N/A 10/19/2017   Procedure: CARDIOVERSION;  Surgeon: Josue Hector, MD;  Location: [facility] ENDOSCOPY;  Service:  Cardiovascular;  Laterality: N/A;  . CARDIOVERSION N/A 10/04/2019   Procedure: CARDIOVERSION;  Surgeon: Pixie Casino, MD;  Location: [facility] ENDOSCOPY;  Service: Cardiovascular;  Laterality: N/A;  . COLONOSCOPY    . CYSTOCELE REPAIR    . DENTAL SURGERY     infected tooth - general  . DILATION AND CURETTAGE OF UTERUS     x several  . ELECTROPHYSIOLOGIC STUDY N/A 11/28/2015   Procedure: Atrial Fibrillation Ablation;  Surgeon: [name], MD;  Location: [place] [facility];  Service: Cardiovascular;  Laterality: N/A;  . EP IMPLANTABLE DEVICE N/A 06/08/2016   Procedure: Loop Recorder Insertion;  Surgeon: [name], MD;  Location: [place] [facility];  Service: Cardiovascular;  Laterality: N/A;  . EXAMINATION UNDER ANESTHESIA N/A 01/15/2014   Procedure: EXAM UNDER ANESTHESIA with evacuation of hematoma and over sewing of vaginal mucosa.;  Surgeon: Floyce Stakes. Pamala Hurry, MD;  Location: [place] [facility];  Service: Gynecology;  Laterality: N/A;  . HAND SURGERY     left  . hemorrhoid injection  04/2011  . JOINT REPLACEMENT     left thumb replacement  . MYRINGOTOMY WITH TUBE PLACEMENT Right 10/27/2018  . TEE WITHOUT CARDIOVERSION N/A 09/27/2017   Procedure: TRANSESOPHAGEAL ECHOCARDIOGRAM (TEE);  Surgeon: Sueanne Margarita, MD;  Location[facility] ENDOSCOPY;  Service: Cardiovascular;  Laterality: N/A;  . TONSILLECTOMY       Current Outpatient Medications  Medication Sig Dispense Refill  . acetaminophen (TYLENOL) 500 MG tablet Take 500-1,000 mg by mouth every 6 (six) hours as needed for moderate pain or headache.    . Biotin 5 MG CAPS Take 5 mg by mouth daily with lunch.     . Calcium Carb-Cholecalciferol (CALCIUM 600 + D PO) Take 1 tablet by mouth daily with lunch.    . diltiazem (CARDIZEM CD) 300 MG 24 hr capsule Take 1 capsule (300 mg total) by mouth daily. (Patient taking differently: Take 300 mg by mouth daily before breakfast. ) 90 capsule 3  . diltiazem (CARDIZEM) 30 MG tablet Take 1 tablet (30 mg total) by mouth  every 4 (four) hours as needed (for fast heart rate > 100 as long as BP  > 100). 45 tablet 1  . ELIQUIS 5 MG TABS tablet TAKE 1 TABLET BY MOUTH TWICE A DAY 180 tablet 1  . famotidine (PEPCID) 10 MG tablet Take 10 mg by mouth 2 (two) times daily as needed for heartburn or indigestion.    . hydrochlorothiazide (MICROZIDE) 12.5 MG capsule Take 12.5 mg by mouth daily.    . Inulin (FIBER CHOICE PREBIOTIC FIBER PO) Take 1 tablet by mouth 2 (two) times daily.    Marland Kitchen levothyroxine (SYNTHROID, LEVOTHROID) 75 MCG tablet Take 75 mcg by mouth daily before breakfast.     . PARoxetine (PAXIL) 20 MG tablet Take 20 mg by mouth daily.    . Probiotic Product (PROBIOTIC PO) Take 1 capsule by mouth daily with lunch.    Marland Kitchen  psyllium (METAMUCIL) 58.6 % powder Take 1 packet by mouth 4 (four) times a week.     . ramipril (ALTACE) 10 MG capsule Take 10 mg by mouth 2 (two) times daily.     . sodium chloride (OCEAN) 0.65 % SOLN nasal spray Place 2 sprays into both nostrils as needed for congestion.    . vitamin B-12 (CYANOCOBALAMIN) 1000 MCG tablet Take 1,000 mcg by mouth daily with lunch.     No current facility-administered medications for this encounter.    Allergies:   Pneumovax 23 [pneumococcal vac polyvalent], Codeine, Cephalexin, Ciprofloxacin, Sudafed [pseudoephedrine hcl], and Sulfa antibiotics   Social History:  The patient  reports that she has never smoked. She has never used smokeless tobacco. She reports that she does not drink alcohol and does not use drugs.   Family History:  The patient's  family history includes Diabetes in her father; Heart attack in her mother and sister; Heart disease in her brother, mother, and sister; Heart failure in her brother; Hypertension in her father and sister; Stroke in her father; Sudden death in her maternal grandfather.    ROS:  Please see the history of present illness.   All other systems are personally reviewed and negative.   Exam: GEN- The patient is well  appearing, alert and oriented x 3 today.   Head- normocephalic, atraumatic Eyes-  Sclera clear, conjunctiva pink Ears- hearing intact Oropharynx- clear Neck- supple, no JVP Lymph- no cervical lymphadenopathy Lungs- Clear to ausculation bilaterally, normal work of breathing Heart- regular rate and rhythm, no murmurs, rubs or gallops, PMI not laterally displaced GI- soft, NT, ND, + BS Extremities- no clubbing, cyanosis, or edema MS- no significant deformity or atrophy Skin- no rash or lesion Psych- euthymic mood, full affect Neuro- strength and sensation are intact    EKG: afib with CVRat 98 bpm    ASSESSMENT AND PLAN:  1.Persisitent symptomatic atrial fibrillation Pt had been out of rhythm x 7 days, before that no afib to report  her Linq battery is dead S/p ablation in December 08, 2017, previously on flecainide prior to ablation  Successful cardioversion in  January 2021 Will plan on cardioversion, Tuesday 9/28  If ERAF, may have to discuss adding an AAD  Continue apixaban 5 mg bid, no missed doses for at least 3 weeks Continue Cardizem at  300 mg daily  This patients CHA2DS2-VASc Score and unadjusted Ischemic Stroke Rate (% per year) is equal to 4.8 % stroke rate/year from a score of 4  Above score calculated as 1 point each if present [CHF, HTN, DM, Vascular=MI/PAD/Aortic Plaque, Age if 65-74, or Female] Above score calculated as 2 points each if present [Age > 75, or Stroke/TIA/TE]  COVID screen The patient does not have any symptoms that suggest any further testing/ screening at this time. Has had  both vaccines.   Follow-up: With Dr.Skains, 10/1.   Current medicines are reviewed at length with the patient today.   The patient does not have concerns regarding her medicines.  The following changes were made today:  none  Labs/ tests ordered today include: pre procedure cbc/bmet/ covid screen Orders Placed This Encounter  Procedures  . CBC  . Basic metabolic panel  . EKG  12-Lead    Patient Risk:  after full review of this patients clinical status, I feel that they are at moderate risk at this time.  SignedRoderic Palau NP  05/27/2020 2:47 PM  [facility] [place],  [place] [address] [phone]

## 2020-05-27 NOTE — Patient Instructions (Signed)
Cardioversion scheduled for Tuesday, September 28th  - Arrive at the [facility] and go to admitting at [facility] not eat or drink anything after midnight the night prior to your procedure.  - Take all your morning medication (except diabetic medications) with a sip of water prior to arrival.  - You will not be able to drive home after your procedure.  - Do NOT miss any doses of your blood thinner - if you should miss a dose please notify our office immediately.  - If you feel as if you go back into normal rhythm prior to scheduled cardioversion, please notify our office immediately. If your procedure is canceled in the cardioversion suite you will be charged a cancellation fee.

## 2020-05-28 DIAGNOSIS — Z23 Encounter for immunization: Secondary | ICD-10-CM | POA: Diagnosis not present

## 2020-06-01 ENCOUNTER — Other Ambulatory Visit (HOSPITAL_COMMUNITY)
Admission: RE | Admit: 2020-06-01 | Discharge: 2020-06-01 | Disposition: A | Discharge status: Home / Self Care | Payer: Medicare Other | Source: Ambulatory Visit | Attending: Cardiovascular Disease | Admitting: Cardiovascular Disease

## 2020-06-01 DIAGNOSIS — Z20822 Contact with and (suspected) exposure to covid-19: Secondary | ICD-10-CM | POA: Diagnosis not present

## 2020-06-01 DIAGNOSIS — Z01812 Encounter for preprocedural laboratory examination: Secondary | ICD-10-CM | POA: Diagnosis not present

## 2020-06-01 LAB — SARS CORONAVIRUS 2 (TAT 6-24 HRS): SARS Coronavirus 2: NEGATIVE

## 2020-06-03 ENCOUNTER — Telehealth (HOSPITAL_COMMUNITY): Payer: Self-pay | Admitting: *Deleted

## 2020-06-03 NOTE — Telephone Encounter (Signed)
PT converted back to NSR yesterday afternoon - cardioversion canceled. Pt feeling much improved has scheduled follow up with Dr. Marlou Porch on 10/1.

## 2020-06-04 ENCOUNTER — Ambulatory Visit (HOSPITAL_COMMUNITY): Admission: RE | Admit: 2020-06-04 | Payer: Medicare Other | Source: Home / Self Care | Admitting: Cardiovascular Disease

## 2020-06-04 ENCOUNTER — Encounter (HOSPITAL_COMMUNITY): Admission: RE | Payer: Self-pay | Source: Home / Self Care

## 2020-06-04 SURGERY — CARDIOVERSION
Anesthesia: General

## 2020-06-07 ENCOUNTER — Other Ambulatory Visit: Payer: Self-pay

## 2020-06-07 ENCOUNTER — Ambulatory Visit (INDEPENDENT_AMBULATORY_CARE_PROVIDER_SITE_OTHER): Payer: Medicare Other | Admitting: Cardiology

## 2020-06-07 ENCOUNTER — Encounter: Payer: Self-pay | Admitting: Cardiology

## 2020-06-07 VITALS — BP 112/66 | HR 100 | Ht 65.0 in | Wt 167.0 lb

## 2020-06-07 DIAGNOSIS — I480 Paroxysmal atrial fibrillation: Secondary | ICD-10-CM

## 2020-06-07 DIAGNOSIS — Z7901 Long term (current) use of anticoagulants: Secondary | ICD-10-CM

## 2020-06-07 MED ORDER — DILTIAZEM HCL 30 MG PO TABS
30.0000 mg | ORAL_TABLET | ORAL | 3 refills | Status: DC | PRN
Start: 2020-06-07 — End: 2020-08-29

## 2020-06-07 NOTE — Patient Instructions (Addendum)
Medication Instructions:  The current medical regimen is effective;  continue present plan and medications.  *If you need a refill on your cardiac medications before your next appointment, please call your pharmacy*  Follow-Up: Please schedule an appointment with Dr [name] to re-evaluate your At Fib.  At [facility], you and your health needs are our priority.  As part of our continuing mission to provide you with exceptional heart care, we have created designated Provider Care Teams.  These Care Teams include your primary Cardiologist (physician) and Advanced Practice Providers (APPs -  Physician Assistants and Nurse Practitioners) who all work together to provide you with the care you need, when you need it.  We recommend signing up for the patient portal called "MyChart".  Sign up information is provided on this After Visit Summary.  MyChart is used to connect with patients for Virtual Visits (Telemedicine).  Patients are able to view lab/test results, encounter notes, upcoming appointments, etc.  Non-urgent messages can be sent to your provider as well.   To learn more about what you can do with MyChart, go to [email or web address].    Your next appointment:   3 month(s)  The format for your next appointment:   In Person  Provider:   [name], MD   Thank you for choosing [facility]!!

## 2020-06-07 NOTE — Progress Notes (Signed)
Cardiology Office Note:    Date:  06/07/2020   ID:  Frances [name], DOB July 02, 1934, MRN [id number]  PCP:  Lajean Manes, MD  [facility] Cardiologist:  [name], MD  [facility] Electrophysiologist:  None   Referring MD: Lajean Manes, MD     History of Present Illness:    Frances [name] is a 86 y.o. female here for follow-up of persistent atrial fibrillation.  Had 2 prior ablations in 2017 2019, no known triggers of atrial fibrillation.  Has been seen in the atrial fibrillation clinic last by Roderic Palau, NP on 05/27/2020.  Cardioversion was successful in February 2021 however went back into persistent atrial fibrillation and September 2021.  EKG from 05/27/2020 shows atrial fibrillation heart rate 98 bpm.  Implantable loop recorder battery is dead.  Her apple watch appeared to show sinus rhythm.  70 bpm. Went back to NSR prior to repeat CV. But Wed went back into AFIB.  Clearly she is having episodes of paroxysmal atrial fibrillation currently.  In and out.  Her husband of 82 years died in 01-05-20.  Multiple myeloma.  Her son currently is being treated for Parkinson's.    Past Medical History:  Diagnosis Date  . Arthritis    hands, back  . Asthmatic bronchitis   . Atrial fibrillation ([place]) 07/31/2013  . Chronic anticoagulation 07/30/2014  . Chronic kidney disease    stage 3 renal disease - no med  . Dizziness 07/30/2014  . Dysrhythmia    Hx - a-fib 05/2010 - tx with meds, no problem since 05/2010  . First degree AV block 07/30/2014  . Hearing loss    bilateral hearing aids  . Hypertension    controlled with meds  . Hypothyroidism   . Internal hemorrhoid   . PAF (paroxysmal atrial fibrillation) ([place]) 11/28/2015  . Paroxysmal atrial fibrillation (HCC)   . Peripheral vascular disease ([place])    right arm and right shoulder blood clots r/t a fall  . Persistent atrial fibrillation ([place]) 09/28/2017  . Rectal bleeding   . SVD (spontaneous  vaginal delivery) 1957, 1959   x 2  . Thyroid disease    hypothyroidism    Past Surgical History:  Procedure Laterality Date  . ABDOMINAL HYSTERECTOMY     and right ovary  . ANTERIOR AND POSTERIOR REPAIR N/A 01/03/2014   Procedure: ANTERIOR (CYSTOCELE) REPAIR;  Surgeon: Floyce Stakes. Pamala Hurry, MD;  Location: [facility];  Service: Gynecology;  Laterality: N/A;  . APPENDECTOMY    . ATRIAL FIBRILLATION ABLATION N/A 09/28/2017   Procedure: ATRIAL FIBRILLATION ABLATION;  Surgeon: [name], MD;  Location: [place] [facility];  Service: Cardiovascular;  Laterality: N/A;  . BACK SURGERY     x 3 - 2 rods and 8 screws  . CARDIOVERSION N/A 08/24/2017   Procedure: CARDIOVERSION;  Surgeon: Jerline Pain, MD;  Location: [facility] ENDOSCOPY;  Service: Cardiovascular;  Laterality: N/A;  . CARDIOVERSION N/A 10/19/2017   Procedure: CARDIOVERSION;  Surgeon: Josue Hector, MD;  Location: [facility] ENDOSCOPY;  Service: Cardiovascular;  Laterality: N/A;  . CARDIOVERSION N/A 10/04/2019   Procedure: CARDIOVERSION;  Surgeon: Pixie Casino, MD;  Location: [facility] ENDOSCOPY;  Service: Cardiovascular;  Laterality: N/A;  . COLONOSCOPY    . CYSTOCELE REPAIR    . DENTAL SURGERY     infected tooth - general  . DILATION AND CURETTAGE OF UTERUS     x several  . ELECTROPHYSIOLOGIC STUDY N/A 11/28/2015   Procedure: Atrial Fibrillation Ablation;  Surgeon:  [name], MD;  Location: [place] [facility];  Service: Cardiovascular;  Laterality: N/A;  . EP IMPLANTABLE DEVICE N/A 06/08/2016   Procedure: Loop Recorder Insertion;  Surgeon: [name], MD;  Location: [place] [facility];  Service: Cardiovascular;  Laterality: N/A;  . EXAMINATION UNDER ANESTHESIA N/A 01/15/2014   Procedure: EXAM UNDER ANESTHESIA with evacuation of hematoma and over sewing of vaginal mucosa.;  Surgeon: Floyce Stakes. Pamala Hurry, MD;  Location: [facility];  Service: Gynecology;  Laterality: N/A;  . HAND SURGERY     left  . hemorrhoid injection  04/2011  . JOINT REPLACEMENT      left thumb replacement  . MYRINGOTOMY WITH TUBE PLACEMENT Right 10/27/2018  . TEE WITHOUT CARDIOVERSION N/A 09/27/2017   Procedure: TRANSESOPHAGEAL ECHOCARDIOGRAM (TEE);  Surgeon: Sueanne Margarita, MD;  Location: [facility] ENDOSCOPY;  Service: Cardiovascular;  Laterality: N/A;  . TONSILLECTOMY      Current Medications: Current Meds  Medication Sig  . acetaminophen (TYLENOL) 500 MG tablet Take 500-1,000 mg by mouth every 6 (six) hours as needed for moderate pain or headache.  . Biotin 5 MG CAPS Take 5 mg by mouth daily with lunch. 2500 mcg  . Calcium Carb-Cholecalciferol (CALCIUM 600 + D PO) Take 1 tablet by mouth daily with lunch. 600 mg / 20 mcg  . diltiazem (CARDIZEM CD) 300 MG 24 hr capsule Take 1 capsule (300 mg total) by mouth daily.  Marland Kitchen diltiazem (CARDIZEM) 30 MG tablet Take 1 tablet (30 mg total) by mouth every 4 (four) hours as needed (for fast heart rate > 100 as long as BP  > 100).  Marland Kitchen ELIQUIS 5 MG TABS tablet TAKE 1 TABLET BY MOUTH TWICE A DAY  . famotidine (PEPCID) 10 MG tablet Take 10 mg by mouth 2 (two) times daily as needed for heartburn or indigestion.  . hydrochlorothiazide (MICROZIDE) 12.5 MG capsule Take 12.5 mg by mouth daily.  . Inulin (FIBER CHOICE PREBIOTIC FIBER PO) Take 1 tablet by mouth 2 (two) times daily.  Marland Kitchen levothyroxine (SYNTHROID, LEVOTHROID) 75 MCG tablet Take 75 mcg by mouth daily before breakfast.   . PARoxetine (PAXIL) 20 MG tablet Take 20 mg by mouth daily.  . Probiotic Product (PROBIOTIC PO) Take 1 capsule by mouth daily with lunch.  . psyllium (METAMUCIL) 58.6 % powder Take 1 packet by mouth 4 (four) times a week.   . ramipril (ALTACE) 10 MG capsule Take 10 mg by mouth 2 (two) times daily.   . sodium chloride (OCEAN) 0.65 % SOLN nasal spray Place 2 sprays into both nostrils daily as needed for congestion.   . vitamin B-12 (CYANOCOBALAMIN) 1000 MCG tablet Take 1,000 mcg by mouth daily with lunch.  . [DISCONTINUED] diltiazem (CARDIZEM) 30 MG tablet Take 1 tablet (30  mg total) by mouth every 4 (four) hours as needed (for fast heart rate > 100 as long as BP  > 100).     Allergies:   Pneumovax 23 [pneumococcal vac polyvalent], Codeine, Cephalexin, Ciprofloxacin, Sudafed [pseudoephedrine hcl], and Sulfa antibiotics   Social History   Socioeconomic History  . Marital status: Married    Spouse name: Not on file  . Number of children: Not on file  . Years of education: Not on file  . Highest education level: Not on file  Occupational History  . Not on file  Tobacco Use  . Smoking status: Never Smoker  . Smokeless tobacco: Never Used  Vaping Use  . Vaping Use: Never used  Substance and Sexual Activity  .  Alcohol use: No  . Drug use: No  . Sexual activity: Not Currently    Birth control/protection: Post-menopausal  Other Topics Concern  . Not on file  Social History Narrative  . Not on file   Social Determinants of Health   Financial Resource Strain:   . Difficulty of Paying Living Expenses: Not on file  Food Insecurity:   . Worried About Charity fundraiser in the Last Year: Not on file  . Ran Out of Food in the Last Year: Not on file  Transportation Needs:   . Lack of Transportation (Medical): Not on file  . Lack of Transportation (Non-Medical): Not on file  Physical Activity:   . Days of Exercise per Week: Not on file  . Minutes of Exercise per Session: Not on file  Stress:   . Feeling of Stress : Not on file  Social Connections:   . Frequency of Communication with Friends and Family: Not on file  . Frequency of Social Gatherings with Friends and Family: Not on file  . Attends Religious Services: Not on file  . Active Member of Clubs or Organizations: Not on file  . Attends Archivist Meetings: Not on file  . Marital Status: Not on file     Family History: The patient's family history includes Diabetes in her father; Heart attack in her mother and sister; Heart disease in her brother, mother, and sister; Heart failure  in her brother; Hypertension in her father and sister; Stroke in her father; Sudden death in her maternal grandfather.   Recent Labs: 05/27/2020: BUN 17; Creatinine, Ser 0.98; Hemoglobin 13.5; Platelets 252; Potassium 4.1; Sodium 138  Recent Lipid Panel No results found for: CHOL, TRIG, HDL, CHOLHDL, VLDL, LDLCALC, LDLDIRECT  Physical Exam:    VS:  BP 112/66   Pulse 100   Ht _0  (1.651 m)   Wt 167 lb (75.8 kg)   LMP  (LMP Unknown)   SpO2 97%   BMI 27.79 kg/m     Wt Readings from Last 3 Encounters:  06/07/20 167 lb (75.8 kg)  05/27/20 170 lb (77.1 kg)  10/11/19 173 lb 9.6 oz (78.7 kg)     GEN:  Well nourished, well developed in no acute distress HEENT: Normal NECK: No JVD; No carotid bruits LYMPHATICS: No lymphadenopathy CARDIAC: irreg heart rate 114 no murmurs, rubs, gallops RESPIRATORY:  Clear to auscultation without rales, wheezing or rhonchi  ABDOMEN: Soft, non-tender, non-distended MUSCULOSKELETAL:  No edema; No deformity  SKIN: Warm and dry NEUROLOGIC:  Alert and oriented x 3 PSYCHIATRIC:  Normal affect   ASSESSMENT:    1. PAF (paroxysmal atrial fibrillation) ([place])   2. Chronic anticoagulation    PLAN:    In order of problems listed above:  Paroxysmal atrial fibrillation -Clearly she is still going in and out of atrial fibrillation.  She recognizes that when she stands up and tries to walk around where she becomes symptomatic with her rapid heart rates.  Her apple watch has been very helpful in detecting whether or not she is in sinus rhythm or atrial fibrillation.  She showed me today, clearly atrial fibrillation.  However last Wednesday, was in normal sinus rhythm. -I would like for her to discuss further rhythm control strategies with Dr. Rayann Heman.  2 prior ablations.  Question Tikosyn. -I will go ahead and refill her diltiazem 30 mg as needed.  Chronic anticoagulation -Continue with apixaban.  She is on with hemoglobin of 13.5 creatinine 0.98 TSH 2.8  LDL 63  from outside labs.  Grieving -Husband died in 2022/12/25 multiple myeloma.  Teary-eyed today.     Medication Adjustments/Labs and Tests Ordered: Current medicines are reviewed at length with the patient today.  Concerns regarding medicines are outlined above.  No orders of the defined types were placed in this encounter.  Meds ordered this encounter  Medications  . diltiazem (CARDIZEM) 30 MG tablet    Sig: Take 1 tablet (30 mg total) by mouth every 4 (four) hours as needed (for fast heart rate > 100 as long as BP  > 100).    Dispense:  45 tablet    Refill:  3    Patient Instructions  Medication Instructions:  The current medical regimen is effective;  continue present plan and medications.  *If you need a refill on your cardiac medications before your next appointment, please call your pharmacy*  Follow-Up: Please schedule an appointment with Dr [name] to re-evaluate your At Fib.  At [facility], you and your health needs are our priority.  As part of our continuing mission to provide you with exceptional heart care, we have created designated Provider Care Teams.  These Care Teams include your primary Cardiologist (physician) and Advanced Practice Providers (APPs -  Physician Assistants and Nurse Practitioners) who all work together to provide you with the care you need, when you need it.  We recommend signing up for the patient portal called "MyChart".  Sign up information is provided on this After Visit Summary.  MyChart is used to connect with patients for Virtual Visits (Telemedicine).  Patients are able to view lab/test results, encounter notes, upcoming appointments, etc.  Non-urgent messages can be sent to your provider as well.   To learn more about what you can do with MyChart, go to [email or web address].    Your next appointment:   3 month(s)  The format for your next appointment:   In Person  Provider:   [name], MD   Thank you for choosing [facility]!!        Signed, [name], MD  06/07/2020 12:02 PM    [place]

## 2020-06-17 DIAGNOSIS — E039 Hypothyroidism, unspecified: Secondary | ICD-10-CM | POA: Diagnosis not present

## 2020-06-17 DIAGNOSIS — I129 Hypertensive chronic kidney disease with stage 1 through stage 4 chronic kidney disease, or unspecified chronic kidney disease: Secondary | ICD-10-CM | POA: Diagnosis not present

## 2020-06-17 DIAGNOSIS — I480 Paroxysmal atrial fibrillation: Secondary | ICD-10-CM | POA: Diagnosis not present

## 2020-06-17 DIAGNOSIS — F320 Major depressive disorder, single episode, mild: Secondary | ICD-10-CM | POA: Diagnosis not present

## 2020-06-28 DIAGNOSIS — Z23 Encounter for immunization: Secondary | ICD-10-CM | POA: Diagnosis not present

## 2020-07-01 ENCOUNTER — Encounter: Payer: Self-pay | Admitting: Internal Medicine

## 2020-07-01 ENCOUNTER — Ambulatory Visit (INDEPENDENT_AMBULATORY_CARE_PROVIDER_SITE_OTHER): Payer: Medicare Other | Admitting: Internal Medicine

## 2020-07-01 ENCOUNTER — Other Ambulatory Visit: Payer: Self-pay

## 2020-07-01 VITALS — BP 118/66 | HR 71 | Ht 65.0 in | Wt 168.0 lb

## 2020-07-01 DIAGNOSIS — I10 Essential (primary) hypertension: Secondary | ICD-10-CM | POA: Diagnosis not present

## 2020-07-01 DIAGNOSIS — I4819 Other persistent atrial fibrillation: Secondary | ICD-10-CM

## 2020-07-01 MED ORDER — FLECAINIDE ACETATE 50 MG PO TABS: 50.0000 mg | ORAL_TABLET | Freq: Two times a day (BID) | ORAL | 3 refills | Status: DC

## 2020-07-01 NOTE — Patient Instructions (Addendum)
Medication Instructions:  Your physician recommends that you continue on your current medications as directed. Please refer to the Current Medication list given to you today.  *If you need a refill on your cardiac medications before your next appointment, please call your pharmacy*  Lab Work: None ordered.  If you have labs (blood work) drawn today and your tests are completely normal, you will receive your results only by: Marland Kitchen MyChart Message (if you have MyChart) OR . A paper copy in the mail If you have any lab test that is abnormal or we need to change your treatment, we will call you to review the results.  Testing/Procedures: None ordered.  Follow-Up: At [facility], you and your health needs are our priority.  As part of our continuing mission to provide you with exceptional heart care, we have created designated Provider Care Teams.  These Care Teams include your primary Cardiologist (physician) and Advanced Practice Providers (APPs -  Physician Assistants and Nurse Practitioners) who all work together to provide you with the care you need, when you need it.  We recommend signing up for the patient portal called "MyChart".  Sign up information is provided on this After Visit Summary.  MyChart is used to connect with patients for Virtual Visits (Telemedicine).  Patients are able to view lab/test results, encounter notes, upcoming appointments, etc.  Non-urgent messages can be sent to your provider as well.   To learn more about what you can do with MyChart, go to [email or web address].    Your next appointment:   Your physician wants you to follow-up in: 12/30/20 at 12:15 pm with Dr. Rayann Heman. .  Other Instructions:

## 2020-07-01 NOTE — Progress Notes (Signed)
PCP: Lajean Manes, MD Primary Cardiologist: Dr Marlou Porch Primary EP: Dr Rayann Heman  [name] is a 86 y.o. female who presents today for routine electrophysiology followup.  Since last being seen in our clinic, the patient reports doing very well.  She has had increasing afib.  Episodes are mostly short but occur most days.  + palpitations and dizziness. Today, she denies symptoms of chest pain, shortness of breath,  lower extremity edema,  presyncope, or syncope.  The patient is otherwise without complaint today.   Past Medical History:  Diagnosis Date  . Arthritis    hands, back  . Asthmatic bronchitis   . Atrial fibrillation ([place]) 07/31/2013  . Chronic anticoagulation 07/30/2014  . Chronic kidney disease    stage 3 renal disease - no med  . Dizziness 07/30/2014  . Dysrhythmia    Hx - a-fib 05/2010 - tx with meds, no problem since 05/2010  . First degree AV block 07/30/2014  . Hearing loss    bilateral hearing aids  . Hypertension    controlled with meds  . Hypothyroidism   . Internal hemorrhoid   . PAF (paroxysmal atrial fibrillation) ([place]) 11/28/2015  . Paroxysmal atrial fibrillation (HCC)   . Peripheral vascular disease ([place])    right arm and right shoulder blood clots r/t a fall  . Persistent atrial fibrillation ([place]) 09/28/2017  . Rectal bleeding   . SVD (spontaneous vaginal delivery) 1957, 1959   x 2  . Thyroid disease    hypothyroidism   Past Surgical History:  Procedure Laterality Date  . ABDOMINAL HYSTERECTOMY     and right ovary  . ANTERIOR AND POSTERIOR REPAIR N/A 01/03/2014   Procedure: ANTERIOR (CYSTOCELE) REPAIR;  Surgeon: Floyce Stakes. Pamala Hurry, MD;  Location: [place] [facility];  Service: Gynecology;  Laterality: N/A;  . APPENDECTOMY    . ATRIAL FIBRILLATION ABLATION N/A 09/28/2017   Procedure: ATRIAL FIBRILLATION ABLATION;  Surgeon: [name], MD;  Location: [place] [facility];  Service: Cardiovascular;  Laterality: N/A;  . BACK SURGERY     x 3 - 2 rods and 8  screws  . CARDIOVERSION N/A 08/24/2017   Procedure: CARDIOVERSION;  Surgeon: Jerline Pain, MD;  Location: [facility] ENDOSCOPY;  Service: Cardiovascular;  Laterality: N/A;  . CARDIOVERSION N/A 10/19/2017   Procedure: CARDIOVERSION;  Surgeon: Josue Hector, MD;  Location: [facility] ENDOSCOPY;  Service: Cardiovascular;  Laterality: N/A;  . CARDIOVERSION N/A 10/04/2019   Procedure: CARDIOVERSION;  Surgeon: Pixie Casino, MD;  Location: [facility] ENDOSCOPY;  Service: Cardiovascular;  Laterality: N/A;  . COLONOSCOPY    . CYSTOCELE REPAIR    . DENTAL SURGERY     infected tooth - general  . DILATION AND CURETTAGE OF UTERUS     x several  . ELECTROPHYSIOLOGIC STUDY N/A 11/28/2015   Procedure: Atrial Fibrillation Ablation;  Surgeon: [name], MD;  Location: [place] [facility];  Service: Cardiovascular;  Laterality: N/A;  . EP IMPLANTABLE DEVICE N/A 06/08/2016   Procedure: Loop Recorder Insertion;  Surgeon: [name], MD;  Location: [place] [facility];  Service: Cardiovascular;  Laterality: N/A;  . EXAMINATION UNDER ANESTHESIA N/A 01/15/2014   Procedure: EXAM UNDER ANESTHESIA with evacuation of hematoma and over sewing of vaginal mucosa.;  Surgeon: Floyce Stakes. Pamala Hurry, MD;  Location: [place] [facility];  Service: Gynecology;  Laterality: N/A;  . HAND SURGERY     left  . hemorrhoid injection  04/2011  . JOINT REPLACEMENT     left thumb replacement  . MYRINGOTOMY WITH  TUBE PLACEMENT Right 10/27/2018  . TEE WITHOUT CARDIOVERSION N/A 09/27/2017   Procedure: TRANSESOPHAGEAL ECHOCARDIOGRAM (TEE);  Surgeon: Sueanne Margarita, MD;  Location: [facility] ENDOSCOPY;  Service: Cardiovascular;  Laterality: N/A;  . TONSILLECTOMY      ROS- all systems are reviewed and negatives except as per HPI above  Current Outpatient Medications  Medication Sig Dispense Refill  . acetaminophen (TYLENOL) 500 MG tablet Take 500-1,000 mg by mouth every 6 (six) hours as needed for moderate pain or headache.    . Biotin 5 MG CAPS Take 5 mg by mouth daily with  lunch. 2500 mcg    . Calcium Carb-Cholecalciferol (CALCIUM 600 + D PO) Take 1 tablet by mouth daily with lunch. 600 mg / 20 mcg    . diltiazem (CARDIZEM) 30 MG tablet Take 1 tablet (30 mg total) by mouth every 4 (four) hours as needed (for fast heart rate > 100 as long as BP  > 100). 45 tablet 3  . ELIQUIS 5 MG TABS tablet TAKE 1 TABLET BY MOUTH TWICE A DAY 180 tablet 1  . famotidine (PEPCID) 10 MG tablet Take 10 mg by mouth 2 (two) times daily as needed for heartburn or indigestion.    . hydrochlorothiazide (MICROZIDE) 12.5 MG capsule Take 12.5 mg by mouth daily.    . Inulin (FIBER CHOICE PREBIOTIC FIBER PO) Take 1 tablet by mouth 2 (two) times daily.    Marland Kitchen levothyroxine (SYNTHROID, LEVOTHROID) 75 MCG tablet Take 75 mcg by mouth daily before breakfast.     . PARoxetine (PAXIL) 30 MG tablet Take 30 mg by mouth daily.    . Probiotic Product (PROBIOTIC PO) Take 1 capsule by mouth daily with lunch.    . psyllium (METAMUCIL) 58.6 % powder Take 1 packet by mouth 4 (four) times a week.     . ramipril (ALTACE) 10 MG capsule Take 10 mg by mouth 2 (two) times daily.     . sodium chloride (OCEAN) 0.65 % SOLN nasal spray Place 2 sprays into both nostrils daily as needed for congestion.     . vitamin B-12 (CYANOCOBALAMIN) 1000 MCG tablet Take 1,000 mcg by mouth daily with lunch.    . diltiazem (CARDIZEM CD) 300 MG 24 hr capsule Take 1 capsule (300 mg total) by mouth daily. 90 capsule 3   No current facility-administered medications for this visit.    Physical Exam: Vitals:   07/01/20 1426  BP: 118/66  Pulse: 71  SpO2: 96%  Weight: 168 lb (76.2 kg)  Height: 5\' 5"  (1.651 m)    GEN- The patient is well appearing, alert and oriented x 3 today.   Head- normocephalic, atraumatic Eyes-  Sclera clear, conjunctiva pink Ears- hearing intact Oropharynx- clear Lungs- Clear to ausculation bilaterally, normal work of breathing Heart- Regular rate and rhythm, no murmurs, rubs or gallops, PMI not laterally  displaced GI- soft, NT, ND, + BS Extremities- no clubbing, cyanosis, or edema  Wt Readings from Last 3 Encounters:  07/01/20 168 lb (76.2 kg)  06/07/20 167 lb (75.8 kg)  05/27/20 170 lb (77.1 kg)    EKG tracing ordered today is personally reviewed and shows sinus, LAD  Assessment and Plan:  1. Persistent atrial fibrillation Doing well s/p ablation, though her afib burden is increased chads2vasc score is 4.  She is on eliquis ILR battery is no longer functional.  She uses an Apple Watch to track her rhythm We discussed options including flecainide (previously tolerated), tikosyn, amiodarone and ablation at length today.  Pros and cons to each were discussed.  She would like to revisit flecainide. I will therefore start flecainide 50mg  BID today.  Follow-up in AF clinic in 1 week for an ekg She will contact my office if her afib returns. We discussed taking her ILR out or even replacing.  At this time, she wishes to leave her current device in place and think about this further.  2. HTN Stable No change required today   Risks, benefits and potential toxicities for medications prescribed and/or refilled reviewed with patient today.   AF clinic in 1 week and 3 months I will see in 6 months  [name] MD, [facility] 07/01/2020 2:32 PM

## 2020-07-08 ENCOUNTER — Ambulatory Visit (HOSPITAL_COMMUNITY)
Admission: RE | Admit: 2020-07-08 | Discharge: 2020-07-08 | Disposition: A | Discharge status: Home / Self Care | Payer: Medicare Other | Source: Ambulatory Visit | Attending: Nurse Practitioner | Admitting: Nurse Practitioner

## 2020-07-08 ENCOUNTER — Other Ambulatory Visit: Payer: Self-pay

## 2020-07-08 VITALS — BP 130/68 | HR 68

## 2020-07-08 DIAGNOSIS — Z8249 Family history of ischemic heart disease and other diseases of the circulatory system: Secondary | ICD-10-CM | POA: Insufficient documentation

## 2020-07-08 DIAGNOSIS — I4819 Other persistent atrial fibrillation: Secondary | ICD-10-CM | POA: Diagnosis not present

## 2020-07-08 DIAGNOSIS — Z881 Allergy status to other antibiotic agents status: Secondary | ICD-10-CM | POA: Diagnosis not present

## 2020-07-08 DIAGNOSIS — Z79890 Hormone replacement therapy (postmenopausal): Secondary | ICD-10-CM | POA: Insufficient documentation

## 2020-07-08 DIAGNOSIS — Z887 Allergy status to serum and vaccine status: Secondary | ICD-10-CM | POA: Insufficient documentation

## 2020-07-08 DIAGNOSIS — Z79899 Other long term (current) drug therapy: Secondary | ICD-10-CM | POA: Insufficient documentation

## 2020-07-08 DIAGNOSIS — Z888 Allergy status to other drugs, medicaments and biological substances status: Secondary | ICD-10-CM | POA: Insufficient documentation

## 2020-07-08 DIAGNOSIS — Z885 Allergy status to narcotic agent status: Secondary | ICD-10-CM | POA: Diagnosis not present

## 2020-07-08 DIAGNOSIS — D6869 Other thrombophilia: Secondary | ICD-10-CM

## 2020-07-08 DIAGNOSIS — Z7901 Long term (current) use of anticoagulants: Secondary | ICD-10-CM | POA: Insufficient documentation

## 2020-07-08 DIAGNOSIS — Z882 Allergy status to sulfonamides status: Secondary | ICD-10-CM | POA: Insufficient documentation

## 2020-07-08 NOTE — Progress Notes (Signed)
Due to national recommendations of social distancing due to [place] 19, Audio/video telehealth visit is felt to be most appropriate for this patient at this time.  See MyChart message/consent below  from today for patient consent regarding telehealth for the [facility].    Date:  07/08/2020   ID:  Frances [name], DOB 1934-08-04, MRN [id number]  Location: Office  Provider location: [place], [place] [address] Evaluation Performed: f/u persistent afib  PCP:  Lajean Manes, MD  Primary Cardiologist:   Dr. Marlou Porch  Primary Electrophysiologist: Dr. Rayann Heman   CC: persistent afib since last week    History of Present Illness: Frances [name] is a 86 y.o. female who presents  for persistent afib since last week.  Pt with h/o of 2 prior ablation 2017 and most recent 09/2017 that has experienced persistent afib since last week. No known triggers. Her PCP recently increased cardizem for BP issues but she has not started. V rate 129 bpm today. She had several days of afib last May but converted on her own and DCCV was cancelled. Continues on eliquis with a CHA2DS2VASc score of 4.   F/u in afib clinic, 10/11/19, after successful cardioversion.  She remains in SR. She feels improved. Has noted mild ankle edema since increasing cardizem to 300 mg daily. Received her second covid shot this last week.   F/u in afib [facility] 05/27/20. She went into persistent afib one week ago. No change in health,  although she does tell me she loss her husband in April. Her adult son with Parkinson's is living with her since being dx'ed and his wife asking him for a divorce. She has both vacccines and no missed anticoagulation.   F/u in afib clinic 07/08/20. She spontaneously reverted to SR so cardioversion was cancelled. She then had f/u with Dr. Marlou Porch  and was in afib. She was scheduled an appointment with Dr. Rayann Heman and he gave her several options but she wanted to revisit flecainide  and was started on 50 mg bid. She is now in the office and is in SR with flecainide 50 mg bid.. She has a first degree block on flecainide similar  to EKG's on flecainide in the past. She feels well. No afib since start of flecainide.   Today, she denies symptoms of palpitations, chest pain, shortness of breath, orthopnea, PND, lower extremity edema, claudication, dizziness, presyncope, syncope, bleeding, or neurologic sequela. The patient is tolerating medications without difficulties and is otherwise without complaint today.   she denies symptoms of cough, fevers, chills, or new SOB worrisome for COVID 19.    she has a BMI of There is no height or weight on file to calculate BMI.. There were no vitals filed for this visit.  Past Medical History:  Diagnosis Date  . Arthritis    hands, back  . Asthmatic bronchitis   . Atrial fibrillation ([place]) 07/31/2013  . Chronic anticoagulation 07/30/2014  . Chronic kidney disease    stage 3 renal disease - no med  . Dizziness 07/30/2014  . Dysrhythmia    Hx - a-fib 05/2010 - tx with meds, no problem since 05/2010  . First degree AV block 07/30/2014  . Hearing loss    bilateral hearing aids  . Hypertension    controlled with meds  . Hypothyroidism   . Internal hemorrhoid   . PAF (paroxysmal atrial fibrillation) ([place]) 11/28/2015  . Paroxysmal atrial fibrillation ([facility])   . Peripheral vascular  disease ([place])    right arm and right shoulder blood clots r/t a fall  . Persistent atrial fibrillation ([facility]) 09/28/2017  . Rectal bleeding   . SVD (spontaneous vaginal delivery) 1957, 1959   x 2  . Thyroid disease    hypothyroidism   Past Surgical History:  Procedure Laterality Date  . ABDOMINAL HYSTERECTOMY     and right ovary  . ANTERIOR AND POSTERIOR REPAIR N/A 01/03/2014   Procedure: ANTERIOR (CYSTOCELE) REPAIR;  Surgeon: Floyce Stakes. Pamala Hurry, MD;  Location: [facility];  Service: Gynecology;  Laterality: N/A;  . APPENDECTOMY    . ATRIAL FIBRILLATION  ABLATION N/A 09/28/2017   Procedure: ATRIAL FIBRILLATION ABLATION;  Surgeon: [name], MD;  Location: [facility];  Service: Cardiovascular;  Laterality: N/A;  . BACK SURGERY     x 3 - 2 rods and 8 screws  . CARDIOVERSION N/A 08/24/2017   Procedure: CARDIOVERSION;  Surgeon: Jerline Pain, MD;  Location: [facility] ENDOSCOPY;  Service: Cardiovascular;  Laterality: N/A;  . CARDIOVERSION N/A 10/19/2017   Procedure: CARDIOVERSION;  Surgeon: Josue Hector, MD;  Location: [facility] ENDOSCOPY;  Service: Cardiovascular;  Laterality: N/A;  . CARDIOVERSION N/A 10/04/2019   Procedure: CARDIOVERSION;  Surgeon: Pixie Casino, MD;  Location: [facility] ENDOSCOPY;  Service: Cardiovascular;  Laterality: N/A;  . COLONOSCOPY    . CYSTOCELE REPAIR    . DENTAL SURGERY     infected tooth - general  . DILATION AND CURETTAGE OF UTERUS     x several  . ELECTROPHYSIOLOGIC STUDY N/A 11/28/2015   Procedure: Atrial Fibrillation Ablation;  Surgeon: [name], MD;  Location: [facility];  Service: Cardiovascular;  Laterality: N/A;  . EP IMPLANTABLE DEVICE N/A 06/08/2016   Procedure: Loop Recorder Insertion;  Surgeon: [name], MD;  Location: [facility];  Service: Cardiovascular;  Laterality: N/A;  . EXAMINATION UNDER ANESTHESIA N/A 01/15/2014   Procedure: EXAM UNDER ANESTHESIA with evacuation of hematoma and over sewing of vaginal mucosa.;  Surgeon: Floyce Stakes. Pamala Hurry, MD;  Location: [facility];  Service: Gynecology;  Laterality: N/A;  . HAND SURGERY     left  . hemorrhoid injection  04/2011  . JOINT REPLACEMENT     left thumb replacement  . MYRINGOTOMY WITH TUBE PLACEMENT Right 10/27/2018  . TEE WITHOUT CARDIOVERSION N/A 09/27/2017   Procedure: TRANSESOPHAGEAL ECHOCARDIOGRAM (TEE);  Surgeon: Sueanne Margarita, MD;  Location: [facility] ENDOSCOPY;  Service: Cardiovascular;  Laterality: N/A;  . TONSILLECTOMY       Current Outpatient Medications  Medication Sig Dispense Refill  . acetaminophen (TYLENOL) 500 MG tablet  Take 500-1,000 mg by mouth every 6 (six) hours as needed for moderate pain or headache.    . Biotin 5 MG CAPS Take 5 mg by mouth daily with lunch. 2500 mcg    . Calcium Carb-Cholecalciferol (CALCIUM 600 + D PO) Take 1 tablet by mouth daily with lunch. 600 mg / 20 mcg    . diltiazem (CARDIZEM CD) 300 MG 24 hr capsule Take 1 capsule (300 mg total) by mouth daily. 90 capsule 3  . diltiazem (CARDIZEM) 30 MG tablet Take 1 tablet (30 mg total) by mouth every 4 (four) hours as needed (for fast heart rate > 100 as long as BP  > 100). 45 tablet 3  . ELIQUIS 5 MG TABS tablet TAKE 1 TABLET BY MOUTH TWICE A DAY 180 tablet 1  . famotidine (PEPCID) 10 MG tablet Take 10 mg by mouth 2 (two) times daily as  needed for heartburn or indigestion.    . flecainide (TAMBOCOR) 50 MG tablet Take 1 tablet (50 mg total) by mouth 2 (two) times daily. 180 tablet 3  . hydrochlorothiazide (MICROZIDE) 12.5 MG capsule Take 12.5 mg by mouth daily.    . Inulin (FIBER CHOICE PREBIOTIC FIBER PO) Take 1 tablet by mouth 2 (two) times daily.    Marland Kitchen levothyroxine (SYNTHROID, LEVOTHROID) 75 MCG tablet Take 75 mcg by mouth daily before breakfast.     . PARoxetine (PAXIL) 30 MG tablet Take 30 mg by mouth daily.    . Probiotic Product (PROBIOTIC PO) Take 1 capsule by mouth daily with lunch.    . psyllium (METAMUCIL) 58.6 % powder Take 1 packet by mouth 4 (four) times a week.     . ramipril (ALTACE) 10 MG capsule Take 10 mg by mouth 2 (two) times daily.     . sodium chloride (OCEAN) 0.65 % SOLN nasal spray Place 2 sprays into both nostrils daily as needed for congestion.     . vitamin B-12 (CYANOCOBALAMIN) 1000 MCG tablet Take 1,000 mcg by mouth daily with lunch.     No current facility-administered medications for this encounter.    Allergies:   Pneumovax 23 [pneumococcal vac polyvalent], Codeine, Cephalexin, Ciprofloxacin, Sudafed [pseudoephedrine hcl], and Sulfa antibiotics   Social History:  The patient  reports that she has never  smoked. She has never used smokeless tobacco. She reports that she does not drink alcohol and does not use drugs.   Family History:  The patient's  family history includes Diabetes in her father; Heart attack in her mother and sister; Heart disease in her brother, mother, and sister; Heart failure in her brother; Hypertension in her father and sister; Stroke in her father; Sudden death in her maternal grandfather.    ROS:  Please see the history of present illness.   All other systems are personally reviewed and negative.   Exam: GEN- The patient is well appearing, alert and oriented x 3 today.   Head- normocephalic, atraumatic Eyes-  Sclera clear, conjunctiva pink Ears- hearing intact Oropharynx- clear Neck- supple, no JVP Lymph- no cervical lymphadenopathy Lungs- Clear to ausculation bilaterally, normal work of breathing Heart- regular rate and rhythm, no murmurs, rubs or gallops, PMI not laterally displaced GI- soft, NT, ND, + BS Extremities- no clubbing, cyanosis, or edema MS- no significant deformity or atrophy Skin- no rash or lesion Psych- euthymic mood, full affect Neuro- strength and sensation are intact    EKG: Sinus rhythm at 68 bpm, with first degree AVB, pr int 248 ms, qrs int 90 ms, qtc 444 ms( first degree Block with flecainide is similar to ekg's in the past when pt was on drug)   ASSESSMENT AND PLAN:  1.Persisitent symptomatic atrial fibrillation Recent increase in afib burden  Pt is now back on flecainide 50 mg bid  for increased afib burden after consult with Dr. Rayann Heman  S/p ablation  in 2019, previously on flecainide prior to ablation  Doing well back on drug without any afib Continue apixaban 5 mg bid  Continue Cardizem at  300 mg daily  This patients CHA2DS2-VASc Score and unadjusted Ischemic Stroke Rate (% per year) is equal to 4.8 % stroke rate/year from a score of 4  Above score calculated as 1 point each if present [CHF, HTN, DM,  Vascular=MI/PAD/Aortic Plaque, Age if 65-74, or Female] Above score calculated as 2 points each if present [Age > 75, or Stroke/TIA/TE]  COVID screen The  patient does not have any symptoms that suggest any further testing/ screening at this time. Has had  both vaccines.   Follow-up:  In 3 months in afib clinic, Dr. Rayann Heman will see in 6 months  Current medicines are reviewed at length with the patient today.   The patient does not have concerns regarding her medicines.  The following changes were made today:  none  Labs/ tests ordered today include: pre procedure cbc/bmet/ covid screen Orders Placed This Encounter  Procedures  . EKG 12-Lead      Signed, Roderic Palau NP  07/08/2020 3:13 PM  [facility] [address] [place], [place] [address] [phone]

## 2020-07-16 DIAGNOSIS — I480 Paroxysmal atrial fibrillation: Secondary | ICD-10-CM | POA: Diagnosis not present

## 2020-07-16 DIAGNOSIS — I129 Hypertensive chronic kidney disease with stage 1 through stage 4 chronic kidney disease, or unspecified chronic kidney disease: Secondary | ICD-10-CM | POA: Diagnosis not present

## 2020-07-16 DIAGNOSIS — E039 Hypothyroidism, unspecified: Secondary | ICD-10-CM | POA: Diagnosis not present

## 2020-07-16 DIAGNOSIS — F320 Major depressive disorder, single episode, mild: Secondary | ICD-10-CM | POA: Diagnosis not present

## 2020-07-18 DIAGNOSIS — Z23 Encounter for immunization: Secondary | ICD-10-CM | POA: Diagnosis not present

## 2020-07-18 DIAGNOSIS — I480 Paroxysmal atrial fibrillation: Secondary | ICD-10-CM | POA: Diagnosis not present

## 2020-07-18 DIAGNOSIS — Z0000 Encounter for general adult medical examination without abnormal findings: Secondary | ICD-10-CM | POA: Diagnosis not present

## 2020-07-18 DIAGNOSIS — R300 Dysuria: Secondary | ICD-10-CM | POA: Diagnosis not present

## 2020-07-18 DIAGNOSIS — E039 Hypothyroidism, unspecified: Secondary | ICD-10-CM | POA: Diagnosis not present

## 2020-07-18 DIAGNOSIS — Z79899 Other long term (current) drug therapy: Secondary | ICD-10-CM | POA: Diagnosis not present

## 2020-07-18 DIAGNOSIS — I700 Atherosclerosis of aorta: Secondary | ICD-10-CM | POA: Diagnosis not present

## 2020-07-18 DIAGNOSIS — I129 Hypertensive chronic kidney disease with stage 1 through stage 4 chronic kidney disease, or unspecified chronic kidney disease: Secondary | ICD-10-CM | POA: Diagnosis not present

## 2020-07-18 DIAGNOSIS — Z1389 Encounter for screening for other disorder: Secondary | ICD-10-CM | POA: Diagnosis not present

## 2020-08-06 ENCOUNTER — Other Ambulatory Visit (HOSPITAL_COMMUNITY): Payer: Self-pay | Admitting: Nurse Practitioner

## 2020-08-16 DIAGNOSIS — L821 Other seborrheic keratosis: Secondary | ICD-10-CM | POA: Diagnosis not present

## 2020-08-16 DIAGNOSIS — L570 Actinic keratosis: Secondary | ICD-10-CM | POA: Diagnosis not present

## 2020-08-16 DIAGNOSIS — D225 Melanocytic nevi of trunk: Secondary | ICD-10-CM | POA: Diagnosis not present

## 2020-08-16 DIAGNOSIS — L578 Other skin changes due to chronic exposure to nonionizing radiation: Secondary | ICD-10-CM | POA: Diagnosis not present

## 2020-08-16 DIAGNOSIS — X32XXXD Exposure to sunlight, subsequent encounter: Secondary | ICD-10-CM | POA: Diagnosis not present

## 2020-08-21 DIAGNOSIS — I129 Hypertensive chronic kidney disease with stage 1 through stage 4 chronic kidney disease, or unspecified chronic kidney disease: Secondary | ICD-10-CM | POA: Diagnosis not present

## 2020-08-21 DIAGNOSIS — F320 Major depressive disorder, single episode, mild: Secondary | ICD-10-CM | POA: Diagnosis not present

## 2020-08-21 DIAGNOSIS — E039 Hypothyroidism, unspecified: Secondary | ICD-10-CM | POA: Diagnosis not present

## 2020-08-21 DIAGNOSIS — I480 Paroxysmal atrial fibrillation: Secondary | ICD-10-CM | POA: Diagnosis not present

## 2020-08-29 ENCOUNTER — Other Ambulatory Visit: Payer: Self-pay | Admitting: Cardiology

## 2020-09-12 ENCOUNTER — Other Ambulatory Visit: Payer: Self-pay

## 2020-09-12 ENCOUNTER — Encounter: Payer: Self-pay | Admitting: Cardiology

## 2020-09-12 ENCOUNTER — Ambulatory Visit (INDEPENDENT_AMBULATORY_CARE_PROVIDER_SITE_OTHER): Payer: Medicare Other | Admitting: Cardiology

## 2020-09-12 VITALS — BP 132/72 | HR 61 | Ht 64.75 in | Wt 171.6 lb

## 2020-09-12 DIAGNOSIS — I2510 Atherosclerotic heart disease of native coronary artery without angina pectoris: Secondary | ICD-10-CM

## 2020-09-12 DIAGNOSIS — I480 Paroxysmal atrial fibrillation: Secondary | ICD-10-CM

## 2020-09-12 DIAGNOSIS — Z79899 Other long term (current) drug therapy: Secondary | ICD-10-CM

## 2020-09-12 MED ORDER — ROSUVASTATIN CALCIUM 10 MG PO TABS: 10.0000 mg | ORAL_TABLET | Freq: Every day | ORAL | 3 refills | Status: DC

## 2020-09-12 NOTE — Patient Instructions (Addendum)
Medication Instructions:  Please start Crestor 10 mg a day.  Continue all other medications as listed.  *If you need a refill on your cardiac medications before your next appointment, please call your pharmacy*  Lab Work: Please have blood work in 3 months (Lipid/ALT)  If you have labs (blood work) drawn today and your tests are completely normal, you will receive your results only by: Marland Kitchen MyChart Message (if you have MyChart) OR . A paper copy in the mail If you have any lab test that is abnormal or we need to change your treatment, we will call you to review the results.  Follow-Up: At [facility], you and your health needs are our priority.  As part of our continuing mission to provide you with exceptional heart care, we have created designated Provider Care Teams.  These Care Teams include your primary Cardiologist (physician) and Advanced Practice Providers (APPs -  Physician Assistants and Nurse Practitioners) who all work together to provide you with the care you need, when you need it.  We recommend signing up for the patient portal called "MyChart".  Sign up information is provided on this After Visit Summary.  MyChart is used to connect with patients for Virtual Visits (Telemedicine).  Patients are able to view lab/test results, encounter notes, upcoming appointments, etc.  Non-urgent messages can be sent to your provider as well.   To learn more about what you can do with MyChart, go to [email or web address].    Your next appointment:   3 month(s)  The format for your next appointment:   In Person  Provider:   Donato Schultz, MD  Thank you for choosing [facility]!!

## 2020-09-12 NOTE — Progress Notes (Signed)
Cardiology Office Note:    Date:  09/12/2020   ID:  Frances [name], DOB Mar 06, 1934, MRN [id number]  PCP:  Lajean Manes, MD  [facility] Cardiologist:  [name], MD  [name] Electrophysiologist:  None   Referring MD: Lajean Manes, MD     History of Present Illness:    Frances [name] is a 87 y.o. female here for follow-up atrial fibrillation.  Saw Dr. Rayann Heman.  Reviewed his note from 07/01/2020.  Episodes short-lived palpitations.  Dizziness.  She is on Eliquis.  Implantable loop recorder battery is no longer functional.  Apple watch used to help track her rhythm. Flecainide was discussed, previously tolerated.  Tikosyn amiodarone and ablation at length pros and cons to each.  She was restarted on flecainide.  50 mg twice a day.  She saw Roderic Palau in the A. fib clinic for EKG follow-up.  QRS 90.  Husband died 9 mths ago with mueloma.  Mother died MI 60 Brother died during CABG 58  Overall been doing fairly well.  Still grieving the loss of her husband.  Will feel occasional racing heartbeat fleeting.  Sometimes feel dizzy with this again.3  Past Medical History:  Diagnosis Date  . Arthritis    hands, back  . Asthmatic bronchitis   . Atrial fibrillation ([place]) 07/31/2013  . Chronic anticoagulation 07/30/2014  . Chronic kidney disease    stage 3 renal disease - no med  . Dizziness 07/30/2014  . Dysrhythmia    Hx - a-fib 05/2010 - tx with meds, no problem since 05/2010  . First degree AV block 07/30/2014  . Hearing loss    bilateral hearing aids  . Hypertension    controlled with meds  . Hypothyroidism   . Internal hemorrhoid   . PAF (paroxysmal atrial fibrillation) ([place]) 11/28/2015  . Paroxysmal atrial fibrillation (HCC)   . Peripheral vascular disease ([place])    right arm and right shoulder blood clots r/t a fall  . Persistent atrial fibrillation ([place]) 09/28/2017  . Rectal bleeding   . SVD (spontaneous vaginal delivery) 1957, 1959   x 2  . Thyroid  disease    hypothyroidism    Past Surgical History:  Procedure Laterality Date  . ABDOMINAL HYSTERECTOMY     and right ovary  . ANTERIOR AND POSTERIOR REPAIR N/A 01/03/2014   Procedure: ANTERIOR (CYSTOCELE) REPAIR;  Surgeon: Floyce Stakes. Pamala Hurry, MD;  Location: [place] [facility];  Service: Gynecology;  Laterality: N/A;  . APPENDECTOMY    . ATRIAL FIBRILLATION ABLATION N/A 09/28/2017   Procedure: ATRIAL FIBRILLATION ABLATION;  Surgeon: [name], MD;  Location: [place] [facility];  Service: Cardiovascular;  Laterality: N/A;  . BACK SURGERY     x 3 - 2 rods and 8 screws  . CARDIOVERSION N/A 08/24/2017   Procedure: CARDIOVERSION;  Surgeon: Jerline Pain, MD;  Location: [facility] ENDOSCOPY;  Service: Cardiovascular;  Laterality: N/A;  . CARDIOVERSION N/A 10/19/2017   Procedure: CARDIOVERSION;  Surgeon: Josue Hector, MD;  Location: [facility] ENDOSCOPY;  Service: Cardiovascular;  Laterality: N/A;  . CARDIOVERSION N/A 10/04/2019   Procedure: CARDIOVERSION;  Surgeon: Pixie Casino, MD;  Location: [facility] ENDOSCOPY;  Service: Cardiovascular;  Laterality: N/A;  . COLONOSCOPY    . CYSTOCELE REPAIR    . DENTAL SURGERY     infected tooth - general  . DILATION AND CURETTAGE OF UTERUS     x several  . ELECTROPHYSIOLOGIC STUDY N/A 11/28/2015   Procedure: Atrial Fibrillation Ablation;  Surgeon: [name],  MD;  Location: [place] [facility];  Service: Cardiovascular;  Laterality: N/A;  . EP IMPLANTABLE DEVICE N/A 06/08/2016   Procedure: Loop Recorder Insertion;  Surgeon: [name], MD;  Location: [place] [facility];  Service: Cardiovascular;  Laterality: N/A;  . EXAMINATION UNDER ANESTHESIA N/A 01/15/2014   Procedure: EXAM UNDER ANESTHESIA with evacuation of hematoma and over sewing of vaginal mucosa.;  Surgeon: Floyce Stakes. Pamala Hurry, MD;  Location: [place] [facility];  Service: Gynecology;  Laterality: N/A;  . HAND SURGERY     left  . hemorrhoid injection  04/2011  . JOINT REPLACEMENT     left thumb replacement  . MYRINGOTOMY WITH TUBE  PLACEMENT Right 10/27/2018  . TEE WITHOUT CARDIOVERSION N/A 09/27/2017   Procedure: TRANSESOPHAGEAL ECHOCARDIOGRAM (TEE);  Surgeon: Sueanne Margarita, MD;  Location: [facility] ENDOSCOPY;  Service: Cardiovascular;  Laterality: N/A;  . TONSILLECTOMY      Current Medications: Current Meds  Medication Sig  . acetaminophen (TYLENOL) 500 MG tablet Take 500-1,000 mg by mouth every 6 (six) hours as needed for moderate pain or headache.  . Biotin 5 MG CAPS Take 5 mg by mouth daily with lunch. 2500 mcg  . Calcium Carb-Cholecalciferol (CALCIUM 600 + D PO) Take 1 tablet by mouth daily with lunch. 600 mg / 20 mcg  . diltiazem (CARDIZEM) 30 MG tablet TAKE 1 TABLET (30 MG TOTAL) BY MOUTH EVERY 4 (FOUR) HOURS AS NEEDED (FOR FAST HEART RATE > 100 AS LONG AS BP > 100).  Marland Kitchen ELIQUIS 5 MG TABS tablet TAKE 1 TABLET BY MOUTH TWICE A DAY  . famotidine (PEPCID) 10 MG tablet Take 10 mg by mouth 2 (two) times daily as needed for heartburn or indigestion.  . flecainide (TAMBOCOR) 50 MG tablet Take 1 tablet (50 mg total) by mouth 2 (two) times daily.  . hydrochlorothiazide (MICROZIDE) 12.5 MG capsule Take 12.5 mg by mouth daily.  . Inulin (FIBER CHOICE PREBIOTIC FIBER PO) Take 1 tablet by mouth 2 (two) times daily.  Marland Kitchen levothyroxine (SYNTHROID, LEVOTHROID) 75 MCG tablet Take 75 mcg by mouth daily before breakfast.  . PARoxetine (PAXIL) 30 MG tablet Take 30 mg by mouth daily.  . Probiotic Product (PROBIOTIC PO) Take 1 capsule by mouth daily with lunch.  . psyllium (METAMUCIL) 58.6 % powder Take 1 packet by mouth 4 (four) times a week.   . ramipril (ALTACE) 10 MG capsule Take 10 mg by mouth 2 (two) times daily.   . rosuvastatin (CRESTOR) 10 MG tablet Take 1 tablet (10 mg total) by mouth daily.  . sodium chloride (OCEAN) 0.65 % SOLN nasal spray Place 2 sprays into both nostrils daily as needed for congestion.   . vitamin B-12 (CYANOCOBALAMIN) 1000 MCG tablet Take 1,000 mcg by mouth daily with lunch.     Allergies:   Pneumovax 23  [pneumococcal vac polyvalent], Codeine, Cephalexin, Ciprofloxacin, Sudafed [pseudoephedrine hcl], and Sulfa antibiotics   Social History   Socioeconomic History  . Marital status: Married    Spouse name: Not on file  . Number of children: Not on file  . Years of education: Not on file  . Highest education level: Not on file  Occupational History  . Not on file  Tobacco Use  . Smoking status: Never Smoker  . Smokeless tobacco: Never Used  Vaping Use  . Vaping Use: Never used  Substance and Sexual Activity  . Alcohol use: No  . Drug use: No  . Sexual activity: Not Currently    Birth control/protection: Post-menopausal  Other Topics Concern  . Not on file  Social History Narrative  . Not on file   Social Determinants of Health   Financial Resource Strain: Not on file  Food Insecurity: Not on file  Transportation Needs: Not on file  Physical Activity: Not on file  Stress: Not on file  Social Connections: Not on file     Family History: The patient's family history includes Diabetes in her father; Heart attack in her mother and sister; Heart disease in her brother, mother, and sister; Heart failure in her brother; Hypertension in her father and sister; Stroke in her father; Sudden death in her maternal grandfather.  ROS:   Please see the history of present illness.    No syncope no bleeding. all other systems reviewed and are negative.  EKGs/Labs/Other Studies Reviewed:    The following studies were reviewed today: Chest CT was personally reviewed and interpreted from 2019.  Dense coronary artery calcification noted.  Especially LAD distribution.  Aortic atherosclerosis noted as well.  2017: Ca score: Calcium score of 777 what represents 91 percentile for age and sex. Normal coronary origin. Right dominance  Recent Labs: 05/27/2020: BUN 17; Creatinine, Ser 0.98; Hemoglobin 13.5; Platelets 252; Potassium 4.1; Sodium 138  Recent Lipid Panel No results found for: CHOL,  TRIG, HDL, CHOLHDL, VLDL, LDLCALC, LDLDIRECT   Physical Exam:    VS:  BP 132/72   Pulse 61   Ht 5' 4.75" (1.645 m)   Wt 171 lb 9.6 oz (77.8 kg)   LMP  (LMP Unknown)   SpO2 98%   BMI 28.78 kg/m     Wt Readings from Last 3 Encounters:  09/12/20 171 lb 9.6 oz (77.8 kg)  07/01/20 168 lb (76.2 kg)  06/07/20 167 lb (75.8 kg)     GEN:  Well nourished, well developed in no acute distress HEENT: Normal NECK: No JVD; No carotid bruits LYMPHATICS: No lymphadenopathy CARDIAC: RRR, no murmurs, rubs, gallops RESPIRATORY:  Clear to auscultation without rales, wheezing or rhonchi  ABDOMEN: Soft, non-tender, non-distended MUSCULOSKELETAL:  No edema; No deformity  SKIN: Warm and dry NEUROLOGIC:  Alert and oriented x 3 PSYCHIATRIC:  Normal affect   ASSESSMENT:    1. Medication management   2. Coronary artery disease involving native coronary artery of native heart without angina pectoris   3. PAF (paroxysmal atrial fibrillation) (HCC)    PLAN:    In order of problems listed above:  Coronary artery disease/aortic atherosclerosis -Once again reviewed her prior CT scan, dense LAD calcification -I would like for her to be on Crestor at least 10 mg a day for added protection, plaque stabilization.  Choosing this dose based upon her age. -In 3 months we will recheck lipid panel and ALT.  We will see her back in clinic to make sure she is doing well with this.  Paroxysmal atrial fibrillation -Overall seems to be doing fairly well with the flecainide 50 mg twice a day.  Rare episodes of palpitations.  QRS 90. -Has an apple watch.  Chronic anticoagulation -On Eliquis.  No aspirin because of Eliquis.  Hemoglobin 13.3 creatinine 1.04  Still grieving the loss of her husband who died from multiple myeloma in 01-23-20     Medication Adjustments/Labs and Tests Ordered: Current medicines are reviewed at length with the patient today.  Concerns regarding medicines are outlined above.   Orders Placed This Encounter  Procedures  . ALT  . Lipid panel   Meds ordered this encounter  Medications  .  rosuvastatin (CRESTOR) 10 MG tablet    Sig: Take 1 tablet (10 mg total) by mouth daily.    Dispense:  90 tablet    Refill:  3    Patient Instructions  Medication Instructions:  Please start Crestor 10 mg a day.  Continue all other medications as listed.  *If you need a refill on your cardiac medications before your next appointment, please call your pharmacy*  Lab Work: Please have blood work in 3 months (Lipid/ALT)  If you have labs (blood work) drawn today and your tests are completely normal, you will receive your results only by: Marland Kitchen MyChart Message (if you have MyChart) OR . A paper copy in the mail If you have any lab test that is abnormal or we need to change your treatment, we will call you to review the results.  Follow-Up: At [facility], you and your health needs are our priority.  As part of our continuing mission to provide you with exceptional heart care, we have created designated Provider Care Teams.  These Care Teams include your primary Cardiologist (physician) and Advanced Practice Providers (APPs -  Physician Assistants and Nurse Practitioners) who all work together to provide you with the care you need, when you need it.  We recommend signing up for the patient portal called "MyChart".  Sign up information is provided on this After Visit Summary.  MyChart is used to connect with patients for Virtual Visits (Telemedicine).  Patients are able to view lab/test results, encounter notes, upcoming appointments, etc.  Non-urgent messages can be sent to your provider as well.   To learn more about what you can do with MyChart, go to [email or web address].    Your next appointment:   3 month(s)  The format for your next appointment:   In Person  Provider:   [name], MD  Thank you for choosing [facility]!!        Signed, [name], MD  09/12/2020 12:21 PM    [place]

## 2020-09-19 DIAGNOSIS — H18413 Arcus senilis, bilateral: Secondary | ICD-10-CM | POA: Diagnosis not present

## 2020-09-19 DIAGNOSIS — Z961 Presence of intraocular lens: Secondary | ICD-10-CM | POA: Diagnosis not present

## 2020-09-19 DIAGNOSIS — H26492 Other secondary cataract, left eye: Secondary | ICD-10-CM | POA: Diagnosis not present

## 2020-09-19 DIAGNOSIS — H02831 Dermatochalasis of right upper eyelid: Secondary | ICD-10-CM | POA: Diagnosis not present

## 2020-09-26 DIAGNOSIS — H43393 Other vitreous opacities, bilateral: Secondary | ICD-10-CM | POA: Diagnosis not present

## 2020-09-26 DIAGNOSIS — H524 Presbyopia: Secondary | ICD-10-CM | POA: Diagnosis not present

## 2020-09-26 DIAGNOSIS — H52223 Regular astigmatism, bilateral: Secondary | ICD-10-CM | POA: Diagnosis not present

## 2020-09-26 DIAGNOSIS — Z9849 Cataract extraction status, unspecified eye: Secondary | ICD-10-CM | POA: Diagnosis not present

## 2020-09-26 DIAGNOSIS — H5201 Hypermetropia, right eye: Secondary | ICD-10-CM | POA: Diagnosis not present

## 2020-09-28 ENCOUNTER — Other Ambulatory Visit: Payer: Self-pay | Admitting: Cardiology

## 2020-10-24 DIAGNOSIS — R300 Dysuria: Secondary | ICD-10-CM | POA: Diagnosis not present

## 2020-11-04 DIAGNOSIS — F320 Major depressive disorder, single episode, mild: Secondary | ICD-10-CM | POA: Diagnosis not present

## 2020-11-04 DIAGNOSIS — I129 Hypertensive chronic kidney disease with stage 1 through stage 4 chronic kidney disease, or unspecified chronic kidney disease: Secondary | ICD-10-CM | POA: Diagnosis not present

## 2020-11-04 DIAGNOSIS — I480 Paroxysmal atrial fibrillation: Secondary | ICD-10-CM | POA: Diagnosis not present

## 2020-11-04 DIAGNOSIS — E039 Hypothyroidism, unspecified: Secondary | ICD-10-CM | POA: Diagnosis not present

## 2020-11-18 ENCOUNTER — Other Ambulatory Visit: Payer: Self-pay | Admitting: Gastroenterology

## 2020-11-18 DIAGNOSIS — K649 Unspecified hemorrhoids: Secondary | ICD-10-CM | POA: Diagnosis not present

## 2020-11-18 DIAGNOSIS — R1310 Dysphagia, unspecified: Secondary | ICD-10-CM

## 2020-11-18 DIAGNOSIS — K5900 Constipation, unspecified: Secondary | ICD-10-CM | POA: Diagnosis not present

## 2020-11-20 ENCOUNTER — Ambulatory Visit
Admission: RE | Admit: 2020-11-20 | Discharge: 2020-11-20 | Disposition: A | Discharge status: Home / Self Care | Payer: Medicare Other | Source: Ambulatory Visit | Attending: Gastroenterology | Admitting: Gastroenterology

## 2020-11-20 ENCOUNTER — Other Ambulatory Visit: Payer: Self-pay

## 2020-11-20 ENCOUNTER — Other Ambulatory Visit: Payer: Self-pay | Admitting: Gastroenterology

## 2020-11-20 DIAGNOSIS — R1310 Dysphagia, unspecified: Secondary | ICD-10-CM | POA: Diagnosis not present

## 2020-12-02 DIAGNOSIS — K5900 Constipation, unspecified: Secondary | ICD-10-CM | POA: Diagnosis not present

## 2020-12-02 DIAGNOSIS — K649 Unspecified hemorrhoids: Secondary | ICD-10-CM | POA: Diagnosis not present

## 2020-12-02 DIAGNOSIS — R1310 Dysphagia, unspecified: Secondary | ICD-10-CM | POA: Diagnosis not present

## 2020-12-04 DIAGNOSIS — E039 Hypothyroidism, unspecified: Secondary | ICD-10-CM | POA: Diagnosis not present

## 2020-12-04 DIAGNOSIS — I480 Paroxysmal atrial fibrillation: Secondary | ICD-10-CM | POA: Diagnosis not present

## 2020-12-04 DIAGNOSIS — F320 Major depressive disorder, single episode, mild: Secondary | ICD-10-CM | POA: Diagnosis not present

## 2020-12-04 DIAGNOSIS — I129 Hypertensive chronic kidney disease with stage 1 through stage 4 chronic kidney disease, or unspecified chronic kidney disease: Secondary | ICD-10-CM | POA: Diagnosis not present

## 2020-12-11 ENCOUNTER — Other Ambulatory Visit: Payer: Medicare Other | Admitting: *Deleted

## 2020-12-11 ENCOUNTER — Other Ambulatory Visit: Payer: Self-pay

## 2020-12-11 DIAGNOSIS — I2510 Atherosclerotic heart disease of native coronary artery without angina pectoris: Secondary | ICD-10-CM | POA: Diagnosis not present

## 2020-12-11 DIAGNOSIS — Z79899 Other long term (current) drug therapy: Secondary | ICD-10-CM | POA: Diagnosis not present

## 2020-12-11 LAB — LIPID PANEL
Chol/HDL Ratio: 2.1 ratio (ref 0.0–4.4)
Cholesterol, Total: 126 mg/dL (ref 100–199)
HDL: 61 mg/dL (ref >39)
LDL Chol Calc (NIH): 45 mg/dL (ref 0–99)
Triglycerides: 110 mg/dL (ref 0–149)
VLDL Cholesterol Cal: 20 mg/dL (ref 5–40)

## 2020-12-11 LAB — ALT: ALT: 13 IU/L (ref 0–32)

## 2020-12-16 ENCOUNTER — Ambulatory Visit: Payer: Medicare Other | Admitting: Cardiology

## 2020-12-25 DIAGNOSIS — I480 Paroxysmal atrial fibrillation: Secondary | ICD-10-CM | POA: Diagnosis not present

## 2020-12-25 DIAGNOSIS — E039 Hypothyroidism, unspecified: Secondary | ICD-10-CM | POA: Diagnosis not present

## 2020-12-25 DIAGNOSIS — I129 Hypertensive chronic kidney disease with stage 1 through stage 4 chronic kidney disease, or unspecified chronic kidney disease: Secondary | ICD-10-CM | POA: Diagnosis not present

## 2020-12-25 DIAGNOSIS — F320 Major depressive disorder, single episode, mild: Secondary | ICD-10-CM | POA: Diagnosis not present

## 2020-12-30 ENCOUNTER — Encounter: Payer: Self-pay | Admitting: Internal Medicine

## 2020-12-30 ENCOUNTER — Other Ambulatory Visit: Payer: Self-pay

## 2020-12-30 ENCOUNTER — Ambulatory Visit (INDEPENDENT_AMBULATORY_CARE_PROVIDER_SITE_OTHER): Payer: Medicare Other | Admitting: Internal Medicine

## 2020-12-30 VITALS — BP 122/64 | HR 64 | Ht 67.5 in | Wt 175.0 lb

## 2020-12-30 DIAGNOSIS — I10 Essential (primary) hypertension: Secondary | ICD-10-CM | POA: Diagnosis not present

## 2020-12-30 DIAGNOSIS — I440 Atrioventricular block, first degree: Secondary | ICD-10-CM

## 2020-12-30 DIAGNOSIS — I4819 Other persistent atrial fibrillation: Secondary | ICD-10-CM | POA: Diagnosis not present

## 2020-12-30 DIAGNOSIS — I2510 Atherosclerotic heart disease of native coronary artery without angina pectoris: Secondary | ICD-10-CM | POA: Diagnosis not present

## 2020-12-30 DIAGNOSIS — D6869 Other thrombophilia: Secondary | ICD-10-CM | POA: Diagnosis not present

## 2020-12-30 NOTE — Progress Notes (Signed)
PCP: Lajean Manes, MD Primary Cardiologist: Dr Marlou Porch Primary EP: Dr Rayann Heman  Frances [name] is a 87 y.o. female who presents today for routine electrophysiology followup.  Since last being seen in our clinic, the patient reports doing very well.  She has occasional postural dizziness.  afib is improved.  Today, she denies symptoms of palpitations, chest pain, shortness of breath,  lower extremity edema,   presyncope, or syncope.  The patient is otherwise without complaint today.   Past Medical History:  Diagnosis Date  . Arthritis    hands, back  . Asthmatic bronchitis   . Atrial fibrillation ([place]) 07/31/2013  . Chronic anticoagulation 07/30/2014  . Chronic kidney disease    stage 3 renal disease - no med  . Dizziness 07/30/2014  . Dysrhythmia    Hx - a-fib 05/2010 - tx with meds, no problem since 05/2010  . First degree AV block 07/30/2014  . Hearing loss    bilateral hearing aids  . Hypertension    controlled with meds  . Hypothyroidism   . Internal hemorrhoid   . PAF (paroxysmal atrial fibrillation) ([place]) 11/28/2015  . Paroxysmal atrial fibrillation (HCC)   . Peripheral vascular disease ([place])    right arm and right shoulder blood clots r/t a fall  . Persistent atrial fibrillation ([place]) 09/28/2017  . Rectal bleeding   . SVD (spontaneous vaginal delivery) 1957, 1959   x 2  . Thyroid disease    hypothyroidism   Past Surgical History:  Procedure Laterality Date  . ABDOMINAL HYSTERECTOMY     and right ovary  . ANTERIOR AND POSTERIOR REPAIR N/A 01/03/2014   Procedure: ANTERIOR (CYSTOCELE) REPAIR;  Surgeon: Floyce Stakes. Pamala Hurry, MD;  Location: [place] [facility];  Service: Gynecology;  Laterality: N/A;  . APPENDECTOMY    . ATRIAL FIBRILLATION ABLATION N/A 09/28/2017   Procedure: ATRIAL FIBRILLATION ABLATION;  Surgeon: [name], MD;  Location: [place] [facility];  Service: Cardiovascular;  Laterality: N/A;  . BACK SURGERY     x 3 - 2 rods and 8 screws  . CARDIOVERSION N/A  08/24/2017   Procedure: CARDIOVERSION;  Surgeon: Jerline Pain, MD;  Location: [facility] ENDOSCOPY;  Service: Cardiovascular;  Laterality: N/A;  . CARDIOVERSION N/A 10/19/2017   Procedure: CARDIOVERSION;  Surgeon: Josue Hector, MD;  Location: [facility] ENDOSCOPY;  Service: Cardiovascular;  Laterality: N/A;  . CARDIOVERSION N/A 10/04/2019   Procedure: CARDIOVERSION;  Surgeon: Pixie Casino, MD;  Location: [facility] ENDOSCOPY;  Service: Cardiovascular;  Laterality: N/A;  . COLONOSCOPY    . CYSTOCELE REPAIR    . DENTAL SURGERY     infected tooth - general  . DILATION AND CURETTAGE OF UTERUS     x several  . ELECTROPHYSIOLOGIC STUDY N/A 11/28/2015   Procedure: Atrial Fibrillation Ablation;  Surgeon: [name], MD;  Location: [place] [facility];  Service: Cardiovascular;  Laterality: N/A;  . EP IMPLANTABLE DEVICE N/A 06/08/2016   Procedure: Loop Recorder Insertion;  Surgeon: [name], MD;  Location: [place] [facility];  Service: Cardiovascular;  Laterality: N/A;  . EXAMINATION UNDER ANESTHESIA N/A 01/15/2014   Procedure: EXAM UNDER ANESTHESIA with evacuation of hematoma and over sewing of vaginal mucosa.;  Surgeon: Floyce Stakes. Pamala Hurry, MD;  Location: [place] [facility];  Service: Gynecology;  Laterality: N/A;  . HAND SURGERY     left  . hemorrhoid injection  04/2011  . JOINT REPLACEMENT     left thumb replacement  . MYRINGOTOMY WITH TUBE PLACEMENT Right 10/27/2018  . TEE  WITHOUT CARDIOVERSION N/A 09/27/2017   Procedure: TRANSESOPHAGEAL ECHOCARDIOGRAM (TEE);  Surgeon: Sueanne Margarita, MD;  Location: [facility] ENDOSCOPY;  Service: Cardiovascular;  Laterality: N/A;  . TONSILLECTOMY      ROS- all systems are reviewed and negatives except as per HPI above  Current Outpatient Medications  Medication Sig Dispense Refill  . acetaminophen (TYLENOL) 500 MG tablet Take 500-1,000 mg by mouth every 6 (six) hours as needed for moderate pain or headache.    . Biotin 5 MG CAPS Take 5 mg by mouth daily with lunch. 2500 mcg    . Calcium  Carb-Cholecalciferol (CALCIUM 600 + D PO) Take 1 tablet by mouth daily with lunch. 600 mg / 20 mcg    . diltiazem (CARDIZEM) 30 MG tablet TAKE 1 TABLET BY MOUTH EVERY 4 HOURS AS NEEDED (FOR FAST HEART RATE > 100 AS LONG AS BP > 100). 135 tablet 1  . ELIQUIS 5 MG TABS tablet TAKE 1 TABLET BY MOUTH TWICE A DAY 180 tablet 1  . famotidine (PEPCID) 10 MG tablet Take 10 mg by mouth 2 (two) times daily as needed for heartburn or indigestion.    . flecainide (TAMBOCOR) 50 MG tablet Take 1 tablet (50 mg total) by mouth 2 (two) times daily. 180 tablet 3  . hydrochlorothiazide (MICROZIDE) 12.5 MG capsule Take 12.5 mg by mouth daily.    . Inulin (FIBER CHOICE PREBIOTIC FIBER PO) Take 1 tablet by mouth 2 (two) times daily.    Marland Kitchen levothyroxine (SYNTHROID, LEVOTHROID) 75 MCG tablet Take 75 mcg by mouth daily before breakfast.    . PARoxetine (PAXIL) 30 MG tablet Take 30 mg by mouth daily.    . Probiotic Product (PROBIOTIC PO) Take 1 capsule by mouth daily with lunch.    . psyllium (METAMUCIL) 58.6 % powder Take 1 packet by mouth 4 (four) times a week.     . ramipril (ALTACE) 10 MG capsule Take 10 mg by mouth 2 (two) times daily.     . rosuvastatin (CRESTOR) 10 MG tablet Take 1 tablet (10 mg total) by mouth daily. 90 tablet 3  . sodium chloride (OCEAN) 0.65 % SOLN nasal spray Place 2 sprays into both nostrils daily as needed for congestion.     . vitamin B-12 (CYANOCOBALAMIN) 1000 MCG tablet Take 1,000 mcg by mouth daily with lunch.    . diltiazem (CARDIZEM CD) 300 MG 24 hr capsule Take 1 capsule (300 mg total) by mouth daily. 90 capsule 3   No current facility-administered medications for this visit.    Physical Exam: Vitals:   12/30/20 1234  BP: 122/64  Pulse: 64  SpO2: 98%  Weight: 175 lb (79.4 kg)  Height: 5' 7.5" (1.715 m)    GEN- The patient is well appearing, alert and oriented x 3 today.   Head- normocephalic, atraumatic Eyes-  Sclera clear, conjunctiva pink Ears- hearing intact Oropharynx-  clear Lungs- Clear to ausculation bilaterally, normal work of breathing Heart- Regular rate and rhythm, no murmurs, rubs or gallops, PMI not laterally displaced GI- soft, NT, ND, + BS Extremities- no clubbing, cyanosis, or edema  Wt Readings from Last 3 Encounters:  12/30/20 175 lb (79.4 kg)  09/12/20 171 lb 9.6 oz (77.8 kg)  07/01/20 168 lb (76.2 kg)    EKG tracing ordered today is personally reviewed and shows sinus, PR 252 msec,  Assessment and Plan:  1. Persistent afib Doing well s/p ablation Started on flecainide last visit and having less afib She is on eliquis Her  ILR is no longer functional.  She is not ready to have this removed.  She is following her AF with her apple watch  2. HTN Well controlled Stop hctz which may be contributing to dizziness  3. First degree AV block Stable Consider reducing diltiazem to 180mg  daily if AF remains controlled on follow-up in the AF clinic  Risks, benefits and potential toxicities for medications prescribed and/or refilled reviewed with patient today.   AF clinic in 6 months  [name] MD, [facility] 12/30/2020 12:53 PM

## 2020-12-30 NOTE — Patient Instructions (Addendum)
Medication Instructions:  Stop your Hydrochlorothiazide  Your physician recommends that you continue on your current medications as directed. Please refer to the Current Medication list given to you today.  Labwork: None ordered.  Testing/Procedures: None ordered.  Follow-Up: Your physician wants you to follow-up in: 6 months with the Afib Clinic. They will contact you to schedule.   Any Other Special Instructions Will Be Listed Below (If Applicable).  If you need a refill on your cardiac medications before your next appointment, please call your pharmacy.

## 2021-01-08 ENCOUNTER — Ambulatory Visit: Payer: Medicare Other | Admitting: Cardiology

## 2021-01-15 DIAGNOSIS — I700 Atherosclerosis of aorta: Secondary | ICD-10-CM | POA: Diagnosis not present

## 2021-01-15 DIAGNOSIS — I129 Hypertensive chronic kidney disease with stage 1 through stage 4 chronic kidney disease, or unspecified chronic kidney disease: Secondary | ICD-10-CM | POA: Diagnosis not present

## 2021-01-15 DIAGNOSIS — I480 Paroxysmal atrial fibrillation: Secondary | ICD-10-CM | POA: Diagnosis not present

## 2021-01-15 DIAGNOSIS — N1831 Chronic kidney disease, stage 3a: Secondary | ICD-10-CM | POA: Diagnosis not present

## 2021-01-27 DIAGNOSIS — N1831 Chronic kidney disease, stage 3a: Secondary | ICD-10-CM | POA: Diagnosis not present

## 2021-01-27 DIAGNOSIS — E039 Hypothyroidism, unspecified: Secondary | ICD-10-CM | POA: Diagnosis not present

## 2021-01-27 DIAGNOSIS — F320 Major depressive disorder, single episode, mild: Secondary | ICD-10-CM | POA: Diagnosis not present

## 2021-01-27 DIAGNOSIS — I129 Hypertensive chronic kidney disease with stage 1 through stage 4 chronic kidney disease, or unspecified chronic kidney disease: Secondary | ICD-10-CM | POA: Diagnosis not present

## 2021-01-27 DIAGNOSIS — I480 Paroxysmal atrial fibrillation: Secondary | ICD-10-CM | POA: Diagnosis not present

## 2021-02-05 ENCOUNTER — Other Ambulatory Visit (HOSPITAL_COMMUNITY): Payer: Self-pay | Admitting: Nurse Practitioner

## 2021-02-05 NOTE — Telephone Encounter (Signed)
Pt last saw Dr Rayann Heman 12/30/20, last labs 05/27/20 Creat 0.98, age 87, weight 79.4kg, based on specified criteria pt is on appropriate dosage of Eliquis 5mg  BID for afib.  Will refill rx.

## 2021-02-27 DIAGNOSIS — I480 Paroxysmal atrial fibrillation: Secondary | ICD-10-CM | POA: Diagnosis not present

## 2021-02-27 DIAGNOSIS — F320 Major depressive disorder, single episode, mild: Secondary | ICD-10-CM | POA: Diagnosis not present

## 2021-02-27 DIAGNOSIS — I129 Hypertensive chronic kidney disease with stage 1 through stage 4 chronic kidney disease, or unspecified chronic kidney disease: Secondary | ICD-10-CM | POA: Diagnosis not present

## 2021-02-27 DIAGNOSIS — E039 Hypothyroidism, unspecified: Secondary | ICD-10-CM | POA: Diagnosis not present

## 2021-02-27 DIAGNOSIS — N1831 Chronic kidney disease, stage 3a: Secondary | ICD-10-CM | POA: Diagnosis not present

## 2021-03-19 ENCOUNTER — Encounter: Payer: Self-pay | Admitting: Cardiology

## 2021-03-19 ENCOUNTER — Ambulatory Visit (INDEPENDENT_AMBULATORY_CARE_PROVIDER_SITE_OTHER): Payer: Medicare Other | Admitting: Cardiology

## 2021-03-19 ENCOUNTER — Other Ambulatory Visit: Payer: Self-pay

## 2021-03-19 VITALS — BP 120/60 | HR 76 | Ht 67.5 in | Wt 174.0 lb

## 2021-03-19 DIAGNOSIS — I4819 Other persistent atrial fibrillation: Secondary | ICD-10-CM

## 2021-03-19 DIAGNOSIS — I2510 Atherosclerotic heart disease of native coronary artery without angina pectoris: Secondary | ICD-10-CM

## 2021-03-19 DIAGNOSIS — Z7901 Long term (current) use of anticoagulants: Secondary | ICD-10-CM | POA: Diagnosis not present

## 2021-03-19 NOTE — Progress Notes (Signed)
Cardiology Office Note:    Date:  03/19/2021   ID:  Frances [name], DOB 23-May-1934, MRN [id number]  PCP:  Lajean Manes, MD   [facility] Providers Cardiologist:  [name], MD     Referring MD: Lajean Manes, MD     History of Present Illness:    Frances [name] is a 87 y.o. female here for the follow-up of atrial fibrillation.  Previously has seen Dr. Rayann Heman.  Prior EP notes reviewed.  Implantable loop recorder battery is no longer functional.  She does use the apple watch however to track rhythm.  Flecainide has been utilized.  She has discussed other antiarrhythmic such as Tikosyn and amiodarone and ablation pros and cons.  Roderic Palau in the A. fib clinic is also seeing her as well.  Her QRS is 90 ms.  Unfortunately her husband has died last year with multiple myeloma.  She is helping to take care of her son age 13 with Parkinson's.  She does have family history of CAD with her brother having CABG and dying at age 9.  Mother died of myocardial infarction at age 62.  Occasionally she may feel some racing or dizziness.  Otherwise doing quite well.  Denies any fevers chills nausea vomiting syncope bleeding.  Past Medical History:  Diagnosis Date   Arthritis    hands, back   Asthmatic bronchitis    Atrial fibrillation ([place]) 07/31/2013   Chronic anticoagulation 07/30/2014   Chronic kidney disease    stage 3 renal disease - no med   Dizziness 07/30/2014   Dysrhythmia    Hx - a-fib 05/2010 - tx with meds, no problem since 05/2010   First degree AV block 07/30/2014   Hearing loss    bilateral hearing aids   Hypertension    controlled with meds   Hypothyroidism    Internal hemorrhoid    PAF (paroxysmal atrial fibrillation) ([place]) 11/28/2015   Paroxysmal atrial fibrillation (H[facility])    Peripheral vascular disease ([place])    right arm and right shoulder blood clots r/t a fall   Persistent atrial fibrillation ([place]) 09/28/2017   Rectal bleeding    SVD (spontaneous  vaginal delivery) 1957, 1959   x 2   Thyroid disease    hypothyroidism    Past Surgical History:  Procedure Laterality Date   ABDOMINAL HYSTERECTOMY     and right ovary   ANTERIOR AND POSTERIOR REPAIR N/A 01/03/2014   Procedure: ANTERIOR (CYSTOCELE) REPAIR;  Surgeon: Claiborne Billings A. Pamala Hurry, MD;  Location: [facility];  Service: Gynecology;  Laterality: N/A;   APPENDECTOMY     ATRIAL FIBRILLATION ABLATION N/A 09/28/2017   Procedure: ATRIAL FIBRILLATION ABLATION;  Surgeon: [name], MD;  Location: [place] [facility];  Service: Cardiovascular;  Laterality: N/A;   BACK SURGERY     x 3 - 2 rods and 8 screws   CARDIOVERSION N/A 08/24/2017   Procedure: CARDIOVERSION;  Surgeon: Jerline Pain, MD;  Location: [place];  Service: Cardiovascular;  Laterality: N/A;   CARDIOVERSION N/A 10/19/2017   Procedure: CARDIOVERSION;  Surgeon: Josue Hector, MD;  Location: [facility]COPY;  Service: Cardiovascular;  Laterality: N/A;   CARDIOVERSION N/A 10/04/2019   Procedure: CARDIOVERSION;  Surgeon: Pixie Casino, MD;  Location: [facility] ENDOSCOPY;  Service: Cardiovascular;  Laterality: N/A;   COLONOSCOPY     CYSTOCELE REPAIR     DENTAL SURGERY     infected tooth - general   DILATION AND CURETTAGE OF UTERUS     x  several   ELECTROPHYSIOLOGIC STUDY N/A 11/28/2015   Procedure: Atrial Fibrillation Ablation;  Surgeon: [name], MD;  Location: [place] [facility];  Service: Cardiovascular;  Laterality: N/A;   EP IMPLANTABLE DEVICE N/A 06/08/2016   Procedure: Loop Recorder Insertion;  Surgeon: [name], MD;  Location: [place] [facility];  Service: Cardiovascular;  Laterality: N/A;   EXAMINATION UNDER ANESTHESIA N/A 01/15/2014   Procedure: EXAM UNDER ANESTHESIA with evacuation of hematoma and over sewing of vaginal mucosa.;  Surgeon: Floyce Stakes. Pamala Hurry, MD;  Location: [place] [facility];  Service: Gynecology;  Laterality: N/A;   HAND SURGERY     left   hemorrhoid injection  04/2011   JOINT REPLACEMENT     left thumb  replacement   MYRINGOTOMY WITH TUBE PLACEMENT Right 10/27/2018   TEE WITHOUT CARDIOVERSION N/A 09/27/2017   Procedure: TRANSESOPHAGEAL ECHOCARDIOGRAM (TEE);  Surgeon: Sueanne Margarita, MD;  Location: [facility]OSCOPY;  Service: Cardiovascular;  Laterality: N/A;   TONSILLECTOMY      Current Medications: Current Meds  Medication Sig   acetaminophen (TYLENOL) 500 MG tablet Take 500-1,000 mg by mouth every 6 (six) hours as needed for moderate pain or headache.   Biotin 5 MG CAPS Take 5 mg by mouth daily with lunch. 2500 mcg   Calcium Carb-Cholecalciferol (CALCIUM 600 + D PO) Take 1 tablet by mouth daily with lunch. 600 mg / 20 mcg   diltiazem (CARDIZEM CD) 300 MG 24 hr capsule Take 1 capsule (300 mg total) by mouth daily.   diltiazem (CARDIZEM) 30 MG tablet TAKE 1 TABLET BY MOUTH EVERY 4 HOURS AS NEEDED (FOR FAST HEART RATE > 100 AS LONG AS BP > 100).   ELIQUIS 5 MG TABS tablet TAKE 1 TABLET BY MOUTH TWICE A DAY   famotidine (PEPCID) 10 MG tablet Take 10 mg by mouth 2 (two) times daily as needed for heartburn or indigestion.   flecainide (TAMBOCOR) 50 MG tablet Take 1 tablet (50 mg total) by mouth 2 (two) times daily.   Inulin (FIBER CHOICE PREBIOTIC FIBER PO) Take 1 tablet by mouth 2 (two) times daily.   levothyroxine (SYNTHROID, LEVOTHROID) 75 MCG tablet Take 75 mcg by mouth daily before breakfast.   PARoxetine (PAXIL) 30 MG tablet Take 30 mg by mouth daily.   Probiotic Product (PROBIOTIC PO) Take 1 capsule by mouth daily with lunch.   psyllium (METAMUCIL) 58.6 % powder Take 1 packet by mouth 4 (four) times a week.    ramipril (ALTACE) 10 MG capsule Take 10 mg by mouth 2 (two) times daily.    rosuvastatin (CRESTOR) 10 MG tablet Take 1 tablet (10 mg total) by mouth daily.   sodium chloride (OCEAN) 0.65 % SOLN nasal spray Place 2 sprays into both nostrils daily as needed for congestion.    vitamin B-12 (CYANOCOBALAMIN) 1000 MCG tablet Take 1,000 mcg by mouth daily with lunch.     Allergies:    Pneumovax 23 [pneumococcal vac polyvalent], Codeine, Cephalexin, Ciprofloxacin, Sudafed [pseudoephedrine hcl], and Sulfa antibiotics   Social History   Socioeconomic History   Marital status: Married    Spouse name: Not on file   Number of children: Not on file   Years of education: Not on file   Highest education level: Not on file  Occupational History   Not on file  Tobacco Use   Smoking status: Never   Smokeless tobacco: Never  Vaping Use   Vaping Use: Never used  Substance and Sexual Activity   Alcohol use: No  Drug use: No   Sexual activity: Not Currently    Birth control/protection: Post-menopausal  Other Topics Concern   Not on file  Social History Narrative   Not on file   Social Determinants of Health   Financial Resource Strain: Not on file  Food Insecurity: Not on file  Transportation Needs: Not on file  Physical Activity: Not on file  Stress: Not on file  Social Connections: Not on file     Family History: The patient's family history includes Diabetes in her father; Heart attack in her mother and sister; Heart disease in her brother, mother, and sister; Heart failure in her brother; Hypertension in her father and sister; Stroke in her father; Sudden death in her maternal grandfather.  ROS:   Please see the history of present illness.     All other systems reviewed and are negative.  EKGs/Labs/Other Studies Reviewed:    The following studies were reviewed today: Coronary calcium score 20 17-777, 91st percentile.  Chest CT from 2019 showed dense LAD calcification.  Aortic atherosclerosis as well.   Recent Labs: 05/27/2020: BUN 17; Creatinine, Ser 0.98; Hemoglobin 13.5; Platelets 252; Potassium 4.1; Sodium 138 12/11/2020: ALT 13  Recent Lipid Panel    Component Value Date/Time   CHOL 126 12/11/2020 0919   TRIG 110 12/11/2020 0919   HDL 61 12/11/2020 0919   CHOLHDL 2.1 12/11/2020 0919   LDLCALC 45 12/11/2020 0919     Risk  Assessment/Calculations:          Physical Exam:    VS:  BP 120/60 (BP Location: Left Arm, Patient Position: Sitting, Cuff Size: Normal)   Pulse 76   Ht 5' 7.5" (1.715 m)   Wt 174 lb (78.9 kg)   LMP  (LMP Unknown)   SpO2 96%   BMI 26.85 kg/m     Wt Readings from Last 3 Encounters:  03/19/21 174 lb (78.9 kg)  12/30/20 175 lb (79.4 kg)  09/12/20 171 lb 9.6 oz (77.8 kg)     GEN:  Well nourished, well developed in no acute distress HEENT: Normal NECK: No JVD; No carotid bruits LYMPHATICS: No lymphadenopathy CARDIAC: RRR, no murmurs, rubs, gallops RESPIRATORY:  Clear to auscultation without rales, wheezing or rhonchi  ABDOMEN: Soft, non-tender, non-distended MUSCULOSKELETAL:  No edema; No deformity  SKIN: Warm and dry NEUROLOGIC:  Alert and oriented x 3 PSYCHIATRIC:  Normal affect   ASSESSMENT:    1. Persistent atrial fibrillation ([place])   2. Coronary artery disease involving native coronary artery of native heart without angina pectoris   3. Chronic anticoagulation    PLAN:    In order of problems listed above:  Coronary artery disease atherosclerosis coronary calcification-score of 777 - Reviewed her prior CT scan, dense LAD calcification. - At prior visit we started Crestor 10 mg once a day for protection.  Her LDL is 45 from outside labs.  ALT 13.  Creatinine 1.0 potassium 4.2.  Paroxysmal atrial fibrillation - Overall doing quite well with flecainide 50 mg twice a day for medical management.  Refills as needed. -Has apple watch for monitoring.  Chronic anticoagulation - Continuing with Eliquis.  No aspirin.  No bleeding side effects.  Last hemoglobin was 13.4, creatinine 1.02.  Lightheaded -- Dr. Rayann Heman had stopped HCTZ. Omron at home 130/70. Still light headed.  Upon further questioning, she states that when she is standing and she turns her head abruptly she feels lightheaded or feels like she is going to fall over.  Her symptoms  are most likely compatible  with disequilibrium or vertigo-like symptoms.  She will discuss further with Dr. Felipa Eth.  Her blood pressures are fine off of the hydrochlorothiazide however.       Medication Adjustments/Labs and Tests Ordered: Current medicines are reviewed at length with the patient today.  Concerns regarding medicines are outlined above.  No orders of the defined types were placed in this encounter.  No orders of the defined types were placed in this encounter.   Patient Instructions  Medication Instructions:  The current medical regimen is effective;  continue present plan and medications.  *If you need a refill on your cardiac medications before your next appointment, please call your pharmacy*  Follow-Up: At [facility], you and your health needs are our priority.  As part of our continuing mission to provide you with exceptional heart care, we have created designated Provider Care Teams.  These Care Teams include your primary Cardiologist (physician) and Advanced Practice Providers (APPs -  Physician Assistants and Nurse Practitioners) who all work together to provide you with the care you need, when you need it.  We recommend signing up for the patient portal called "MyChart".  Sign up information is provided on this After Visit Summary.  MyChart is used to connect with patients for Virtual Visits (Telemedicine).  Patients are able to view lab/test results, encounter notes, upcoming appointments, etc.  Non-urgent messages can be sent to your provider as well.   To learn more about what you can do with MyChart, go to [email or web address].    Your next appointment:   6 month(s)  The format for your next appointment:   In Person  Provider:   [name], MD   Thank you for choosing [facility]!!     Signed, [name], MD  03/19/2021 11:01 AM    [place]

## 2021-03-19 NOTE — Patient Instructions (Signed)

## 2021-03-31 DIAGNOSIS — I480 Paroxysmal atrial fibrillation: Secondary | ICD-10-CM | POA: Diagnosis not present

## 2021-03-31 DIAGNOSIS — E039 Hypothyroidism, unspecified: Secondary | ICD-10-CM | POA: Diagnosis not present

## 2021-03-31 DIAGNOSIS — F320 Major depressive disorder, single episode, mild: Secondary | ICD-10-CM | POA: Diagnosis not present

## 2021-03-31 DIAGNOSIS — N1831 Chronic kidney disease, stage 3a: Secondary | ICD-10-CM | POA: Diagnosis not present

## 2021-03-31 DIAGNOSIS — I129 Hypertensive chronic kidney disease with stage 1 through stage 4 chronic kidney disease, or unspecified chronic kidney disease: Secondary | ICD-10-CM | POA: Diagnosis not present

## 2021-04-16 ENCOUNTER — Other Ambulatory Visit: Payer: Self-pay | Admitting: Gastroenterology

## 2021-04-16 DIAGNOSIS — R1310 Dysphagia, unspecified: Secondary | ICD-10-CM | POA: Diagnosis not present

## 2021-04-18 ENCOUNTER — Other Ambulatory Visit (HOSPITAL_COMMUNITY): Payer: Self-pay | Admitting: *Deleted

## 2021-04-18 DIAGNOSIS — R059 Cough, unspecified: Secondary | ICD-10-CM

## 2021-04-18 DIAGNOSIS — R1310 Dysphagia, unspecified: Secondary | ICD-10-CM

## 2021-04-22 ENCOUNTER — Ambulatory Visit (HOSPITAL_COMMUNITY)
Admission: RE | Admit: 2021-04-22 | Discharge: 2021-04-22 | Disposition: A | Discharge status: Home / Self Care | Payer: Medicare Other | Source: Ambulatory Visit | Attending: Gastroenterology | Admitting: Gastroenterology

## 2021-04-22 ENCOUNTER — Other Ambulatory Visit: Payer: Self-pay

## 2021-04-22 DIAGNOSIS — R059 Cough, unspecified: Secondary | ICD-10-CM | POA: Insufficient documentation

## 2021-04-22 DIAGNOSIS — J392 Other diseases of pharynx: Secondary | ICD-10-CM | POA: Diagnosis not present

## 2021-04-22 DIAGNOSIS — R1310 Dysphagia, unspecified: Secondary | ICD-10-CM | POA: Diagnosis not present

## 2021-04-24 DIAGNOSIS — Z23 Encounter for immunization: Secondary | ICD-10-CM | POA: Diagnosis not present

## 2021-04-28 DIAGNOSIS — N1831 Chronic kidney disease, stage 3a: Secondary | ICD-10-CM | POA: Diagnosis not present

## 2021-04-28 DIAGNOSIS — I480 Paroxysmal atrial fibrillation: Secondary | ICD-10-CM | POA: Diagnosis not present

## 2021-04-28 DIAGNOSIS — E039 Hypothyroidism, unspecified: Secondary | ICD-10-CM | POA: Diagnosis not present

## 2021-04-28 DIAGNOSIS — I129 Hypertensive chronic kidney disease with stage 1 through stage 4 chronic kidney disease, or unspecified chronic kidney disease: Secondary | ICD-10-CM | POA: Diagnosis not present

## 2021-04-28 DIAGNOSIS — F320 Major depressive disorder, single episode, mild: Secondary | ICD-10-CM | POA: Diagnosis not present

## 2021-05-05 ENCOUNTER — Other Ambulatory Visit: Payer: Self-pay | Admitting: Geriatric Medicine

## 2021-05-05 DIAGNOSIS — Z01419 Encounter for gynecological examination (general) (routine) without abnormal findings: Secondary | ICD-10-CM | POA: Diagnosis not present

## 2021-05-05 DIAGNOSIS — Z6829 Body mass index (BMI) 29.0-29.9, adult: Secondary | ICD-10-CM | POA: Diagnosis not present

## 2021-05-05 DIAGNOSIS — Z01411 Encounter for gynecological examination (general) (routine) with abnormal findings: Secondary | ICD-10-CM | POA: Diagnosis not present

## 2021-05-05 DIAGNOSIS — Z124 Encounter for screening for malignant neoplasm of cervix: Secondary | ICD-10-CM | POA: Diagnosis not present

## 2021-05-05 DIAGNOSIS — Z1231 Encounter for screening mammogram for malignant neoplasm of breast: Secondary | ICD-10-CM

## 2021-05-05 DIAGNOSIS — Z90711 Acquired absence of uterus with remaining cervical stump: Secondary | ICD-10-CM | POA: Diagnosis not present

## 2021-05-11 DIAGNOSIS — U071 COVID-19: Secondary | ICD-10-CM | POA: Diagnosis not present

## 2021-05-13 DIAGNOSIS — D6869 Other thrombophilia: Secondary | ICD-10-CM | POA: Diagnosis not present

## 2021-05-13 DIAGNOSIS — R051 Acute cough: Secondary | ICD-10-CM | POA: Diagnosis not present

## 2021-05-13 DIAGNOSIS — N1831 Chronic kidney disease, stage 3a: Secondary | ICD-10-CM | POA: Diagnosis not present

## 2021-05-13 DIAGNOSIS — I129 Hypertensive chronic kidney disease with stage 1 through stage 4 chronic kidney disease, or unspecified chronic kidney disease: Secondary | ICD-10-CM | POA: Diagnosis not present

## 2021-05-13 DIAGNOSIS — I480 Paroxysmal atrial fibrillation: Secondary | ICD-10-CM | POA: Diagnosis not present

## 2021-06-12 ENCOUNTER — Other Ambulatory Visit: Payer: Self-pay

## 2021-06-12 ENCOUNTER — Ambulatory Visit
Admission: RE | Admit: 2021-06-12 | Discharge: 2021-06-12 | Disposition: A | Discharge status: Home / Self Care | Payer: Medicare Other | Source: Ambulatory Visit | Attending: Geriatric Medicine | Admitting: Geriatric Medicine

## 2021-06-12 DIAGNOSIS — Z1231 Encounter for screening mammogram for malignant neoplasm of breast: Secondary | ICD-10-CM

## 2021-06-16 DIAGNOSIS — Z23 Encounter for immunization: Secondary | ICD-10-CM | POA: Diagnosis not present

## 2021-06-22 ENCOUNTER — Other Ambulatory Visit: Payer: Self-pay | Admitting: Internal Medicine

## 2021-07-01 ENCOUNTER — Ambulatory Visit (HOSPITAL_COMMUNITY)
Admission: RE | Admit: 2021-07-01 | Discharge: 2021-07-01 | Disposition: A | Discharge status: Home / Self Care | Payer: Medicare Other | Source: Ambulatory Visit | Attending: Nurse Practitioner | Admitting: Nurse Practitioner

## 2021-07-01 ENCOUNTER — Other Ambulatory Visit: Payer: Self-pay

## 2021-07-01 VITALS — BP 140/66 | HR 62 | Ht 67.5 in | Wt 177.8 lb

## 2021-07-01 DIAGNOSIS — E039 Hypothyroidism, unspecified: Secondary | ICD-10-CM | POA: Diagnosis not present

## 2021-07-01 DIAGNOSIS — Z882 Allergy status to sulfonamides status: Secondary | ICD-10-CM | POA: Insufficient documentation

## 2021-07-01 DIAGNOSIS — Z79890 Hormone replacement therapy (postmenopausal): Secondary | ICD-10-CM | POA: Insufficient documentation

## 2021-07-01 DIAGNOSIS — I4819 Other persistent atrial fibrillation: Secondary | ICD-10-CM | POA: Insufficient documentation

## 2021-07-01 DIAGNOSIS — D6869 Other thrombophilia: Secondary | ICD-10-CM | POA: Diagnosis not present

## 2021-07-01 DIAGNOSIS — I129 Hypertensive chronic kidney disease with stage 1 through stage 4 chronic kidney disease, or unspecified chronic kidney disease: Secondary | ICD-10-CM | POA: Insufficient documentation

## 2021-07-01 DIAGNOSIS — Z8249 Family history of ischemic heart disease and other diseases of the circulatory system: Secondary | ICD-10-CM | POA: Insufficient documentation

## 2021-07-01 DIAGNOSIS — H9193 Unspecified hearing loss, bilateral: Secondary | ICD-10-CM | POA: Insufficient documentation

## 2021-07-01 DIAGNOSIS — I480 Paroxysmal atrial fibrillation: Secondary | ICD-10-CM

## 2021-07-01 DIAGNOSIS — Z887 Allergy status to serum and vaccine status: Secondary | ICD-10-CM | POA: Insufficient documentation

## 2021-07-01 DIAGNOSIS — N183 Chronic kidney disease, stage 3 unspecified: Secondary | ICD-10-CM | POA: Diagnosis not present

## 2021-07-01 DIAGNOSIS — Z881 Allergy status to other antibiotic agents status: Secondary | ICD-10-CM | POA: Insufficient documentation

## 2021-07-01 DIAGNOSIS — Z79899 Other long term (current) drug therapy: Secondary | ICD-10-CM | POA: Diagnosis not present

## 2021-07-01 DIAGNOSIS — Z885 Allergy status to narcotic agent status: Secondary | ICD-10-CM | POA: Diagnosis not present

## 2021-07-01 DIAGNOSIS — Z7901 Long term (current) use of anticoagulants: Secondary | ICD-10-CM | POA: Insufficient documentation

## 2021-07-01 DIAGNOSIS — Z888 Allergy status to other drugs, medicaments and biological substances status: Secondary | ICD-10-CM | POA: Diagnosis not present

## 2021-07-01 DIAGNOSIS — I739 Peripheral vascular disease, unspecified: Secondary | ICD-10-CM | POA: Diagnosis not present

## 2021-07-01 LAB — CBC
HCT: 42.2 % (ref 36.0–46.0)
Hemoglobin: 12.9 g/dL (ref 12.0–15.0)
MCH: 28.1 pg (ref 26.0–34.0)
MCHC: 30.6 g/dL (ref 30.0–36.0)
MCV: 91.9 fL (ref 80.0–100.0)
Platelets: 219 10*3/uL (ref 150–400)
RBC: 4.59 MIL/uL (ref 3.87–5.11)
RDW: 14.4 % (ref 11.5–15.5)
WBC: 7 10*3/uL (ref 4.0–10.5)
nRBC: 0 % (ref 0.0–0.2)

## 2021-07-01 LAB — BASIC METABOLIC PANEL
Anion gap: 9 (ref 5–15)
BUN: 18 mg/dL (ref 8–23)
CO2: 26 mmol/L (ref 22–32)
Calcium: 9.2 mg/dL (ref 8.9–10.3)
Chloride: 102 mmol/L (ref 98–111)
Creatinine, Ser: 1.03 mg/dL — ABNORMAL HIGH (ref 0.44–1.00)
GFR, Estimated: 53 mL/min — ABNORMAL LOW (ref >60)
Glucose, Bld: 101 mg/dL — ABNORMAL HIGH (ref 70–99)
Potassium: 4.7 mmol/L (ref 3.5–5.1)
Sodium: 137 mmol/L (ref 135–145)

## 2021-07-01 NOTE — Progress Notes (Signed)
Due to national recommendations of social distancing due to [place] 19, Audio/video telehealth visit is felt to be most appropriate for this patient at this time.  See MyChart message/consent below  from today for patient consent regarding telehealth for the Atrial Fibrillation Clinic.    Date:  07/01/2021   ID:  Frances [name], DOB [birth year], MRN [id number]  Location: Office  Provider location: [place], [place] [address] Evaluation Performed: f/u persistent afib  PCP:  Lajean Manes, MD  Primary Cardiologist:   Dr. Marlou Porch  Primary Electrophysiologist: Dr. Rayann Heman   CC: persistent afib since last week    History of Present Illness: Frances [name] is a 87 y.o. female who presents  for persistent afib since last week.  Pt with h/o of 2 prior ablation 2017 and most recent 09/2017 that has experienced persistent afib since last week. No known triggers. Her PCP recently increased cardizem for BP issues but she has not started. V rate 129 bpm today. She had several days of afib last May but converted on her own and DCCV was cancelled. Continues on eliquis with a CHA2DS2VASc score of 4.   F/u in afib clinic, 10/11/19, after successful cardioversion.  She remains in SR. She feels improved. Has noted mild ankle edema since increasing cardizem to 300 mg daily. Received her second covid shot this last week.   F/u in afib [place] 05/27/20. She went into persistent afib one week ago. No change in health,  although she does tell me she loss her husband in April. Her adult son with Parkinson's is living with her since being dx'ed and his wife asking him for a divorce. She has both vacccines and no missed anticoagulation.   F/u in afib clinic 07/08/20. She spontaneously reverted to SR so cardioversion was cancelled. She then had f/u with Dr. Marlou Porch  and was in afib. She was scheduled an appointment with Dr. Rayann Heman and he gave her several options but she wanted to revisit flecainide  and was started on 50 mg bid. She is now in the office and is in SR with flecainide 50 mg bid.. She has a first degree block on flecainide similar  to EKG's on flecainide in the past. She feels well. No afib since start of flecainide.   F/u in afib clinic, 07/01/21. She  remains in SR. No issues with afib. Continues on flecainide 50 mg bid. No issues with Eliquis 5 mg bid for a CHA2DS2VASc  score of 5.   Today, she denies symptoms of palpitations, chest pain, shortness of breath, orthopnea, PND, lower extremity edema, claudication, dizziness, presyncope, syncope, bleeding, or neurologic sequela. The patient is tolerating medications without difficulties and is otherwise without complaint today.   she denies symptoms of cough, fevers, chills, or new SOB worrisome for COVID 19.    she has a BMI of Body mass index is 27.44 kg/m.Marland Kitchen Filed Weights   07/01/21 1330  Weight: 80.6 kg    Past Medical History:  Diagnosis Date   Arthritis    hands, back   Asthmatic bronchitis    Atrial fibrillation ([place]) 07/31/2013   Chronic anticoagulation 07/30/2014   Chronic kidney disease    stage 3 renal disease - no med   Dizziness 07/30/2014   Dysrhythmia    Hx - a-fib 05/2010 - tx with meds, no problem since 05/2010   First degree AV block 07/30/2014   Hearing loss    bilateral hearing aids  Hypertension    controlled with meds   Hypothyroidism    Internal hemorrhoid    PAF (paroxysmal atrial fibrillation) ([place]) 11/28/2015   Paroxysmal atrial fibrillation ([facility])    Peripheral vascular disease ([place])    right arm and right shoulder blood clots r/t a fall   Persistent atrial fibrillation ([place]) 09/28/2017   Rectal bleeding    SVD (spontaneous vaginal delivery) 1957, 1959   x 2   Thyroid disease    hypothyroidism   Past Surgical History:  Procedure Laterality Date   ABDOMINAL HYSTERECTOMY     and right ovary   ANTERIOR AND POSTERIOR REPAIR N/A 01/03/2014   Procedure: ANTERIOR (CYSTOCELE) REPAIR;   Surgeon: Claiborne Billings A. Pamala Hurry, MD;  Location: [place] [facility];  Service: Gynecology;  Laterality: N/A;   APPENDECTOMY     ATRIAL FIBRILLATION ABLATION N/A 09/28/2017   Procedure: ATRIAL FIBRILLATION ABLATION;  Surgeon: [name], MD;  Location: [place] [facility];  Service: Cardiovascular;  Laterality: N/A;   BACK SURGERY     x 3 - 2 rods and 8 screws   CARDIOVERSION N/A 08/24/2017   Procedure: CARDIOVERSION;  Surgeon: Jerline Pain, MD;  Location: [place];  Service: Cardiovascular;  Laterality: N/A;   CARDIOVERSION N/A 10/19/2017   Procedure: CARDIOVERSION;  Surgeon: Josue Hector, MD;  Location: [facility] ENDOSCOPY;  Service: Cardiovascular;  Laterality: N/A;   CARDIOVERSION N/A 10/04/2019   Procedure: CARDIOVERSION;  Surgeon: Pixie Casino, MD;  Location: [facility] ENDOSCOPY;  Service: Cardiovascular;  Laterality: N/A;   COLONOSCOPY     CYSTOCELE REPAIR     DENTAL SURGERY     infected tooth - general   DILATION AND CURETTAGE OF UTERUS     x several   ELECTROPHYSIOLOGIC STUDY N/A 11/28/2015   Procedure: Atrial Fibrillation Ablation;  Surgeon: [name], MD;  Location: [place] [facility];  Service: Cardiovascular;  Laterality: N/A;   EP IMPLANTABLE DEVICE N/A 06/08/2016   Procedure: Loop Recorder Insertion;  Surgeon: [name], MD;  Location: [place] [facility];  Service: Cardiovascular;  Laterality: N/A;   EXAMINATION UNDER ANESTHESIA N/A 01/15/2014   Procedure: EXAM UNDER ANESTHESIA with evacuation of hematoma and over sewing of vaginal mucosa.;  Surgeon: Floyce Stakes. Pamala Hurry, MD;  Location: [place] [facility];  Service: Gynecology;  Laterality: N/A;   HAND SURGERY     left   hemorrhoid injection  04/2011   JOINT REPLACEMENT     left thumb replacement   MYRINGOTOMY WITH TUBE PLACEMENT Right 10/27/2018   TEE WITHOUT CARDIOVERSION N/A 09/27/2017   Procedure: TRANSESOPHAGEAL ECHOCARDIOGRAM (TEE);  Surgeon: Sueanne Margarita, MD;  Location: [facility] ENDOSCOPY;  Service: Cardiovascular;  Laterality: N/A;   TONSILLECTOMY        Current Outpatient Medications  Medication Sig Dispense Refill   acetaminophen (TYLENOL) 500 MG tablet Take 500-1,000 mg by mouth every 6 (six) hours as needed for moderate pain or headache.     Biotin 5 MG CAPS Take 5 mg by mouth daily with lunch. 2500 mcg     Calcium Carb-Cholecalciferol (CALCIUM 600 + D PO) Take 1 tablet by mouth daily with lunch. 600 mg / 20 mcg     diltiazem (CARDIZEM CD) 300 MG 24 hr capsule Take 1 capsule (300 mg total) by mouth daily. 90 capsule 3   diltiazem (CARDIZEM) 30 MG tablet TAKE 1 TABLET BY MOUTH EVERY 4 HOURS AS NEEDED (FOR FAST HEART RATE > 100 AS LONG AS BP > 100). 135 tablet 1   ELIQUIS 5 MG  TABS tablet TAKE 1 TABLET BY MOUTH TWICE A DAY 180 tablet 1   famotidine (PEPCID) 10 MG tablet Take 10 mg by mouth 2 (two) times daily as needed for heartburn or indigestion.     flecainide (TAMBOCOR) 50 MG tablet TAKE 1 TABLET BY MOUTH TWICE A DAY 180 tablet 2   levothyroxine (SYNTHROID, LEVOTHROID) 75 MCG tablet Take 75 mcg by mouth daily before breakfast.     PARoxetine (PAXIL) 30 MG tablet Take 30 mg by mouth daily.     Probiotic Product (PROBIOTIC PO) Take 1 capsule by mouth daily with lunch.     psyllium (METAMUCIL) 58.6 % powder Take 1 packet by mouth 4 (four) times a week.      ramipril (ALTACE) 10 MG capsule Take 10 mg by mouth 2 (two) times daily.      rosuvastatin (CRESTOR) 10 MG tablet Take 1 tablet (10 mg total) by mouth daily. 90 tablet 3   sodium chloride (OCEAN) 0.65 % SOLN nasal spray Place 2 sprays into both nostrils daily as needed for congestion.      vitamin B-12 (CYANOCOBALAMIN) 1000 MCG tablet Take 1,000 mcg by mouth daily with lunch.     No current facility-administered medications for this encounter.    Allergies:   Pneumovax 23 [pneumococcal vac polyvalent], Codeine, Cephalexin, Ciprofloxacin, Sudafed [pseudoephedrine hcl], and Sulfa antibiotics   Social History:  The patient  reports that she has never smoked. She has never used  smokeless tobacco. She reports that she does not drink alcohol and does not use drugs.   Family History:  The patient's  family history includes Diabetes in her father; Heart attack in her mother and sister; Heart disease in her brother, mother, and sister; Heart failure in her brother; Hypertension in her father and sister; Stroke in her father; Sudden death in her maternal grandfather.    ROS:  Please see the history of present illness.   All other systems are personally reviewed and negative.   Exam: GEN- The patient is well appearing, alert and oriented x 3 today.   Head- normocephalic, atraumatic Eyes-  Sclera clear, conjunctiva pink Ears- hearing intact Oropharynx- clear Neck- supple, no JVP Lymph- no cervical lymphadenopathy Lungs- Clear to ausculation bilaterally, normal work of breathing Heart- regular rate and rhythm, no murmurs, rubs or gallops, PMI not laterally displaced GI- soft, NT, ND, + BS Extremities- no clubbing, cyanosis, or edema MS- no significant deformity or atrophy Skin- no rash or lesion Psych- euthymic mood, full affect Neuro- strength and sensation are intact    EKG: Sinus rhythm at  62 bpm, with first degree AVB, pr int 254 ms, qrs int 92 ms, qtc 424 ms    ASSESSMENT AND PLAN:  1.Persisitent symptomatic atrial fibrillation Pt is now back on flecainide 50 mg bid  for increased afib burden after consult with Dr. Rayann Heman last year  Staying in [place]  Doing well back on drug without any afib Continue apixaban 5 mg bid  Continue Cardizem at  300 mg daily  This patients CHA2DS2-VASc Score and unadjusted Ischemic Stroke Rate (% per year) is equal to 4.8 % stroke rate/year from a score of 4  Above score calculated as 1 point each if present [CHF, HTN, DM, Vascular=MI/PAD/Aortic Plaque, Age if 65-74, or Female] Above score calculated as 2 points each if present [Age > 75, or Stroke/TIA/TE]   Follow-up:f/u in afib clinic in 6 months   Current medicines are  reviewed at length with the patient today.  The patient does not have concerns regarding her medicines.  The following changes were made today:  none  Labs/ tests ordered today include: pre procedure cbc/bmet/ covid screen Orders Placed This Encounter  Procedures   CBC   Basic metabolic panel   EKG 90-BPJP      Signed, Roderic Palau NP  07/01/2021 2:09 PM  Afib [facility] [address] [place], [place] [address] [phone]

## 2021-07-23 DIAGNOSIS — Z1389 Encounter for screening for other disorder: Secondary | ICD-10-CM | POA: Diagnosis not present

## 2021-07-23 DIAGNOSIS — D6869 Other thrombophilia: Secondary | ICD-10-CM | POA: Diagnosis not present

## 2021-07-23 DIAGNOSIS — I700 Atherosclerosis of aorta: Secondary | ICD-10-CM | POA: Diagnosis not present

## 2021-07-23 DIAGNOSIS — M542 Cervicalgia: Secondary | ICD-10-CM | POA: Diagnosis not present

## 2021-07-23 DIAGNOSIS — N1831 Chronic kidney disease, stage 3a: Secondary | ICD-10-CM | POA: Diagnosis not present

## 2021-07-23 DIAGNOSIS — I480 Paroxysmal atrial fibrillation: Secondary | ICD-10-CM | POA: Diagnosis not present

## 2021-07-23 DIAGNOSIS — I129 Hypertensive chronic kidney disease with stage 1 through stage 4 chronic kidney disease, or unspecified chronic kidney disease: Secondary | ICD-10-CM | POA: Diagnosis not present

## 2021-07-23 DIAGNOSIS — Z0000 Encounter for general adult medical examination without abnormal findings: Secondary | ICD-10-CM | POA: Diagnosis not present

## 2021-07-23 DIAGNOSIS — F320 Major depressive disorder, single episode, mild: Secondary | ICD-10-CM | POA: Diagnosis not present

## 2021-07-23 DIAGNOSIS — Z79899 Other long term (current) drug therapy: Secondary | ICD-10-CM | POA: Diagnosis not present

## 2021-07-30 DIAGNOSIS — Z79899 Other long term (current) drug therapy: Secondary | ICD-10-CM | POA: Diagnosis not present

## 2021-08-05 ENCOUNTER — Other Ambulatory Visit (HOSPITAL_COMMUNITY): Payer: Self-pay | Admitting: Internal Medicine

## 2021-08-05 NOTE — Telephone Encounter (Signed)
Pt last saw Roderic Palau, NP on 07/01/21, last labs 07/01/21 Creat 1.03, age 87, weight 80.6, based on specified criteria pt is on appropriate dosage of Eliquis 5mg  BID for afib.  Will refill rx.

## 2021-08-06 ENCOUNTER — Other Ambulatory Visit: Payer: Self-pay

## 2021-08-06 ENCOUNTER — Ambulatory Visit (INDEPENDENT_AMBULATORY_CARE_PROVIDER_SITE_OTHER): Payer: Medicare Other | Admitting: Internal Medicine

## 2021-08-06 ENCOUNTER — Encounter: Payer: Self-pay | Admitting: Internal Medicine

## 2021-08-06 ENCOUNTER — Encounter: Payer: Self-pay | Admitting: *Deleted

## 2021-08-06 VITALS — BP 108/66 | HR 111 | Ht 65.0 in | Wt 177.8 lb

## 2021-08-06 DIAGNOSIS — I4819 Other persistent atrial fibrillation: Secondary | ICD-10-CM | POA: Diagnosis not present

## 2021-08-06 DIAGNOSIS — I10 Essential (primary) hypertension: Secondary | ICD-10-CM

## 2021-08-06 DIAGNOSIS — I440 Atrioventricular block, first degree: Secondary | ICD-10-CM | POA: Diagnosis not present

## 2021-08-06 DIAGNOSIS — I2510 Atherosclerotic heart disease of native coronary artery without angina pectoris: Secondary | ICD-10-CM

## 2021-08-06 HISTORY — PX: OTHER SURGICAL HISTORY: SHX169

## 2021-08-06 NOTE — Patient Instructions (Addendum)
Medication Instructions:  Your physician recommends that you continue on your current medications as directed. Please refer to the Current Medication list given to you today.  Labwork: None ordered.  Testing/Procedures: None ordered.  Follow-Up:  Your physician wants you to follow-up in: Aug 20, 2021 for a Cardioversion at [facility], then a follow up with the Afib Clinic. They will contact you to schedule.      Implantable Loop Recorder Removal, Care After This sheet gives you information about how to care for yourself after your procedure. Your health care provider may also give you more specific instructions. If you have problems or questions, contact your health care provider. What can I expect after the procedure? After the procedure, it is common to have: Soreness or discomfort near the incision. Some swelling or bruising near the incision.  Follow these instructions at home: Incision care   Leave your outer dressing on for 24 hours.  After 24 hours you can remove your outer dressing and shower. Leave adhesive strips in place. These skin closures may need to stay in place for 1-2 weeks. If adhesive strip edges start to loosen and curl up, you may trim the loose edges.  You may remove the strips if they have not fallen off after 2 weeks. Check your incision area every day for signs of infection. Check for: Redness, swelling, or pain. Fluid or blood. Warmth. Pus or a bad smell. Do not take baths, swim, or use a hot tub until your incision is completely healed. If your wound site starts to bleed apply pressure.      If you have any questions/concerns please call the device clinic at [phone].  Activity  Return to your normal activities.  Contact a health care provider if: You have redness, swelling, or pain around your incision. You have a fever.

## 2021-08-06 NOTE — Progress Notes (Signed)
PCP: Lajean Manes, MD Primary Cardiologist: Dr Marlou Porch Primary EP: Dr Rayann Heman  Frances [name] is a 87 y.o. female who presents today for routine electrophysiology followup.  Since last being seen in our clinic, the patient reports doing reasonably well.  Her AF had been controlled.  She developed tachypalpitations about two weeks ago.  She has been in atypical atrial flutter since that time.  She is tolerating this well.  Today, she denies symptoms of chest pain, shortness of breath,  lower extremity edema, dizziness, presyncope, or syncope.  The patient is otherwise without complaint today.   Past Medical History:  Diagnosis Date   Arthritis    hands, back   Asthmatic bronchitis    Atrial fibrillation ([place]) 07/31/2013   Chronic anticoagulation 07/30/2014   Chronic kidney disease    stage 3 renal disease - no med   Dizziness 07/30/2014   Dysrhythmia    Hx - a-fib 05/2010 - tx with meds, no problem since 05/2010   First degree AV block 07/30/2014   Hearing loss    bilateral hearing aids   Hypertension    controlled with meds   Hypothyroidism    Internal hemorrhoid    PAF (paroxysmal atrial fibrillation) ([place]) 11/28/2015   Paroxysmal atrial fibrillation ([facility])    Peripheral vascular disease ([place])    right arm and right shoulder blood clots r/t a fall   Persistent atrial fibrillation ([place]) 09/28/2017   Rectal bleeding    SVD (spontaneous vaginal delivery) 1957, 1959   x 2   Thyroid disease    hypothyroidism   Past Surgical History:  Procedure Laterality Date   ABDOMINAL HYSTERECTOMY     and right ovary   ANTERIOR AND POSTERIOR REPAIR N/A 01/03/2014   Procedure: ANTERIOR (CYSTOCELE) REPAIR;  Surgeon: Claiborne Billings A. Pamala Hurry, MD;  Location: [facility];  Service: Gynecology;  Laterality: N/A;   APPENDECTOMY     ATRIAL FIBRILLATION ABLATION N/A 09/28/2017   Procedure: ATRIAL FIBRILLATION ABLATION;  Surgeon: [name], MD;  Location: [place] [facility];  Service: Cardiovascular;   Laterality: N/A;   BACK SURGERY     x 3 - 2 rods and 8 screws   CARDIOVERSION N/A 08/24/2017   Procedure: CARDIOVERSION;  Surgeon: Jerline Pain, MD;  Location: [place];  Service: Cardiovascular;  Laterality: N/A;   CARDIOVERSION N/A 10/19/2017   Procedure: CARDIOVERSION;  Surgeon: Josue Hector, MD;  Location: [facility] ENDOSCOPY;  Service: Cardiovascular;  Laterality: N/A;   CARDIOVERSION N/A 10/04/2019   Procedure: CARDIOVERSION;  Surgeon: Pixie Casino, MD;  Location: [facility] ENDOSCOPY;  Service: Cardiovascular;  Laterality: N/A;   COLONOSCOPY     CYSTOCELE REPAIR     DENTAL SURGERY     infected tooth - general   DILATION AND CURETTAGE OF UTERUS     x several   ELECTROPHYSIOLOGIC STUDY N/A 11/28/2015   Procedure: Atrial Fibrillation Ablation;  Surgeon: [name], MD;  Location: [place] [facility];  Service: Cardiovascular;  Laterality: N/A;   EP IMPLANTABLE DEVICE N/A 06/08/2016   Procedure: Loop Recorder Insertion;  Surgeon: [name], MD;  Location: [place] [facility];  Service: Cardiovascular;  Laterality: N/A;   EXAMINATION UNDER ANESTHESIA N/A 01/15/2014   Procedure: EXAM UNDER ANESTHESIA with evacuation of hematoma and over sewing of vaginal mucosa.;  Surgeon: Floyce Stakes. Pamala Hurry, MD;  Location: [place] [facility];  Service: Gynecology;  Laterality: N/A;   HAND SURGERY     left   hemorrhoid injection  04/2011   JOINT  REPLACEMENT     left thumb replacement   MYRINGOTOMY WITH TUBE PLACEMENT Right 10/27/2018   TEE WITHOUT CARDIOVERSION N/A 09/27/2017   Procedure: TRANSESOPHAGEAL ECHOCARDIOGRAM (TEE);  Surgeon: Sueanne Margarita, MD;  Location: [facility] ENDOSCOPY;  Service: Cardiovascular;  Laterality: N/A;   TONSILLECTOMY      ROS- all systems are reviewed and negatives except as per HPI above  Current Outpatient Medications  Medication Sig Dispense Refill   acetaminophen (TYLENOL) 500 MG tablet Take 500-1,000 mg by mouth every 6 (six) hours as needed for moderate pain or headache.     apixaban  (ELIQUIS) 5 MG TABS tablet TAKE 1 TABLET BY MOUTH TWICE A DAY 180 tablet 2   Biotin 5 MG CAPS Take 5 mg by mouth daily with lunch. 2500 mcg     Calcium Carb-Cholecalciferol (CALCIUM 600 + D PO) Take 1 tablet by mouth daily with lunch. 600 mg / 20 mcg     diltiazem (CARDIZEM) 30 MG tablet TAKE 1 TABLET BY MOUTH EVERY 4 HOURS AS NEEDED (FOR FAST HEART RATE > 100 AS LONG AS BP > 100). 135 tablet 1   famotidine (PEPCID) 10 MG tablet Take 10 mg by mouth 2 (two) times daily as needed for heartburn or indigestion.     flecainide (TAMBOCOR) 50 MG tablet TAKE 1 TABLET BY MOUTH TWICE A DAY 180 tablet 2   levothyroxine (SYNTHROID, LEVOTHROID) 75 MCG tablet Take 75 mcg by mouth daily before breakfast.     PARoxetine (PAXIL) 30 MG tablet Take 30 mg by mouth daily.     Probiotic Product (PROBIOTIC PO) Take 1 capsule by mouth daily with lunch.     psyllium (METAMUCIL) 58.6 % powder Take 1 packet by mouth 4 (four) times a week.      ramipril (ALTACE) 10 MG capsule Take 10 mg by mouth 2 (two) times daily.      rosuvastatin (CRESTOR) 10 MG tablet Take 1 tablet (10 mg total) by mouth daily. 90 tablet 3   sodium chloride (OCEAN) 0.65 % SOLN nasal spray Place 2 sprays into both nostrils daily as needed for congestion.      vitamin B-12 (CYANOCOBALAMIN) 1000 MCG tablet Take 1,000 mcg by mouth daily with lunch.     diltiazem (CARDIZEM CD) 300 MG 24 hr capsule Take 1 capsule (300 mg total) by mouth daily. 90 capsule 3   No current facility-administered medications for this visit.    Physical Exam: Vitals:   08/06/21 0910  BP: 108/66  Pulse: (!) 111  SpO2: 99%  Weight: 177 lb 12.8 oz (80.6 kg)  Height: 5\' 5"  (1.651 m)    GEN- The patient is well appearing, alert and oriented x 3 today.   Head- normocephalic, atraumatic Eyes-  Sclera clear, conjunctiva pink Ears- hearing intact Oropharynx- clear Lungs- Clear to ausculation bilaterally, normal work of breathing Heart- tachycardic regular rhythm GI- soft,  NT, ND, + BS Extremities- no clubbing, cyanosis, or edema  Wt Readings from Last 3 Encounters:  08/06/21 177 lb 12.8 oz (80.6 kg)  07/01/21 177 lb 12.8 oz (80.6 kg)  03/19/21 174 lb (78.9 kg)    EKG tracing ordered today is personally reviewed and shows atrial flutter (atypical), V rate 11 bpm  Assessment and Plan:  Persistent afib She has done well post ablation She has been in atypical atrial flutter for about 2 weeks.  We discussed options at length today. She would like to proceed with cardioversion.  Risks of cardioversion discussed.  If she  has recurrent arrhythmias in the near future, we will consider repeat ablation. She is on eliquis for chads2vasc score of 5  2. HTN Stable No change required today  3. First degree AVF block Stable No change required today  Her ILR is no longer functional.  She would like to have this removed.  Risks of ILR removal including but not limited to bleeding and infection were discussed.  Risks, benefits and potential toxicities for medications prescribed and/or refilled reviewed with patient today.   [name] MD, [facility] 08/06/2021 9:23 AM     PROCEDURES:   1. Implantable loop recorder explantation     DESCRIPTION OF PROCEDURE:  Informed written consent was obtained.  The patient required no sedation for the procedure today.   The patients left chest was therefore prepped and draped in the usual sterile fashion.  The skin overlying the ILR monitor was infiltrated with lidocaine for local analgesia.  A 0.5-cm incision was made over the site.  The previously implanted ILR was exposed and removed using a combination of sharp and blunt dissection.  Steri- Strips and a sterile dressing were then applied. EBL<72ml.  There were no early apparent complications.     CONCLUSIONS:   1. Successful explantation of a Medtronic Reveal LINQ implantable loop recorder   2. No early apparent complications.      Return to see AF clinic 1 week after  [facility].    [name] MD, [facility] 08/06/2021 9:54 AM

## 2021-08-06 NOTE — H&P (View-Only) (Signed)
PCP: Lajean Manes, MD Primary Cardiologist: Dr Marlou Porch Primary EP: Dr Rayann Heman  Frances [name] is a 87 y.o. female who presents today for routine electrophysiology followup.  Since last being seen in our clinic, the patient reports doing reasonably well.  Her AF had been controlled.  She developed tachypalpitations about two weeks ago.  She has been in atypical atrial flutter since that time.  She is tolerating this well.  Today, she denies symptoms of chest pain, shortness of breath,  lower extremity edema, dizziness, presyncope, or syncope.  The patient is otherwise without complaint today.   Past Medical History:  Diagnosis Date   Arthritis    hands, back   Asthmatic bronchitis    Atrial fibrillation ([place]) 07/31/2013   Chronic anticoagulation 07/30/2014   Chronic kidney disease    stage 3 renal disease - no med   Dizziness 07/30/2014   Dysrhythmia    Hx - a-fib 05/2010 - tx with meds, no problem since 05/2010   First degree AV block 07/30/2014   Hearing loss    bilateral hearing aids   Hypertension    controlled with meds   Hypothyroidism    Internal hemorrhoid    PAF (paroxysmal atrial fibrillation) ([place]) 11/28/2015   Paroxysmal atrial fibrillation ([facility])    Peripheral vascular disease ([place])    right arm and right shoulder blood clots r/t a fall   Persistent atrial fibrillation ([place]) 09/28/2017   Rectal bleeding    SVD (spontaneous vaginal delivery) 1957, 1959   x 2   Thyroid disease    hypothyroidism   Past Surgical History:  Procedure Laterality Date   ABDOMINAL HYSTERECTOMY     and right ovary   ANTERIOR AND POSTERIOR REPAIR N/A 01/03/2014   Procedure: ANTERIOR (CYSTOCELE) REPAIR;  Surgeon: Claiborne Billings A. Pamala Hurry, MD;  Location: [facility];  Service: Gynecology;  Laterality: N/A;   APPENDECTOMY     ATRIAL FIBRILLATION ABLATION N/A 09/28/2017   Procedure: ATRIAL FIBRILLATION ABLATION;  Surgeon: [name], MD;  Location: [place] [facility];  Service: Cardiovascular;   Laterality: N/A;   BACK SURGERY     x 3 - 2 rods and 8 screws   CARDIOVERSION N/A 08/24/2017   Procedure: CARDIOVERSION;  Surgeon: Jerline Pain, MD;  Location: [place];  Service: Cardiovascular;  Laterality: N/A;   CARDIOVERSION N/A 10/19/2017   Procedure: CARDIOVERSION;  Surgeon: Josue Hector, MD;  Location: [facility] ENDOSCOPY;  Service: Cardiovascular;  Laterality: N/A;   CARDIOVERSION N/A 10/04/2019   Procedure: CARDIOVERSION;  Surgeon: Pixie Casino, MD;  Location: [facility] ENDOSCOPY;  Service: Cardiovascular;  Laterality: N/A;   COLONOSCOPY     CYSTOCELE REPAIR     DENTAL SURGERY     infected tooth - general   DILATION AND CURETTAGE OF UTERUS     x several   ELECTROPHYSIOLOGIC STUDY N/A 11/28/2015   Procedure: Atrial Fibrillation Ablation;  Surgeon: [name], MD;  Location: [place] [facility];  Service: Cardiovascular;  Laterality: N/A;   EP IMPLANTABLE DEVICE N/A 06/08/2016   Procedure: Loop Recorder Insertion;  Surgeon: [name], MD;  Location: [place] [facility];  Service: Cardiovascular;  Laterality: N/A;   EXAMINATION UNDER ANESTHESIA N/A 01/15/2014   Procedure: EXAM UNDER ANESTHESIA with evacuation of hematoma and over sewing of vaginal mucosa.;  Surgeon: Floyce Stakes. Pamala Hurry, MD;  Location: [place] [facility];  Service: Gynecology;  Laterality: N/A;   HAND SURGERY     left   hemorrhoid injection  04/2011   JOINT  REPLACEMENT     left thumb replacement   MYRINGOTOMY WITH TUBE PLACEMENT Right 10/27/2018   TEE WITHOUT CARDIOVERSION N/A 09/27/2017   Procedure: TRANSESOPHAGEAL ECHOCARDIOGRAM (TEE);  Surgeon: Sueanne Margarita, MD;  Location: [facility] ENDOSCOPY;  Service: Cardiovascular;  Laterality: N/A;   TONSILLECTOMY      ROS- all systems are reviewed and negatives except as per HPI above  Current Outpatient Medications  Medication Sig Dispense Refill   acetaminophen (TYLENOL) 500 MG tablet Take 500-1,000 mg by mouth every 6 (six) hours as needed for moderate pain or headache.     apixaban  (ELIQUIS) 5 MG TABS tablet TAKE 1 TABLET BY MOUTH TWICE A DAY 180 tablet 2   Biotin 5 MG CAPS Take 5 mg by mouth daily with lunch. 2500 mcg     Calcium Carb-Cholecalciferol (CALCIUM 600 + D PO) Take 1 tablet by mouth daily with lunch. 600 mg / 20 mcg     diltiazem (CARDIZEM) 30 MG tablet TAKE 1 TABLET BY MOUTH EVERY 4 HOURS AS NEEDED (FOR FAST HEART RATE > 100 AS LONG AS BP > 100). 135 tablet 1   famotidine (PEPCID) 10 MG tablet Take 10 mg by mouth 2 (two) times daily as needed for heartburn or indigestion.     flecainide (TAMBOCOR) 50 MG tablet TAKE 1 TABLET BY MOUTH TWICE A DAY 180 tablet 2   levothyroxine (SYNTHROID, LEVOTHROID) 75 MCG tablet Take 75 mcg by mouth daily before breakfast.     PARoxetine (PAXIL) 30 MG tablet Take 30 mg by mouth daily.     Probiotic Product (PROBIOTIC PO) Take 1 capsule by mouth daily with lunch.     psyllium (METAMUCIL) 58.6 % powder Take 1 packet by mouth 4 (four) times a week.      ramipril (ALTACE) 10 MG capsule Take 10 mg by mouth 2 (two) times daily.      rosuvastatin (CRESTOR) 10 MG tablet Take 1 tablet (10 mg total) by mouth daily. 90 tablet 3   sodium chloride (OCEAN) 0.65 % SOLN nasal spray Place 2 sprays into both nostrils daily as needed for congestion.      vitamin B-12 (CYANOCOBALAMIN) 1000 MCG tablet Take 1,000 mcg by mouth daily with lunch.     diltiazem (CARDIZEM CD) 300 MG 24 hr capsule Take 1 capsule (300 mg total) by mouth daily. 90 capsule 3   No current facility-administered medications for this visit.    Physical Exam: Vitals:   08/06/21 0910  BP: 108/66  Pulse: (!) 111  SpO2: 99%  Weight: 177 lb 12.8 oz (80.6 kg)  Height: 5\' 5"  (1.651 m)    GEN- The patient is well appearing, alert and oriented x 3 today.   Head- normocephalic, atraumatic Eyes-  Sclera clear, conjunctiva pink Ears- hearing intact Oropharynx- clear Lungs- Clear to ausculation bilaterally, normal work of breathing Heart- tachycardic regular rhythm GI- soft,  NT, ND, + BS Extremities- no clubbing, cyanosis, or edema  Wt Readings from Last 3 Encounters:  08/06/21 177 lb 12.8 oz (80.6 kg)  07/01/21 177 lb 12.8 oz (80.6 kg)  03/19/21 174 lb (78.9 kg)    EKG tracing ordered today is personally reviewed and shows atrial flutter (atypical), V rate 11 bpm  Assessment and Plan:  Persistent afib She has done well post ablation She has been in atypical atrial flutter for about 2 weeks.  We discussed options at length today. She would like to proceed with cardioversion.  Risks of cardioversion discussed.  If she  has recurrent arrhythmias in the near future, we will consider repeat ablation. She is on eliquis for chads2vasc score of 5  2. HTN Stable No change required today  3. First degree AVF block Stable No change required today  Her ILR is no longer functional.  She would like to have this removed.  Risks of ILR removal including but not limited to bleeding and infection were discussed.  Risks, benefits and potential toxicities for medications prescribed and/or refilled reviewed with patient today.   [name] MD, [facility] 08/06/2021 9:23 AM     PROCEDURES:   1. Implantable loop recorder explantation     DESCRIPTION OF PROCEDURE:  Informed written consent was obtained.  The patient required no sedation for the procedure today.   The patients left chest was therefore prepped and draped in the usual sterile fashion.  The skin overlying the ILR monitor was infiltrated with lidocaine for local analgesia.  A 0.5-cm incision was made over the site.  The previously implanted ILR was exposed and removed using a combination of sharp and blunt dissection.  Steri- Strips and a sterile dressing were then applied. EBL<48ml.  There were no early apparent complications.     CONCLUSIONS:   1. Successful explantation of a Medtronic Reveal LINQ implantable loop recorder   2. No early apparent complications.      Return to see AF clinic 1 week after  [facility].    [name] MD, [facility] 08/06/2021 9:54 AM

## 2021-08-08 ENCOUNTER — Encounter (HOSPITAL_COMMUNITY): Payer: Self-pay | Admitting: Cardiology

## 2021-08-20 ENCOUNTER — Other Ambulatory Visit: Payer: Self-pay

## 2021-08-20 ENCOUNTER — Encounter (HOSPITAL_COMMUNITY): Payer: Self-pay | Admitting: Cardiology

## 2021-08-20 ENCOUNTER — Ambulatory Visit (HOSPITAL_COMMUNITY)
Admission: RE | Admit: 2021-08-20 | Discharge: 2021-08-20 | Disposition: A | Discharge status: Home / Self Care | Payer: Medicare Other | Attending: Cardiology | Admitting: Cardiology

## 2021-08-20 ENCOUNTER — Ambulatory Visit (HOSPITAL_COMMUNITY): Payer: Medicare Other | Admitting: Anesthesiology

## 2021-08-20 ENCOUNTER — Encounter (HOSPITAL_COMMUNITY)
Admission: RE | Disposition: A | Discharge status: Home / Self Care | Payer: Self-pay | Source: Home / Self Care | Attending: Cardiology

## 2021-08-20 DIAGNOSIS — I129 Hypertensive chronic kidney disease with stage 1 through stage 4 chronic kidney disease, or unspecified chronic kidney disease: Secondary | ICD-10-CM | POA: Insufficient documentation

## 2021-08-20 DIAGNOSIS — I10 Essential (primary) hypertension: Secondary | ICD-10-CM

## 2021-08-20 DIAGNOSIS — I4892 Unspecified atrial flutter: Secondary | ICD-10-CM | POA: Diagnosis not present

## 2021-08-20 DIAGNOSIS — Z7901 Long term (current) use of anticoagulants: Secondary | ICD-10-CM | POA: Insufficient documentation

## 2021-08-20 DIAGNOSIS — I4819 Other persistent atrial fibrillation: Secondary | ICD-10-CM | POA: Diagnosis not present

## 2021-08-20 DIAGNOSIS — I484 Atypical atrial flutter: Secondary | ICD-10-CM | POA: Insufficient documentation

## 2021-08-20 DIAGNOSIS — I2510 Atherosclerotic heart disease of native coronary artery without angina pectoris: Secondary | ICD-10-CM | POA: Diagnosis not present

## 2021-08-20 DIAGNOSIS — I440 Atrioventricular block, first degree: Secondary | ICD-10-CM

## 2021-08-20 DIAGNOSIS — N189 Chronic kidney disease, unspecified: Secondary | ICD-10-CM | POA: Diagnosis not present

## 2021-08-20 DIAGNOSIS — N183 Chronic kidney disease, stage 3 unspecified: Secondary | ICD-10-CM | POA: Diagnosis not present

## 2021-08-20 HISTORY — PX: CARDIOVERSION: SHX1299

## 2021-08-20 SURGERY — CARDIOVERSION
Anesthesia: General

## 2021-08-20 MED ORDER — PROPOFOL 10 MG/ML IV BOLUS
INTRAVENOUS | Status: DC | PRN
  Administered 2021-08-20: 50 mg via INTRAVENOUS

## 2021-08-20 MED ORDER — SODIUM CHLORIDE 0.9 % IV SOLN
INTRAVENOUS | Status: AC | PRN
  Administered 2021-08-20: 500 mL via INTRAVENOUS

## 2021-08-20 MED ORDER — LIDOCAINE 2% (20 MG/ML) 5 ML SYRINGE
INTRAMUSCULAR | Status: DC | PRN
  Administered 2021-08-20: 60 mg via INTRAVENOUS

## 2021-08-20 MED ORDER — SODIUM CHLORIDE 0.9 % IV SOLN: INTRAVENOUS | Status: DC

## 2021-08-20 NOTE — Anesthesia Postprocedure Evaluation (Signed)
Anesthesia Post Note  Patient: Frances [name]  Procedure(s) Performed: CARDIOVERSION     Patient location during evaluation: Endoscopy Anesthesia Type: General Level of consciousness: awake and alert Pain management: pain level controlled Vital Signs Assessment: post-procedure vital signs reviewed and stable Respiratory status: spontaneous breathing, nonlabored ventilation, respiratory function stable and patient connected to nasal cannula oxygen Cardiovascular status: blood pressure returned to baseline and stable Postop Assessment: no apparent nausea or vomiting Anesthetic complications: no   No notable events documented.  Last Vitals:  Vitals:   08/20/21 1100 08/20/21 1110  BP: 135/67 126/71  Pulse: 67 66  Resp: 13 13  Temp:    SpO2: 97% 91%    Last Pain:  Vitals:   08/20/21 1110  TempSrc:   PainSc: 0-No pain                 Catalina Gravel

## 2021-08-20 NOTE — Anesthesia Procedure Notes (Signed)
Procedure Name: General with mask airway Date/Time: 08/20/2021 10:44 AM Performed by: Amadeo Garnet, CRNA Pre-anesthesia Checklist: Patient identified, Emergency Drugs available, Suction available and Patient being monitored Patient Re-evaluated:Patient Re-evaluated prior to induction Oxygen Delivery Method: Ambu bag Preoxygenation: Pre-oxygenation with 100% oxygen Induction Type: IV induction Ventilation: Mask ventilation without difficulty Placement Confirmation: positive ETCO2 Dental Injury: Teeth and Oropharynx as per pre-operative assessment

## 2021-08-20 NOTE — Interval H&P Note (Signed)
History and Physical Interval Note:  08/20/2021 10:19 AM  Frances [name]  has presented today for surgery, with the diagnosis of AFLUTTER.  The various methods of treatment have been discussed with the patient and family. After consideration of risks, benefits and other options for treatment, the patient has consented to  Procedure(s): CARDIOVERSION (N/A) as a surgical intervention.  The patient's history has been reviewed, patient examined, no change in status, stable for surgery.  I have reviewed the patient's chart and labs.  Questions were answered to the patient's satisfaction.     [employer]

## 2021-08-20 NOTE — Transfer of Care (Signed)
Immediate Anesthesia Transfer of Care Note  Patient: Frances [name]  Procedure(s) Performed: CARDIOVERSION  Patient Location: Endoscopy Unit  Anesthesia Type:General  Level of Consciousness: drowsy and patient cooperative  Airway & Oxygen Therapy: Patient Spontanous Breathing  Post-op Assessment: Report given to RN, Post -op Vital signs reviewed and stable and Patient moving all extremities  Post vital signs: Reviewed and stable  Last Vitals:  Vitals Value Taken Time  BP    Temp    Pulse    Resp    SpO2      Last Pain:  Vitals:   08/20/21 1029  TempSrc: Temporal  PainSc: 0-No pain         Complications: No notable events documented.

## 2021-08-20 NOTE — Discharge Instructions (Signed)

## 2021-08-20 NOTE — Anesthesia Preprocedure Evaluation (Addendum)
Anesthesia Evaluation  Patient identified by MRN, date of birth, ID band Patient awake    Reviewed: Allergy & Precautions, NPO status , Patient's Chart, lab work & pertinent test results  Airway Mallampati: II  TM Distance: >3 FB Neck ROM: Full    Dental  (+) Teeth Intact, Dental Advisory Given,    Pulmonary asthma ,    Pulmonary exam normal breath sounds clear to auscultation       Cardiovascular hypertension, Pt. on medications + CAD and + Peripheral Vascular Disease  + dysrhythmias Atrial Fibrillation  Rhythm:Irregular Rate:Abnormal     Neuro/Psych negative neurological ROS  negative psych ROS   GI/Hepatic negative GI ROS, Neg liver ROS,   Endo/Other  Hypothyroidism   Renal/GU Renal InsufficiencyRenal disease     Musculoskeletal  (+) Arthritis ,   Abdominal   Peds  Hematology  (+) Blood dyscrasia (Eliquis), ,   Anesthesia Other Findings Day of surgery medications reviewed with the patient.  Reproductive/Obstetrics                            Anesthesia Physical Anesthesia Plan  ASA: 3  Anesthesia Plan: General   Post-op Pain Management:    Induction: Intravenous  PONV Risk Score and Plan: 3 and TIVA and Treatment may vary due to age or medical condition  Airway Management Planned: Mask  Additional Equipment:   Intra-op Plan:   Post-operative Plan:   Informed Consent: I have reviewed the patients History and Physical, chart, labs and discussed the procedure including the risks, benefits and alternatives for the proposed anesthesia with the patient or authorized representative who has indicated his/her understanding and acceptance.     Dental advisory given  Plan Discussed with: CRNA  Anesthesia Plan Comments:         Anesthesia Quick Evaluation

## 2021-08-20 NOTE — CV Procedure (Signed)
° ° °  Electrical Cardioversion Procedure Note Frances [name] [id number] Jun 13, 1934  Procedure: Electrical Cardioversion Indications:  Atrial Flutter  Time Out: Verified patient identification, verified procedure,medications/allergies/relevent history reviewed, required imaging and test results available.  Performed  Procedure Details  The patient was NPO after midnight. Anesthesia was administered at the beside  by Dr.Turk with 50mg  of propofol.  Cardioversion was performed with synchronized biphasic defibrillation via AP pads with 120 joules.  1 attempt(s) were performed.  The patient converted to normal sinus rhythm. The patient tolerated the procedure well   IMPRESSION:  Successful cardioversion of atrial flutter    [name] 08/20/2021, 10:51 AM

## 2021-08-23 ENCOUNTER — Encounter (HOSPITAL_COMMUNITY): Payer: Self-pay | Admitting: Cardiology

## 2021-08-27 ENCOUNTER — Other Ambulatory Visit: Payer: Self-pay

## 2021-08-27 ENCOUNTER — Encounter (HOSPITAL_COMMUNITY): Payer: Self-pay | Admitting: Nurse Practitioner

## 2021-08-27 ENCOUNTER — Ambulatory Visit (HOSPITAL_COMMUNITY)
Admission: RE | Admit: 2021-08-27 | Discharge: 2021-08-27 | Disposition: A | Discharge status: Home / Self Care | Payer: Medicare Other | Source: Ambulatory Visit | Attending: Nurse Practitioner | Admitting: Nurse Practitioner

## 2021-08-27 VITALS — BP 130/68 | HR 65 | Ht 65.0 in | Wt 177.0 lb

## 2021-08-27 DIAGNOSIS — Z79899 Other long term (current) drug therapy: Secondary | ICD-10-CM | POA: Insufficient documentation

## 2021-08-27 DIAGNOSIS — D6869 Other thrombophilia: Secondary | ICD-10-CM | POA: Diagnosis not present

## 2021-08-27 DIAGNOSIS — I4819 Other persistent atrial fibrillation: Secondary | ICD-10-CM | POA: Diagnosis not present

## 2021-08-27 DIAGNOSIS — I480 Paroxysmal atrial fibrillation: Secondary | ICD-10-CM

## 2021-08-27 NOTE — Progress Notes (Signed)
Due to national recommendations of social distancing due to [place] 19, Audio/video telehealth visit is felt to be most appropriate for this patient at this time.  See MyChart message/consent below  from today for patient consent regarding telehealth for the [facility].    Date:  08/27/2021   ID:  Frances [name], DOB July 11, 1934, MRN [id number]  Location: Office  Provider location: [place], [place] [address] Evaluation Performed: f/u persistent afib  PCP:  Lajean Manes, MD  Primary Cardiologist:   Dr. Marlou Porch  Primary Electrophysiologist: Dr. Rayann Heman   CC: persistent afib since last week    History of Present Illness: Frances [name] is a 87 y.o. female who presents  for persistent afib since last week.  Pt with h/o of 2 prior ablation 2017 and most recent 09/2017 that has experienced persistent afib since last week. No known triggers. Her PCP recently increased cardizem for BP issues but she has not started. V rate 129 bpm today. She had several days of afib last May but converted on her own and DCCV was cancelled. Continues on eliquis with a CHA2DS2VASc score of 4.   F/u in afib clinic, 10/11/19, after successful cardioversion.  She remains in SR. She feels improved. Has noted mild ankle edema since increasing cardizem to 300 mg daily. Received her second covid shot this last week.   F/u in afib [facility] 05/27/20. She went into persistent afib one week ago. No change in health,  although she does tell me she loss her husband in April. Her adult son with Parkinson's is living with her since being dx'ed and his wife asking him for a divorce. She has both vacccines and no missed anticoagulation.   F/u in afib clinic 07/08/20. She spontaneously reverted to SR so cardioversion was cancelled. She then had f/u with Dr. Marlou Porch  and was in afib. She was scheduled an appointment with Dr. Rayann Heman and he gave her several options but she wanted to revisit flecainide  and was started on 50 mg bid. She is now in the office and is in SR with flecainide 50 mg bid.. She has a first degree block on flecainide similar  to EKG's on flecainide in the past. She feels well. No afib since start of flecainide.   F/u in afib clinic, 07/01/21. She  remains in SR. No issues with afib. Continues on flecainide 50 mg bid. No issues with Eliquis 5 mg bid for a CHA2DS2VASc  score of 5.   F/u afib clinic, 08/27/21. Pt was seen by Dr. Rayann Heman 10/25 and was found to be in atypical atrial flutter for around 2 weeks. She was set up for cardioversion. Her loop monitor was explanted at that visit. Cardioversion 08/20/21 was successful. She was told if ERAF then he would consider another ablation.   Today, she denies symptoms of palpitations, chest pain, shortness of breath, orthopnea, PND, lower extremity edema, claudication, dizziness, presyncope, syncope, bleeding, or neurologic sequela. The patient is tolerating medications without difficulties and is otherwise without complaint today.   she denies symptoms of cough, fevers, chills, or new SOB worrisome for COVID 19.    she has a BMI of Body mass index is 29.45 kg/m.[facility] Kitchen Filed Weights   08/27/21 1101  Weight: 80.3 kg    Past Medical History:  Diagnosis Date   Arthritis    hands, back   Asthmatic bronchitis    Atrial fibrillation ([facility]) 07/31/2013   Chronic anticoagulation 07/30/2014  Chronic kidney disease    stage 3 renal disease - no med   Dizziness 07/30/2014   Dysrhythmia    Hx - a-fib 05/2010 - tx with meds, no problem since 05/2010   First degree AV block 07/30/2014   Hearing loss    bilateral hearing aids   Hypertension    controlled with meds   Hypothyroidism    Internal hemorrhoid    PAF (paroxysmal atrial fibrillation) ([facility]) 11/28/2015   Paroxysmal atrial fibrillation ([facility])    Peripheral vascular disease ([facility])    right arm and right shoulder blood clots r/t a fall   Persistent atrial fibrillation ([facility]) 09/28/2017    Rectal bleeding    SVD (spontaneous vaginal delivery) 1957, 1959   x 2   Thyroid disease    hypothyroidism   Past Surgical History:  Procedure Laterality Date   ABDOMINAL HYSTERECTOMY     and right ovary   ANTERIOR AND POSTERIOR REPAIR N/A 01/03/2014   Procedure: ANTERIOR (CYSTOCELE) REPAIR;  Surgeon: Claiborne Billings A. Pamala Hurry, MD;  Location: [facility];  Service: Gynecology;  Laterality: N/A;   APPENDECTOMY     ATRIAL FIBRILLATION ABLATION N/A 09/28/2017   Procedure: ATRIAL FIBRILLATION ABLATION;  Surgeon: [name], MD;  Location: [facility];  Service: Cardiovascular;  Laterality: N/A;   BACK SURGERY     x 3 - 2 rods and 8 screws   CARDIOVERSION N/A 08/24/2017   Procedure: CARDIOVERSION;  Surgeon: Jerline Pain, MD;  Location: [facility];  Service: Cardiovascular;  Laterality: N/A;   CARDIOVERSION N/A 10/19/2017   Procedure: CARDIOVERSION;  Surgeon: Josue Hector, MD;  Location: [facility];  Service: Cardiovascular;  Laterality: N/A;   CARDIOVERSION N/A 10/04/2019   Procedure: CARDIOVERSION;  Surgeon: Pixie Casino, MD;  Location: [facility];  Service: Cardiovascular;  Laterality: N/A;   CARDIOVERSION N/A 08/20/2021   Procedure: CARDIOVERSION;  Surgeon: Jerline Pain, MD;  Location: [facility];  Service: Cardiovascular;  Laterality: N/A;   COLONOSCOPY     CYSTOCELE REPAIR     DENTAL SURGERY     infected tooth - general   DILATION AND CURETTAGE OF UTERUS     x several   ELECTROPHYSIOLOGIC STUDY N/A 11/28/2015   Procedure: Atrial Fibrillation Ablation;  Surgeon: [name], MD;  Location: [facility];  Service: Cardiovascular;  Laterality: N/A;   EP IMPLANTABLE DEVICE N/A 06/08/2016   Procedure: Loop Recorder Insertion;  Surgeon: [name], MD;  Location: [facility];  Service: Cardiovascular;  Laterality: N/A;   EXAMINATION UNDER ANESTHESIA N/A 01/15/2014   Procedure: EXAM UNDER ANESTHESIA with evacuation of hematoma and over sewing of vaginal  mucosa.;  Surgeon: Floyce Stakes. Pamala Hurry, MD;  Location: [facility];  Service: Gynecology;  Laterality: N/A;   HAND SURGERY     left   hemorrhoid injection  04/2011   implantable loop recorder removal  08/06/2021   MDT Reveal LINQ removed by Dr Rayann Heman   JOINT REPLACEMENT     left thumb replacement   MYRINGOTOMY WITH TUBE PLACEMENT Right 10/27/2018   TEE WITHOUT CARDIOVERSION N/A 09/27/2017   Procedure: TRANSESOPHAGEAL ECHOCARDIOGRAM (TEE);  Surgeon: Sueanne Margarita, MD;  Location: [facility];  Service: Cardiovascular;  Laterality: N/A;   TONSILLECTOMY       Current Outpatient Medications  Medication Sig Dispense Refill   acetaminophen (TYLENOL) 500 MG tablet Take 500-1,000 mg by mouth every 6 (six) hours as needed for moderate pain or headache.     apixaban (ELIQUIS) 5  MG TABS tablet TAKE 1 TABLET BY MOUTH TWICE A DAY 180 tablet 2   Biotin 2500 MCG CAPS Take 1 capsule by mouth daily with lunch.     Calcium Carb-Cholecalciferol (CALCIUM 600 + D PO) Take 1 tablet by mouth daily with lunch. 600 mg / 20 mcg     diclofenac Sodium (VOLTAREN) 1 % GEL as needed.     diltiazem (CARDIZEM CD) 300 MG 24 hr capsule Take 300 mg by mouth daily.     diltiazem (CARDIZEM) 30 MG tablet TAKE 1 TABLET BY MOUTH EVERY 4 HOURS AS NEEDED (FOR FAST HEART RATE > 100 AS LONG AS BP > 100). 135 tablet 1   famotidine (PEPCID) 10 MG tablet Take 10 mg by mouth 2 (two) times daily as needed for heartburn or indigestion.     flecainide (TAMBOCOR) 50 MG tablet TAKE 1 TABLET BY MOUTH TWICE A DAY 180 tablet 2   hydrocortisone (ANUSOL-HC) 2.5 % rectal cream as needed.     levothyroxine (SYNTHROID, LEVOTHROID) 75 MCG tablet Take 75 mcg by mouth daily before breakfast.     pantoprazole (PROTONIX) 40 MG tablet daily.     PARoxetine (PAXIL) 30 MG tablet Take 30 mg by mouth daily.     Probiotic Product (PROBIOTIC PO) Take 1 capsule by mouth daily with lunch.     psyllium (METAMUCIL) 58.6 % powder Take 1 packet by mouth 4 (four) times  a week.      ramipril (ALTACE) 10 MG capsule Take 10 mg by mouth 2 (two) times daily.      rosuvastatin (CRESTOR) 10 MG tablet Take 1 tablet (10 mg total) by mouth daily. 90 tablet 3   vitamin B-12 (CYANOCOBALAMIN) 1000 MCG tablet Take 1,000 mcg by mouth daily with lunch.     No current facility-administered medications for this encounter.    Allergies:   Pneumovax 23 [pneumococcal vac polyvalent], Codeine, Cephalexin, Ciprofloxacin, Sudafed [pseudoephedrine hcl], and Sulfa antibiotics   Social History:  The patient  reports that she has never smoked. She has never used smokeless tobacco. She reports that she does not drink alcohol and does not use drugs.   Family History:  The patient's  family history includes Diabetes in her father; Heart attack in her mother and sister; Heart disease in her brother, mother, and sister; Heart failure in her brother; Hypertension in her father and sister; Stroke in her father; Sudden death in her maternal grandfather.    ROS:  Please see the history of present illness.   All other systems are personally reviewed and negative.   Exam: GEN- The patient is well appearing, alert and oriented x 3 today.   Head- normocephalic, atraumatic Eyes-  Sclera clear, conjunctiva pink Ears- hearing intact Oropharynx- clear Neck- supple, no JVP Lymph- no cervical lymphadenopathy Lungs- Clear to ausculation bilaterally, normal work of breathing Heart- regular rate and rhythm, no murmurs, rubs or gallops, PMI not laterally displaced GI- soft, NT, ND, + BS Extremities- no clubbing, cyanosis, or edema MS- no significant deformity or atrophy Skin- no rash or lesion Psych- euthymic mood, full affect Neuro- strength and sensation are intact    EKG: Sinus rhythm  with first degree AV block, 65 bpm, pr int 244 ms, qrs int 94 ms, qtc 463 ms    ASSESSMENT AND PLAN:  1.Persisitent symptomatic atrial fibrillation Recent successful cardioversion  Continue flecainide 50  mg bid  Staying in SR so far  Dr. Rayann Heman mentioned to pt if ERAF, he would consider  another ablation  Continue apixaban 5 mg bid  Continue Cardizem at  300 mg daily  This patients CHA2DS2-VASc Score and unadjusted Ischemic Stroke Rate (% per year) is equal to 4.8 % stroke rate/year from a score of 4  Above score calculated as 1 point each if present [CHF, HTN, DM, Vascular=MI/PAD/Aortic Plaque, Age if 65-74, or Female] Above score calculated as 2 points each if present [Age > 75, or Stroke/TIA/TE]   Follow-up:f/u in afib clinic in 6 months   Current medicines are reviewed at length with the patient today.   The patient does not have concerns regarding her medicines.  The following changes were made today:  none  Labs/ tests ordered today include: pre procedure cbc/bmet/ covid screen Orders Placed This Encounter  Procedures   EKG 12-Lead   F/u with Dr. Marlou Porch per appointment in January and I will see in July    SignedRoderic Palau NP  08/27/2021 11:23 AM  Afib [facility] [address] [place], [place] [address] [phone]

## 2021-09-03 ENCOUNTER — Other Ambulatory Visit: Payer: Self-pay | Admitting: Cardiology

## 2021-10-03 ENCOUNTER — Ambulatory Visit (INDEPENDENT_AMBULATORY_CARE_PROVIDER_SITE_OTHER): Payer: Medicare Other | Admitting: Cardiology

## 2021-10-03 ENCOUNTER — Other Ambulatory Visit: Payer: Self-pay

## 2021-10-03 ENCOUNTER — Encounter: Payer: Self-pay | Admitting: Cardiology

## 2021-10-03 VITALS — BP 130/70 | HR 71 | Ht 65.0 in | Wt 179.0 lb

## 2021-10-03 DIAGNOSIS — I10 Essential (primary) hypertension: Secondary | ICD-10-CM | POA: Diagnosis not present

## 2021-10-03 DIAGNOSIS — I480 Paroxysmal atrial fibrillation: Secondary | ICD-10-CM

## 2021-10-03 DIAGNOSIS — I440 Atrioventricular block, first degree: Secondary | ICD-10-CM

## 2021-10-03 DIAGNOSIS — I484 Atypical atrial flutter: Secondary | ICD-10-CM

## 2021-10-03 DIAGNOSIS — D6869 Other thrombophilia: Secondary | ICD-10-CM

## 2021-10-03 DIAGNOSIS — I4891 Unspecified atrial fibrillation: Secondary | ICD-10-CM | POA: Diagnosis not present

## 2021-10-03 NOTE — Assessment & Plan Note (Signed)
Last episode was atrial flutter.  Successful cardioversion.  She developed atrial flutter while on the flecainide.

## 2021-10-03 NOTE — Assessment & Plan Note (Signed)
Mild on EKG.  Continue to monitor.  She is on diltiazem 300 daily.

## 2021-10-03 NOTE — Assessment & Plan Note (Signed)
Currently in sinus rhythm.  On flecainide 50 mg twice a day.  If flutter or fib were to return, we will have her talk to EP again for potential ablation.

## 2021-10-03 NOTE — Assessment & Plan Note (Signed)
Continue with Eliquis.  No bleeding.  High risk medication lab monitoring.  Last hemoglobin 13.6 creatinine 1.0.

## 2021-10-03 NOTE — Patient Instructions (Signed)
Medication Instructions:  The current medical regimen is effective;  continue present plan and medications.  *If you need a refill on your cardiac medications before your next appointment, please call your pharmacy*  Follow-Up: At [facility], you and your health needs are our priority.  As part of our continuing mission to provide you with exceptional heart care, we have created designated Provider Care Teams.  These Care Teams include your primary Cardiologist (physician) and Advanced Practice Providers (APPs -  Physician Assistants and Nurse Practitioners) who all work together to provide you with the care you need, when you need it.  We recommend signing up for the patient portal called "MyChart".  Sign up information is provided on this After Visit Summary.  MyChart is used to connect with patients for Virtual Visits (Telemedicine).  Patients are able to view lab/test results, encounter notes, upcoming appointments, etc.  Non-urgent messages can be sent to your provider as well.   To learn more about what you can do with MyChart, go to [email or web address].    Your next appointment:   6 month(s)  The format for your next appointment:   In Person  Provider:   You will follow up in the [facility] located at [facility]. Your provider will be: Roderic Palau, [facility] And 1 year   Thank you for choosing [facility]!!

## 2021-10-03 NOTE — Progress Notes (Signed)
Cardiology Office Note:    Date:  10/03/2021   ID:  Frances [name], DOB [birth year], MRN [id number]  PCP:  Frances Manes, MD   [facility] Providers Cardiologist:  Frances Furbish, MD     Referring MD: Frances Manes, MD    History of Present Illness:    Frances [name] is a 88 y.o. female here for the follow-up of atrial fibrillation.  She has had persistent atrial fibrillation and was seen in the atrial fibrillation clinic last in December 2022.  To prior ablation 2017 and 2019.  Back in May 2021 had several days of A. fib but converted on her own without cardioversion.  CHA2DS2-VASc 4 4.  Back when she saw Dr. Rayann [name] last October she was found to have an atypical flutter for about 2 weeks.  Loop monitor was explanted.  Cardioversion in August 20, 2021 was successful.  If this returned, she was told that she would consider another ablation.  She is back on her flecainide 50 mg twice a day, low-dose.  Paroxetine and flecainide have a subtle interaction.  Her EKG today is unremarkable, no QRS widening.  QT 426.  No fevers chills nausea vomiting syncope bleeding  She is having some knee [name].  Right knee.  She is going to see Dr. Alvan Dame  Past Medical History:  Diagnosis Date   Arthritis    hands, back   Asthmatic bronchitis    Atrial fibrillation ([place]) 07/31/2013   Chronic anticoagulation 07/30/2014   Chronic kidney disease    stage 3 renal disease - no med   Dizziness 07/30/2014   Dysrhythmia    Hx - a-fib 05/2010 - tx with meds, no problem since 05/2010   First degree AV block 07/30/2014   Hearing loss    bilateral hearing aids   Hypertension    controlled with meds   Hypothyroidism    Internal hemorrhoid    PAF (paroxysmal atrial fibrillation) ([place]) 11/28/2015   Paroxysmal atrial fibrillation (HCC)    Peripheral vascular disease ([place])    right arm and right shoulder blood clots r/t a fall   Persistent atrial fibrillation ([place]) 09/28/2017   Rectal bleeding     SVD (spontaneous vaginal delivery) 1957, 1959   x 2   Thyroid disease    hypothyroidism    Past Surgical History:  Procedure Laterality Date   ABDOMINAL HYSTERECTOMY     and right ovary   ANTERIOR AND POSTERIOR REPAIR N/A 01/03/2014   Procedure: ANTERIOR (CYSTOCELE) REPAIR;  Surgeon: Frances Billings A. Pamala Hurry, MD;  Location: [place] [facility];  Service: Gynecology;  Laterality: N/A;   APPENDECTOMY     ATRIAL FIBRILLATION ABLATION N/A 09/28/2017   Procedure: ATRIAL FIBRILLATION ABLATION;  Surgeon: Frances Grayer, MD;  Location: [place] [facility];  Service: Cardiovascular;  Laterality: N/A;   BACK SURGERY     x 3 - 2 rods and 8 screws   CARDIOVERSION N/A 08/24/2017   Procedure: CARDIOVERSION;  Surgeon: Frances Pain, MD;  Location: [place];  Service: Cardiovascular;  Laterality: N/A;   CARDIOVERSION N/A 10/19/2017   Procedure: CARDIOVERSION;  Surgeon: Frances Hector, MD;  Location: [facility] ENDOSCOPY;  Service: Cardiovascular;  Laterality: N/A;   CARDIOVERSION N/A 10/04/2019   Procedure: CARDIOVERSION;  Surgeon: Frances Casino, MD;  Location: [place];  Service: Cardiovascular;  Laterality: N/A;   CARDIOVERSION N/A 08/20/2021   Procedure: CARDIOVERSION;  Surgeon: Frances Pain, MD;  Location: [facility] ENDOSCOPY;  Service: Cardiovascular;  Laterality: N/A;   COLONOSCOPY  CYSTOCELE REPAIR     DENTAL SURGERY     infected tooth - general   DILATION AND CURETTAGE OF UTERUS     x several   ELECTROPHYSIOLOGIC STUDY N/A 11/28/2015   Procedure: Atrial Fibrillation Ablation;  Surgeon: Frances Grayer, MD;  Location: [place] [facility];  Service: Cardiovascular;  Laterality: N/A;   EP IMPLANTABLE DEVICE N/A 06/08/2016   Procedure: Loop Recorder Insertion;  Surgeon: Frances Grayer, MD;  Location: [place] [facility];  Service: Cardiovascular;  Laterality: N/A;   EXAMINATION UNDER ANESTHESIA N/A 01/15/2014   Procedure: EXAM UNDER ANESTHESIA with evacuation of hematoma and over sewing of vaginal mucosa.;  Surgeon:  Frances [name]. Pamala Hurry, MD;  Location: [place] [facility];  Service: Gynecology;  Laterality: N/A;   HAND SURGERY     left   hemorrhoid injection  04/2011   implantable loop recorder removal  08/06/2021   MDT Reveal LINQ removed by Dr Frances [name]   JOINT REPLACEMENT     left thumb replacement   MYRINGOTOMY WITH TUBE PLACEMENT Right 10/27/2018   TEE WITHOUT CARDIOVERSION N/A 09/27/2017   Procedure: TRANSESOPHAGEAL ECHOCARDIOGRAM (TEE);  Surgeon: Frances Margarita, MD;  Location: [facility] ENDOSCOPY;  Service: Cardiovascular;  Laterality: N/A;   TONSILLECTOMY      Current Medications: Current Meds  Medication Sig   acetaminophen (TYLENOL) 500 MG tablet Take 500-1,000 mg by mouth every 6 (six) hours as needed for moderate [name] or headache.   apixaban (ELIQUIS) 5 MG TABS tablet TAKE 1 TABLET BY MOUTH TWICE A DAY   Biotin 2500 MCG CAPS Take 1 capsule by mouth daily with lunch.   Calcium Carb-Cholecalciferol (CALCIUM 600 + D PO) Take 1 tablet by mouth daily with lunch. 600 mg / 20 mcg   diclofenac Sodium (VOLTAREN) 1 % GEL as needed.   diltiazem (CARDIZEM CD) 300 MG 24 hr capsule Take 300 mg by mouth daily.   diltiazem (CARDIZEM) 30 MG tablet TAKE 1 TABLET BY MOUTH EVERY 4 HOURS AS NEEDED (FOR FAST HEART RATE > 100 AS LONG AS BP > 100).   famotidine (PEPCID) 10 MG tablet Take 10 mg by mouth 2 (two) times daily as needed for heartburn or indigestion.   flecainide (TAMBOCOR) 50 MG tablet TAKE 1 TABLET BY MOUTH TWICE A DAY   hydrocortisone (ANUSOL-HC) 2.5 % rectal cream as needed.   levothyroxine (SYNTHROID, LEVOTHROID) 75 MCG tablet Take 75 mcg by mouth daily before breakfast.   PARoxetine (PAXIL) 30 MG tablet Take 30 mg by mouth daily.   Probiotic Product (PROBIOTIC PO) Take 1 capsule by mouth daily with lunch.   psyllium (METAMUCIL) 58.6 % powder Take 1 packet by mouth 4 (four) times a week.    ramipril (ALTACE) 10 MG capsule Take 10 mg by mouth 2 (two) times daily.    rosuvastatin (CRESTOR) 10 MG tablet TAKE 1 TABLET BY  MOUTH EVERY DAY   vitamin B-12 (CYANOCOBALAMIN) 1000 MCG tablet Take 1,000 mcg by mouth daily with lunch.     Allergies:   Pneumovax 23 [pneumococcal vac polyvalent], Codeine, Cephalexin, Ciprofloxacin, Sudafed [pseudoephedrine hcl], and Sulfa antibiotics   Social History   Socioeconomic History   Marital status: Married    Spouse name: Not on file   Number of children: Not on file   Years of education: Not on file   Highest education level: Not on file  Occupational History   Not on file  Tobacco Use   Smoking status: Never   Smokeless tobacco: Never  Vaping Use  Vaping Use: Never used  Substance and Sexual Activity   Alcohol use: No   Drug use: No   Sexual activity: Not Currently    Birth control/protection: Post-menopausal  Other Topics Concern   Not on file  Social History Narrative   Not on file   Social Determinants of Health   Financial Resource Strain: Not on file  Food Insecurity: Not on file  Transportation Needs: Not on file  Physical Activity: Not on file  Stress: Not on file  Social Connections: Not on file     Family History: The patient's family history includes Diabetes in her father; Heart attack in her mother and sister; Heart disease in her brother, mother, and sister; Heart failure in her brother; Hypertension in her father and sister; Stroke in her father; Sudden death in her maternal grandfather.  ROS:   Please see the history of present illness.     All other systems reviewed and are negative.  EKGs/Labs/Other Studies Reviewed:    EKG:  EKG is  ordered today.  The ekg ordered today demonstrates sinus rhythm 71, PR interval 238 QT 426 mild first-degree AV block  Recent Labs: 12/11/2020: ALT 13 07/01/2021: BUN 18; Creatinine, Ser 1.03; Hemoglobin 12.9; Platelets 219; Potassium 4.7; Sodium 137  Recent Lipid Panel    Component Value Date/Time   CHOL 126 12/11/2020 0919   TRIG 110 12/11/2020 0919   HDL 61 12/11/2020 0919   CHOLHDL 2.1  12/11/2020 0919   LDLCALC 45 12/11/2020 0919     Risk Assessment/Calculations:              Physical Exam:    VS:  BP 130/70 (BP Location: Left Arm, Patient Position: Sitting, Cuff Size: Normal)    Pulse 71    Ht 5\' 5"  (1.651 m)    Wt 179 lb (81.2 kg)    LMP  (LMP Unknown)    SpO2 98%    BMI 29.79 kg/m     Wt Readings from Last 3 Encounters:  10/03/21 179 lb (81.2 kg)  08/27/21 177 lb (80.3 kg)  08/20/21 177 lb 12.8 oz (80.7 kg)     GEN:  Well nourished, well developed in no acute distress HEENT: Normal NECK: No JVD; No carotid bruits LYMPHATICS: No lymphadenopathy CARDIAC: RRR, no murmurs, no rubs, gallops RESPIRATORY:  Clear to auscultation without rales, wheezing or rhonchi  ABDOMEN: Soft, non-tender, non-distended MUSCULOSKELETAL:  No edema; No deformity  SKIN: Warm and dry NEUROLOGIC:  Alert and oriented x 3 PSYCHIATRIC:  Normal affect   ASSESSMENT:    1. PAF (paroxysmal atrial fibrillation) ([place])   2. Essential hypertension   3. First degree AV block   4. Atypical atrial flutter ([place])   5. Hypercoagulable state due to atrial fibrillation (HCC)    PLAN:    In order of problems listed above:  PAF (paroxysmal atrial fibrillation) (HCC) Currently in sinus rhythm.  On flecainide 50 mg twice a day.  If flutter or fib were to return, we will have her talk to EP again for potential ablation.  First degree AV block Mild on EKG.  Continue to monitor.  She is on diltiazem 300 daily.  Atrial flutter ([place]) Last episode was atrial flutter.  Successful cardioversion.  She developed atrial flutter while on the flecainide.  Hypercoagulable state due to atrial fibrillation ([place]) Continue with Eliquis.  No bleeding.  High risk medication lab monitoring.  Last hemoglobin 13.6 creatinine 1.0.         Medication  Adjustments/Labs and Tests Ordered: Current medicines are reviewed at length with the patient today.  Concerns regarding medicines are outlined above.   Orders Placed This Encounter  Procedures   EKG 12-Lead   No orders of the defined types were placed in this encounter.   Patient Instructions  Medication Instructions:  The current medical regimen is effective;  continue present plan and medications.  *If you need a refill on your cardiac medications before your next appointment, please call your pharmacy*  Follow-Up: At [facility], you and your health needs are our priority.  As part of our continuing mission to provide you with exceptional heart care, we have created designated Provider Care Teams.  These Care Teams include your primary Cardiologist (physician) and Advanced Practice Providers (APPs -  Physician Assistants and Nurse Practitioners) who all work together to provide you with the care you need, when you need it.  We recommend signing up for the patient portal called "MyChart".  Sign up information is provided on this After Visit Summary.  MyChart is used to connect with patients for Virtual Visits (Telemedicine).  Patients are able to view lab/test results, encounter notes, upcoming appointments, etc.  Non-urgent messages can be sent to your provider as well.   To learn more about what you can do with MyChart, go to [email or web address].    Your next appointment:   6 month(s)  The format for your next appointment:   In Person  Provider:   You will follow up in the [facility] located at [facility]. Your provider will be: Roderic Palau, NP{ And 1 year   Thank you for choosing [facility]!!      Signed, Frances Furbish, MD  10/03/2021 10:52 AM    [place]

## 2021-10-21 ENCOUNTER — Other Ambulatory Visit: Payer: Self-pay | Admitting: Geriatric Medicine

## 2021-10-21 ENCOUNTER — Ambulatory Visit
Admission: RE | Admit: 2021-10-21 | Discharge: 2021-10-21 | Disposition: A | Discharge status: Home / Self Care | Payer: Medicare Other | Source: Ambulatory Visit | Attending: Geriatric Medicine | Admitting: Geriatric Medicine

## 2021-10-21 DIAGNOSIS — M542 Cervicalgia: Secondary | ICD-10-CM | POA: Diagnosis not present

## 2021-10-21 DIAGNOSIS — M47812 Spondylosis without myelopathy or radiculopathy, cervical region: Secondary | ICD-10-CM | POA: Diagnosis not present

## 2021-11-10 ENCOUNTER — Ambulatory Visit: Payer: Medicare Other | Attending: Geriatric Medicine

## 2021-11-10 ENCOUNTER — Other Ambulatory Visit: Payer: Self-pay

## 2021-11-10 DIAGNOSIS — M542 Cervicalgia: Secondary | ICD-10-CM | POA: Insufficient documentation

## 2021-11-10 DIAGNOSIS — R293 Abnormal posture: Secondary | ICD-10-CM | POA: Insufficient documentation

## 2021-11-10 DIAGNOSIS — M6281 Muscle weakness (generalized): Secondary | ICD-10-CM | POA: Insufficient documentation

## 2021-11-10 NOTE — Therapy (Signed)
OUTPATIENT PHYSICAL THERAPY CERVICAL EVALUATION   Patient Name: Frances [name] MRN: [id number] DOB:[birth year], 88 y.o., female Today's Date: 11/10/2021   PT End of Session - 11/10/21 1029     Visit Number 1    Number of Visits 17    Date for PT Re-Evaluation 01/05/22    Authorization Type Medicare    PT Start Time 1045    PT Stop Time 1125    PT Time Calculation (min) 40 min    Behavior During Therapy [facility] for tasks assessed/performed             Past Medical History:  Diagnosis Date   Arthritis    hands, back   Asthmatic bronchitis    Atrial fibrillation ([place]) 07/31/2013   Chronic anticoagulation 07/30/2014   Chronic kidney disease    stage 3 renal disease - no med   Dizziness 07/30/2014   Dysrhythmia    Hx - a-fib 05/2010 - tx with meds, no problem since 05/2010   First degree AV block 07/30/2014   Hearing loss    bilateral hearing aids   Hypertension    controlled with meds   Hypothyroidism    Internal hemorrhoid    PAF (paroxysmal atrial fibrillation) ([place]) 11/28/2015   Paroxysmal atrial fibrillation (HCC)    Peripheral vascular disease ([place])    right arm and right shoulder blood clots r/t a fall   Persistent atrial fibrillation ([place]) 09/28/2017   Rectal bleeding    SVD (spontaneous vaginal delivery) 1957, 1959   x 2   Thyroid disease    hypothyroidism   Past Surgical History:  Procedure Laterality Date   ABDOMINAL HYSTERECTOMY     and right ovary   ANTERIOR AND POSTERIOR REPAIR N/A 01/03/2014   Procedure: ANTERIOR (CYSTOCELE) REPAIR;  Surgeon: Claiborne Billings A. Pamala Hurry, MD;  Location: [place] [facility];  Service: Gynecology;  Laterality: N/A;   APPENDECTOMY     ATRIAL FIBRILLATION ABLATION N/A 09/28/2017   Procedure: ATRIAL FIBRILLATION ABLATION;  Surgeon: [name], MD;  Location: [place] [facility];  Service: Cardiovascular;  Laterality: N/A;   BACK SURGERY     x 3 - 2 rods and 8 screws   CARDIOVERSION N/A 08/24/2017   Procedure: CARDIOVERSION;  Surgeon:  Jerline Pain, MD;  Location: [place];  Service: Cardiovascular;  Laterality: N/A;   CARDIOVERSION N/A 10/19/2017   Procedure: CARDIOVERSION;  Surgeon: Josue Hector, MD;  Location[facility] END[facility]COPY;  Service: Cardiovascular;  Laterality: N/A;   CARDIOVERSION N/A 10/04/2019   Procedure: CARDIOVERSION;  Surgeon: Pixie Casino, MD;  Location: [place];  Service: Cardiovascular;  Laterality: N/A;   CARDIOVERSION N/A 08/20/2021   Procedure: CARDIOVERSION;  Surgeon: Jerline Pain, MD;  Location: [place] ENDOSCOPY;  Service: Cardiovascular;  Laterality: N/A;   COLONOSCOPY     CYSTOCELE REPAIR     DENTAL SURGERY     infected tooth - general   DILATION AND CURETTAGE OF UTERUS     x several   ELECTROPHYSIOLOGIC STUDY N/A 11/28/2015   Procedure: Atrial Fibrillation Ablation;  Surgeon: [name], MD;  Location: [place] [facility];  Service: Cardiovascular;  Laterality: N/A;   EP IMPLANTABLE DEVICE N/A 06/08/2016   Procedure: Loop Recorder Insertion;  Surgeon: [name], MD;  Location: [place] [facility];  Service: Cardiovascular;  Laterality: N/A;   EXAMINATION UNDER ANESTHESIA N/A 01/15/2014   Procedure: EXAM UNDER ANESTHESIA with evacuation of hematoma and over sewing of vaginal mucosa.;  Surgeon: Floyce Stakes. Pamala Hurry, MD;  Location[facility];  Service: Gynecology;  Laterality: N/A;   HAND SURGERY     left   hemorrhoid injection  04/2011   implantable loop recorder removal  08/06/2021   MDT Reveal LINQ removed by Dr Rayann Heman   JOINT REPLACEMENT     left thumb replacement   MYRINGOTOMY WITH TUBE PLACEMENT Right 10/27/2018   TEE WITHOUT CARDIOVERSION N/A 09/27/2017   Procedure: TRANSESOPHAGEAL ECHOCARDIOGRAM (TEE);  Surgeon: Sueanne Margarita, MD;  Location: [facility] ENDOSCOPY;  Service: Cardiovascular;  Laterality: N/A;   TONSILLECTOMY     Patient Active Problem List   Diagnosis Date Noted   Hypercoagulable state due to atrial fibrillation ([place]) 10/03/2021   Coronary artery disease involving  native coronary artery of native heart without angina pectoris 09/12/2020   Atrial flutter (HCC)    Persistent atrial fibrillation ([place]) 09/28/2017   PAF (paroxysmal atrial fibrillation) ([place]) 11/28/2015   First degree AV block 07/30/2014   Chronic anticoagulation 07/30/2014   Dizziness 07/30/2014   Post-op bleeding 01/15/2014   Cystocele 01/03/2014   Atrial fibrillation ([place]) 07/31/2013   Hypertension    Thyroid disease    Chronic kidney disease    Internal hemorrhoid    Rectal bleeding    Asthmatic bronchitis     PCP: Lajean Manes, MD  REFERRING PROVIDER: Lajean Manes, MD  REFERRING DIAG: M54.2 (ICD-10-CM) - Cervicalgia  THERAPY DIAG:  Cervicalgia  Muscle weakness (generalized)  Abnormal posture  ONSET DATE: February 2023  SUBJECTIVE:                                                                                                                                                                                                         SUBJECTIVE STATEMENT: Pt presents to PT with reports of neck pain and discomfort. No initial MOI, but recently had increased pain when reaching into her cabinet to turn off her water to the kitchen sink. She has had R sided neck pain on and off since then. Denies paresthesia, pain does not go past AC joint. She also reports headaches in R temporal region, with pt noting symptoms increase with increase pain.   PERTINENT HISTORY:  Afib, HTN  PAIN:  Are you having pain? Yes NPRS scale: 2/10 (6/10 at worst) Pain location: R side of neck PAIN TYPE: sharp Pain description: intermittent  Aggravating factors: lying on R side, neck rotation Relieving factors: heat, Voltaren   PRECAUTIONS: None  WEIGHT BEARING RESTRICTIONS No  FALLS:  Has patient fallen in last 6 months? No Number of falls: N/A  LIVING ENVIRONMENT: Lives with: lives with  their family Lives in: House/apartment Stairs: Yes; Internal: 7 steps; on right going up Has  following equipment at home: None  OCCUPATION: Retired  PLOF: Independent and Independent with basic ADLs  PATIENT GOALS: decrease neck pain in order to improve comfort with home ADLs   OBJECTIVE:   VITALS:  BP: 116/70  DIAGNOSTIC FINDINGS:  CLINICAL DATA:  Right-sided mid neck pain radiating down to shoulder for 6-9 months.   EXAM: CERVICAL SPINE - COMPLETE 4+ VIEW   COMPARISON:  None.   FINDINGS: No acute fracture or malalignment. Straightening of the normal cervical lordosis due to degenerative spondylosis at C5-C6. Disc space narrowing and small posterior disc osteophyte complex.   IMPRESSION: Localized degenerative cervical spondylosis at C5-C6 resulting in straightening of the normal cervical lordosis.   No evidence of acute fracture, malalignment or bony lesion.    PATIENT SURVEYS:  FOTO 46% function; 55% predicted   COGNITION: Overall cognitive status: Within functional limits for tasks assessed   SENSATION: Light touch: Appears intact  POSTURE:  Medium body habitus; rounded shoulders, fwd head  PALPATION: TTP to R upper trap, R suboccipitals   CERVICAL AROM/PROM  A/PROM A/PROM (deg) 11/10/2021  Flexion   Extension   Right rotation 40 p!  Left rotation 50   (Blank rows = not tested)  UE ROM:  AROM Right 11/10/2021 Left 11/10/2021  Shoulder flexion [facility]  Shoulder extension    Shoulder abduction [facility]  Shoulder internal rotation    Shoulder external rotation    Elbow flexion    Elbow extension    Wrist flexion    Wrist extension     (Blank rows = not tested)  UE MMT:  MMT Right 11/10/2021 Left 11/10/2021  Shoulder flexion [facility]  Shoulder extension    Shoulder abduction [facility]  Shoulder adduction    Shoulder extension    Shoulder internal rotation    Shoulder external rotation    Middle trapezius 3+/5 3+/5  Lower trapezius 3+/5 3+/5  Elbow flexion    Elbow extension    Wrist flexion    Wrist extension    Wrist ulnar  deviation    Wrist radial deviation    Wrist pronation    Wrist supination    Grip strength     (Blank rows = not tested)  CERVICAL SPECIAL TESTS:  Upper limb tension test (ULTT): Negative  FUNCTIONAL TESTS:  N/A  TODAY'S TREATMENT:  [facility] Adult PT Treatment: DATE: 11/10/2021 Therapeutic Exercise: R upper trap stretch x 30" Seated scapular retraction x 10 Seated row x 15 GTB Standing chin tuck x 5 - increased pain, withheld Seated chin tuck x 5 - 5" hold  PATIENT EDUCATION:  Education details: eval findings, FOTO, HEP, POC Person educated: Patient Education method: Explanation, Demonstration, and Handouts Education comprehension: verbalized understanding and returned demonstration   HOME EXERCISE PROGRAM: Access Code: [email or web address] URL: [email or web address] Date: 11/10/2021 Prepared by: Octavio Manns  Exercises Seated Upper Trapezius Stretch - 1 x daily - 7 x weekly - 3 reps - 30 second hold Seated Scapular Retraction - 1 x daily - 7 x weekly - 2 sets - 10 reps Standing Shoulder Row with Anchored Resistance - 1 x daily - 7 x weekly - 3 sets - 10 reps Seated Passive Cervical Retraction - 1 x daily - 7 x weekly - 2 sets - 10 reps - 5 sec hold  ASSESSMENT:  CLINICAL IMPRESSION: Patient is a 88 y.o. F who was seen today for physical therapy  evaluation and treatment for acute on chronic R sided neck pain. Physical findings are consistent with MD impression, as pt demonstrated tenderness to palpation on R sided cervical musculature and decreased neck ROM. She also demonstrated periscapular weakness and postural deficits that are most likely contributing to continued exacerbation of neck pain. Pt would benefit from skilled PT services working on improving DNF and periscapular strength/endurance as well as manual therapy for decreasing pain and improving comfort.  OBJECTIVE IMPAIRMENTS decreased ROM, decreased strength, impaired UE functional use, and pain.   ACTIVITY  LIMITATIONS cleaning, community activity, driving, and yard work.   PERSONAL FACTORS Age, Fitness, and 1-2 comorbidities: Afib, HTN  are also affecting patient's functional outcome.    REHAB POTENTIAL: Excellent  CLINICAL DECISION MAKING: Stable/uncomplicated  EVALUATION COMPLEXITY: Low   GOALS: Goals reviewed with patient? No  SHORT TERM GOALS:  Pt will be compliant and knowledgeable with initial HEP for improved comfort and carryover Baseline: initial HEP given Target date: 12/01/2021 Goal status: INITIAL  2.  Pt will self report R sided neck pain pain no greater than 3/10 for improved comfort and functional ability Baseline: 6/10 at worst Target date: 12/01/2021 Goal status: INITIAL  LONG TERM GOALS:  Pt will improve FOTO function score to no less than 55% as proxy for functional improvement Baseline: 46% function Target date: 01/05/2022 Goal status: INITIAL  2.  Pt will self report R sided neck pain pain no greater than 1/10 for improved comfort and functional ability Baseline:  6/10 at worst Target date: 01/05/2022 Goal status: INITIAL  3.  Pt will improve R cervical rotation to no less than 50 degrees for improved comfort and functional ability Baseline: 40 degrees with pain Target date: 01/05/2022 Goal status: INITIAL  PLAN: PT FREQUENCY: 1-2x/week  PT DURATION: 8 weeks  PLANNED INTERVENTIONS: Therapeutic exercises, Therapeutic activity, Neuromuscular re-education, Balance training, Gait training, Patient/Family education, Joint mobilization, Dry Needling, Cryotherapy, Moist heat, and Manual therapy  PLAN FOR NEXT SESSION: assess HEP response; manual to cervical spine/musculature, discuss TPDN; progress periscapular and DNF strength as able   Ward Chatters, PT 11/10/2021, 12:45 PM

## 2021-11-17 DIAGNOSIS — H66004 Acute suppurative otitis media without spontaneous rupture of ear drum, recurrent, right ear: Secondary | ICD-10-CM | POA: Diagnosis not present

## 2021-11-19 ENCOUNTER — Other Ambulatory Visit: Payer: Self-pay

## 2021-11-19 ENCOUNTER — Ambulatory Visit: Payer: Medicare Other

## 2021-11-19 DIAGNOSIS — M542 Cervicalgia: Secondary | ICD-10-CM | POA: Diagnosis not present

## 2021-11-19 DIAGNOSIS — R293 Abnormal posture: Secondary | ICD-10-CM | POA: Diagnosis not present

## 2021-11-19 DIAGNOSIS — M6281 Muscle weakness (generalized): Secondary | ICD-10-CM | POA: Diagnosis not present

## 2021-11-19 NOTE — Therapy (Signed)
?OUTPATIENT PHYSICAL THERAPY TREATMENT NOTE ? ? ?Patient Name: Frances [name] ?MRN: [id number] ?DOB:15-May-1934, 88 y.o., female ?Today's Date: 11/19/2021 ? ?PCP: Lajean Manes, MD ?REFERRING PROVIDER: Lajean Manes, MD ? ? PT End of Session - 11/19/21 1353   ? ? Visit Number 2   ? Number of Visits 17   ? Date for PT Re-Evaluation 01/05/22   ? Authorization Type Medicare   ? PT Start Time 4098   ? PT Stop Time 1435   ? PT Time Calculation (min) 38 min   ? Behavior During Therapy [facility] for tasks assessed/performed   ? ?  ?  ? ?  ? ? ?Past Medical History:  ?Diagnosis Date  ? Arthritis   ? hands, back  ? Asthmatic bronchitis   ? Atrial fibrillation ([place]) 07/31/2013  ? Chronic anticoagulation 07/30/2014  ? Chronic kidney disease   ? stage 3 renal disease - no med  ? Dizziness 07/30/2014  ? Dysrhythmia   ? Hx - a-fib 05/2010 - tx with meds, no problem since 05/2010  ? First degree AV block 07/30/2014  ? Hearing loss   ? bilateral hearing aids  ? Hypertension   ? controlled with meds  ? Hypothyroidism   ? Internal hemorrhoid   ? PAF (paroxysmal atrial fibrillation) ([place]) 11/28/2015  ? Paroxysmal atrial fibrillation ([facility])   ? Peripheral vascular disease ([place])   ? right arm and right shoulder blood clots r/t a fall  ? Persistent atrial fibrillation ([place]) 09/28/2017  ? Rectal bleeding   ? SVD (spontaneous vaginal delivery) 1957, 1959  ? x 2  ? Thyroid disease   ? hypothyroidism  ? ?Past Surgical History:  ?Procedure Laterality Date  ? ABDOMINAL HYSTERECTOMY    ? and right ovary  ? ANTERIOR AND POSTERIOR REPAIR N/A 01/03/2014  ? Procedure: ANTERIOR (CYSTOCELE) REPAIR;  Surgeon: Floyce Stakes. Pamala Hurry, MD;  Location: [place] [facility];  Service: Gynecology;  Laterality: N/A;  ? APPENDECTOMY    ? ATRIAL FIBRILLATION ABLATION N/A 09/28/2017  ? Procedure: ATRIAL FIBRILLATION ABLATION;  Surgeon: [name], MD;  Location: [place] [facility];  Service: Cardiovascular;  Laterality: N/A;  ? BACK SURGERY    ? x 3 - 2 rods and 8 screws  ?  CARDIOVERSION N/A 08/24/2017  ? Procedure: CARDIOVERSION;  Surgeon: Jerline Pain, MD;  Location: [facility] ENDOSCOPY;  Service: Cardiovascular;  Laterality: N/A;  ? CARDIOVERSION N/A 10/19/2017  ? Procedure: CARDIOVERSION;  Surgeon: Josue Hector, MD;  Location: [facility] ENDOSCOPY;  Service: Cardiovascular;  Laterality: N/A;  ? CARDIOVERSION N/A 10/04/2019  ? Procedure: CARDIOVERSION;  Surgeon: Pixie Casino, MD;  Location: [place];  Service: Cardiovascular;  Laterality: N/A;  ? CARDIOVERSION N/A 08/20/2021  ? Procedure: CARDIOVERSION;  Surgeon: Jerline Pain, MD;  Location: [facility] ENDOSCOPY;  Service: Cardiovascular;  Laterality: N/A;  ? COLONOSCOPY    ? CYSTOCELE REPAIR    ? DENTAL SURGERY    ? infected tooth - general  ? DILATION AND CURETTAGE OF UTERUS    ? x several  ? ELECTROPHYSIOLOGIC STUDY N/A 11/28/2015  ? Procedure: Atrial Fibrillation Ablation;  Surgeon: [name], MD;  Location: [place] [facility];  Service: Cardiovascular;  Laterality: N/A;  ? EP IMPLANTABLE DEVICE N/A 06/08/2016  ? Procedure: Loop Recorder Insertion;  Surgeon: [name], MD;  Location: [place] [facility];  Service: Cardiovascular;  Laterality: N/A;  ? EXAMINATION UNDER ANESTHESIA N/A 01/15/2014  ? Procedure: EXAM UNDER ANESTHESIA with evacuation of hematoma and over sewing of vaginal  mucosa.;  Surgeon: Floyce Stakes. Pamala Hurry, MD;  Location: [place] [facility];  Service: Gynecology;  Laterality: N/A;  ? HAND SURGERY    ? left  ? hemorrhoid injection  04/2011  ? implantable loop recorder removal  08/06/2021  ? MDT Reveal LINQ removed by Dr Rayann Heman  ? JOINT REPLACEMENT    ? left thumb replacement  ? MYRINGOTOMY WITH TUBE PLACEMENT Right 10/27/2018  ? TEE WITHOUT CARDIOVERSION N/A 09/27/2017  ? Procedure: TRANSESOPHAGEAL ECHOCARDIOGRAM (TEE);  Surgeon: Sueanne Margarita, MD;  Location: [facility] ENDOSCOPY;  Service: Cardiovascular;  Laterality: N/A;  ? TONSILLECTOMY    ? ?Patient Active Problem List  ? Diagnosis Date Noted  ? Hypercoagulable state due to atrial  fibrillation ([place]) 10/03/2021  ? Coronary artery disease involving native coronary artery of native heart without angina pectoris 09/12/2020  ? Atrial flutter ([place])   ? Persistent atrial fibrillation ([place]) 09/28/2017  ? PAF (paroxysmal atrial fibrillation) ([place]) 11/28/2015  ? First degree AV block 07/30/2014  ? Chronic anticoagulation 07/30/2014  ? Dizziness 07/30/2014  ? Post-op bleeding 01/15/2014  ? Cystocele 01/03/2014  ? Atrial fibrillation ([place]) 07/31/2013  ? Hypertension   ? Thyroid disease   ? Chronic kidney disease   ? Internal hemorrhoid   ? Rectal bleeding   ? Asthmatic bronchitis   ? ? ?REFERRING DIAG:  ?M54.2 (ICD-10-CM) - Cervicalgia ? ?THERAPY DIAG:  ?Cervicalgia ? ?Muscle weakness (generalized) ? ?PERTINENT HISTORY:  ?Afib, HTN ? ?PRECAUTIONS:  ?None ? ?SUBJECTIVE:  ?Pt presents to PT with reports of continued R sided neck pain and discomfort. Also notes R sided lower back pain, hit her R hip on something yesterday and believes pain could be due to this. Pt has been compliant with HEP with no adverse effect. Pt is ready to begin PT treatment at this time.  ? ?Pain: ?Are you having pain? Yes ?NPRS scale: 3/10 (6/10 at worst) ?Pain location: R side of neck ?PAIN TYPE: sharp ?Pain description: intermittent  ?Aggravating factors: lying on R side, neck rotation ?Relieving factors: heat, Voltaren  ? ? ?OBJECTIVE:  ?  ?PATIENT SURVEYS:  ?FOTO 46% function; 55% predicted ?  ?   ?CERVICAL AROM/PROM ?  ?A/PROM A/PROM (deg) ?11/10/2021  ?Flexion    ?Extension    ?Right rotation 40 p!  ?Left rotation 50  ? (Blank rows = not tested) ?  ?UE ROM: ?  ?AROM Right ?11/10/2021 Left ?11/10/2021  ?Shoulder flexion [facility] WFL  ?Shoulder extension      ?Shoulder abduction [facility] WFL  ?Shoulder internal rotation      ?Shoulder external rotation      ?Elbow flexion      ?Elbow extension      ?Wrist flexion      ?Wrist extension      ? (Blank rows = not tested) ?  ?UE MMT: ?  ?MMT Right ?11/10/2021 Left ?11/10/2021  ?Shoulder flexion [facility] WFL   ?Shoulder extension      ?Shoulder abduction [facility] WFL  ?Shoulder adduction      ?Shoulder extension      ?Shoulder internal rotation      ?Shoulder external rotation      ?Middle trapezius 3+/5 3+/5  ?Lower trapezius 3+/5 3+/5  ?Elbow flexion      ?Elbow extension      ?Wrist flexion      ?Wrist extension      ?Wrist ulnar deviation      ?Wrist radial deviation      ?Wrist pronation      ?  Wrist supination      ?Grip strength      ? (Blank rows = not tested) ?  ?TODAY'S TREATMENT:  ?[facility] Adult PT Treatment: DATE: 11/19/2021 ?Therapeutic Exercise: ?NuStep lvl 5 UE/LE x 3 min while taking subjective ?Row 2x12 BTB ?Shoulder ext w/ scap retraction 2x10 BTB ?Supine chin tuck 2x10 - 3" hold ?Supine horizontal abd 2x10 RTB ?Manual Therapy: ?Suboccipital release ?STM to R upper trap ?Positional release to R upper trap ? ?[facility] Adult PT Treatment: DATE: 11/10/2021 ?Therapeutic Exercise: ?R upper trap stretch x 30" ?Seated scapular retraction x 10 ?Seated row x 15 GTB ?Standing chin tuck x 5 - increased pain, withheld ?Seated chin tuck x 5 - 5" hold ?  ?PATIENT EDUCATION:  ?Education details: eval findings, FOTO, HEP, POC ?Person educated: Patient ?Education method: Explanation, Demonstration, and Handouts ?Education comprehension: verbalized understanding and returned demonstration ?  ?  ?HOME EXERCISE PROGRAM: ?Access Code: [email or web address] ?URL: [email or web address] ?Date: 11/10/2021 ?Prepared by: Octavio Manns ?  ?Exercises ?Seated Upper Trapezius Stretch - 1 x daily - 7 x weekly - 3 reps - 30 second hold ?Seated Scapular Retraction - 1 x daily - 7 x weekly - 2 sets - 10 reps ?Standing Shoulder Row with Anchored Resistance - 1 x daily - 7 x weekly - 3 sets - 10 reps ?Seated Passive Cervical Retraction - 1 x daily - 7 x weekly - 2 sets - 10 reps - 5 sec hold ?  ?ASSESSMENT: ?  ?CLINICAL IMPRESSION: ?Pt was able to complete all prescribed exercises with no adverse effect. Therapy today focused on improving DNF and  periscapular strength in order to decrease pain and improve comfort. Pt also responded well to manual therapy. Pt is progressing well and should continue to be seen and progressed as tolerated.  ?  ?OBJECTIVE IMPAIRMENT

## 2021-11-20 DIAGNOSIS — M1711 Unilateral primary osteoarthritis, right knee: Secondary | ICD-10-CM | POA: Diagnosis not present

## 2021-11-20 NOTE — Therapy (Signed)
?OUTPATIENT PHYSICAL THERAPY TREATMENT NOTE ? ? ?Patient Name: Frances [name] ?MRN: [id number] ?DOB:June 09, 1934, 88 y.o., female ?Today's Date: 11/21/2021 ? ?PCP: Lajean Manes, MD ?REFERRING PROVIDER: Lajean Manes, MD ? ? PT End of Session - 11/21/21 1211   ? ? Visit Number 3   ? Number of Visits 17   ? Date for PT Re-Evaluation 01/05/22   ? Authorization Type Medicare   ? PT Start Time 1211   ? PT Stop Time 5732   ? PT Time Calculation (min) 41 min   ? Activity Tolerance Patient tolerated treatment well   ? Behavior During Therapy [facility] for tasks assessed/performed   ? ?  ?  ? ?  ? ? ? ?Past Medical History:  ?Diagnosis Date  ? Arthritis   ? hands, back  ? Asthmatic bronchitis   ? Atrial fibrillation ([place]) 07/31/2013  ? Chronic anticoagulation 07/30/2014  ? Chronic kidney disease   ? stage 3 renal disease - no med  ? Dizziness 07/30/2014  ? Dysrhythmia   ? Hx - a-fib 05/2010 - tx with meds, no problem since 05/2010  ? First degree AV block 07/30/2014  ? Hearing loss   ? bilateral hearing aids  ? Hypertension   ? controlled with meds  ? Hypothyroidism   ? Internal hemorrhoid   ? PAF (paroxysmal atrial fibrillation) ([place]) 11/28/2015  ? Paroxysmal atrial fibrillation ([facility])   ? Peripheral vascular disease ([place])   ? right arm and right shoulder blood clots r/t a fall  ? Persistent atrial fibrillation ([place]) 09/28/2017  ? Rectal bleeding   ? SVD (spontaneous vaginal delivery) 1957, 1959  ? x 2  ? Thyroid disease   ? hypothyroidism  ? ?Past Surgical History:  ?Procedure Laterality Date  ? ABDOMINAL HYSTERECTOMY    ? and right ovary  ? ANTERIOR AND POSTERIOR REPAIR N/A 01/03/2014  ? Procedure: ANTERIOR (CYSTOCELE) REPAIR;  Surgeon: Floyce Stakes. Pamala Hurry, MD;  Location: [place] [facility];  Service: Gynecology;  Laterality: N/A;  ? APPENDECTOMY    ? ATRIAL FIBRILLATION ABLATION N/A 09/28/2017  ? Procedure: ATRIAL FIBRILLATION ABLATION;  Surgeon: [name], MD;  Location: [place] [facility];  Service: Cardiovascular;  Laterality: N/A;  ?  BACK SURGERY    ? x 3 - 2 rods and 8 screws  ? CARDIOVERSION N/A 08/24/2017  ? Procedure: CARDIOVERSION;  Surgeon: Jerline Pain, MD;  Location: [facility] ENDOSCOPY;  Service: Cardiovascular;  Laterality: N/A;  ? CARDIOVERSION N/A 10/19/2017  ? Procedure: CARDIOVERSION;  Surgeon: Josue Hector, MD;  Location: [facility] ENDOSCOPY;  Service: Cardiovascular;  Laterality: N/A;  ? CARDIOVERSION N/A 10/04/2019  ? Procedure: CARDIOVERSION;  Surgeon: Pixie Casino, MD;  Location: [place];  Service: Cardiovascular;  Laterality: N/A;  ? CARDIOVERSION N/A 08/20/2021  ? Procedure: CARDIOVERSION;  Surgeon: Jerline Pain, MD;  Location: [facility] ENDOSCOPY;  Service: Cardiovascular;  Laterality: N/A;  ? COLONOSCOPY    ? CYSTOCELE REPAIR    ? DENTAL SURGERY    ? infected tooth - general  ? DILATION AND CURETTAGE OF UTERUS    ? x several  ? ELECTROPHYSIOLOGIC STUDY N/A 11/28/2015  ? Procedure: Atrial Fibrillation Ablation;  Surgeon: [name], MD;  Location: [place] [facility];  Service: Cardiovascular;  Laterality: N/A;  ? EP IMPLANTABLE DEVICE N/A 06/08/2016  ? Procedure: Loop Recorder Insertion;  Surgeon: [name], MD;  Location: [place] [facility];  Service: Cardiovascular;  Laterality: N/A;  ? EXAMINATION UNDER ANESTHESIA N/A 01/15/2014  ? Procedure: EXAM UNDER  ANESTHESIA with evacuation of hematoma and over sewing of vaginal mucosa.;  Surgeon: Floyce Stakes. Pamala Hurry, MD;  Location: [place] [facility];  Service: Gynecology;  Laterality: N/A;  ? HAND SURGERY    ? left  ? hemorrhoid injection  04/2011  ? implantable loop recorder removal  08/06/2021  ? MDT Reveal LINQ removed by Dr Rayann Heman  ? JOINT REPLACEMENT    ? left thumb replacement  ? MYRINGOTOMY WITH TUBE PLACEMENT Right 10/27/2018  ? TEE WITHOUT CARDIOVERSION N/A 09/27/2017  ? Procedure: TRANSESOPHAGEAL ECHOCARDIOGRAM (TEE);  Surgeon: Sueanne Margarita, MD;  Location: [facility] ENDOSCOPY;  Service: Cardiovascular;  Laterality: N/A;  ? TONSILLECTOMY    ? ?Patient Active Problem List  ? Diagnosis Date  Noted  ? Hypercoagulable state due to atrial fibrillation ([place]) 10/03/2021  ? Coronary artery disease involving native coronary artery of native heart without angina pectoris 09/12/2020  ? Atrial flutter ([place])   ? Persistent atrial fibrillation ([place]) 09/28/2017  ? PAF (paroxysmal atrial fibrillation) ([place]) 11/28/2015  ? First degree AV block 07/30/2014  ? Chronic anticoagulation 07/30/2014  ? Dizziness 07/30/2014  ? Post-op bleeding 01/15/2014  ? Cystocele 01/03/2014  ? Atrial fibrillation ([place]) 07/31/2013  ? Hypertension   ? Thyroid disease   ? Chronic kidney disease   ? Internal hemorrhoid   ? Rectal bleeding   ? Asthmatic bronchitis   ? ? ?REFERRING DIAG:  ?M54.2 (ICD-10-CM) - Cervicalgia ? ?THERAPY DIAG:  ?Cervicalgia ? ?Muscle weakness (generalized) ? ?Abnormal posture ? ?PERTINENT HISTORY:  ?Afib, HTN ? ?PRECAUTIONS:  ?None ? ?SUBJECTIVE: I'm feeling ok, I got a cortisone shot in my knee yesterday and it feels a bit better today. ? ? ?Pain: ?Are you having pain? Yes ?NPRS scale: 2/10 (4/10 at worst) ?Pain location: R side of neck ?PAIN TYPE: sharp ?Pain description: intermittent  ?Aggravating factors: lying on R side, neck rotation ?Relieving factors: heat, Voltaren  ? ? ?OBJECTIVE:  ?  ?PATIENT SURVEYS:  ?FOTO 46% function; 55% predicted ?  ?   ?CERVICAL AROM/PROM ?  ?A/PROM A/PROM (deg) ?11/10/2021  ?Flexion    ?Extension    ?Right rotation 40 p!  ?Left rotation 50  ? (Blank rows = not tested) ?  ?UE ROM: ?  ?AROM Right ?11/10/2021 Left ?11/10/2021  ?Shoulder flexion [facility] WFL  ?Shoulder extension      ?Shoulder abduction [facility] WFL  ?Shoulder internal rotation      ?Shoulder external rotation      ?Elbow flexion      ?Elbow extension      ?Wrist flexion      ?Wrist extension      ? (Blank rows = not tested) ?  ?UE MMT: ?  ?MMT Right ?11/10/2021 Left ?11/10/2021  ?Shoulder flexion [facility] WFL  ?Shoulder extension      ?Shoulder abduction [facility] WFL  ?Shoulder adduction      ?Shoulder extension      ?Shoulder internal rotation       ?Shoulder external rotation      ?Middle trapezius 3+/5 3+/5  ?Lower trapezius 3+/5 3+/5  ?Elbow flexion      ?Elbow extension      ?Wrist flexion      ?Wrist extension      ?Wrist ulnar deviation      ?Wrist radial deviation      ?Wrist pronation      ?Wrist supination      ?Grip strength      ? (Blank rows = not tested) ?  ?TODAY'S TREATMENT:  ?  [facility] Adult PT Treatment:                                                DATE: 11/21/2021 ?Therapeutic Exercise: ?NuStep lvl 5 UE/LE x 6 min while taking subjective ?AAROM cervical rotation x10 each way ?AAROM head nods x10 each way ?Row 2x10 BlueTB ?Shoulder ext w/ scap retraction 2x10 BlueTB ?Palloff press 3# cable x10 BIL ?Upper trap stretch 2x30" BIL ?Supine horizontal abd 2x10 RTB ?Supine diagonals x10 BIL RTB ?Manual Therapy: ?Suboccipital release ?STM to R upper trap ?Positional release to R upper trap ? ? ?[facility] Adult PT Treatment: DATE: 11/19/2021 ?Therapeutic Exercise: ?NuStep lvl 5 UE/LE x 3 min while taking subjective ?Row 2x12 BTB ?Shoulder ext w/ scap retraction 2x10 BTB ?Supine chin tuck 2x10 - 3" hold ?Supine horizontal abd 2x10 RTB ?Manual Therapy: ?Suboccipital release ?STM to R upper trap ?Positional release to R upper trap ? ?[facility] Adult PT Treatment: DATE: 11/10/2021 ?Therapeutic Exercise: ?R upper trap stretch x 30" ?Seated scapular retraction x 10 ?Seated row x 15 GTB ?Standing chin tuck x 5 - increased pain, withheld ?Seated chin tuck x 5 - 5" hold ?  ?PATIENT EDUCATION:  ?Education details: eval findings, FOTO, HEP, POC ?Person educated: Patient ?Education method: Explanation, Demonstration, and Handouts ?Education comprehension: verbalized understanding and returned demonstration ?  ?  ?HOME EXERCISE PROGRAM: ?Access Code: [email or web address] ?URL: [email or web address] ?Date: 11/10/2021 ?Prepared by: Octavio Manns ?  ?Exercises ?Seated Upper Trapezius Stretch - 1 x daily - 7 x weekly - 3 reps - 30 second hold ?Seated Scapular Retraction - 1 x daily -  7 x weekly - 2 sets - 10 reps ?Standing Shoulder Row with Anchored Resistance - 1 x daily - 7 x weekly - 3 sets - 10 reps ?Seated Passive Cervical Retraction - 1 x daily - 7 x weekly - 2 sets - 10 r

## 2021-11-21 ENCOUNTER — Ambulatory Visit: Payer: Medicare Other

## 2021-11-21 ENCOUNTER — Other Ambulatory Visit: Payer: Self-pay

## 2021-11-21 DIAGNOSIS — M6281 Muscle weakness (generalized): Secondary | ICD-10-CM | POA: Diagnosis not present

## 2021-11-21 DIAGNOSIS — M542 Cervicalgia: Secondary | ICD-10-CM

## 2021-11-21 DIAGNOSIS — R293 Abnormal posture: Secondary | ICD-10-CM | POA: Diagnosis not present

## 2021-11-24 ENCOUNTER — Ambulatory Visit: Payer: Medicare Other

## 2021-11-24 ENCOUNTER — Other Ambulatory Visit: Payer: Self-pay

## 2021-11-24 DIAGNOSIS — R293 Abnormal posture: Secondary | ICD-10-CM | POA: Diagnosis not present

## 2021-11-24 DIAGNOSIS — M542 Cervicalgia: Secondary | ICD-10-CM | POA: Diagnosis not present

## 2021-11-24 DIAGNOSIS — M6281 Muscle weakness (generalized): Secondary | ICD-10-CM | POA: Diagnosis not present

## 2021-11-24 NOTE — Therapy (Signed)
?OUTPATIENT PHYSICAL THERAPY TREATMENT NOTE ? ? ?Patient Name: Frances [name] ?MRN: [id number] ?DOB:1933-10-27, 88 y.o., female ?Today's Date: 11/24/2021 ? ?PCP: Lajean Manes, MD ?REFERRING PROVIDER: Lajean Manes, MD ? ? PT End of Session - 11/24/21 1356   ? ? Visit Number 4   ? Number of Visits 17   ? Date for PT Re-Evaluation 01/05/22   ? Authorization Type Medicare   ? PT Start Time 1400   ? PT Stop Time 1440   ? PT Time Calculation (min) 40 min   ? Activity Tolerance Patient tolerated treatment well   ? Behavior During Therapy [facility] for tasks assessed/performed   ? ?  ?  ? ?  ? ? ? ? ?Past Medical History:  ?Diagnosis Date  ? Arthritis   ? hands, back  ? Asthmatic bronchitis   ? Atrial fibrillation ([place]) 07/31/2013  ? Chronic anticoagulation 07/30/2014  ? Chronic kidney disease   ? stage 3 renal disease - no med  ? Dizziness 07/30/2014  ? Dysrhythmia   ? Hx - a-fib 05/2010 - tx with meds, no problem since 05/2010  ? First degree AV block 07/30/2014  ? Hearing loss   ? bilateral hearing aids  ? Hypertension   ? controlled with meds  ? Hypothyroidism   ? Internal hemorrhoid   ? PAF (paroxysmal atrial fibrillation) ([place]) 11/28/2015  ? Paroxysmal atrial fibrillation ([facility])   ? Peripheral vascular disease ([place])   ? right arm and right shoulder blood clots r/t a fall  ? Persistent atrial fibrillation ([place]) 09/28/2017  ? Rectal bleeding   ? SVD (spontaneous vaginal delivery) 1957, 1959  ? x 2  ? Thyroid disease   ? hypothyroidism  ? ?Past Surgical History:  ?Procedure Laterality Date  ? ABDOMINAL HYSTERECTOMY    ? and right ovary  ? ANTERIOR AND POSTERIOR REPAIR N/A 01/03/2014  ? Procedure: ANTERIOR (CYSTOCELE) REPAIR;  Surgeon: Floyce Stakes. Pamala Hurry, MD;  Location: [place] [facility];  Service: Gynecology;  Laterality: N/A;  ? APPENDECTOMY    ? ATRIAL FIBRILLATION ABLATION N/A 09/28/2017  ? Procedure: ATRIAL FIBRILLATION ABLATION;  Surgeon: [name], MD;  Location: [place] [facility];  Service: Cardiovascular;  Laterality: N/A;   ? BACK SURGERY    ? x 3 - 2 rods and 8 screws  ? CARDIOVERSION N/A 08/24/2017  ? Procedure: CARDIOVERSION;  Surgeon: Jerline Pain, MD;  Location: [facility] ENDOSCOPY;  Service: Cardiovascular;  Laterality: N/A;  ? CARDIOVERSION N/A 10/19/2017  ? Procedure: CARDIOVERSION;  Surgeon: Josue Hector, MD;  Location: [facility] ENDOSCOPY;  Service: Cardiovascular;  Laterality: N/A;  ? CARDIOVERSION N/A 10/04/2019  ? Procedure: CARDIOVERSION;  Surgeon: Pixie Casino, MD;  Location: [place];  Service: Cardiovascular;  Laterality: N/A;  ? CARDIOVERSION N/A 08/20/2021  ? Procedure: CARDIOVERSION;  Surgeon: Jerline Pain, MD;  Location: [facility] ENDOSCOPY;  Service: Cardiovascular;  Laterality: N/A;  ? COLONOSCOPY    ? CYSTOCELE REPAIR    ? DENTAL SURGERY    ? infected tooth - general  ? DILATION AND CURETTAGE OF UTERUS    ? x several  ? ELECTROPHYSIOLOGIC STUDY N/A 11/28/2015  ? Procedure: Atrial Fibrillation Ablation;  Surgeon: [name], MD;  Location: [place] [facility];  Service: Cardiovascular;  Laterality: N/A;  ? EP IMPLANTABLE DEVICE N/A 06/08/2016  ? Procedure: Loop Recorder Insertion;  Surgeon: [name], MD;  Location: [place] [facility];  Service: Cardiovascular;  Laterality: N/A;  ? EXAMINATION UNDER ANESTHESIA N/A 01/15/2014  ? Procedure: EXAM  UNDER ANESTHESIA with evacuation of hematoma and over sewing of vaginal mucosa.;  Surgeon: Floyce Stakes. Pamala Hurry, MD;  Location: [place] [facility];  Service: Gynecology;  Laterality: N/A;  ? HAND SURGERY    ? left  ? hemorrhoid injection  04/2011  ? implantable loop recorder removal  08/06/2021  ? MDT Reveal LINQ removed by Dr Rayann Heman  ? JOINT REPLACEMENT    ? left thumb replacement  ? MYRINGOTOMY WITH TUBE PLACEMENT Right 10/27/2018  ? TEE WITHOUT CARDIOVERSION N/A 09/27/2017  ? Procedure: TRANSESOPHAGEAL ECHOCARDIOGRAM (TEE);  Surgeon: Sueanne Margarita, MD;  Location: [facility] ENDOSCOPY;  Service: Cardiovascular;  Laterality: N/A;  ? TONSILLECTOMY    ? ?Patient Active Problem List  ? Diagnosis  Date Noted  ? Hypercoagulable state due to atrial fibrillation ([place]) 10/03/2021  ? Coronary artery disease involving native coronary artery of native heart without angina pectoris 09/12/2020  ? Atrial flutter ([place])   ? Persistent atrial fibrillation ([place]) 09/28/2017  ? PAF (paroxysmal atrial fibrillation) ([place]) 11/28/2015  ? First degree AV block 07/30/2014  ? Chronic anticoagulation 07/30/2014  ? Dizziness 07/30/2014  ? Post-op bleeding 01/15/2014  ? Cystocele 01/03/2014  ? Atrial fibrillation ([place]) 07/31/2013  ? Hypertension   ? Thyroid disease   ? Chronic kidney disease   ? Internal hemorrhoid   ? Rectal bleeding   ? Asthmatic bronchitis   ? ? ?REFERRING DIAG:  ?M54.2 (ICD-10-CM) - Cervicalgia ? ?THERAPY DIAG:  ?Cervicalgia ? ?Muscle weakness (generalized) ? ?PERTINENT HISTORY:  ?Afib, HTN ? ?PRECAUTIONS:  ?None ? ?SUBJECTIVE:  ?Pt presents to PT with continued reports of slight neck pain with rotation, otherwise is feeling well. Pt has been compliant with HEP with no adverse effect. Pt is ready to begin PT treatment at this time.  ? ? ?Pain: ?Are you having pain? Yes ?NPRS scale: 4/10 - with movement ?Pain location: R side of neck ?PAIN TYPE: sharp ?Pain description: intermittent  ?Aggravating factors: lying on R side, neck rotation ?Relieving factors: heat, Voltaren  ? ? ?OBJECTIVE:  ?  ?PATIENT SURVEYS:  ?FOTO 46% function; 55% predicted ?  ?   ?CERVICAL AROM/PROM ?  ?A/PROM A/PROM (deg) ?11/10/2021  ?Flexion    ?Extension    ?Right rotation 40 p!  ?Left rotation 50  ? (Blank rows = not tested) ?  ?UE ROM: ?  ?AROM Right ?11/10/2021 Left ?11/10/2021  ?Shoulder flexion [facility] WFL  ?Shoulder extension      ?Shoulder abduction [facility] WFL  ?Shoulder internal rotation      ?Shoulder external rotation      ?Elbow flexion      ?Elbow extension      ?Wrist flexion      ?Wrist extension      ? (Blank rows = not tested) ?  ?UE MMT: ?  ?MMT Right ?11/10/2021 Left ?11/10/2021  ?Shoulder flexion [facility] WFL  ?Shoulder extension      ?Shoulder  abduction [facility] WFL  ?Shoulder adduction      ?Shoulder extension      ?Shoulder internal rotation      ?Shoulder external rotation      ?Middle trapezius 3+/5 3+/5  ?Lower trapezius 3+/5 3+/5  ?Elbow flexion      ?Elbow extension      ?Wrist flexion      ?Wrist extension      ?Wrist ulnar deviation      ?Wrist radial deviation      ?Wrist pronation      ?Wrist supination      ?  Grip strength      ? (Blank rows = not tested) ?  ?TODAY'S TREATMENT:  ?[facility] Adult PT Treatment:                                                DATE: 11/24/2021 ?Therapeutic Exercise: ?NuStep lvl 5 UE/LE x 6 min while taking subjective ?AAROM cervical rotation x10 each way ?AAROM head nods x10 each way ?Row 2x12 BlueTB ?Palloff press 3# cable x10 BIL ?Upper trap stretch x 30" BIL ?Levator stretch x 30" BIL ?Seated horizontal abd 2x10 RTB ?Supine serratus raise x 10 YTB ?Manual Therapy: ?Suboccipital release ?STM to R upper trap ?Positional release to R upper trap ? ?[facility] Adult PT Treatment:                                                DATE: 11/21/2021 ?Therapeutic Exercise: ?NuStep lvl 5 UE/LE x 6 min while taking subjective ?AAROM cervical rotation x10 each way ?AAROM head nods x10 each way ?Row 2x10 BlueTB ?Shoulder ext w/ scap retraction 2x10 BlueTB ?Palloff press 3# cable x10 BIL ?Upper trap stretch 2x30" BIL ?Supine horizontal abd 2x10 RTB ?Supine diagonals x10 BIL RTB ?Manual Therapy: ?Suboccipital release ?STM to R upper trap ?Positional release to R upper trap ? ? ?[facility] Adult PT Treatment: DATE: 11/19/2021 ?Therapeutic Exercise: ?NuStep lvl 5 UE/LE x 3 min while taking subjective ?Row 2x12 BTB ?Shoulder ext w/ scap retraction 2x10 BTB ?Supine chin tuck 2x10 - 3" hold ?Supine horizontal abd 2x10 RTB ?Manual Therapy: ?Suboccipital release ?STM to R upper trap ?Positional release to R upper trap ?  ?PATIENT EDUCATION:  ?Education details: eval findings, FOTO, HEP, POC ?Person educated: Patient ?Education method: Explanation, Demonstration,  and Handouts ?Education comprehension: verbalized understanding and returned demonstration ?  ?  ?HOME EXERCISE PROGRAM: ?Access Code: [email or web address] ?URL: [email or web address] ?Date: 11/11/18

## 2021-11-26 ENCOUNTER — Ambulatory Visit: Payer: Medicare Other

## 2021-11-26 ENCOUNTER — Other Ambulatory Visit: Payer: Self-pay

## 2021-11-26 DIAGNOSIS — R293 Abnormal posture: Secondary | ICD-10-CM | POA: Diagnosis not present

## 2021-11-26 DIAGNOSIS — M542 Cervicalgia: Secondary | ICD-10-CM | POA: Diagnosis not present

## 2021-11-26 DIAGNOSIS — M6281 Muscle weakness (generalized): Secondary | ICD-10-CM | POA: Diagnosis not present

## 2021-11-26 NOTE — Therapy (Signed)
?OUTPATIENT PHYSICAL THERAPY TREATMENT NOTE ? ? ?Patient Name: Frances [name] ?MRN: [id number] ?DOB:[birth year], 88 y.o., female ?Today's Date: 11/26/2021 ? ?PCP: Lajean Manes, MD ?REFERRING PROVIDER: Lajean Manes, MD ? ? PT End of Session - 11/26/21 1355   ? ? Visit Number 5   ? Number of Visits 17   ? Date for PT Re-Evaluation 01/05/22   ? Authorization Type Medicare   ? PT Start Time 1400   ? PT Stop Time 1440   ? PT Time Calculation (min) 40 min   ? Activity Tolerance Patient tolerated treatment well   ? Behavior During Therapy [facility] for tasks assessed/performed   ? ?  ?  ? ?  ? ? ? ? ? ?Past Medical History:  ?Diagnosis Date  ? Arthritis   ? hands, back  ? Asthmatic bronchitis   ? Atrial fibrillation ([place]) 07/31/2013  ? Chronic anticoagulation 07/30/2014  ? Chronic kidney disease   ? stage 3 renal disease - no med  ? Dizziness 07/30/2014  ? Dysrhythmia   ? Hx - a-fib 05/2010 - tx with meds, no problem since 05/2010  ? First degree AV block 07/30/2014  ? Hearing loss   ? bilateral hearing aids  ? Hypertension   ? controlled with meds  ? Hypothyroidism   ? Internal hemorrhoid   ? PAF (paroxysmal atrial fibrillation) ([place]) 11/28/2015  ? Paroxysmal atrial fibrillation ([facility])   ? Peripheral vascular disease ([place])   ? right arm and right shoulder blood clots r/t a fall  ? Persistent atrial fibrillation ([place]) 09/28/2017  ? Rectal bleeding   ? SVD (spontaneous vaginal delivery) 1957, 1959  ? x 2  ? Thyroid disease   ? hypothyroidism  ? ?Past Surgical History:  ?Procedure Laterality Date  ? ABDOMINAL HYSTERECTOMY    ? and right ovary  ? ANTERIOR AND POSTERIOR REPAIR N/A 01/03/2014  ? Procedure: ANTERIOR (CYSTOCELE) REPAIR;  Surgeon: Floyce Stakes. Pamala Hurry, MD;  Location: [place] [facility];  Service: Gynecology;  Laterality: N/A;  ? APPENDECTOMY    ? ATRIAL FIBRILLATION ABLATION N/A 09/28/2017  ? Procedure: ATRIAL FIBRILLATION ABLATION;  Surgeon: [name], MD;  Location: [place] [facility];  Service: Cardiovascular;  Laterality: N/A;   ? BACK SURGERY    ? x 3 - 2 rods and 8 screws  ? CARDIOVERSION N/A 08/24/2017  ? Procedure: CARDIOVERSION;  Surgeon: Jerline Pain, MD;  Location: [facility] ENDOSCOPY;  Service: Cardiovascular;  Laterality: N/A;  ? CARDIOVERSION N/A 10/19/2017  ? Procedure: CARDIOVERSION;  Surgeon: Josue Hector, MD;  Location: [facility] ENDOSCOPY;  Service: Cardiovascular;  Laterality: N/A;  ? CARDIOVERSION N/A 10/04/2019  ? Procedure: CARDIOVERSION;  Surgeon: Pixie Casino, MD;  Location: [place];  Service: Cardiovascular;  Laterality: N/A;  ? CARDIOVERSION N/A 08/20/2021  ? Procedure: CARDIOVERSION;  Surgeon: Jerline Pain, MD;  Location: [facility] ENDOSCOPY;  Service: Cardiovascular;  Laterality: N/A;  ? COLONOSCOPY    ? CYSTOCELE REPAIR    ? DENTAL SURGERY    ? infected tooth - general  ? DILATION AND CURETTAGE OF UTERUS    ? x several  ? ELECTROPHYSIOLOGIC STUDY N/A 11/28/2015  ? Procedure: Atrial Fibrillation Ablation;  Surgeon: [name], MD;  Location: [place] [facility];  Service: Cardiovascular;  Laterality: N/A;  ? EP IMPLANTABLE DEVICE N/A 06/08/2016  ? Procedure: Loop Recorder Insertion;  Surgeon: [name], MD;  Location: [place] [facility];  Service: Cardiovascular;  Laterality: N/A;  ? EXAMINATION UNDER ANESTHESIA N/A 01/15/2014  ? Procedure:  EXAM UNDER ANESTHESIA with evacuation of hematoma and over sewing of vaginal mucosa.;  Surgeon: Floyce Stakes. Pamala Hurry, MD;  Location: [place] [facility];  Service: Gynecology;  Laterality: N/A;  ? HAND SURGERY    ? left  ? hemorrhoid injection  04/2011  ? implantable loop recorder removal  08/06/2021  ? MDT Reveal LINQ removed by Dr Rayann Heman  ? JOINT REPLACEMENT    ? left thumb replacement  ? MYRINGOTOMY WITH TUBE PLACEMENT Right 10/27/2018  ? TEE WITHOUT CARDIOVERSION N/A 09/27/2017  ? Procedure: TRANSESOPHAGEAL ECHOCARDIOGRAM (TEE);  Surgeon: Sueanne Margarita, MD;  Location: [facility] ENDOSCOPY;  Service: Cardiovascular;  Laterality: N/A;  ? TONSILLECTOMY    ? ?Patient Active Problem List  ? Diagnosis  Date Noted  ? Hypercoagulable state due to atrial fibrillation ([place]) 10/03/2021  ? Coronary artery disease involving native coronary artery of native heart without angina pectoris 09/12/2020  ? Atrial flutter ([place])   ? Persistent atrial fibrillation ([place]) 09/28/2017  ? PAF (paroxysmal atrial fibrillation) ([place]) 11/28/2015  ? First degree AV block 07/30/2014  ? Chronic anticoagulation 07/30/2014  ? Dizziness 07/30/2014  ? Post-op bleeding 01/15/2014  ? Cystocele 01/03/2014  ? Atrial fibrillation ([place]) 07/31/2013  ? Hypertension   ? Thyroid disease   ? Chronic kidney disease   ? Internal hemorrhoid   ? Rectal bleeding   ? Asthmatic bronchitis   ? ? ?REFERRING DIAG:  ?M54.2 (ICD-10-CM) - Cervicalgia ? ?THERAPY DIAG:  ?Cervicalgia ? ?Muscle weakness (generalized) ? ?PERTINENT HISTORY:  ?Afib, HTN ? ?PRECAUTIONS:  ?None ? ?SUBJECTIVE:  ?Pt presents to PT with continued reports of neck pain with rotation. Has been compliant with HEP with no adverse effect. Pt is ready to begin PT treatment a this.  ? ?Pain: ?Are you having pain? Yes ?NPRS scale: 3/10 - with movement ?Pain location: R side of neck ?PAIN TYPE: sharp ?Pain description: intermittent  ?Aggravating factors: lying on R side, neck rotation ?Relieving factors: heat, Voltaren  ? ? ?OBJECTIVE:  ?  ?PATIENT SURVEYS:  ?FOTO 46% function; 55% predicted ?  ?   ?CERVICAL AROM/PROM ?  ?A/PROM A/PROM (deg) ?11/10/2021  ?Flexion    ?Extension    ?Right rotation 40 p!  ?Left rotation 50  ? (Blank rows = not tested) ?  ?UE ROM: ?  ?AROM Right ?11/10/2021 Left ?11/10/2021  ?Shoulder flexion [facility] WFL  ?Shoulder extension      ?Shoulder abduction [facility] WFL  ?Shoulder internal rotation      ?Shoulder external rotation      ?Elbow flexion      ?Elbow extension      ?Wrist flexion      ?Wrist extension      ? (Blank rows = not tested) ?  ?UE MMT: ?  ?MMT Right ?11/10/2021 Left ?11/10/2021  ?Shoulder flexion [facility] WFL  ?Shoulder extension      ?Shoulder abduction [facility] WFL  ?Shoulder adduction       ?Shoulder extension      ?Shoulder internal rotation      ?Shoulder external rotation      ?Middle trapezius 3+/5 3+/5  ?Lower trapezius 3+/5 3+/5  ?Elbow flexion      ?Elbow extension      ?Wrist flexion      ?Wrist extension      ?Wrist ulnar deviation      ?Wrist radial deviation      ?Wrist pronation      ?Wrist supination      ?Grip strength      ? (  Blank rows = not tested) ?  ?TODAY'S TREATMENT:  ?[facility] Adult PT Treatment:                                                DATE: 11/26/2021 ?Therapeutic Exercise: ?NuStep lvl 5 UE/LE x 5 min while taking subjective ?AAROM cervical rotation x10 each way ?AAROM head nods x10 each way ?Row 3x12 GTB ?Seated horizontal abd 2x10 RTB ?Standing shoulder ext 2x10 GTB ?Paloff 2x10 Black TB ?Seated bilat ER with scap retraction 2x10 RTB ?Supine serratus punch 2x10 2# ?Supine chin tuck 2x10 - 5" hold ?Manual Therapy: ?Suboccipital release ?STM to R upper trap ?Positional release to R upper trap ? ?[facility] Adult PT Treatment:                                                DATE: 11/24/2021 ?Therapeutic Exercise: ?NuStep lvl 5 UE/LE x 6 min while taking subjective ?AAROM cervical rotation x10 each way ?AAROM head nods x10 each way ?Row 2x12 BlueTB ?Palloff press 3# cable x10 BIL ?Upper trap stretch x 30" BIL ?Levator stretch x 30" BIL ?Seated horizontal abd 2x10 RTB ?Supine serratus raise x 10 YTB ?Manual Therapy: ?Suboccipital release ?STM to R upper trap ?Positional release to R upper trap ? ?PATIENT EDUCATION:  ?Education details: eval findings, FOTO, HEP, POC ?Person educated: Patient ?Education method: Explanation, Demonstration, and Handouts ?Education comprehension: verbalized understanding and returned demonstration ?  ?  ?HOME EXERCISE PROGRAM: ?Access Code: [email or web address] ?URL: [email or web address] ?Date: 11/10/2021 ?Prepared by: Octavio Manns ?  ?Exercises ?Seated Upper Trapezius Stretch - 1 x daily - 7 x weekly - 3 reps - 30 second hold ?Seated Scapular Retraction -  1 x daily - 7 x weekly - 2 sets - 10 reps ?Standing Shoulder Row with Anchored Resistance - 1 x daily - 7 x weekly - 3 sets - 10 reps ?Seated Passive Cervical Retraction - 1 x daily - 7 x weekly - 2 s

## 2021-12-01 ENCOUNTER — Other Ambulatory Visit: Payer: Self-pay

## 2021-12-01 ENCOUNTER — Encounter: Payer: Self-pay | Admitting: Physical Therapy

## 2021-12-01 ENCOUNTER — Ambulatory Visit: Payer: Medicare Other | Admitting: Physical Therapy

## 2021-12-01 DIAGNOSIS — M542 Cervicalgia: Secondary | ICD-10-CM | POA: Diagnosis not present

## 2021-12-01 DIAGNOSIS — R293 Abnormal posture: Secondary | ICD-10-CM | POA: Diagnosis not present

## 2021-12-01 DIAGNOSIS — M6281 Muscle weakness (generalized): Secondary | ICD-10-CM | POA: Diagnosis not present

## 2021-12-01 NOTE — Therapy (Signed)
?OUTPATIENT PHYSICAL THERAPY TREATMENT NOTE ? ? ?Patient Name: Frances [name] ?MRN: [id number] ?DOB:02-27-1934, 88 y.o., female ?Today's Date: 12/01/2021 ? ?PCP: Lajean Manes, MD ?REFERRING PROVIDER: Lajean Manes, MD ? ? PT End of Session - 12/01/21 1434   ? ? Visit Number 6   ? Number of Visits 17   ? Date for PT Re-Evaluation 01/05/22   ? Authorization Type Medicare   ? Progress Note Due on Visit 10   ? PT Start Time 1430   ? PT Stop Time 1510   ? PT Time Calculation (min) 40 min   ? Activity Tolerance Patient tolerated treatment well   ? Behavior During Therapy [facility] for tasks assessed/performed   ? ?  ?  ? ?  ? ? ? ? ? ? ?Past Medical History:  ?Diagnosis Date  ? Arthritis   ? hands, back  ? Asthmatic bronchitis   ? Atrial fibrillation ([place]) 07/31/2013  ? Chronic anticoagulation 07/30/2014  ? Chronic kidney disease   ? stage 3 renal disease - no med  ? Dizziness 07/30/2014  ? Dysrhythmia   ? Hx - a-fib 05/2010 - tx with meds, no problem since 05/2010  ? First degree AV block 07/30/2014  ? Hearing loss   ? bilateral hearing aids  ? Hypertension   ? controlled with meds  ? Hypothyroidism   ? Internal hemorrhoid   ? PAF (paroxysmal atrial fibrillation) ([place]) 11/28/2015  ? Paroxysmal atrial fibrillation (H[facility])   ? Peripheral vascular disease ([place])   ? right arm and right shoulder blood clots r/t a fall  ? Persistent atrial fibrillation ([place]) 09/28/2017  ? Rectal bleeding   ? SVD (spontaneous vaginal delivery) 1957, 1959  ? x 2  ? Thyroid disease   ? hypothyroidism  ? ?Past Surgical History:  ?Procedure Laterality Date  ? ABDOMINAL HYSTERECTOMY    ? and right ovary  ? ANTERIOR AND POSTERIOR REPAIR N/A 01/03/2014  ? Procedure: ANTERIOR (CYSTOCELE) REPAIR;  Surgeon: Floyce Stakes. Pamala Hurry, MD;  Location: [place] [facility];  Service: Gynecology;  Laterality: N/A;  ? APPENDECTOMY    ? ATRIAL FIBRILLATION ABLATION N/A 09/28/2017  ? Procedure: ATRIAL FIBRILLATION ABLATION;  Surgeon: [name], MD;  Location: [place] [facility];   Service: Cardiovascular;  Laterality: N/A;  ? BACK SURGERY    ? x 3 - 2 rods and 8 screws  ? CARDIOVERSION N/A 08/24/2017  ? Procedure: CARDIOVERSION;  Surgeon: Jerline Pain, MD;  Location: [facility] ENDOSCOPY;  Service: Cardiovascular;  Laterality: N/A;  ? CARDIOVERSION N/A 10/19/2017  ? Procedure: CARDIOVERSION;  Surgeon: Josue Hector, MD;  Location: [facility] ENDOSCOPY;  Service: Cardiovascular;  Laterality: N/A;  ? CARDIOVERSION N/A 10/04/2019  ? Procedure: CARDIOVERSION;  Surgeon: Pixie Casino, MD;  Location: [place];  Service: Cardiovascular;  Laterality: N/A;  ? CARDIOVERSION N/A 08/20/2021  ? Procedure: CARDIOVERSION;  Surgeon: Jerline Pain, MD;  Location: [facility] ENDOSCOPY;  Service: Cardiovascular;  Laterality: N/A;  ? COLONOSCOPY    ? CYSTOCELE REPAIR    ? DENTAL SURGERY    ? infected tooth - general  ? DILATION AND CURETTAGE OF UTERUS    ? x several  ? ELECTROPHYSIOLOGIC STUDY N/A 11/28/2015  ? Procedure: Atrial Fibrillation Ablation;  Surgeon: [name], MD;  Location: [place] [facility];  Service: Cardiovascular;  Laterality: N/A;  ? EP IMPLANTABLE DEVICE N/A 06/08/2016  ? Procedure: Loop Recorder Insertion;  Surgeon: [name], MD;  Location: [place] [facility];  Service: Cardiovascular;  Laterality: N/A;  ?  EXAMINATION UNDER ANESTHESIA N/A 01/15/2014  ? Procedure: EXAM UNDER ANESTHESIA with evacuation of hematoma and over sewing of vaginal mucosa.;  Surgeon: Floyce Stakes. Pamala Hurry, MD;  Location: [place] [facility];  Service: Gynecology;  Laterality: N/A;  ? HAND SURGERY    ? left  ? hemorrhoid injection  04/2011  ? implantable loop recorder removal  08/06/2021  ? MDT Reveal LINQ removed by Dr Rayann Heman  ? JOINT REPLACEMENT    ? left thumb replacement  ? MYRINGOTOMY WITH TUBE PLACEMENT Right 10/27/2018  ? TEE WITHOUT CARDIOVERSION N/A 09/27/2017  ? Procedure: TRANSESOPHAGEAL ECHOCARDIOGRAM (TEE);  Surgeon: Sueanne Margarita, MD;  Location: [facility] ENDOSCOPY;  Service: Cardiovascular;  Laterality: N/A;  ? TONSILLECTOMY     ? ?Patient Active Problem List  ? Diagnosis Date Noted  ? Hypercoagulable state due to atrial fibrillation ([place]) 10/03/2021  ? Coronary artery disease involving native coronary artery of native heart without angina pectoris 09/12/2020  ? Atrial flutter ([place])   ? Persistent atrial fibrillation ([place]) 09/28/2017  ? PAF (paroxysmal atrial fibrillation) ([place]) 11/28/2015  ? First degree AV block 07/30/2014  ? Chronic anticoagulation 07/30/2014  ? Dizziness 07/30/2014  ? Post-op bleeding 01/15/2014  ? Cystocele 01/03/2014  ? Atrial fibrillation ([place]) 07/31/2013  ? Hypertension   ? Thyroid disease   ? Chronic kidney disease   ? Internal hemorrhoid   ? Rectal bleeding   ? Asthmatic bronchitis   ? ? ?REFERRING DIAG:  ?M54.2 (ICD-10-CM) - Cervicalgia ? ?THERAPY DIAG:  ?Cervicalgia ? ?Muscle weakness (generalized) ? ?Abnormal posture ? ?PERTINENT HISTORY:  ?Afib, HTN ? ?PRECAUTIONS:  ?None ? ?SUBJECTIVE:  ?Patient report she is doing well, she is consistent with her current HEP. ? ?Pain: ?Are you having pain? Yes ?NPRS scale: 0/10 resting pain, 1/61 with certain movement ?Pain location: R side of neck ?PAIN TYPE: sharp ?Pain description: intermittent  ?Aggravating factors: lying on R side, neck rotation ?Relieving factors: heat, Voltaren  ? ? ?OBJECTIVE:  ?PATIENT SURVEYS:  ?FOTO 46% function; 55% predicted ?  ?CERVICAL AROM/PROM ?  ?A/PROM A/PROM (deg) ?11/10/2021  ? ?12/01/2021  ?Flexion     ?Extension     ?Right rotation 40 p! 50 p! (55 post tx)  ?Left rotation 50 60  ? (Blank rows = not tested) ?  ?UE ROM: ?  ?AROM Right ?11/10/2021 Left ?11/10/2021  ?Shoulder flexion [facility] WFL  ?Shoulder extension      ?Shoulder abduction [facility] WFL  ?Shoulder internal rotation      ?Shoulder external rotation      ?Elbow flexion      ?Elbow extension      ?Wrist flexion      ?Wrist extension      ? (Blank rows = not tested) ?  ?UE MMT: ?  ?MMT Right ?11/10/2021 Left ?11/10/2021  ?Shoulder flexion [facility] WFL  ?Shoulder extension      ?Shoulder abduction [facility]  WFL  ?Shoulder adduction      ?Shoulder extension      ?Shoulder internal rotation      ?Shoulder external rotation      ?Middle trapezius 3+/5 3+/5  ?Lower trapezius 3+/5 3+/5  ?Elbow flexion      ?Elbow extension      ?Wrist flexion      ?Wrist extension      ?Wrist ulnar deviation      ?Wrist radial deviation      ?Wrist pronation      ?Wrist supination      ?Grip strength      ? (  Blank rows = not tested) ?  ?TODAY'S TREATMENT:  ?[facility] Adult PT Treatment:                                                DATE: 12/01/2021 ?Therapeutic Exercise: ?NuStep lvl 5 UE/LE x 5 min while taking subjective ?Supine cervical retraction 2 x 10 with 5 sec hold ?Supine serratus press with 4# 2 x 10 ?Supine horizontal abduction with green 2 x 10 ?Seated double ER and scap retraction with green 2 x 10 ?Cervical rotational SNAG to right 2 x 10 ?Row with green 2 x 15 ?Extension with green 2 x 10 ?Sidelying thoracic rotation with [place] x 10 each ?Manual Therapy: ?Suboccipital release with gentle manual traction ?STM to R upper trap ?Positional release to R upper trap ? ? ?[facility] Adult PT Treatment:                                                DATE: 11/26/2021 ?Therapeutic Exercise: ?NuStep lvl 5 UE/LE x 5 min while taking subjective ?AAROM cervical rotation x10 each way ?AAROM head nods x10 each way ?Row 3x12 GTB ?Seated horizontal abd 2x10 RTB ?Standing shoulder ext 2x10 GTB ?Paloff 2x10 Black TB ?Seated bilat ER with scap retraction 2x10 RTB ?Supine serratus punch 2x10 2# ?Supine chin tuck 2x10 - 5" hold ?Manual Therapy: ?Suboccipital release ?STM to R upper trap ?Positional release to R upper trap ? ?[facility] Adult PT Treatment:                                                DATE: 11/24/2021 ?Therapeutic Exercise: ?NuStep lvl 5 UE/LE x 6 min while taking subjective ?AAROM cervical rotation x10 each way ?AAROM head nods x10 each way ?Row 2x12 BlueTB ?Palloff press 3# cable x10 BIL ?Upper trap stretch x 30" BIL ?Levator stretch x 30" BIL ?Seated  horizontal abd 2x10 RTB ?Supine serratus raise x 10 YTB ?Manual Therapy: ?Suboccipital release ?STM to R upper trap ?Positional release to R upper trap ? ?PATIENT EDUCATION:  ?Education details: HEP update ?P

## 2021-12-01 NOTE — Patient Instructions (Signed)
Access Code: [email or web address] ?URL: [email or web address] ?Date: 12/01/2021 ?Prepared by: Hilda Blades ? ?Exercises ?- Seated Upper Trapezius Stretch  - 1 x daily - 7 x weekly - 3 reps - 30 second hold ?- Seated Scapular Retraction  - 1 x daily - 7 x weekly - 2 sets - 10 reps ?- Standing Shoulder Row with Anchored Resistance  - 1 x daily - 7 x weekly - 3 sets - 10 reps ?- Seated Passive Cervical Retraction  - 1 x daily - 7 x weekly - 2 sets - 10 reps - 5 sec hold ?- Seated Assisted Cervical Rotation with Towel  - 1 x daily - 7 x weekly - 2 sets - 10 reps ?

## 2021-12-03 ENCOUNTER — Ambulatory Visit: Payer: Medicare Other

## 2021-12-03 DIAGNOSIS — M6281 Muscle weakness (generalized): Secondary | ICD-10-CM | POA: Diagnosis not present

## 2021-12-03 DIAGNOSIS — R293 Abnormal posture: Secondary | ICD-10-CM | POA: Diagnosis not present

## 2021-12-03 DIAGNOSIS — M542 Cervicalgia: Secondary | ICD-10-CM

## 2021-12-03 NOTE — Therapy (Signed)
?OUTPATIENT PHYSICAL THERAPY TREATMENT NOTE ? ? ?Patient Name: [name] ?MRN: [id number] ?DOB:Jul 16, 1934, 88 y.o., female ?Today's Date: 12/03/2021 ? ?PCP: Lajean Manes, MD ?REFERRING PROVIDER: Lajean Manes, MD ? ? PT End of Session - 12/03/21 1358   ? ? Visit Number 7   ? Number of Visits 17   ? Date for PT Re-Evaluation 01/05/22   ? Authorization Type Medicare   ? Progress Note Due on Visit 10   ? PT Start Time 1358   ? PT Stop Time 8099   ? PT Time Calculation (min) 45 min   ? Activity Tolerance Patient tolerated treatment well   ? Behavior During Therapy [facility] for tasks assessed/performed   ? ?  ?  ? ?  ? ? ? ? ? ? ? ?Past Medical History:  ?Diagnosis Date  ? Arthritis   ? hands, back  ? Asthmatic bronchitis   ? Atrial fibrillation ([place]) 07/31/2013  ? Chronic anticoagulation 07/30/2014  ? Chronic kidney disease   ? stage 3 renal disease - no med  ? Dizziness 07/30/2014  ? Dysrhythmia   ? Hx - a-fib 05/2010 - tx with meds, no problem since 05/2010  ? First degree AV block 07/30/2014  ? Hearing loss   ? bilateral hearing aids  ? Hypertension   ? controlled with meds  ? Hypothyroidism   ? Internal hemorrhoid   ? PAF (paroxysmal atrial fibrillation) ([place]) 11/28/2015  ? Paroxysmal atrial fibrillation (HCC)   ? Peripheral vascular disease ([place])   ? right arm and right shoulder blood clots r/t a fall  ? Persistent atrial fibrillation ([place]) 09/28/2017  ? Rectal bleeding   ? SVD (spontaneous vaginal delivery) 1957, 1959  ? x 2  ? Thyroid disease   ? hypothyroidism  ? ?Past Surgical History:  ?Procedure Laterality Date  ? ABDOMINAL HYSTERECTOMY    ? and right ovary  ? ANTERIOR AND POSTERIOR REPAIR N/A 01/03/2014  ? Procedure: ANTERIOR (CYSTOCELE) REPAIR;  Surgeon: Floyce Stakes. Pamala Hurry, MD;  Location: [place] [facility];  Service: Gynecology;  Laterality: N/A;  ? APPENDECTOMY    ? ATRIAL FIBRILLATION ABLATION N/A 09/28/2017  ? Procedure: ATRIAL FIBRILLATION ABLATION;  Surgeon: [name], MD;  Location: [place] [facility];   Service: Cardiovascular;  Laterality: N/A;  ? BACK SURGERY    ? x 3 - 2 rods and 8 screws  ? CARDIOVERSION N/A 08/24/2017  ? Procedure: CARDIOVERSION;  Surgeon: Jerline Pain, MD;  Location: [facility] ENDOSCOPY;  Service: Cardiovascular;  Laterality: N/A;  ? CARDIOVERSION N/A 10/19/2017  ? Procedure: CARDIOVERSION;  Surgeon: Josue Hector, MD;  Location: [facility] ENDOSCOPY;  Service: Cardiovascular;  Laterality: N/A;  ? CARDIOVERSION N/A 10/04/2019  ? Procedure: CARDIOVERSION;  Surgeon: Pixie Casino, MD;  Location: [place];  Service: Cardiovascular;  Laterality: N/A;  ? CARDIOVERSION N/A 08/20/2021  ? Procedure: CARDIOVERSION;  Surgeon: Jerline Pain, MD;  Location: [facility] ENDOSCOPY;  Service: Cardiovascular;  Laterality: N/A;  ? COLONOSCOPY    ? CYSTOCELE REPAIR    ? DENTAL SURGERY    ? infected tooth - general  ? DILATION AND CURETTAGE OF UTERUS    ? x several  ? ELECTROPHYSIOLOGIC STUDY N/A 11/28/2015  ? Procedure: Atrial Fibrillation Ablation;  Surgeon: [name], MD;  Location: [place] [facility];  Service: Cardiovascular;  Laterality: N/A;  ? EP IMPLANTABLE DEVICE N/A 06/08/2016  ? Procedure: Loop Recorder Insertion;  Surgeon: [name], MD;  Location: [place] [facility];  Service: Cardiovascular;  Laterality:  N/A;  ? EXAMINATION UNDER ANESTHESIA N/A 01/15/2014  ? Procedure: EXAM UNDER ANESTHESIA with evacuation of hematoma and over sewing of vaginal mucosa.;  Surgeon: Floyce Stakes. Pamala Hurry, MD;  Location: [place] [facility];  Service: Gynecology;  Laterality: N/A;  ? HAND SURGERY    ? left  ? hemorrhoid injection  04/2011  ? implantable loop recorder removal  08/06/2021  ? MDT Reveal LINQ removed by Dr Rayann Heman  ? JOINT REPLACEMENT    ? left thumb replacement  ? MYRINGOTOMY WITH TUBE PLACEMENT Right 10/27/2018  ? TEE WITHOUT CARDIOVERSION N/A 09/27/2017  ? Procedure: TRANSESOPHAGEAL ECHOCARDIOGRAM (TEE);  Surgeon: Sueanne Margarita, MD;  Location: [facility] ENDOSCOPY;  Service: Cardiovascular;  Laterality: N/A;  ? TONSILLECTOMY     ? ?Patient Active Problem List  ? Diagnosis Date Noted  ? Hypercoagulable state due to atrial fibrillation ([place]) 10/03/2021  ? Coronary artery disease involving native coronary artery of native heart without angina pectoris 09/12/2020  ? Atrial flutter ([place])   ? Persistent atrial fibrillation ([place]) 09/28/2017  ? PAF (paroxysmal atrial fibrillation) ([place]) 11/28/2015  ? First degree AV block 07/30/2014  ? Chronic anticoagulation 07/30/2014  ? Dizziness 07/30/2014  ? Post-op bleeding 01/15/2014  ? Cystocele 01/03/2014  ? Atrial fibrillation ([place]) 07/31/2013  ? Hypertension   ? Thyroid disease   ? Chronic kidney disease   ? Internal hemorrhoid   ? Rectal bleeding   ? Asthmatic bronchitis   ? ? ?REFERRING DIAG:  ?M54.2 (ICD-10-CM) - Cervicalgia ? ?THERAPY DIAG:  ?Cervicalgia ? ?Muscle weakness (generalized) ? ?Abnormal posture ? ?PERTINENT HISTORY:  ?Afib, HTN ? ?PRECAUTIONS:  ?None ? ?SUBJECTIVE: Patient report she is doing well, she is consistent with her current HEP. ? ?Pain: ?Are you having pain? Yes ?NPRS scale: 0/10 resting pain, 3/87 with certain movement ?Pain location: R side of neck ?PAIN TYPE: sharp ?Pain description: intermittent  ?Aggravating factors: lying on R side, neck rotation ?Relieving factors: heat, Voltaren  ? ? ?OBJECTIVE:  ?PATIENT SURVEYS:  ?FOTO 46% function; 55% predicted ?12/03/2021: 50% ?  ?CERVICAL AROM/PROM ?  ?A/PROM A/PROM (deg) ?11/10/2021  ? ?12/01/2021  ?Flexion     ?Extension     ?Right rotation 40 p! 50 p! (55 post tx)  ?Left rotation 50 60  ? (Blank rows = not tested) ?  ?UE ROM: ?  ?AROM Right ?11/10/2021 Left ?11/10/2021  ?Shoulder flexion [facility] WFL  ?Shoulder extension      ?Shoulder abduction [facility] WFL  ?Shoulder internal rotation      ?Shoulder external rotation      ?Elbow flexion      ?Elbow extension      ?Wrist flexion      ?Wrist extension      ? (Blank rows = not tested) ?  ?UE MMT: ?  ?MMT Right ?11/10/2021 Left ?11/10/2021  ?Shoulder flexion [facility] WFL  ?Shoulder extension      ?Shoulder  abduction [facility] WFL  ?Shoulder adduction      ?Shoulder extension      ?Shoulder internal rotation      ?Shoulder external rotation      ?Middle trapezius 3+/5 3+/5  ?Lower trapezius 3+/5 3+/5  ?Elbow flexion      ?Elbow extension      ?Wrist flexion      ?Wrist extension      ?Wrist ulnar deviation      ?Wrist radial deviation      ?Wrist pronation      ?Wrist supination      ?  Grip strength      ? (Blank rows = not tested) ?  ?TODAY'S TREATMENT:  ?[facility] Adult PT Treatment:                                                DATE: 12/03/2021 ?Therapeutic Exercise: ?NuStep lvl 6 UE/LE x 5 min while taking subjective ?Supine serratus press with 4# 2 x 10 ?Supine horizontal abduction with green 2 x 10 ?Supine diagonals GTB 2x10 BIL ?Seated double ER and scap retraction with green 2 x 10 ?Row with green 2 x 10 ?Extension with green 2 x 10 ?Manual Therapy: ?Suboccipital release with gentle manual traction ?STM to R upper trap ?Positional release to R upper trap ? ? ?[facility] Adult PT Treatment:                                                DATE: 12/01/2021 ?Therapeutic Exercise: ?NuStep lvl 5 UE/LE x 5 min while taking subjective ?Supine cervical retraction 2 x 10 with 5 sec hold ?Supine serratus press with 4# 2 x 10 ?Supine horizontal abduction with green 2 x 10 ?Seated double ER and scap retraction with green 2 x 10 ?Cervical rotational SNAG to right 2 x 10 ?Row with green 2 x 15 ?Extension with green 2 x 10 ?Sidelying thoracic rotation with [place] x 10 each ?Manual Therapy: ?Suboccipital release with gentle manual traction ?STM to R upper trap ?Positional release to R upper trap ? ? ?[facility] Adult PT Treatment:                                                DATE: 11/26/2021 ?Therapeutic Exercise: ?NuStep lvl 5 UE/LE x 5 min while taking subjective ?AAROM cervical rotation x10 each way ?AAROM head nods x10 each way ?Row 3x12 GTB ?Seated horizontal abd 2x10 RTB ?Standing shoulder ext 2x10 GTB ?Paloff 2x10 Black TB ?Seated bilat ER with  scap retraction 2x10 RTB ?Supine serratus punch 2x10 2# ?Supine chin tuck 2x10 - 5" hold ?Manual Therapy: ?Suboccipital release ?STM to R upper trap ?Positional release to R upper trap ? ? ? ?PATIENT EDUCATION

## 2021-12-08 ENCOUNTER — Ambulatory Visit: Payer: Medicare Other

## 2021-12-08 NOTE — Therapy (Incomplete)
?OUTPATIENT PHYSICAL THERAPY TREATMENT NOTE ? ? ?Patient Name: [name] ?MRN: [id number] ?DOB:1934/03/28, 88 y.o., female ?Today's Date: 12/08/2021 ? ?PCP: Lajean Manes, MD ?REFERRING PROVIDER: Lajean Manes, MD ? ? ? ? ? ? ? ? ? ?Past Medical History:  ?Diagnosis Date  ? Arthritis   ? hands, back  ? Asthmatic bronchitis   ? Atrial fibrillation ([place]) 07/31/2013  ? Chronic anticoagulation 07/30/2014  ? Chronic kidney disease   ? stage 3 renal disease - no med  ? Dizziness 07/30/2014  ? Dysrhythmia   ? Hx - a-fib 05/2010 - tx with meds, no problem since 05/2010  ? First degree AV block 07/30/2014  ? Hearing loss   ? bilateral hearing aids  ? Hypertension   ? controlled with meds  ? Hypothyroidism   ? Internal hemorrhoid   ? PAF (paroxysmal atrial fibrillation) ([place]) 11/28/2015  ? Paroxysmal atrial fibrillation ([facility])   ? Peripheral vascular disease ([place])   ? right arm and right shoulder blood clots r/t a fall  ? Persistent atrial fibrillation ([place]) 09/28/2017  ? Rectal bleeding   ? SVD (spontaneous vaginal delivery) 1957, 1959  ? x 2  ? Thyroid disease   ? hypothyroidism  ? ?Past Surgical History:  ?Procedure Laterality Date  ? ABDOMINAL HYSTERECTOMY    ? and right ovary  ? ANTERIOR AND POSTERIOR REPAIR N/A 01/03/2014  ? Procedure: ANTERIOR (CYSTOCELE) REPAIR;  Surgeon: Floyce Stakes. Pamala Hurry, MD;  Location: [place] [facility];  Service: Gynecology;  Laterality: N/A;  ? APPENDECTOMY    ? ATRIAL FIBRILLATION ABLATION N/A 09/28/2017  ? Procedure: ATRIAL FIBRILLATION ABLATION;  Surgeon: [name], MD;  Location: [place] [facility];  Service: Cardiovascular;  Laterality: N/A;  ? BACK SURGERY    ? x 3 - 2 rods and 8 screws  ? CARDIOVERSION N/A 08/24/2017  ? Procedure: CARDIOVERSION;  Surgeon: Jerline Pain, MD;  Location: [facility] ENDOSCOPY;  Service: Cardiovascular;  Laterality: N/A;  ? CARDIOVERSION N/A 10/19/2017  ? Procedure: CARDIOVERSION;  Surgeon: Josue Hector, MD;  Location: [facility] ENDOSCOPY;  Service: Cardiovascular;   Laterality: N/A;  ? CARDIOVERSION N/A 10/04/2019  ? Procedure: CARDIOVERSION;  Surgeon: Pixie Casino, MD;  Location: [place];  Service: Cardiovascular;  Laterality: N/A;  ? CARDIOVERSION N/A 08/20/2021  ? Procedure: CARDIOVERSION;  Surgeon: Jerline Pain, MD;  Location: [facility] ENDOSCOPY;  Service: Cardiovascular;  Laterality: N/A;  ? COLONOSCOPY    ? CYSTOCELE REPAIR    ? DENTAL SURGERY    ? infected tooth - general  ? DILATION AND CURETTAGE OF UTERUS    ? x several  ? ELECTROPHYSIOLOGIC STUDY N/A 11/28/2015  ? Procedure: Atrial Fibrillation Ablation;  Surgeon: [name], MD;  Location: [place] [facility];  Service: Cardiovascular;  Laterality: N/A;  ? EP IMPLANTABLE DEVICE N/A 06/08/2016  ? Procedure: Loop Recorder Insertion;  Surgeon: [name], MD;  Location: [place] [facility];  Service: Cardiovascular;  Laterality: N/A;  ? EXAMINATION UNDER ANESTHESIA N/A 01/15/2014  ? Procedure: EXAM UNDER ANESTHESIA with evacuation of hematoma and over sewing of vaginal mucosa.;  Surgeon: Floyce Stakes. Pamala Hurry, MD;  Location: [place] [facility];  Service: Gynecology;  Laterality: N/A;  ? HAND SURGERY    ? left  ? hemorrhoid injection  04/2011  ? implantable loop recorder removal  08/06/2021  ? MDT Reveal LINQ removed by Dr Rayann Heman  ? JOINT REPLACEMENT    ? left thumb replacement  ? MYRINGOTOMY WITH TUBE PLACEMENT Right 10/27/2018  ? TEE WITHOUT CARDIOVERSION  N/A 09/27/2017  ? Procedure: TRANSESOPHAGEAL ECHOCARDIOGRAM (TEE);  Surgeon: Sueanne Margarita, MD;  Location: [facility] ENDOSCOPY;  Service: Cardiovascular;  Laterality: N/A;  ? TONSILLECTOMY    ? ?Patient Active Problem List  ? Diagnosis Date Noted  ? Hypercoagulable state due to atrial fibrillation ([place]) 10/03/2021  ? Coronary artery disease involving native coronary artery of native heart without angina pectoris 09/12/2020  ? Atrial flutter ([place])   ? Persistent atrial fibrillation ([place]) 09/28/2017  ? PAF (paroxysmal atrial fibrillation) ([place]) 11/28/2015  ? First degree AV block  07/30/2014  ? Chronic anticoagulation 07/30/2014  ? Dizziness 07/30/2014  ? Post-op bleeding 01/15/2014  ? Cystocele 01/03/2014  ? Atrial fibrillation ([place]) 07/31/2013  ? Hypertension   ? Thyroid disease   ? Chronic kidney disease   ? Internal hemorrhoid   ? Rectal bleeding   ? Asthmatic bronchitis   ? ? ?REFERRING DIAG:  ?M54.2 (ICD-10-CM) - Cervicalgia ? ?THERAPY DIAG:  ?No diagnosis found. ? ?PERTINENT HISTORY:  ?Afib, HTN ? ?PRECAUTIONS:  ?None ? ?SUBJECTIVE:  ?*** ? ?Pain: ?Are you having pain? Yes ?NPRS scale: 0/10 resting pain, 0/97 with certain movement ?Pain location: R side of neck ?PAIN TYPE: sharp ?Pain description: intermittent  ?Aggravating factors: lying on R side, neck rotation ?Relieving factors: heat, Voltaren  ? ? ?OBJECTIVE:  ?PATIENT SURVEYS:  ?FOTO 46% function; 55% predicted ?12/03/2021: 50% ?  ?CERVICAL AROM/PROM ?  ?A/PROM A/PROM (deg) ?11/10/2021  ? ?12/01/2021  ?Flexion     ?Extension     ?Right rotation 40 p! 50 p! (55 post tx)  ?Left rotation 50 60  ? (Blank rows = not tested) ?  ?UE ROM: ?  ?AROM Right ?11/10/2021 Left ?11/10/2021  ?Shoulder flexion [facility] WFL  ?Shoulder extension      ?Shoulder abduction [facility] WFL  ?Shoulder internal rotation      ?Shoulder external rotation      ?Elbow flexion      ?Elbow extension      ?Wrist flexion      ?Wrist extension      ? (Blank rows = not tested) ?  ?UE MMT: ?  ?MMT Right ?11/10/2021 Left ?11/10/2021  ?Shoulder flexion [facility] WFL  ?Shoulder extension      ?Shoulder abduction [facility] WFL  ?Shoulder adduction      ?Shoulder extension      ?Shoulder internal rotation      ?Shoulder external rotation      ?Middle trapezius 3+/5 3+/5  ?Lower trapezius 3+/5 3+/5  ?Elbow flexion      ?Elbow extension      ?Wrist flexion      ?Wrist extension      ?Wrist ulnar deviation      ?Wrist radial deviation      ?Wrist pronation      ?Wrist supination      ?Grip strength      ? (Blank rows = not tested) ?  ?TODAY'S TREATMENT:  ?[facility] Adult PT Treatment:                                                 DATE: 12/08/2021 ?Therapeutic Exercise: ?NuStep lvl 6 UE/LE x 5 min while taking subjective ?Supine serratus press with 4# 2 x 10 ?Supine horizontal abduction with green 2 x 10 ?Supine diagonals GTB 2x10 BIL ?Seated double ER and scap retraction with green  2 x 10 ?Row with green 2 x 10 ?Extension with green 2 x 10 ?Manual Therapy: ?Suboccipital release with gentle manual traction ?STM to R upper trap ?Positional release to R upper trap ? ?[facility] Adult PT Treatment:                                                DATE: 12/03/2021 ?Therapeutic Exercise: ?NuStep lvl 6 UE/LE x 5 min while taking subjective ?Supine serratus press with 4# 2 x 10 ?Supine horizontal abduction with green 2 x 10 ?Supine diagonals GTB 2x10 BIL ?Seated double ER and scap retraction with green 2 x 10 ?Row with green 2 x 10 ?Extension with green 2 x 10 ?Manual Therapy: ?Suboccipital release with gentle manual traction ?STM to R upper trap ?Positional release to R upper trap ? ? ?[facility] Adult PT Treatment:                                                DATE: 12/01/2021 ?Therapeutic Exercise: ?NuStep lvl 5 UE/LE x 5 min while taking subjective ?Supine cervical retraction 2 x 10 with 5 sec hold ?Supine serratus press with 4# 2 x 10 ?Supine horizontal abduction with green 2 x 10 ?Seated double ER and scap retraction with green 2 x 10 ?Cervical rotational SNAG to right 2 x 10 ?Row with green 2 x 15 ?Extension with green 2 x 10 ?Sidelying thoracic rotation with Francis x 10 each ?Manual Therapy: ?Suboccipital release with gentle manual traction ?STM to R upper trap ?Positional release to R upper trap ? ? ?[facility] Adult PT Treatment:                                                DATE: 11/26/2021 ?Therapeutic Exercise: ?NuStep lvl 5 UE/LE x 5 min while taking subjective ?AAROM cervical rotation x10 each way ?AAROM head nods x10 each way ?Row 3x12 GTB ?Seated horizontal abd 2x10 RTB ?Standing shoulder ext 2x10 GTB ?Paloff 2x10 Black TB ?Seated bilat ER  with scap retraction 2x10 RTB ?Supine serratus punch 2x10 2# ?Supine chin tuck 2x10 - 5" hold ?Manual Therapy: ?Suboccipital release ?STM to R upper trap ?Positional release to R upper trap ? ? ? ?PATIENT EDUCATIO

## 2021-12-10 ENCOUNTER — Ambulatory Visit: Payer: Medicare Other | Attending: Geriatric Medicine

## 2021-12-10 DIAGNOSIS — M542 Cervicalgia: Secondary | ICD-10-CM | POA: Insufficient documentation

## 2021-12-10 DIAGNOSIS — H6992 Unspecified Eustachian tube disorder, left ear: Secondary | ICD-10-CM | POA: Diagnosis not present

## 2021-12-10 DIAGNOSIS — R300 Dysuria: Secondary | ICD-10-CM | POA: Diagnosis not present

## 2021-12-10 DIAGNOSIS — M6281 Muscle weakness (generalized): Secondary | ICD-10-CM | POA: Diagnosis not present

## 2021-12-10 NOTE — Therapy (Signed)
?OUTPATIENT PHYSICAL THERAPY TREATMENT NOTE/DISCHARGE ? ?PHYSICAL THERAPY DISCHARGE SUMMARY ? ?Visits from Start of Care: 8 ? ?Current functional level related to goals / functional outcomes: ?See goals and objective  ?  ?Remaining deficits: ?See goals and objective ?  ?Education / Equipment: ?HEP and discharge plan  ? ?Patient agrees to discharge. Patient goals were met. Patient is being discharged due to meeting the stated rehab goals.  ? ?Patient Name: Frances [name] ?MRN: [id number] ?DOB:October 25, 1933, 88 y.o., female ?Today's Date: 12/10/2021 ? ?PCP: Lajean Manes, MD ?REFERRING PROVIDER: Lajean Manes, MD ? ? PT End of Session - 12/10/21 1354   ? ? Visit Number 8   ? Number of Visits 17   ? Date for PT Re-Evaluation 01/05/22   ? Authorization Type Medicare   ? Progress Note Due on Visit 10   ? PT Start Time 1400   ? PT Stop Time 1438   ? PT Time Calculation (min) 38 min   ? Activity Tolerance Patient tolerated treatment well   ? Behavior During Therapy [facility] for tasks assessed/performed   ? ?  ?  ? ?  ? ? ? ? ? ? ? ? ?Past Medical History:  ?Diagnosis Date  ? Arthritis   ? hands, back  ? Asthmatic bronchitis   ? Atrial fibrillation ([place]) 07/31/2013  ? Chronic anticoagulation 07/30/2014  ? Chronic kidney disease   ? stage 3 renal disease - no med  ? Dizziness 07/30/2014  ? Dysrhythmia   ? Hx - a-fib 05/2010 - tx with meds, no problem since 05/2010  ? First degree AV block 07/30/2014  ? Hearing loss   ? bilateral hearing aids  ? Hypertension   ? controlled with meds  ? Hypothyroidism   ? Internal hemorrhoid   ? PAF (paroxysmal atrial fibrillation) ([place]) 11/28/2015  ? Paroxysmal atrial fibrillation ([facility])   ? Peripheral vascular disease ([place])   ? right arm and right shoulder blood clots r/t a fall  ? Persistent atrial fibrillation ([place]) 09/28/2017  ? Rectal bleeding   ? SVD (spontaneous vaginal delivery) 1957, 1959  ? x 2  ? Thyroid disease   ? hypothyroidism  ? ?Past Surgical History:  ?Procedure Laterality Date  ?  ABDOMINAL HYSTERECTOMY    ? and right ovary  ? ANTERIOR AND POSTERIOR REPAIR N/A 01/03/2014  ? Procedure: ANTERIOR (CYSTOCELE) REPAIR;  Surgeon: Floyce Stakes. Pamala Hurry, MD;  Location: [place] [facility];  Service: Gynecology;  Laterality: N/A;  ? APPENDECTOMY    ? ATRIAL FIBRILLATION ABLATION N/A 09/28/2017  ? Procedure: ATRIAL FIBRILLATION ABLATION;  Surgeon: [name], MD;  Location: [place] [facility];  Service: Cardiovascular;  Laterality: N/A;  ? BACK SURGERY    ? x 3 - 2 rods and 8 screws  ? CARDIOVERSION N/A 08/24/2017  ? Procedure: CARDIOVERSION;  Surgeon: Jerline Pain, MD;  Location: [facility] ENDOSCOPY;  Service: Cardiovascular;  Laterality: N/A;  ? CARDIOVERSION N/A 10/19/2017  ? Procedure: CARDIOVERSION;  Surgeon: Josue Hector, MD;  Location: [facility] ENDOSCOPY;  Service: Cardiovascular;  Laterality: N/A;  ? CARDIOVERSION N/A 10/04/2019  ? Procedure: CARDIOVERSION;  Surgeon: Pixie Casino, MD;  Location: [place];  Service: Cardiovascular;  Laterality: N/A;  ? CARDIOVERSION N/A 08/20/2021  ? Procedure: CARDIOVERSION;  Surgeon: Jerline Pain, MD;  Location: [facility] ENDOSCOPY;  Service: Cardiovascular;  Laterality: N/A;  ? COLONOSCOPY    ? CYSTOCELE REPAIR    ? DENTAL SURGERY    ? infected tooth - general  ? DILATION AND CURETTAGE  OF UTERUS    ? x several  ? ELECTROPHYSIOLOGIC STUDY N/A 11/28/2015  ? Procedure: Atrial Fibrillation Ablation;  Surgeon: [name], MD;  Location: [place] [facility];  Service: Cardiovascular;  Laterality: N/A;  ? EP IMPLANTABLE DEVICE N/A 06/08/2016  ? Procedure: Loop Recorder Insertion;  Surgeon: [name], MD;  Location: [place] [facility];  Service: Cardiovascular;  Laterality: N/A;  ? EXAMINATION UNDER ANESTHESIA N/A 01/15/2014  ? Procedure: EXAM UNDER ANESTHESIA with evacuation of hematoma and over sewing of vaginal mucosa.;  Surgeon: Floyce Stakes. Pamala Hurry, MD;  Location: [place] [facility];  Service: Gynecology;  Laterality: N/A;  ? HAND SURGERY    ? left  ? hemorrhoid injection  04/2011  ?  implantable loop recorder removal  08/06/2021  ? MDT Reveal LINQ removed by Dr Rayann Heman  ? JOINT REPLACEMENT    ? left thumb replacement  ? MYRINGOTOMY WITH TUBE PLACEMENT Right 10/27/2018  ? TEE WITHOUT CARDIOVERSION N/A 09/27/2017  ? Procedure: TRANSESOPHAGEAL ECHOCARDIOGRAM (TEE);  Surgeon: Sueanne Margarita, MD;  Location: [facility] ENDOSCOPY;  Service: Cardiovascular;  Laterality: N/A;  ? TONSILLECTOMY    ? ?Patient Active Problem List  ? Diagnosis Date Noted  ? Hypercoagulable state due to atrial fibrillation ([place]) 10/03/2021  ? Coronary artery disease involving native coronary artery of native heart without angina pectoris 09/12/2020  ? Atrial flutter ([place])   ? Persistent atrial fibrillation ([place]) 09/28/2017  ? PAF (paroxysmal atrial fibrillation) ([place]) 11/28/2015  ? First degree AV block 07/30/2014  ? Chronic anticoagulation 07/30/2014  ? Dizziness 07/30/2014  ? Post-op bleeding 01/15/2014  ? Cystocele 01/03/2014  ? Atrial fibrillation ([place]) 07/31/2013  ? Hypertension   ? Thyroid disease   ? Chronic kidney disease   ? Internal hemorrhoid   ? Rectal bleeding   ? Asthmatic bronchitis   ? ? ?REFERRING DIAG:  ?M54.2 (ICD-10-CM) - Cervicalgia ? ?THERAPY DIAG:  ?Cervicalgia ? ?Muscle weakness (generalized) ? ?PERTINENT HISTORY:  ?Afib, HTN ? ?PRECAUTIONS:  ?None ? ?SUBJECTIVE:  ?Pt presents to PT with no current pain. Has been compliant with HEP with no adverse effect. Pt is ready to begin PT at this time. ? ?Pain: ?Are you having pain? Yes ?NPRS scale: 0/10 resting pain ?Pain location: R side of neck ?PAIN TYPE: sharp ?Pain description: intermittent  ?Aggravating factors: lying on R side, neck rotation ?Relieving factors: heat, Voltaren  ? ? ?OBJECTIVE:  ?PATIENT SURVEYS:  ?FOTO 46% function; 55% predicted ?12/03/2021: 50% ?12/10/2021: 56% function ?  ?CERVICAL AROM/PROM ?  ?A/PROM A/PROM (deg) ?11/10/2021 AROM ? ?12/01/2021 AROM  ?Flexion      ?Extension      ?Right rotation 40 p! 50 p! (55 post tx) 60   ?Left rotation 50 60 65   ? (Blank rows = not tested) ?  ?UE ROM: ?  ?AROM Right ?11/10/2021 Left ?11/10/2021  ?Shoulder flexion [facility] WFL  ?Shoulder extension      ?Shoulder abduction [facility] WFL  ?Shoulder internal rotation      ?Shoulder external rotation      ?Elbow flexion      ?Elbow extension      ?Wrist flexion      ?Wrist extension      ? (Blank rows = not tested) ?  ?UE MMT: ?  ?MMT Right ?11/10/2021 Left ?11/10/2021  ?Shoulder flexion [facility] WFL  ?Shoulder extension      ?Shoulder abduction [facility] WFL  ?Shoulder adduction      ?Shoulder extension      ?Shoulder internal  rotation      ?Shoulder external rotation      ?Middle trapezius 3+/5 3+/5  ?Lower trapezius 3+/5 3+/5  ?Elbow flexion      ?Elbow extension      ?Wrist flexion      ?Wrist extension      ?Wrist ulnar deviation      ?Wrist radial deviation      ?Wrist pronation      ?Wrist supination      ?Grip strength      ? (Blank rows = not tested) ?  ?TODAY'S TREATMENT:  ?[facility] Adult PT Treatment:                                                DATE: 12/10/2021 ?Therapeutic Exercise: ?NuStep lvl 5 UE/LE x 4 min while taking subjective ?Upper trap stretch x 30" each ?Levator stretch x 30" each ?Row BTB x 10 ?Seated chin tuck x 10 ?Cervical SNAG x 10 L ?Therapeutic Activity: ?Assessment of tests/measures, goals, and outcomes for discharge ?Manual Therapy: ?Suboccipital release with gentle manual traction ?STM to R upper trap ?Positional release to R upper trap ? ?[facility] Adult PT Treatment:                                                DATE: 12/03/2021 ?Therapeutic Exercise: ?NuStep lvl 6 UE/LE x 5 min while taking subjective ?Supine serratus press with 4# 2 x 10 ?Supine horizontal abduction with green 2 x 10 ?Supine diagonals GTB 2x10 BIL ?Seated double ER and scap retraction with green 2 x 10 ?Row with green 2 x 10 ?Extension with green 2 x 10 ?Manual Therapy: ?Suboccipital release with gentle manual traction ?STM to R upper trap ?Positional release to R upper trap ? ?PATIENT EDUCATION:  ?Education  details: HEP update ?Person educated: Patient ?Education method: Explanation, Demonstration, Handout ?Education comprehension: verbalized understanding and returned demonstration ?  ?HOME EXERCISE PROGRAM: ?Access

## 2021-12-25 DIAGNOSIS — I480 Paroxysmal atrial fibrillation: Secondary | ICD-10-CM | POA: Diagnosis not present

## 2021-12-25 DIAGNOSIS — N1831 Chronic kidney disease, stage 3a: Secondary | ICD-10-CM | POA: Diagnosis not present

## 2021-12-25 DIAGNOSIS — D6869 Other thrombophilia: Secondary | ICD-10-CM | POA: Diagnosis not present

## 2021-12-25 DIAGNOSIS — I129 Hypertensive chronic kidney disease with stage 1 through stage 4 chronic kidney disease, or unspecified chronic kidney disease: Secondary | ICD-10-CM | POA: Diagnosis not present

## 2021-12-25 DIAGNOSIS — M79662 Pain in left lower leg: Secondary | ICD-10-CM | POA: Diagnosis not present

## 2022-01-02 DIAGNOSIS — M79662 Pain in left lower leg: Secondary | ICD-10-CM | POA: Diagnosis not present

## 2022-01-20 DIAGNOSIS — I129 Hypertensive chronic kidney disease with stage 1 through stage 4 chronic kidney disease, or unspecified chronic kidney disease: Secondary | ICD-10-CM | POA: Diagnosis not present

## 2022-01-20 DIAGNOSIS — N1831 Chronic kidney disease, stage 3a: Secondary | ICD-10-CM | POA: Diagnosis not present

## 2022-01-27 DIAGNOSIS — H6521 Chronic serous otitis media, right ear: Secondary | ICD-10-CM | POA: Diagnosis not present

## 2022-01-27 DIAGNOSIS — H9011 Conductive hearing loss, unilateral, right ear, with unrestricted hearing on the contralateral side: Secondary | ICD-10-CM | POA: Diagnosis not present

## 2022-02-03 DIAGNOSIS — H6521 Chronic serous otitis media, right ear: Secondary | ICD-10-CM | POA: Diagnosis not present

## 2022-02-07 ENCOUNTER — Other Ambulatory Visit (HOSPITAL_COMMUNITY): Payer: Self-pay | Admitting: Internal Medicine

## 2022-02-07 DIAGNOSIS — I4819 Other persistent atrial fibrillation: Secondary | ICD-10-CM

## 2022-02-09 NOTE — Telephone Encounter (Signed)
Prescription refill request for Eliquis received. Indication: Afib  Last office visit: 10/03/21 Marlou Porch)  Scr: 1.03 (07/01/21)  Age: 88 Weight: 81.2kg  Appropriate dose and refill sent to requested pharmacy.

## 2022-02-17 ENCOUNTER — Encounter (HOSPITAL_COMMUNITY): Payer: Self-pay | Admitting: Nurse Practitioner

## 2022-02-17 ENCOUNTER — Ambulatory Visit (HOSPITAL_COMMUNITY)
Admission: RE | Admit: 2022-02-17 | Discharge: 2022-02-17 | Disposition: A | Discharge status: Home / Self Care | Payer: Medicare Other | Source: Ambulatory Visit | Attending: Nurse Practitioner | Admitting: Nurse Practitioner

## 2022-02-17 VITALS — BP 150/66 | HR 67 | Ht 65.0 in | Wt 181.8 lb

## 2022-02-17 DIAGNOSIS — Z7901 Long term (current) use of anticoagulants: Secondary | ICD-10-CM | POA: Insufficient documentation

## 2022-02-17 DIAGNOSIS — Z79899 Other long term (current) drug therapy: Secondary | ICD-10-CM | POA: Diagnosis not present

## 2022-02-17 DIAGNOSIS — D6869 Other thrombophilia: Secondary | ICD-10-CM | POA: Diagnosis not present

## 2022-02-17 DIAGNOSIS — I4819 Other persistent atrial fibrillation: Secondary | ICD-10-CM | POA: Insufficient documentation

## 2022-02-17 NOTE — Progress Notes (Signed)
Due to national recommendations of social distancing due to [place] 19, Audio/video telehealth visit is felt to be most appropriate for this patient at this time.  See MyChart message/consent below  from today for patient consent regarding telehealth for the [facility].    Date:  02/17/2022   ID:  [name], DOB Feb 03, 1934, MRN [id number]  Location: Office  Provider location: [place], [place] [address] Evaluation Performed: f/u persistent afib  PCP:  Lajean Manes, MD  Primary Cardiologist:   Dr. Marlou Porch  Primary Electrophysiologist: Dr. Rayann Heman   CC: persistent afib since last week    History of Present Illness: [name] is a 88 y.o. female who presents  for persistent afib since last week.  Pt with h/o of 2 prior ablation 2017 and most recent 09/2017 that has experienced persistent afib since last week. No known triggers. Her PCP recently increased cardizem for BP issues but she has not started. V rate 129 bpm today. She had several days of afib last May but converted on her own and DCCV was cancelled. Continues on eliquis with a CHA2DS2VASc score of 4.   F/u in afib clinic, 10/11/19, after successful cardioversion.  She remains in SR. She feels improved. Has noted mild ankle edema since increasing cardizem to 300 mg daily. Received her second covid shot this last week.   F/u in afib slinc 05/27/20. She went into persistent afib one week ago. No change in health,  although she does tell me she loss her husband in April. Her adult son with Parkinson's is living with her since being dx'ed and his wife asking him for a divorce. She has both vacccines and no missed anticoagulation.   F/u in afib clinic 07/08/20. She spontaneously reverted to SR so cardioversion was cancelled. She then had f/u with Dr. Marlou Porch  and was in afib. She was scheduled an appointment with Dr. Rayann Heman and he gave her several options but she wanted to revisit flecainide  and was started on 50 mg bid. She is now in the office and is in SR with flecainide 50 mg bid.. She has a first degree block on flecainide similar  to EKG's on flecainide in the past. She feels well. No afib since start of flecainide.   F/u in afib clinic, 07/01/21. She  remains in SR. No issues with afib. Continues on flecainide 50 mg bid. No issues with Eliquis 5 mg bid for a CHA2DS2VASc  score of 5.   F/u afib clinic, 08/27/21. Pt was seen by Dr. Rayann Heman 10/25 and was found to be in atypical atrial flutter for around 2 weeks. She was set up for cardioversion. Her loop monitor was explanted at that visit. Cardioversion 08/20/21 was successful. She was told if ERAF then he would consider another ablation.   F/u in afib clinic, 02/17/22. She reports that she is doing well, no afib to report.   Today, she denies symptoms of palpitations, chest pain, shortness of breath, orthopnea, PND, lower extremity edema, claudication, dizziness, presyncope, syncope, bleeding, or neurologic sequela. The patient is tolerating medications without difficulties and is otherwise without complaint today.   she denies symptoms of cough, fevers, chills, or new SOB worrisome for COVID 19.    she has a BMI of Body mass index is 30.25 kg/m.Marland Kitchen Filed Weights   02/17/22 1018  Weight: 82.5 kg    Past Medical History:  Diagnosis Date   Arthritis    hands,  back   Asthmatic bronchitis    Atrial fibrillation ([facility]) 07/31/2013   Chronic anticoagulation 07/30/2014   Chronic kidney disease    stage 3 renal disease - no med   Dizziness 07/30/2014   Dysrhythmia    Hx - a-fib 05/2010 - tx with meds, no problem since 05/2010   First degree AV block 07/30/2014   Hearing loss    bilateral hearing aids   Hypertension    controlled with meds   Hypothyroidism    Internal hemorrhoid    PAF (paroxysmal atrial fibrillation) ([facility]) 11/28/2015   Paroxysmal atrial fibrillation ([facility])    Peripheral vascular disease ([facility])    right arm and  right shoulder blood clots r/t a fall   Persistent atrial fibrillation ([facility]) 09/28/2017   Rectal bleeding    SVD (spontaneous vaginal delivery) 1957, 1959   x 2   Thyroid disease    hypothyroidism   Past Surgical History:  Procedure Laterality Date   ABDOMINAL HYSTERECTOMY     and right ovary   ANTERIOR AND POSTERIOR REPAIR N/A 01/03/2014   Procedure: ANTERIOR (CYSTOCELE) REPAIR;  Surgeon: Claiborne Billings A. Pamala Hurry, MD;  Location: [facility];  Service: Gynecology;  Laterality: N/A;   APPENDECTOMY     ATRIAL FIBRILLATION ABLATION N/A 09/28/2017   Procedure: ATRIAL FIBRILLATION ABLATION;  Surgeon: [name], MD;  Location: [facility];  Service: Cardiovascular;  Laterality: N/A;   BACK SURGERY     x 3 - 2 rods and 8 screws   CARDIOVERSION N/A 08/24/2017   Procedure: CARDIOVERSION;  Surgeon: Jerline Pain, MD;  Location: [facility];  Service: Cardiovascular;  Laterality: N/A;   CARDIOVERSION N/A 10/19/2017   Procedure: CARDIOVERSION;  Surgeon: Josue Hector, MD;  Location: [facility];  Service: Cardiovascular;  Laterality: N/A;   CARDIOVERSION N/A 10/04/2019   Procedure: CARDIOVERSION;  Surgeon: Pixie Casino, MD;  Location: [facility];  Service: Cardiovascular;  Laterality: N/A;   CARDIOVERSION N/A 08/20/2021   Procedure: CARDIOVERSION;  Surgeon: Jerline Pain, MD;  Location: [facility];  Service: Cardiovascular;  Laterality: N/A;   COLONOSCOPY     CYSTOCELE REPAIR     DENTAL SURGERY     infected tooth - general   DILATION AND CURETTAGE OF UTERUS     x several   ELECTROPHYSIOLOGIC STUDY N/A 11/28/2015   Procedure: Atrial Fibrillation Ablation;  Surgeon: [name], MD;  Location: [facility];  Service: Cardiovascular;  Laterality: N/A;   EP IMPLANTABLE DEVICE N/A 06/08/2016   Procedure: Loop Recorder Insertion;  Surgeon: [name], MD;  Location: [facility];  Service: Cardiovascular;  Laterality: N/A;   EXAMINATION UNDER ANESTHESIA N/A 01/15/2014    Procedure: EXAM UNDER ANESTHESIA with evacuation of hematoma and over sewing of vaginal mucosa.;  Surgeon: Floyce Stakes. Pamala Hurry, MD;  Location: [facility];  Service: Gynecology;  Laterality: N/A;   HAND SURGERY     left   hemorrhoid injection  04/2011   implantable loop recorder removal  08/06/2021   MDT Reveal LINQ removed by Dr Rayann Heman   JOINT REPLACEMENT     left thumb replacement   MYRINGOTOMY WITH TUBE PLACEMENT Right 10/27/2018   TEE WITHOUT CARDIOVERSION N/A 09/27/2017   Procedure: TRANSESOPHAGEAL ECHOCARDIOGRAM (TEE);  Surgeon: Sueanne Margarita, MD;  Location: [facility];  Service: Cardiovascular;  Laterality: N/A;   TONSILLECTOMY       Current Outpatient Medications  Medication Sig Dispense Refill   acetaminophen (TYLENOL) 500 MG tablet Take 500-1,000 mg by  mouth every 6 (six) hours as needed for moderate pain or headache.     apixaban (ELIQUIS) 5 MG TABS tablet TAKE 1 TABLET BY MOUTH TWICE A DAY 180 tablet 1   Biotin 2500 MCG CAPS Take 1 capsule by mouth daily with lunch.     Calcium Carb-Cholecalciferol (CALCIUM 600 + D PO) Take 1 tablet by mouth daily with lunch. 600 mg / 20 mcg     diclofenac Sodium (VOLTAREN) 1 % GEL as needed.     diltiazem (CARDIZEM CD) 300 MG 24 hr capsule Take 300 mg by mouth daily.     diltiazem (CARDIZEM) 30 MG tablet TAKE 1 TABLET BY MOUTH EVERY 4 HOURS AS NEEDED (FOR FAST HEART RATE > 100 AS LONG AS BP > 100). 135 tablet 1   famotidine (PEPCID) 10 MG tablet Take 10 mg by mouth 2 (two) times daily as needed for heartburn or indigestion.     flecainide (TAMBOCOR) 50 MG tablet TAKE 1 TABLET BY MOUTH TWICE A DAY 180 tablet 2   levothyroxine (SYNTHROID, LEVOTHROID) 75 MCG tablet Take 75 mcg by mouth daily before breakfast.     PARoxetine (PAXIL) 30 MG tablet Take 30 mg by mouth daily.     Probiotic Product (PROBIOTIC PO) Take 1 capsule by mouth daily with lunch.     psyllium (METAMUCIL) 58.6 % powder Take 1 packet by mouth 4 (four) times a week.      ramipril  (ALTACE) 10 MG capsule Take 10 mg by mouth 2 (two) times daily.      rosuvastatin (CRESTOR) 10 MG tablet TAKE 1 TABLET BY MOUTH EVERY DAY 90 tablet 1   vitamin B-12 (CYANOCOBALAMIN) 1000 MCG tablet Take 1,000 mcg by mouth daily with lunch.     No current facility-administered medications for this encounter.    Allergies:   Pneumovax 23 [pneumococcal vac polyvalent], Codeine, Cephalexin, Ciprofloxacin, Sudafed [pseudoephedrine hcl], and Sulfa antibiotics   Social History:  The patient  reports that she has never smoked. She has never used smokeless tobacco. She reports that she does not drink alcohol and does not use drugs.   Family History:  The patient's  family history includes Diabetes in her father; Heart attack in her mother and sister; Heart disease in her brother, mother, and sister; Heart failure in her brother; Hypertension in her father and sister; Stroke in her father; Sudden death in her maternal grandfather.    ROS:  Please see the history of present illness.   All other systems are personally reviewed and negative.   Exam: GEN- The patient is well appearing, alert and oriented x 3 today.   Head- normocephalic, atraumatic Eyes-  Sclera clear, conjunctiva pink Ears- hearing intact Oropharynx- clear Neck- supple, no JVP Lymph- no cervical lymphadenopathy Lungs- Clear to ausculation bilaterally, normal work of breathing Heart- regular rate and rhythm, no murmurs, rubs or gallops, PMI not laterally displaced GI- soft, NT, ND, + BS Extremities- no clubbing, cyanosis, or edema MS- no significant deformity or atrophy Skin- no rash or lesion Psych- euthymic mood, full affect Neuro- strength and sensation are intact    EKG: Vent. rate 67 BPM PR interval 256 ms QRS duration 92 ms QT/QTcB 412/435 ms P-R-T axes 78 -61 74 Sinus rhythm with 1st degree A-V block Left axis deviation Septal infarct , age undetermined Abnormal ECG   ASSESSMENT AND PLAN:  1.Persisitent  symptomatic atrial fibrillation  Maintaining SR  Continue flecainide 50 mg bid  Continue apixaban 5 mg bid  Continue Cardizem at  300 mg daily  This patients CHA2DS2-VASc Score and unadjusted Ischemic Stroke Rate (% per year) is equal to 4.8 % stroke rate/year from a score of 4  Above score calculated as 1 point each if present [CHF, HTN, DM, Vascular=MI/PAD/Aortic Plaque, Age if 65-74, or Female] Above score calculated as 2 points each if present [Age > 75, or Stroke/TIA/TE]   Follow-up:f/u in afib clinic in 6 months   Current medicines are reviewed at length with the patient today.   The patient does not have concerns regarding her medicines.  The following changes were made today:  none  Labs/ tests ordered today include: pre procedure cbc/bmet/ covid screen Orders Placed This Encounter  Procedures   EKG 12-Lead   F/u with Dr. Marlou Porch per schedule    Signed, Roderic Palau NP  02/17/2022 11:08 AM  [facility] [address] [place], [place] [address] [phone]

## 2022-02-26 ENCOUNTER — Other Ambulatory Visit: Payer: Self-pay | Admitting: Cardiology

## 2022-03-04 ENCOUNTER — Ambulatory Visit (HOSPITAL_COMMUNITY)
Admission: RE | Admit: 2022-03-04 | Discharge: 2022-03-04 | Disposition: A | Discharge status: Home / Self Care | Payer: Medicare Other | Source: Ambulatory Visit | Attending: Nurse Practitioner | Admitting: Nurse Practitioner

## 2022-03-04 VITALS — BP 134/92 | HR 110 | Ht 65.0 in | Wt 180.6 lb

## 2022-03-04 DIAGNOSIS — I480 Paroxysmal atrial fibrillation: Secondary | ICD-10-CM | POA: Diagnosis not present

## 2022-03-04 DIAGNOSIS — I4819 Other persistent atrial fibrillation: Secondary | ICD-10-CM

## 2022-03-04 DIAGNOSIS — Z79899 Other long term (current) drug therapy: Secondary | ICD-10-CM | POA: Diagnosis not present

## 2022-03-04 DIAGNOSIS — D6869 Other thrombophilia: Secondary | ICD-10-CM

## 2022-03-04 LAB — CBC
HCT: 43.3 % (ref 36.0–46.0)
Hemoglobin: 14.2 g/dL (ref 12.0–15.0)
MCH: 29 pg (ref 26.0–34.0)
MCHC: 32.8 g/dL (ref 30.0–36.0)
MCV: 88.5 fL (ref 80.0–100.0)
Platelets: 248 10*3/uL (ref 150–400)
RBC: 4.89 MIL/uL (ref 3.87–5.11)
RDW: 13.8 % (ref 11.5–15.5)
WBC: 7.8 10*3/uL (ref 4.0–10.5)
nRBC: 0 % (ref 0.0–0.2)

## 2022-03-04 LAB — BASIC METABOLIC PANEL
Anion gap: 9 (ref 5–15)
BUN: 16 mg/dL (ref 8–23)
CO2: 26 mmol/L (ref 22–32)
Calcium: 10.2 mg/dL (ref 8.9–10.3)
Chloride: 104 mmol/L (ref 98–111)
Creatinine, Ser: 1.17 mg/dL — ABNORMAL HIGH (ref 0.44–1.00)
GFR, Estimated: 45 mL/min — ABNORMAL LOW (ref >60)
Glucose, Bld: 122 mg/dL — ABNORMAL HIGH (ref 70–99)
Potassium: 4.3 mmol/L (ref 3.5–5.1)
Sodium: 139 mmol/L (ref 135–145)

## 2022-03-04 NOTE — Patient Instructions (Signed)
Cardioversion scheduled for July. 11th  2023  - Arrive at the [facility] and go to admitting at 10:30AM   - Do not eat or drink anything after midnight the night prior to your procedure.  - Take all your morning medication (except diabetic medications) with a sip of water prior to arrival.  - You will not be able to drive home after your procedure.  - Do NOT miss any doses of your blood thinner - if you should miss a dose please notify our office immediately.  - If you feel as if you go back into normal rhythm prior to scheduled cardioversion, please notify our office immediately. If your procedure is canceled in the cardioversion suite you will be charged a cancellation fee.

## 2022-03-04 NOTE — Addendum Note (Signed)
Encounter addended by: Juluis Mire, RN on: 03/04/2022 4:14 PM  Actions taken: Visit diagnoses modified, Pharmacy for encounter modified, Order list changed, Diagnosis association updated

## 2022-03-04 NOTE — Progress Notes (Signed)
Due to national recommendations of social distancing due to [place] 19, Audio/video telehealth visit is felt to be most appropriate for this patient at this time.  See MyChart message/consent below  from today for patient consent regarding telehealth for the At[facility] Fibrillation Clinic.    Date:  03/04/2022   ID:  Frances [name], DOB [birth year], MRN [id number]  Location: Office  Provider location: [place], [place] [address] Evaluation Performed: f/u persistent afib  PCP:  Lajean Manes, MD  Primary Cardiologist:   Dr. Marlou Porch  Primary Electrophysiologist: Dr. Rayann Heman   CC: persistent afib since last week    History of Present Illness: Frances [name] is a 88 y.o. female who presents  for persistent afib since last week.  Pt with h/o of 2 prior ablation 2017 and most recent 09/2017 that has experienced persistent afib since last week. No known triggers. Her PCP recently increased cardizem for BP issues but she has not started. V rate 129 bpm today. She had several days of afib last May but converted on her own and DCCV was cancelled. Continues on eliquis with a CHA2DS2VASc score of 4.   F/u in afib clinic, 10/11/19, after successful cardioversion.  She remains in SR. She feels improved. Has noted mild ankle edema since increasing cardizem to 300 mg daily. Received her second covid shot this last week.   F/u in afib slinc 05/27/20. She went into persistent afib one week ago. No change in health,  although she does tell me she loss her husband in April. Her adult son with Parkinson's is living with her since being dx'ed and his wife asking him for a divorce. She has both vacccines and no missed anticoagulation.   F/u in afib clinic 07/08/20. She spontaneously reverted to SR so cardioversion was cancelled. She then had f/u with Dr. Marlou Porch  and was in afib. She was scheduled an appointment with Dr. Rayann Heman and he gave her several options but she wanted to revisit flecainide  and was started on 50 mg bid. She is now in the office and is in SR with flecainide 50 mg bid.. She has a first degree block on flecainide similar  to EKG's on flecainide in the past. She feels well. No afib since start of flecainide.   F/u in afib clinic, 07/01/21. She  remains in SR. No issues with afib. Continues on flecainide 50 mg bid. No issues with Eliquis 5 mg bid for a CHA2DS2VASc  score of 5.   F/u afib clinic, 08/27/21. Pt was seen by Dr. Rayann Heman 10/25 and was found to be in atypical atrial flutter for around 2 weeks. She was set up for cardioversion. Her loop monitor was explanted at that visit. Cardioversion 08/20/21 was successful. She was told if ERAF then he would consider another ablation.   F/u in afib clinic, 02/17/22. She reports that she is doing well, no afib to report.   F/u in afib clinic 03/04/22 as she felt she went into afib a few days earlier. EKG today shows atrial flutter  at 110 bpm. Out of rhythm since last Tuesday. No know trigger.   Today, she denies symptoms of palpitations, chest pain, shortness of breath, orthopnea, PND, lower extremity edema, claudication, dizziness, presyncope, syncope, bleeding, or neurologic sequela. The patient is tolerating medications without difficulties and is otherwise without complaint today.   she denies symptoms of cough, fevers, chills, or new SOB worrisome for COVID 19.    she  has a BMI of There is no height or weight on file to calculate BMI.. There were no vitals filed for this visit.   Past Medical History:  Diagnosis Date   Arthritis    hands, back   Asthmatic bronchitis    Atrial fibrillation ([place]) 07/31/2013   Chronic anticoagulation 07/30/2014   Chronic kidney disease    stage 3 renal disease - no med   Dizziness 07/30/2014   Dysrhythmia    Hx - a-fib 05/2010 - tx with meds, no problem since 05/2010   First degree AV block 07/30/2014   Hearing loss    bilateral hearing aids   Hypertension    controlled with meds    Hypothyroidism    Internal hemorrhoid    PAF (paroxysmal atrial fibrillation) ([place]) 11/28/2015   Paroxysmal atrial fibrillation ([facility])    Peripheral vascular disease ([place])    right arm and right shoulder blood clots r/t a fall   Persistent atrial fibrillation ([place]) 09/28/2017   Rectal bleeding    SVD (spontaneous vaginal delivery) 1957, 1959   x 2   Thyroid disease    hypothyroidism   Past Surgical History:  Procedure Laterality Date   ABDOMINAL HYSTERECTOMY     and right ovary   ANTERIOR AND POSTERIOR REPAIR N/A 01/03/2014   Procedure: ANTERIOR (CYSTOCELE) REPAIR;  Surgeon: Claiborne Billings A. Pamala Hurry, MD;  Location: [facility];  Service: Gynecology;  Laterality: N/A;   APPENDECTOMY     ATRIAL FIBRILLATION ABLATION N/A 09/28/2017   Procedure: ATRIAL FIBRILLATION ABLATION;  Surgeon: [name], MD;  Location: [facility];  Service: Cardiovascular;  Laterality: N/A;   BACK SURGERY     x 3 - 2 rods and 8 screws   CARDIOVERSION N/A 08/24/2017   Procedure: CARDIOVERSION;  Surgeon: Jerline Pain, MD;  Location: [facility];  Service: Cardiovascular;  Laterality: N/A;   CARDIOVERSION N/A 10/19/2017   Procedure: CARDIOVERSION;  Surgeon: Josue Hector, MD;  [facility];  Service: Cardiovascular;  Laterality: N/A;   CARDIOVERSION N/A 10/04/2019   Procedure: CARDIOVERSION;  Surgeon: Pixie Casino, MD;  Location: [facility];  Service: Cardiovascular;  Laterality: N/A;   CARDIOVERSION N/A 08/20/2021   Procedure: CARDIOVERSION;  Surgeon: Jerline Pain, MD;  Location: [facility];  Service: Cardiovascular;  Laterality: N/A;   COLONOSCOPY     CYSTOCELE REPAIR     DENTAL SURGERY     infected tooth - general   DILATION AND CURETTAGE OF UTERUS     x several   ELECTROPHYSIOLOGIC STUDY N/A 11/28/2015   Procedure: Atrial Fibrillation Ablation;  Surgeon: [name], MD;  Location: [facility];  Service: Cardiovascular;  Laterality: N/A;   EP IMPLANTABLE DEVICE N/A  06/08/2016   Procedure: Loop Recorder Insertion;  Surgeon: [name], MD;  Location: [facility];  Service: Cardiovascular;  Laterality: N/A;   EXAMINATION UNDER ANESTHESIA N/A 01/15/2014   Procedure: EXAM UNDER ANESTHESIA with evacuation of hematoma and over sewing of vaginal mucosa.;  Surgeon: Floyce Stakes. Pamala Hurry, MD;  Location: [facility];  Service: Gynecology;  Laterality: N/A;   HAND SURGERY     left   hemorrhoid injection  04/2011   implantable loop recorder removal  08/06/2021   MDT Reveal LINQ removed by Dr Rayann Heman   JOINT REPLACEMENT     left thumb replacement   MYRINGOTOMY WITH TUBE PLACEMENT Right 10/27/2018   TEE WITHOUT CARDIOVERSION N/A 09/27/2017   Procedure: TRANSESOPHAGEAL ECHOCARDIOGRAM (TEE);  Surgeon: Sueanne Margarita, MD;  Location: [facility];  Service: Cardiovascular;  Laterality: N/A;   TONSILLECTOMY       Current Outpatient Medications  Medication Sig Dispense Refill   acetaminophen (TYLENOL) 500 MG tablet Take 500-1,000 mg by mouth every 6 (six) hours as needed for moderate pain or headache.     apixaban (ELIQUIS) 5 MG TABS tablet TAKE 1 TABLET BY MOUTH TWICE A DAY 180 tablet 1   Biotin 2500 MCG CAPS Take 1 capsule by mouth daily with lunch.     Calcium Carb-Cholecalciferol (CALCIUM 600 + D PO) Take 1 tablet by mouth daily with lunch. 600 mg / 20 mcg     diclofenac Sodium (VOLTAREN) 1 % GEL as needed.     diltiazem (CARDIZEM CD) 300 MG 24 hr capsule Take 300 mg by mouth daily.     diltiazem (CARDIZEM) 30 MG tablet TAKE 1 TABLET BY MOUTH EVERY 4 HOURS AS NEEDED (FOR FAST HEART RATE > 100 AS LONG AS BP > 100). 135 tablet 1   famotidine (PEPCID) 10 MG tablet Take 10 mg by mouth 2 (two) times daily as needed for heartburn or indigestion.     flecainide (TAMBOCOR) 50 MG tablet TAKE 1 TABLET BY MOUTH TWICE A DAY 180 tablet 2   levothyroxine (SYNTHROID, LEVOTHROID) 75 MCG tablet Take 75 mcg by mouth daily before breakfast.     PARoxetine (PAXIL) 30 MG tablet Take  30 mg by mouth daily.     Probiotic Product (PROBIOTIC PO) Take 1 capsule by mouth daily with lunch.     psyllium (METAMUCIL) 58.6 % powder Take 1 packet by mouth 4 (four) times a week.      ramipril (ALTACE) 10 MG capsule Take 10 mg by mouth 2 (two) times daily.      rosuvastatin (CRESTOR) 10 MG tablet Take 1 tablet (10 mg total) by mouth daily. Please call [phone] to schedule an appointment for future refills. Thank you. 30 tablet 0   vitamin B-12 (CYANOCOBALAMIN) 1000 MCG tablet Take 1,000 mcg by mouth daily with lunch.     No current facility-administered medications for this encounter.    Allergies:   Pneumovax 23 [pneumococcal vac polyvalent], Codeine, Cephalexin, Ciprofloxacin, Sudafed [pseudoephedrine hcl], and Sulfa antibiotics   Social History:  The patient  reports that she has never smoked. She has never used smokeless tobacco. She reports that she does not drink alcohol and does not use drugs.   Family History:  The patient's  family history includes Diabetes in her father; Heart attack in her mother and sister; Heart disease in her brother, mother, and sister; Heart failure in her brother; Hypertension in her father and sister; Stroke in her father; Sudden death in her maternal grandfather.    ROS:  Please see the history of present illness.   All other systems are personally reviewed and negative.   Exam: GEN- The patient is well appearing, alert and oriented x 3 today.   Head- normocephalic, atraumatic Eyes-  Sclera clear, conjunctiva pink Ears- hearing intact Oropharynx- clear Neck- supple, no JVP Lymph- no cervical lymphadenopathy Lungs- Clear to ausculation bilaterally, normal work of breathing Heart- regular rate and rhythm, no murmurs, rubs or gallops, PMI not laterally displaced GI- soft, NT, ND, + BS Extremities- no clubbing, cyanosis, or edema MS- no significant deformity or atrophy Skin- no rash or lesion Psych- euthymic mood, full affect Neuro-  strength and sensation are intact    EKG: Vent. rate 67 BPM PR interval 256 ms QRS duration 92 ms  QT/QTcB 412/435 ms P-R-T axes 78 -61 74 Sinus rhythm with 1st degree A-V block Left axis deviation Septal infarct , age undetermined Abnormal ECG   ASSESSMENT AND PLAN:  1.Persisitent symptomatic atrial fibrillation  Maintaining SR  until the last week, with now atypical atrial flutter, will schedule for cardioversion, pt has had CV's in the past, risk vrs benefit discussed Continue flecainide 50 mg bid  Continue apixaban 5 mg bid, no missed doses for at least 3 weeks  Continue Cardizem at  300 mg daily Cbc/bmet   This patients CHA2DS2-VASc Score and unadjusted Ischemic Stroke Rate (% per year) is equal to 4.8 % stroke rate/year from a score of 4  Above score calculated as 1 point each if present [CHF, HTN, DM, Vascular=MI/PAD/Aortic Plaque, Age if 65-74, or Female] Above score calculated as 2 points each if present [Age > 75, or Stroke/TIA/TE]   Follow-up:f/u in afib clinic one week after cardioversion   Current medicines are reviewed at length with the patient today.   The patient does not have concerns regarding her medicines.  The following changes were made today:  none  Labs/ tests ordered today include: pre procedure cbc/bmet/ covid screen No orders of the defined types were placed in this encounter.  F/u with Dr. Marlou Porch per schedule    Signed, Roderic Palau NP  03/04/2022 3:17 PM  [facility] [address] [place], [place] [address] [phone]

## 2022-03-05 ENCOUNTER — Encounter (HOSPITAL_COMMUNITY): Payer: Self-pay | Admitting: Cardiology

## 2022-03-11 DIAGNOSIS — Z9622 Myringotomy tube(s) status: Secondary | ICD-10-CM | POA: Diagnosis not present

## 2022-03-11 DIAGNOSIS — H6521 Chronic serous otitis media, right ear: Secondary | ICD-10-CM | POA: Diagnosis not present

## 2022-03-16 ENCOUNTER — Telehealth (HOSPITAL_COMMUNITY): Payer: Self-pay

## 2022-03-16 ENCOUNTER — Ambulatory Visit (HOSPITAL_COMMUNITY)
Admission: RE | Admit: 2022-03-16 | Discharge: 2022-03-16 | Disposition: A | Discharge status: Home / Self Care | Payer: Medicare Other | Source: Ambulatory Visit | Attending: Physician Assistant | Admitting: Physician Assistant

## 2022-03-16 VITALS — HR 71

## 2022-03-16 DIAGNOSIS — I4819 Other persistent atrial fibrillation: Secondary | ICD-10-CM | POA: Diagnosis not present

## 2022-03-16 DIAGNOSIS — I440 Atrioventricular block, first degree: Secondary | ICD-10-CM | POA: Diagnosis not present

## 2022-03-16 NOTE — Telephone Encounter (Signed)
Patient called regarding her A-fib. She feels like she is back in NSR. Patient came in for appointment on 7/10 @ 3:45pm to confirm if she was in A-flutter verses NSR. EKG determined she is back in NSR. Cancelled cardioversion and scheduled her for a 3 month follow up on 10/25 @ 10:00am with Roderic Palau NP.

## 2022-03-16 NOTE — Progress Notes (Signed)
Patient returns for ECG, felt she was back in SR. ECG shows SR HR 71, 1st degree AV block, PR 246, QRS 90, QTc 452. DCCV cancelled. F/u with Roderic Palau in 3 months.

## 2022-03-17 ENCOUNTER — Ambulatory Visit (HOSPITAL_COMMUNITY): Admission: RE | Admit: 2022-03-17 | Payer: Medicare Other | Source: Ambulatory Visit | Admitting: Cardiology

## 2022-03-17 SURGERY — CARDIOVERSION
Anesthesia: General

## 2022-03-19 ENCOUNTER — Other Ambulatory Visit: Payer: Self-pay | Admitting: Internal Medicine

## 2022-03-23 ENCOUNTER — Other Ambulatory Visit: Payer: Self-pay | Admitting: Cardiology

## 2022-03-24 ENCOUNTER — Ambulatory Visit (HOSPITAL_COMMUNITY): Payer: Medicare Other | Admitting: Nurse Practitioner

## 2022-04-03 DIAGNOSIS — M1711 Unilateral primary osteoarthritis, right knee: Secondary | ICD-10-CM | POA: Diagnosis not present

## 2022-04-24 DIAGNOSIS — I129 Hypertensive chronic kidney disease with stage 1 through stage 4 chronic kidney disease, or unspecified chronic kidney disease: Secondary | ICD-10-CM | POA: Diagnosis not present

## 2022-04-24 DIAGNOSIS — I480 Paroxysmal atrial fibrillation: Secondary | ICD-10-CM | POA: Diagnosis not present

## 2022-04-24 DIAGNOSIS — N1831 Chronic kidney disease, stage 3a: Secondary | ICD-10-CM | POA: Diagnosis not present

## 2022-04-26 DIAGNOSIS — R300 Dysuria: Secondary | ICD-10-CM | POA: Diagnosis not present

## 2022-04-26 DIAGNOSIS — N3001 Acute cystitis with hematuria: Secondary | ICD-10-CM | POA: Diagnosis not present

## 2022-05-06 DIAGNOSIS — Z23 Encounter for immunization: Secondary | ICD-10-CM | POA: Diagnosis not present

## 2022-05-19 DIAGNOSIS — Z03818 Encounter for observation for suspected exposure to other biological agents ruled out: Secondary | ICD-10-CM | POA: Diagnosis not present

## 2022-05-19 DIAGNOSIS — R490 Dysphonia: Secondary | ICD-10-CM | POA: Diagnosis not present

## 2022-05-19 DIAGNOSIS — R42 Dizziness and giddiness: Secondary | ICD-10-CM | POA: Diagnosis not present

## 2022-05-28 IMAGING — MG DIGITAL SCREENING BILAT W/ TOMO W/ CAD
8 series · 8 of 24 positions shown · non-contrast
Comparison: Previous exam(s).

CLINICAL DATA: Screening.

EXAM:
DIGITAL SCREENING BILATERAL MAMMOGRAM WITH TOMO AND CAD

[R MLO synth-2D]
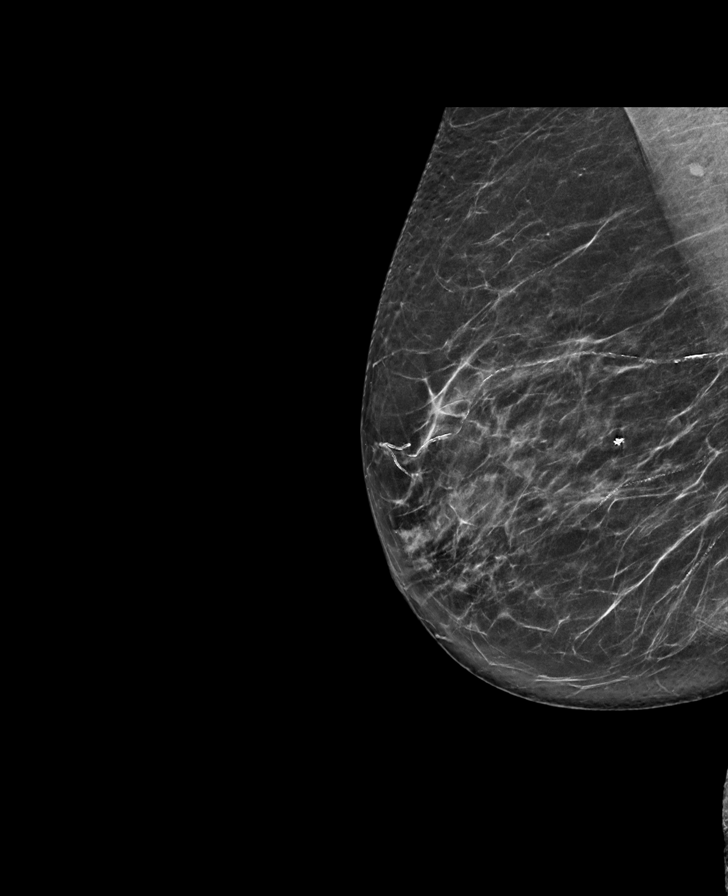

[L CC synth-2D]
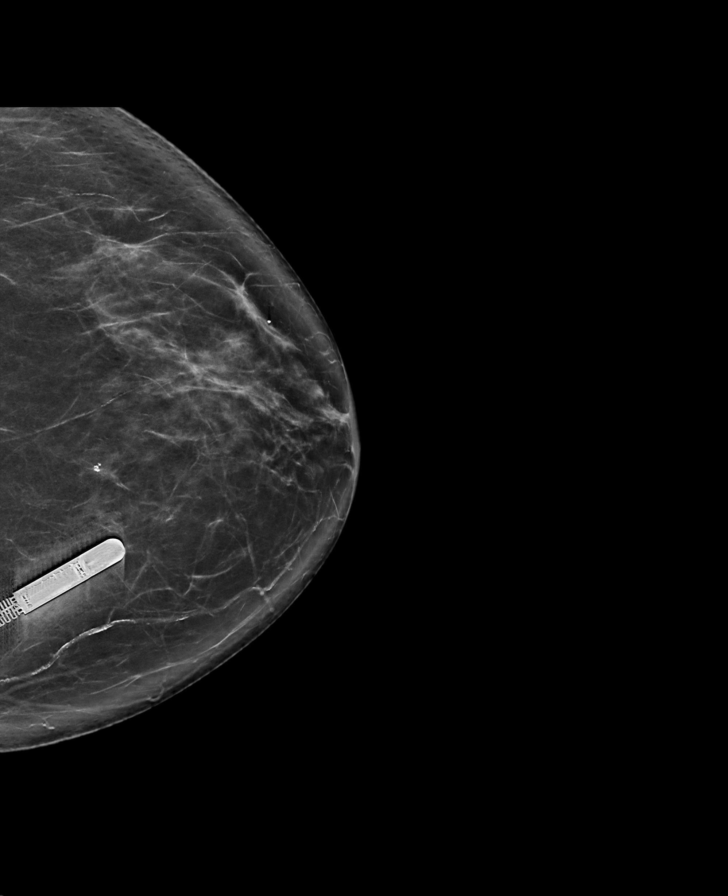

[L MLO synth-2D]
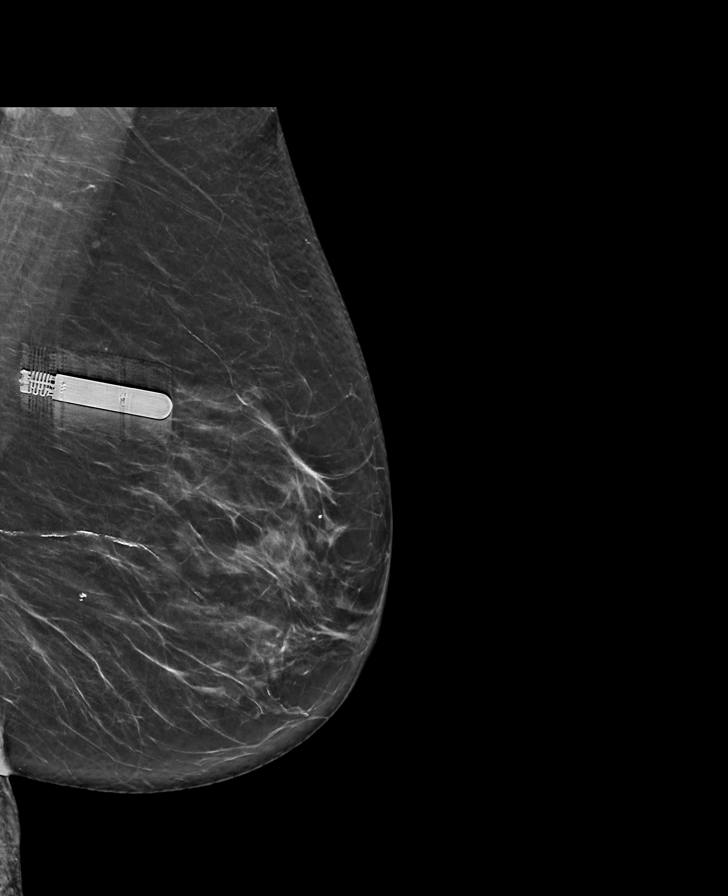

[R CC synth-2D]
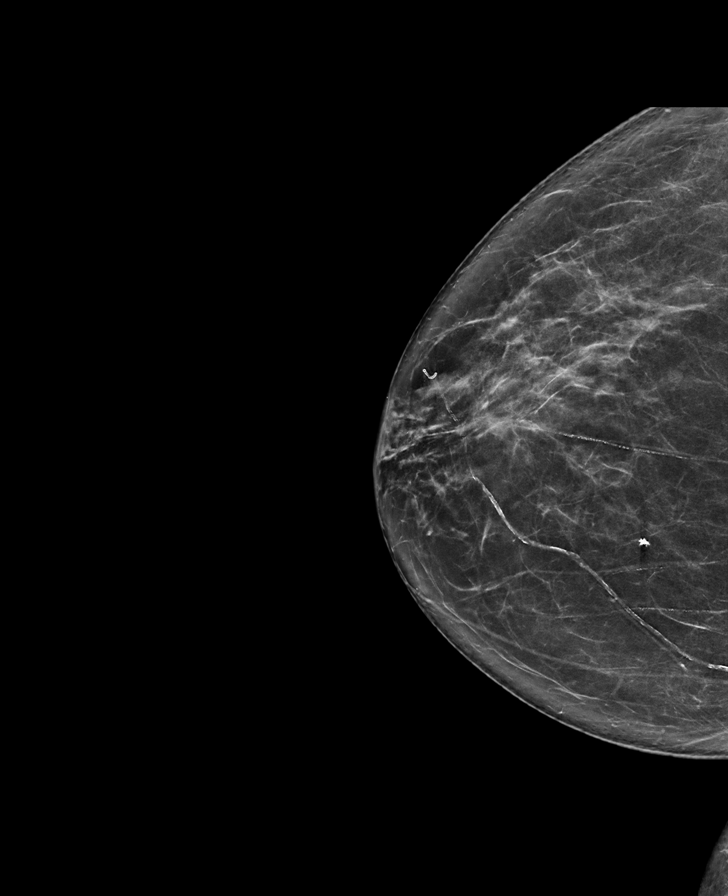

[L CC tomo · tomo slice 39/78.0]
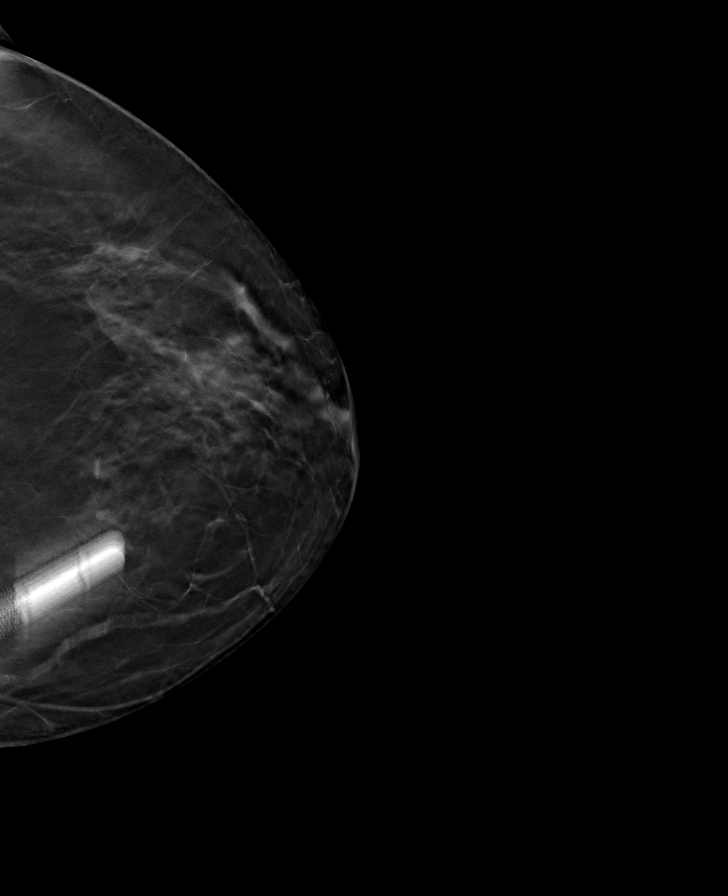

[L MLO tomo · tomo slice 38/75.0]
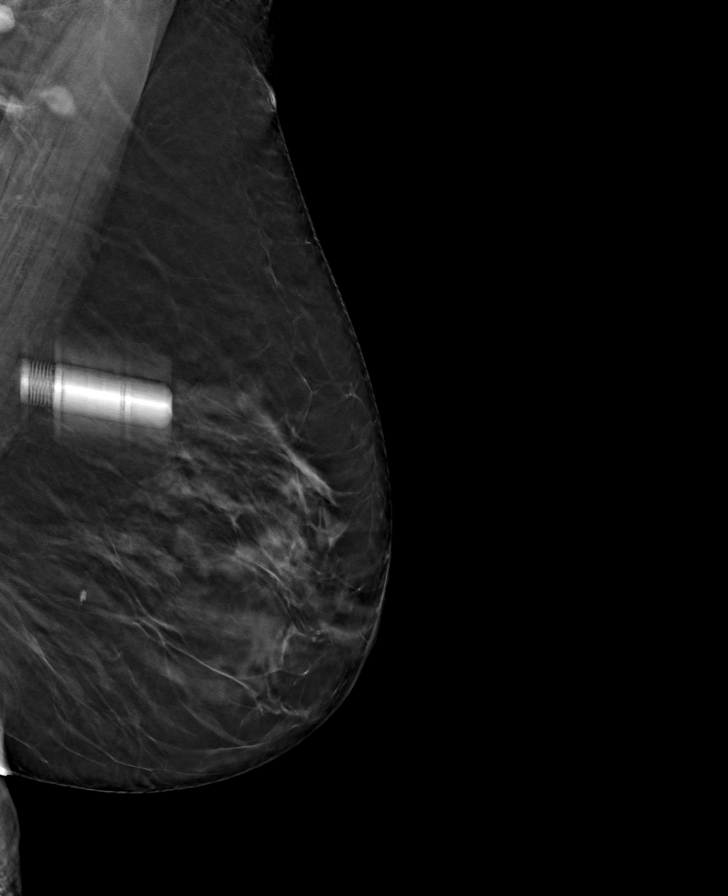

[R CC tomo · tomo slice 39/76.0]
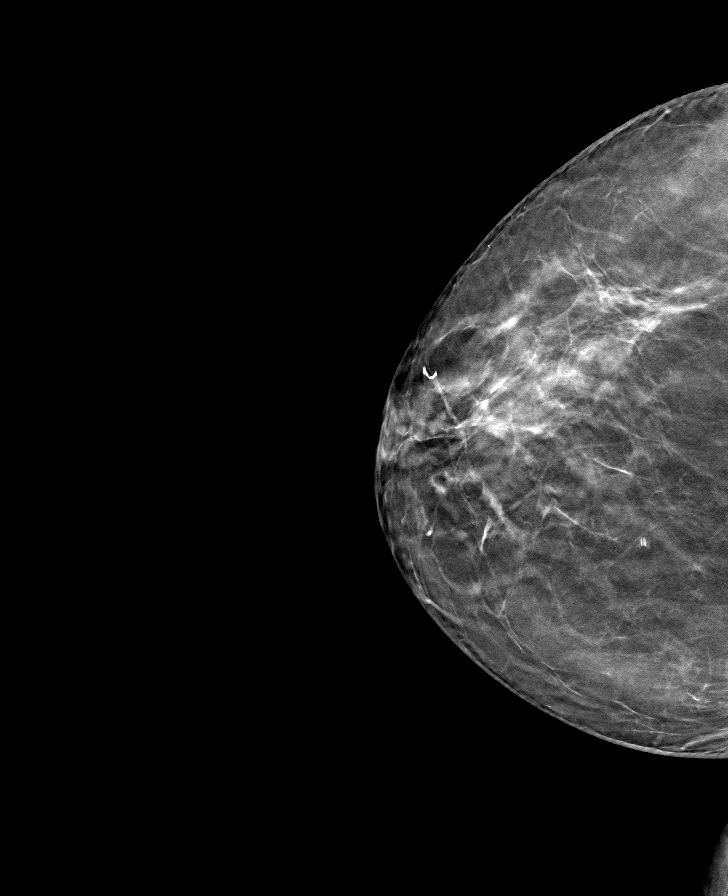

[R MLO tomo · tomo slice 37/73.0]
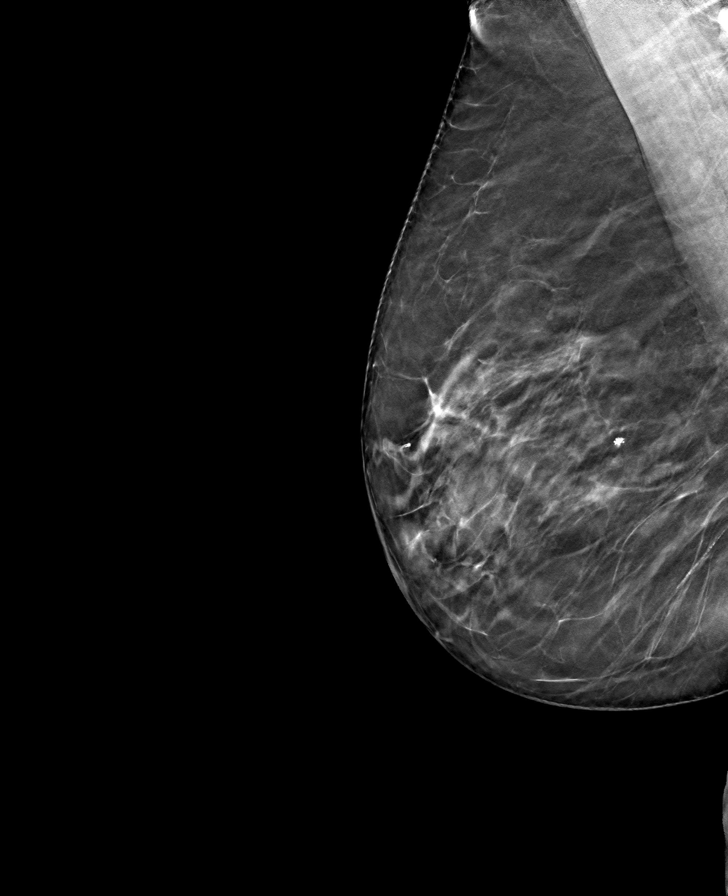

[8 of 24 positions shown; findings below may reference images not displayed]

ACR Breast Density Category b: There are scattered areas of
fibroglandular density.
FINDINGS: There are no findings suspicious for malignancy. Images were
processed with CAD.
IMPRESSION: No mammographic evidence of malignancy. A result letter of this
screening mammogram will be mailed directly to the patient.

RECOMMENDATION:
Screening mammogram in one year. (Code:[id number])

BI-RADS CATEGORY  1: Negative.

## 2022-06-01 DIAGNOSIS — I129 Hypertensive chronic kidney disease with stage 1 through stage 4 chronic kidney disease, or unspecified chronic kidney disease: Secondary | ICD-10-CM | POA: Diagnosis not present

## 2022-06-01 DIAGNOSIS — N1831 Chronic kidney disease, stage 3a: Secondary | ICD-10-CM | POA: Diagnosis not present

## 2022-06-01 DIAGNOSIS — I700 Atherosclerosis of aorta: Secondary | ICD-10-CM | POA: Diagnosis not present

## 2022-06-01 DIAGNOSIS — R490 Dysphonia: Secondary | ICD-10-CM | POA: Diagnosis not present

## 2022-06-19 DIAGNOSIS — R399 Unspecified symptoms and signs involving the genitourinary system: Secondary | ICD-10-CM | POA: Diagnosis not present

## 2022-06-23 ENCOUNTER — Ambulatory Visit (HOSPITAL_COMMUNITY)
Admission: RE | Admit: 2022-06-23 | Discharge: 2022-06-23 | Disposition: A | Discharge status: Home / Self Care | Payer: Medicare Other | Source: Ambulatory Visit | Attending: Nurse Practitioner | Admitting: Nurse Practitioner

## 2022-06-23 ENCOUNTER — Encounter (HOSPITAL_COMMUNITY): Payer: Self-pay | Admitting: Nurse Practitioner

## 2022-06-23 VITALS — BP 130/70 | HR 106 | Ht 65.0 in | Wt 183.0 lb

## 2022-06-23 DIAGNOSIS — I4892 Unspecified atrial flutter: Secondary | ICD-10-CM | POA: Insufficient documentation

## 2022-06-23 DIAGNOSIS — Z7901 Long term (current) use of anticoagulants: Secondary | ICD-10-CM | POA: Insufficient documentation

## 2022-06-23 DIAGNOSIS — D6869 Other thrombophilia: Secondary | ICD-10-CM | POA: Diagnosis not present

## 2022-06-23 DIAGNOSIS — I4819 Other persistent atrial fibrillation: Secondary | ICD-10-CM | POA: Diagnosis not present

## 2022-06-23 LAB — BASIC METABOLIC PANEL
Anion gap: 10 (ref 5–15)
BUN: 16 mg/dL (ref 8–23)
CO2: 25 mmol/L (ref 22–32)
Calcium: 9.7 mg/dL (ref 8.9–10.3)
Chloride: 104 mmol/L (ref 98–111)
Creatinine, Ser: 1.1 mg/dL — ABNORMAL HIGH (ref 0.44–1.00)
GFR, Estimated: 49 mL/min — ABNORMAL LOW (ref >60)
Glucose, Bld: 132 mg/dL — ABNORMAL HIGH (ref 70–99)
Potassium: 3.5 mmol/L (ref 3.5–5.1)
Sodium: 139 mmol/L (ref 135–145)

## 2022-06-23 LAB — CBC
HCT: 43 % (ref 36.0–46.0)
Hemoglobin: 13.9 g/dL (ref 12.0–15.0)
MCH: 28.3 pg (ref 26.0–34.0)
MCHC: 32.3 g/dL (ref 30.0–36.0)
MCV: 87.4 fL (ref 80.0–100.0)
Platelets: 243 10*3/uL (ref 150–400)
RBC: 4.92 MIL/uL (ref 3.87–5.11)
RDW: 14 % (ref 11.5–15.5)
WBC: 6.8 10*3/uL (ref 4.0–10.5)
nRBC: 0 % (ref 0.0–0.2)

## 2022-06-23 LAB — MAGNESIUM: Magnesium: 2.4 mg/dL (ref 1.7–2.4)

## 2022-06-23 NOTE — Patient Instructions (Signed)
Cardioversion scheduled for Thursday, October 26th  - Arrive at the [facility] and go to admitting at 1030am  - Do not eat or drink anything after midnight the night prior to your procedure.  - Take all your morning medication (except diabetic medications) with a sip of water prior to arrival.  - You will not be able to drive home after your procedure.  - Do NOT miss any doses of your blood thinner - if you should miss a dose please notify our office immediately.  - If you feel as if you go back into normal rhythm prior to scheduled cardioversion, please notify our office immediately. If your procedure is canceled in the cardioversion suite you will be charged a cancellation fee.

## 2022-06-23 NOTE — Progress Notes (Signed)
Due to national recommendations of social distancing due to [place] 19, Audio/video telehealth visit is felt to be most appropriate for this patient at this time.  See MyChart message/consent below  from today for patient consent regarding telehealth for the [facility].    Date:  06/23/2022   ID:  Frances [name], DOB 1934-01-18, MRN [id number]  Location: Office  Provider location: [place], [place] [address] Evaluation Performed: f/u persistent afib  PCP:  Lajean Manes, MD  Primary Cardiologist:   Dr. Marlou Porch  Primary Electrophysiologist: Dr. Rayann Heman   CC: persistent afib since last week    History of Present Illness: Frances [name] is a 88 y.o. female who presents  for persistent afib since last week.  Pt with h/o of 2 prior ablation 2017 and most recent 09/2017 that has experienced persistent afib since last week. No known triggers. Her PCP recently increased cardizem for BP issues but she has not started. V rate 129 bpm today. She had several days of afib last May but converted on her own and DCCV was cancelled. Continues on eliquis with a CHA2DS2VASc score of 4.   F/u in afib clinic, 10/11/19, after successful cardioversion.  She remains in SR. She feels improved. Has noted mild ankle edema since increasing cardizem to 300 mg daily. Received her second covid shot this last week.   F/u in afib [facility] 05/27/20. She went into persistent afib one week ago. No change in health,  although she does tell me she loss her husband in April. Her adult son with Parkinson's is living with her since being dx'ed and his wife asking him for a divorce. She has both vacccines and no missed anticoagulation.   F/u in afib clinic 07/08/20. She spontaneously reverted to SR so cardioversion was cancelled. She then had f/u with Dr. Marlou Porch  and was in afib. She was scheduled an appointment with Dr. Rayann Heman and he gave her several options but she wanted to revisit flecainide  and was started on 50 mg bid. She is now in the office and is in SR with flecainide 50 mg bid.. She has a first degree block on flecainide similar  to EKG's on flecainide in the past. She feels well. No afib since start of flecainide.   F/u in afib clinic, 07/01/21. She  remains in SR. No issues with afib. Continues on flecainide 50 mg bid. No issues with Eliquis 5 mg bid for a CHA2DS2VASc  score of 5.   F/u afib clinic, 08/27/21. Pt was seen by Dr. Rayann Heman 10/25 and was found to be in atypical atrial flutter for around 2 weeks. She was set up for cardioversion. Her loop monitor was explanted at that visit. Cardioversion 08/20/21 was successful. She was told if ERAF then he would consider another ablation.   F/u in afib clinic, 02/17/22. She reports that she is doing well, no afib to report.   F/u in afib clinic 03/04/22 as she felt she went into afib a few days earlier. EKG today shows atrial flutter  at 110 bpm. Out of rhythm since last Tuesday. No know trigger.   Pt was seen back for  EKG prior to cardioversion early July as she spontaneously converted, DCCV cancelled.   She is now in the afib clinic, 10/23. She has been out of rhythm x 2 weeks. Despite one day of extra flecainide and taking 30 mg Cardizem as needed. She is in atypical atrial flutter  today with v rates around 100 bpm. We discussed change of antiarrythmic and would like  to  discuss elective admit for Tikosyn after she is cardioverted back to SR. She would prefer not to  use amiodarone. No missed anticoagulation for the last 21 days.   Today, she denies symptoms of palpitations, chest pain, shortness of breath, orthopnea, PND, lower extremity edema, claudication, dizziness, presyncope, syncope, bleeding, or neurologic sequela. The patient is tolerating medications without difficulties and is otherwise without complaint today.   she denies symptoms of cough, fevers, chills, or new SOB worrisome for COVID 19.    she has a BMI of Body  mass index is 30.45 kg/m.Marland Kitchen Filed Weights   06/23/22 1351  Weight: 83 kg     Past Medical History:  Diagnosis Date   Arthritis    hands, back   Asthmatic bronchitis    Atrial fibrillation (HCC) 07/31/2013   Chronic anticoagulation 07/30/2014   Chronic kidney disease    stage 3 renal disease - no med   Dizziness 07/30/2014   Dysrhythmia    Hx - a-fib 05/2010 - tx with meds, no problem since 05/2010   First degree AV block 07/30/2014   Hearing loss    bilateral hearing aids   Hypertension    controlled with meds   Hypothyroidism    Internal hemorrhoid    PAF (paroxysmal atrial fibrillation) ([place]) 11/28/2015   Paroxysmal atrial fibrillation (HCC)    Peripheral vascular disease ([place])    right arm and right shoulder blood clots r/t a fall   Persistent atrial fibrillation ([place]) 09/28/2017   Rectal bleeding    SVD (spontaneous vaginal delivery) 1957, 1959   x 2   Thyroid disease    hypothyroidism   Past Surgical History:  Procedure Laterality Date   ABDOMINAL HYSTERECTOMY     and right ovary   ANTERIOR AND POSTERIOR REPAIR N/A 01/03/2014   Procedure: ANTERIOR (CYSTOCELE) REPAIR;  Surgeon: Claiborne Billings A. Pamala Hurry, MD;  Location: [facility];  Service: Gynecology;  Laterality: N/A;   APPENDECTOMY     ATRIAL FIBRILLATION ABLATION N/A 09/28/2017   Procedure: ATRIAL FIBRILLATION ABLATION;  Surgeon: [name], MD;  Location: [facility];  Service: Cardiovascular;  Laterality: N/A;   BACK SURGERY     x 3 - 2 rods and 8 screws   CARDIOVERSION N/A 08/24/2017   Procedure: CARDIOVERSION;  Surgeon: Jerline Pain, MD;  Location: [facility];  Service: Cardiovascular;  Laterality: N/A;   CARDIOVERSION N/A 10/19/2017   Procedure: CARDIOVERSION;  Surgeon: Josue Hector, MD;  Location: [facility];  Service: Cardiovascular;  Laterality: N/A;   CARDIOVERSION N/A 10/04/2019   Procedure: CARDIOVERSION;  Surgeon: Pixie Casino, MD;  Location: [facility] [facility];  Service: Cardiovascular;   Laterality: N/A;   CARDIOVERSION N/A 08/20/2021   Procedure: CARDIOVERSION;  Surgeon: Jerline Pain, MD;  Location: [facility];  Service: Cardiovascular;  Laterality: N/A;   COLONOSCOPY     CYSTOCELE REPAIR     DENTAL SURGERY     infected tooth - general   DILATION AND CURETTAGE OF UTERUS     x several   ELECTROPHYSIOLOGIC STUDY N/A 11/28/2015   Procedure: Atrial Fibrillation Ablation;  Surgeon: [name], MD;  Location: [facility];  Service: Cardiovascular;  Laterality: N/A;   EP IMPLANTABLE DEVICE N/A 06/08/2016   Procedure: Loop Recorder Insertion;  Surgeon: [name], MD;  Location: [facility];  Service: Cardiovascular;  Laterality: N/A;   EXAMINATION UNDER ANESTHESIA  N/A 01/15/2014   Procedure: EXAM UNDER ANESTHESIA with evacuation of hematoma and over sewing of vaginal mucosa.;  Surgeon: Claiborne Billings A. Pamala Hurry, MD;  Location: [facility];  Service: Gynecology;  Laterality: N/A;   HAND SURGERY     left   hemorrhoid injection  04/2011   implantable loop recorder removal  08/06/2021   MDT Reveal LINQ removed by Dr Rayann Heman   JOINT REPLACEMENT     left thumb replacement   MYRINGOTOMY WITH TUBE PLACEMENT Right 10/27/2018   TEE WITHOUT CARDIOVERSION N/A 09/27/2017   Procedure: TRANSESOPHAGEAL ECHOCARDIOGRAM (TEE);  Surgeon: Sueanne Margarita, MD;  Location: [facility];  Service: Cardiovascular;  Laterality: N/A;   TONSILLECTOMY       Current Outpatient Medications  Medication Sig Dispense Refill   acetaminophen (TYLENOL) 500 MG tablet Take 500-1,000 mg by mouth every 6 (six) hours as needed for moderate pain or headache.     apixaban (ELIQUIS) 5 MG TABS tablet TAKE 1 TABLET BY MOUTH TWICE A DAY 180 tablet 1   Biotin (BIOTIN 5000) 5 MG CAPS Take 5 mg by mouth daily with lunch.     Calcium Carb-Cholecalciferol (CALCIUM 600 + D PO) Take 1 tablet by mouth daily with lunch. 600 mg / 20 mcg     diclofenac Sodium (VOLTAREN) 1 % GEL Apply 1 Application topically as needed  (pain).     diltiazem (CARDIZEM CD) 300 MG 24 hr capsule Take 300 mg by mouth daily.     diltiazem (CARDIZEM) 30 MG tablet TAKE 1 TABLET BY MOUTH EVERY 4 HOURS AS NEEDED (FOR FAST HEART RATE > 100 AS LONG AS BP > 100). 135 tablet 1   famotidine-calcium carbonate-magnesium hydroxide (PEPCID COMPLETE) 10-800-165 MG chewable tablet Chew 1 tablet by mouth daily as needed (acid reflux).     flecainide (TAMBOCOR) 50 MG tablet TAKE 1 TABLET BY MOUTH TWICE A DAY 180 tablet 1   fluticasone (FLONASE) 50 MCG/ACT nasal spray Place 1 spray into both nostrils daily as needed for allergies or rhinitis.     levothyroxine (SYNTHROID, LEVOTHROID) 75 MCG tablet Take 75 mcg by mouth daily before breakfast.     nitrofurantoin, macrocrystal-monohydrate, (MACROBID) 100 MG capsule Take 100 mg by mouth every 12 (twelve) hours.     PARoxetine (PAXIL) 30 MG tablet Take 30 mg by mouth daily.     Probiotic Product (PROBIOTIC PO) Take 1 capsule by mouth daily with lunch.     Psyllium (METAMUCIL PO) Take 1 Dose by mouth daily. 1 dose = 1 teaspoon     ramipril (ALTACE) 10 MG capsule Take 10 mg by mouth 2 (two) times daily.      rosuvastatin (CRESTOR) 10 MG tablet Take 1 tablet (10 mg total) by mouth daily. 30 tablet 5   vitamin B-12 (CYANOCOBALAMIN) 1000 MCG tablet Take 1,000 mcg by mouth daily with lunch.     No current facility-administered medications for this encounter.    Allergies:   Pneumovax 23 [pneumococcal vac polyvalent], Codeine, Cephalexin, Ciprofloxacin, Sudafed [pseudoephedrine hcl], and Sulfa antibiotics   Social History:  The patient  reports that she has never smoked. She has never used smokeless tobacco. She reports that she does not drink alcohol and does not use drugs.   Family History:  The patient's  family history includes Diabetes in her father; Heart attack in her mother and sister; Heart disease in her brother, mother, and sister; Heart failure in her brother; Hypertension in her father and sister;  Stroke in her father; Sudden  death in her maternal grandfather.    ROS:  Please see the history of present illness.   All other systems are personally reviewed and negative.   Exam: GEN- The patient is well appearing, alert and oriented x 3 today.   Head- normocephalic, atraumatic Eyes-  Sclera clear, conjunctiva pink Ears- hearing intact Oropharynx- clear Neck- supple, no JVP Lymph- no cervical lymphadenopathy Lungs- Clear to ausculation bilaterally, normal work of breathing Heart- irregular rate and rhythm, no murmurs, rubs or gallops, PMI not laterally displaced GI- soft, NT, ND, + BS Extremities- no clubbing, cyanosis, or edema MS- no significant deformity or atrophy Skin- no rash or lesion Psych- euthymic mood, full affect Neuro- strength and sensation are intact    EKG:  Vent. rate 106 BPM PR interval 216 ms QRS duration 90 ms QT/QTcB 356/472 ms P-R-T axes * -67 81 Sinus tachycardia with 1st degree A-V block Left axis deviation Possible Anterior infarct , age undetermined Abnormal ECG When compared with ECG of 16-Mar-2022 15:53, PREVIOUS ECG IS PRESENT   ASSESSMENT AND PLAN:  1.Persisitent symptomatic atrial fibrillation/flutter  Maintaining SR  until the last 2 weeks, with now atypical atrial flutter, will schedule for cardioversion, pt has had CV's in the past, risk vrs benefit discussed Continue flecainide 50 mg bid  Continue apixaban 5 mg bid, no missed doses for at least 3 weeks  Continue Cardizem at  300 mg daily Use 30 mg cardizem as need if HR's are above 120 bpm We discussed that she is having failure of flecainide to keep her in rhythm After cardioversion, we will discuss elective admission for tikosyn and stop flecainide  Cbc/bmet/mag today   This patients CHA2DS2-VASc Score and unadjusted Ischemic Stroke Rate (% per year) is equal to 4.8 % stroke rate/year from a score of 4  Above score calculated as 1 point each if present [CHF, HTN, DM,  Vascular=MI/PAD/Aortic Plaque, Age if 65-74, or Female] Above score calculated as 2 points each if present [Age > 75, or Stroke/TIA/TE]   Follow-up:f/u in afib clinic one week after cardioversion   Current medicines are reviewed at length with the patient today.   The patient does not have concerns regarding her medicines.  The following changes were made today:  none  Labs/ tests ordered today include: pre procedure cbc/bmet/ covid screen Orders Placed This Encounter  Procedures   CBC   Basic metabolic panel   Magnesium   EKG 12-Lead     Signed, Roderic Palau NP  06/23/2022 3:13 PM  Afib [facility] [address] [place], [place] [address] [phone]

## 2022-06-23 NOTE — H&P (View-Only) (Signed)
Due to national recommendations of social distancing due to [place] 19, Audio/video telehealth visit is felt to be most appropriate for this patient at this time.  See MyChart message/consent below  from today for patient consent regarding telehealth for the [facility].    Date:  06/23/2022   ID:  [name], DOB 07-Apr-1934, MRN [id number]  Location: Office  Provider location: [place], [place] [address] Evaluation Performed: f/u persistent afib  PCP:  Lajean Manes, MD  Primary Cardiologist:   Dr. Marlou Porch  Primary Electrophysiologist: Dr. Rayann Heman   CC: persistent afib since last week    History of Present Illness: [name] is a 88 y.o. female who presents  for persistent afib since last week.  Pt with h/o of 2 prior ablation 2017 and most recent 09/2017 that has experienced persistent afib since last week. No known triggers. Her PCP recently increased cardizem for BP issues but she has not started. V rate 129 bpm today. She had several days of afib last May but converted on her own and DCCV was cancelled. Continues on eliquis with a CHA2DS2VASc score of 4.   F/u in afib clinic, 10/11/19, after successful cardioversion.  She remains in SR. She feels improved. Has noted mild ankle edema since increasing cardizem to 300 mg daily. Received her second covid shot this last week.   F/u in afib slinc 05/27/20. She went into persistent afib one week ago. No change in health,  although she does tell me she loss her husband in April. Her adult son with Parkinson's is living with her since being dx'ed and his wife asking him for a divorce. She has both vacccines and no missed anticoagulation.   F/u in afib clinic 07/08/20. She spontaneously reverted to SR so cardioversion was cancelled. She then had f/u with Dr. Marlou Porch  and was in afib. She was scheduled an appointment with Dr. Rayann Heman and he gave her several options but she wanted to revisit flecainide  and was started on 50 mg bid. She is now in the office and is in SR with flecainide 50 mg bid.. She has a first degree block on flecainide similar  to EKG's on flecainide in the past. She feels well. No afib since start of flecainide.   F/u in afib clinic, 07/01/21. She  remains in SR. No issues with afib. Continues on flecainide 50 mg bid. No issues with Eliquis 5 mg bid for a CHA2DS2VASc  score of 5.   F/u afib clinic, 08/27/21. Pt was seen by Dr. Rayann Heman 10/25 and was found to be in atypical atrial flutter for around 2 weeks. She was set up for cardioversion. Her loop monitor was explanted at that visit. Cardioversion 08/20/21 was successful. She was told if ERAF then he would consider another ablation.   F/u in afib clinic, 02/17/22. She reports that she is doing well, no afib to report.   F/u in afib clinic 03/04/22 as she felt she went into afib a few days earlier. EKG today shows atrial flutter  at 110 bpm. Out of rhythm since last Tuesday. No know trigger.   Pt was seen back for  EKG prior to cardioversion early July as she spontaneously converted, DCCV cancelled.   She is now in the afib clinic, 10/23. She has been out of rhythm x 2 weeks. Despite one day of extra flecainide and taking 30 mg Cardizem as needed. She is in atypical atrial flutter  today with v rates around 100 bpm. We discussed change of antiarrythmic and would like  to  discuss elective admit for Tikosyn after she is cardioverted back to SR. She would prefer not to  use amiodarone. No missed anticoagulation for the last 21 days.   Today, she denies symptoms of palpitations, chest pain, shortness of breath, orthopnea, PND, lower extremity edema, claudication, dizziness, presyncope, syncope, bleeding, or neurologic sequela. The patient is tolerating medications without difficulties and is otherwise without complaint today.   she denies symptoms of cough, fevers, chills, or new SOB worrisome for COVID 19.    she has a BMI of Body  mass index is 30.45 kg/m.Marland Kitchen Filed Weights   06/23/22 1351  Weight: 83 kg     Past Medical History:  Diagnosis Date   Arthritis    hands, back   Asthmatic bronchitis    Atrial fibrillation (HCC) 07/31/2013   Chronic anticoagulation 07/30/2014   Chronic kidney disease    stage 3 renal disease - no med   Dizziness 07/30/2014   Dysrhythmia    Hx - a-fib 05/2010 - tx with meds, no problem since 05/2010   First degree AV block 07/30/2014   Hearing loss    bilateral hearing aids   Hypertension    controlled with meds   Hypothyroidism    Internal hemorrhoid    PAF (paroxysmal atrial fibrillation) ([facility]) 11/28/2015   Paroxysmal atrial fibrillation (HCC)    Peripheral vascular disease ([place])    right arm and right shoulder blood clots r/t a fall   Persistent atrial fibrillation ([facility]) 09/28/2017   Rectal bleeding    SVD (spontaneous vaginal delivery) 1957, 1959   x 2   Thyroid disease    hypothyroidism   Past Surgical History:  Procedure Laterality Date   ABDOMINAL HYSTERECTOMY     and right ovary   ANTERIOR AND POSTERIOR REPAIR N/A 01/03/2014   Procedure: ANTERIOR (CYSTOCELE) REPAIR;  Surgeon: Claiborne Billings A. Pamala Hurry, MD;  Location: [facility];  Service: Gynecology;  Laterality: N/A;   APPENDECTOMY     ATRIAL FIBRILLATION ABLATION N/A 09/28/2017   Procedure: ATRIAL FIBRILLATION ABLATION;  Surgeon: [name], MD;  Location: [facility];  Service: Cardiovascular;  Laterality: N/A;   BACK SURGERY     x 3 - 2 rods and 8 screws   CARDIOVERSION N/A 08/24/2017   Procedure: CARDIOVERSION;  Surgeon: Jerline Pain, MD;  Location: [facility];  Service: Cardiovascular;  Laterality: N/A;   CARDIOVERSION N/A 10/19/2017   Procedure: CARDIOVERSION;  Surgeon: Josue Hector, MD;  Location: [facility];  Service: Cardiovascular;  Laterality: N/A;   CARDIOVERSION N/A 10/04/2019   Procedure: CARDIOVERSION;  Surgeon: Pixie Casino, MD;  Location: [facility];  Service: Cardiovascular;   Laterality: N/A;   CARDIOVERSION N/A 08/20/2021   Procedure: CARDIOVERSION;  Surgeon: Jerline Pain, MD;  Location: [facility];  Service: Cardiovascular;  Laterality: N/A;   COLONOSCOPY     CYSTOCELE REPAIR     DENTAL SURGERY     infected tooth - general   DILATION AND CURETTAGE OF UTERUS     x several   ELECTROPHYSIOLOGIC STUDY N/A 11/28/2015   Procedure: Atrial Fibrillation Ablation;  Surgeon: [name], MD;  Location: [facility];  Service: Cardiovascular;  Laterality: N/A;   EP IMPLANTABLE DEVICE N/A 06/08/2016   Procedure: Loop Recorder Insertion;  Surgeon: [name], MD;  Location: [facility];  Service: Cardiovascular;  Laterality: N/A;   EXAMINATION UNDER ANESTHESIA  N/A 01/15/2014   Procedure: EXAM UNDER ANESTHESIA with evacuation of hematoma and over sewing of vaginal mucosa.;  Surgeon: Claiborne Billings A. Pamala Hurry, MD;  Location: [facility];  Service: Gynecology;  Laterality: N/A;   HAND SURGERY     left   hemorrhoid injection  04/2011   implantable loop recorder removal  08/06/2021   MDT Reveal LINQ removed by Dr Rayann Heman   JOINT REPLACEMENT     left thumb replacement   MYRINGOTOMY WITH TUBE PLACEMENT Right 10/27/2018   TEE WITHOUT CARDIOVERSION N/A 09/27/2017   Procedure: TRANSESOPHAGEAL ECHOCARDIOGRAM (TEE);  Surgeon: Sueanne Margarita, MD;  Location: [facility];  Service: Cardiovascular;  Laterality: N/A;   TONSILLECTOMY       Current Outpatient Medications  Medication Sig Dispense Refill   acetaminophen (TYLENOL) 500 MG tablet Take 500-1,000 mg by mouth every 6 (six) hours as needed for moderate pain or headache.     apixaban (ELIQUIS) 5 MG TABS tablet TAKE 1 TABLET BY MOUTH TWICE A DAY 180 tablet 1   Biotin (BIOTIN 5000) 5 MG CAPS Take 5 mg by mouth daily with lunch.     Calcium Carb-Cholecalciferol (CALCIUM 600 + D PO) Take 1 tablet by mouth daily with lunch. 600 mg / 20 mcg     diclofenac Sodium (VOLTAREN) 1 % GEL Apply 1 Application topically as needed  (pain).     diltiazem (CARDIZEM CD) 300 MG 24 hr capsule Take 300 mg by mouth daily.     diltiazem (CARDIZEM) 30 MG tablet TAKE 1 TABLET BY MOUTH EVERY 4 HOURS AS NEEDED (FOR FAST HEART RATE > 100 AS LONG AS BP > 100). 135 tablet 1   famotidine-calcium carbonate-magnesium hydroxide (PEPCID COMPLETE) 10-800-165 MG chewable tablet Chew 1 tablet by mouth daily as needed (acid reflux).     flecainide (TAMBOCOR) 50 MG tablet TAKE 1 TABLET BY MOUTH TWICE A DAY 180 tablet 1   fluticasone (FLONASE) 50 MCG/ACT nasal spray Place 1 spray into both nostrils daily as needed for allergies or rhinitis.     levothyroxine (SYNTHROID, LEVOTHROID) 75 MCG tablet Take 75 mcg by mouth daily before breakfast.     nitrofurantoin, macrocrystal-monohydrate, (MACROBID) 100 MG capsule Take 100 mg by mouth every 12 (twelve) hours.     PARoxetine (PAXIL) 30 MG tablet Take 30 mg by mouth daily.     Probiotic Product (PROBIOTIC PO) Take 1 capsule by mouth daily with lunch.     Psyllium (METAMUCIL PO) Take 1 Dose by mouth daily. 1 dose = 1 teaspoon     ramipril (ALTACE) 10 MG capsule Take 10 mg by mouth 2 (two) times daily.      rosuvastatin (CRESTOR) 10 MG tablet Take 1 tablet (10 mg total) by mouth daily. 30 tablet 5   vitamin B-12 (CYANOCOBALAMIN) 1000 MCG tablet Take 1,000 mcg by mouth daily with lunch.     No current facility-administered medications for this encounter.    Allergies:   Pneumovax 23 [pneumococcal vac polyvalent], Codeine, Cephalexin, Ciprofloxacin, Sudafed [pseudoephedrine hcl], and Sulfa antibiotics   Social History:  The patient  reports that she has never smoked. She has never used smokeless tobacco. She reports that she does not drink alcohol and does not use drugs.   Family History:  The patient's  family history includes Diabetes in her father; Heart attack in her mother and sister; Heart disease in her brother, mother, and sister; Heart failure in her brother; Hypertension in her father and sister;  Stroke in her father; Sudden  death in her maternal grandfather.    ROS:  Please see the history of present illness.   All other systems are personally reviewed and negative.   Exam: GEN- The patient is well appearing, alert and oriented x 3 today.   Head- normocephalic, atraumatic Eyes-  Sclera clear, conjunctiva pink Ears- hearing intact Oropharynx- clear Neck- supple, no JVP Lymph- no cervical lymphadenopathy Lungs- Clear to ausculation bilaterally, normal work of breathing Heart- irregular rate and rhythm, no murmurs, rubs or gallops, PMI not laterally displaced GI- soft, NT, ND, + BS Extremities- no clubbing, cyanosis, or edema MS- no significant deformity or atrophy Skin- no rash or lesion Psych- euthymic mood, full affect Neuro- strength and sensation are intact    EKG:  Vent. rate 106 BPM PR interval 216 ms QRS duration 90 ms QT/QTcB 356/472 ms P-R-T axes * -67 81 Sinus tachycardia with 1st degree A-V block Left axis deviation Possible Anterior infarct , age undetermined Abnormal ECG When compared with ECG of 16-Mar-2022 15:53, PREVIOUS ECG IS PRESENT   ASSESSMENT AND PLAN:  1.Persisitent symptomatic atrial fibrillation/flutter  Maintaining SR  until the last 2 weeks, with now atypical atrial flutter, will schedule for cardioversion, pt has had CV's in the past, risk vrs benefit discussed Continue flecainide 50 mg bid  Continue apixaban 5 mg bid, no missed doses for at least 3 weeks  Continue Cardizem at  300 mg daily Use 30 mg cardizem as need if HR's are above 120 bpm We discussed that she is having failure of flecainide to keep her in rhythm After cardioversion, we will discuss elective admission for tikosyn and stop flecainide  Cbc/bmet/mag today   This patients CHA2DS2-VASc Score and unadjusted Ischemic Stroke Rate (% per year) is equal to 4.8 % stroke rate/year from a score of 4  Above score calculated as 1 point each if present [CHF, HTN, DM,  Vascular=MI/PAD/Aortic Plaque, Age if 65-74, or Female] Above score calculated as 2 points each if present [Age > 75, or Stroke/TIA/TE]   Follow-up:f/u in afib clinic one week after cardioversion   Current medicines are reviewed at length with the patient today.   The patient does not have concerns regarding her medicines.  The following changes were made today:  none  Labs/ tests ordered today include: pre procedure cbc/bmet/ covid screen Orders Placed This Encounter  Procedures   CBC   Basic metabolic panel   Magnesium   EKG 12-Lead     Signed, Roderic Palau NP  06/23/2022 3:13 PM  [facility] [address] [place], [place] [address] [phone]

## 2022-07-01 ENCOUNTER — Ambulatory Visit (HOSPITAL_COMMUNITY): Payer: Medicare Other | Admitting: Nurse Practitioner

## 2022-07-02 ENCOUNTER — Other Ambulatory Visit: Payer: Self-pay

## 2022-07-02 ENCOUNTER — Ambulatory Visit (HOSPITAL_COMMUNITY)
Admission: RE | Admit: 2022-07-02 | Discharge: 2022-07-02 | Disposition: A | Discharge status: Home / Self Care | Payer: Medicare Other | Source: Ambulatory Visit | Attending: Cardiovascular Disease | Admitting: Cardiovascular Disease

## 2022-07-02 ENCOUNTER — Encounter (HOSPITAL_COMMUNITY): Payer: Self-pay | Admitting: Cardiovascular Disease

## 2022-07-02 ENCOUNTER — Encounter (HOSPITAL_COMMUNITY)
Admission: RE | Disposition: A | Discharge status: Home / Self Care | Payer: Self-pay | Source: Ambulatory Visit | Attending: Cardiovascular Disease

## 2022-07-02 ENCOUNTER — Ambulatory Visit (HOSPITAL_BASED_OUTPATIENT_CLINIC_OR_DEPARTMENT_OTHER): Payer: Medicare Other | Admitting: Anesthesiology

## 2022-07-02 ENCOUNTER — Ambulatory Visit (HOSPITAL_COMMUNITY): Payer: Medicare Other | Admitting: Anesthesiology

## 2022-07-02 DIAGNOSIS — I4892 Unspecified atrial flutter: Secondary | ICD-10-CM

## 2022-07-02 DIAGNOSIS — I2510 Atherosclerotic heart disease of native coronary artery without angina pectoris: Secondary | ICD-10-CM

## 2022-07-02 DIAGNOSIS — Z79899 Other long term (current) drug therapy: Secondary | ICD-10-CM | POA: Insufficient documentation

## 2022-07-02 DIAGNOSIS — I10 Essential (primary) hypertension: Secondary | ICD-10-CM | POA: Insufficient documentation

## 2022-07-02 DIAGNOSIS — I739 Peripheral vascular disease, unspecified: Secondary | ICD-10-CM | POA: Insufficient documentation

## 2022-07-02 DIAGNOSIS — E039 Hypothyroidism, unspecified: Secondary | ICD-10-CM | POA: Insufficient documentation

## 2022-07-02 DIAGNOSIS — N289 Disorder of kidney and ureter, unspecified: Secondary | ICD-10-CM

## 2022-07-02 DIAGNOSIS — I4891 Unspecified atrial fibrillation: Secondary | ICD-10-CM | POA: Diagnosis not present

## 2022-07-02 DIAGNOSIS — K219 Gastro-esophageal reflux disease without esophagitis: Secondary | ICD-10-CM | POA: Diagnosis not present

## 2022-07-02 DIAGNOSIS — I4819 Other persistent atrial fibrillation: Secondary | ICD-10-CM | POA: Diagnosis not present

## 2022-07-02 HISTORY — PX: CARDIOVERSION: SHX1299

## 2022-07-02 SURGERY — CARDIOVERSION
Anesthesia: General

## 2022-07-02 MED ORDER — PROPOFOL 10 MG/ML IV BOLUS
INTRAVENOUS | Status: DC | PRN
  Administered 2022-07-02: 60 mg via INTRAVENOUS

## 2022-07-02 MED ORDER — SODIUM CHLORIDE 0.9 % IV SOLN: INTRAVENOUS | Status: DC | PRN

## 2022-07-02 MED ORDER — LIDOCAINE 2% (20 MG/ML) 5 ML SYRINGE
INTRAMUSCULAR | Status: DC | PRN
  Administered 2022-07-02: 40 mg via INTRAVENOUS

## 2022-07-02 MED ORDER — SODIUM CHLORIDE 0.9 % IV SOLN: INTRAVENOUS | Status: DC

## 2022-07-02 NOTE — Interval H&P Note (Signed)
History and Physical Interval Note:  07/02/2022 10:01 AM  Frances [name]  has presented today for surgery, with the diagnosis of AFIB.  The various methods of treatment have been discussed with the patient and family. After consideration of risks, benefits and other options for treatment, the patient has consented to  Procedure(s): CARDIOVERSION (N/A) as a surgical intervention.  The patient's history has been reviewed, patient examined, no change in status, stable for surgery.  I have reviewed the patient's chart and labs.  Questions were answered to the patient's satisfaction.    NPO for DCCV. On eliquis >3 weeks.   Lake Bells T. Audie Box, MD, [place]  [address] [place], [place] [address] [phone]  10:01 AM

## 2022-07-02 NOTE — Transfer of Care (Signed)
Immediate Anesthesia Transfer of Care Note  Patient: [name]  Procedure(s) Performed: CARDIOVERSION  Patient Location: Endoscopy Unit  Anesthesia Type:General  Level of Consciousness: drowsy  Airway & Oxygen Therapy: Patient Spontanous Breathing  Post-op Assessment: Report given to RN and Post -op Vital signs reviewed and stable  Post vital signs: Reviewed and stable  Last Vitals:  Vitals Value Taken Time  BP 128/81   Temp    Pulse 71   Resp 12   SpO2 95%     Last Pain:  Vitals:   07/02/22 0934  TempSrc: Temporal  PainSc: 0-No pain         Complications: No notable events documented.

## 2022-07-02 NOTE — Anesthesia Preprocedure Evaluation (Addendum)
Anesthesia Evaluation  Patient identified by MRN, date of birth, ID band Patient awake    Reviewed: Allergy & Precautions, NPO status , Patient's Chart, lab work & pertinent test results  Airway Mallampati: I  TM Distance: >3 FB Neck ROM: Full    Dental  (+) Teeth Intact, Dental Advisory Given   Pulmonary asthma ,    breath sounds clear to auscultation       Cardiovascular hypertension, Pt. on medications + CAD and + Peripheral Vascular Disease  + dysrhythmias Atrial Fibrillation + pacemaker  Rhythm:Irregular Rate:Tachycardia  Echo: - Left ventricle: Systolic function was normal. The estimated  ejection fraction was in the range of 55% to 60%. Wall motion was  normal; there were no regional wall motion abnormalities.  - Aortic valve: Trileaflet; mildly thickened, mildly calcified  leaflets.  - Mitral valve: There was mild to moderate regurgitation, with  multiple jets.  - Left atrium: The atrium was moderately dilated. No evidence of  thrombus in the atrial cavity or appendage. There was  moderatecontinuous spontaneous echo contrast ("smoke") in the  cavity and the appendage. The appendage was morphologically a  left appendage, multilobulated, and of normal size. Emptying  velocity was moderately reduced.  - Right atrium: The atrium was dilated. No evidence of thrombus in  the atrial cavity or appendage.  - Atrial septum: No defect or patent foramen ovale was identified.   Neuro/Psych negative neurological ROS  negative psych ROS   GI/Hepatic Neg liver ROS, GERD  Medicated,  Endo/Other  Hypothyroidism   Renal/GU Renal disease     Musculoskeletal  (+) Arthritis ,   Abdominal Normal abdominal exam  (+)   Peds  Hematology negative hematology ROS (+)   Anesthesia Other Findings   Reproductive/Obstetrics                            Anesthesia Physical Anesthesia  Plan  ASA: 3  Anesthesia Plan: General   Post-op Pain Management:    Induction: Intravenous  PONV Risk Score and Plan: 0  Airway Management Planned: Natural Airway and Mask  Additional Equipment: None  Intra-op Plan:   Post-operative Plan:   Informed Consent: I have reviewed the patients History and Physical, chart, labs and discussed the procedure including the risks, benefits and alternatives for the proposed anesthesia with the patient or authorized representative who has indicated his/her understanding and acceptance.       Plan Discussed with: CRNA  Anesthesia Plan Comments:        Anesthesia Quick Evaluation

## 2022-07-02 NOTE — CV Procedure (Signed)
   DIRECT CURRENT CARDIOVERSION  NAME:  Frances [name]    MRN: [id number] DOB:  25-Jun-1934    ADMIT DATE: 07/02/2022  Indication:  Symptomatic atrial flutter  Procedure Note:  The patient signed informed consent.  They have had had therapeutic anticoagulation with eliquis greater than 3 weeks.  Anesthesia was administered by Dr. Smith Robert.  Adequate airway was maintained throughout and vital followed per protocol.  They were cardioverted x 1 with 200J of biphasic synchronized energy.  They converted to NSR.  There were no apparent complications.  The patient had normal neuro status and respiratory status post procedure with vitals stable as recorded elsewhere.    Follow up: They will continue on current medical therapy and follow up with cardiology as scheduled.  Lake Bells T. Audie Box, MD, [place]  [address], [place], [place] [address] [phone]  10:19 AM

## 2022-07-02 NOTE — Anesthesia Postprocedure Evaluation (Signed)
Anesthesia Post Note  Patient: Frances [name]  Procedure(s) Performed: CARDIOVERSION     Patient location during evaluation: PACU Anesthesia Type: General Level of consciousness: awake and alert Pain management: pain level controlled Vital Signs Assessment: post-procedure vital signs reviewed and stable Respiratory status: spontaneous breathing, nonlabored ventilation, respiratory function stable and patient connected to nasal cannula oxygen Cardiovascular status: blood pressure returned to baseline and stable Postop Assessment: no apparent nausea or vomiting Anesthetic complications: no   No notable events documented.  Last Vitals:  Vitals:   07/02/22 1040 07/02/22 1050  BP: 134/80 (!) 143/82  Pulse: 73 69  Resp: 16 17  Temp:    SpO2: 94% 93%    Last Pain:  Vitals:   07/02/22 1050  TempSrc:   PainSc: 0-No pain                 Effie Berkshire

## 2022-07-02 NOTE — Discharge Instructions (Signed)

## 2022-07-05 ENCOUNTER — Encounter (HOSPITAL_COMMUNITY): Payer: Self-pay | Admitting: Cardiovascular Disease

## 2022-07-06 ENCOUNTER — Telehealth: Payer: Self-pay | Admitting: Cardiology

## 2022-07-06 DIAGNOSIS — I4819 Other persistent atrial fibrillation: Secondary | ICD-10-CM

## 2022-07-06 NOTE — Telephone Encounter (Signed)
Pt has cardioversion 10/26 and went back into [facility] on Friday.  She would like to know how Dr Marlou Porch feels about her being changed to dofetilide.  It was mentioned to her in the [facility] clinic that she may need to be changed to it.  Advised pt I will have Dr Marlou Porch to review and call back with his thoughts.

## 2022-07-06 NOTE — Telephone Encounter (Signed)
Pt c/o medication issue:  1. Name of Medication: dofetilide  2. How are you currently taking this medication (dosage and times per day)?    3. Are you having a reaction (difficulty breathing--STAT)? no  4. What is your medication issue? Calling to get Dr. Kingsley Plan opinion on going on this medication. Please advise

## 2022-07-08 NOTE — Telephone Encounter (Signed)
She previously has seen Dr. Rayann Heman in the past.  Lets have her see EP again to discuss ablation versus medication.  [name], MD   Pt aware and agreeable to see EP to discuss possible ablation.

## 2022-07-10 ENCOUNTER — Ambulatory Visit (HOSPITAL_COMMUNITY)
Admission: RE | Admit: 2022-07-10 | Discharge: 2022-07-10 | Disposition: A | Discharge status: Home / Self Care | Payer: Medicare Other | Source: Ambulatory Visit | Attending: Nurse Practitioner | Admitting: Nurse Practitioner

## 2022-07-10 ENCOUNTER — Encounter (HOSPITAL_COMMUNITY): Payer: Self-pay | Admitting: Nurse Practitioner

## 2022-07-10 VITALS — BP 134/88 | HR 106 | Ht 64.0 in | Wt 181.2 lb

## 2022-07-10 DIAGNOSIS — D6869 Other thrombophilia: Secondary | ICD-10-CM

## 2022-07-10 DIAGNOSIS — I484 Atypical atrial flutter: Secondary | ICD-10-CM

## 2022-07-10 DIAGNOSIS — I480 Paroxysmal atrial fibrillation: Secondary | ICD-10-CM

## 2022-07-10 DIAGNOSIS — R000 Tachycardia, unspecified: Secondary | ICD-10-CM | POA: Diagnosis not present

## 2022-07-10 NOTE — Progress Notes (Signed)
Date:  06/23/2022   ID:  Frances [name], DOB 03/12/1934, MRN [id number]  Location: Office  Provider location: [place], [place] [address] Evaluation Performed: f/u persistent afib  PCP:  Lajean Manes, MD  Primary Cardiologist:   Dr. Marlou Porch  Primary Electrophysiologist: previous Dr. Rayann Heman   CC: persistent afib since last week    History of Present Illness: Frances [name] is a 88 y.o. female who presents  for persistent afib since last week.  Pt with h/o of 2 prior ablation 2017 and most recent 09/2017 that has experienced persistent afib since last week. No known triggers. Her PCP recently increased cardizem for BP issues but she has not started. V rate 129 bpm today. She had several days of afib last May but converted on her own and DCCV was cancelled. Continues on eliquis with a CHA2DS2VASc score of 4.   F/u in afib clinic, 10/11/19, after successful cardioversion.  She remains in SR. She feels improved. Has noted mild ankle edema since increasing cardizem to 300 mg daily. Received her second covid shot this last week.   F/u in afib slinc 05/27/20. She went into persistent afib one week ago. No change in health,  although she does tell me she loss her husband in April. Her adult son with Parkinson's is living with her since being dx'ed and his wife asking him for a divorce. She has both vacccines and no missed anticoagulation.   F/u in afib clinic 07/08/20. She spontaneously reverted to SR so cardioversion was cancelled. She then had f/u with Dr. Marlou Porch  and was in afib. She was scheduled an appointment with Dr. Rayann Heman and he gave her several options but she wanted to revisit flecainide and was started on 50 mg bid. She is now in the office and is in SR with flecainide 50 mg bid.. She has a first degree block on flecainide similar  to EKG's on flecainide in the past. She feels well. No afib since start of flecainide.   F/u in afib clinic, 07/01/21. She   remains in SR. No issues with afib. Continues on flecainide 50 mg bid. No issues with Eliquis 5 mg bid for a CHA2DS2VASc  score of 5.   F/u afib clinic, 08/27/21. Pt was seen by Dr. Rayann Heman 10/25 and was found to be in atypical atrial flutter for around 2 weeks. She was set up for cardioversion. Her loop monitor was explanted at that visit. Cardioversion 08/20/21 was successful. She was told if ERAF then he would consider another ablation.   F/u in afib clinic, 02/17/22. She reports that she is doing well, no afib to report.   F/u in afib clinic 03/04/22 as she felt she went into afib a few days earlier. EKG today shows atrial flutter  at 110 bpm. Out of rhythm since last Tuesday. No know trigger.   Pt was seen back for  EKG prior to cardioversion early July as she spontaneously converted, DCCV cancelled.   She is now in the afib clinic, 10/23. She has been out of rhythm x 2 weeks. Despite one day of extra flecainide and taking 30 mg Cardizem as needed. She is in atypical atrial flutter  today with v rates around 100 bpm. We discussed change of antiarrythmic and would like  to  discuss elective admit for Tikosyn after she is cardioverted back to SR. She would prefer not to  use amiodarone. No missed anticoagulation for the last  21 days.   07/10/22. She is here to f/u successful cardioversion. Unfortunately, she has had return of arrhythmia. It appears to be an atypical atrial flutter at 106 bpm. We discussed use of Tikosyn in the past but she would refer to discuss with a  EP first, she is waiting to get an appointment to reestablish  with EP in Dr. Jackalyn Lombard absence to discuss.  Today, she denies symptoms of palpitations, chest pain, shortness of breath, orthopnea, PND, lower extremity edema, claudication, dizziness, presyncope, syncope, bleeding, or neurologic sequela. The patient is tolerating medications without difficulties and is otherwise without complaint today.   she denies symptoms of cough,  fevers, chills, or new SOB worrisome for COVID 19.    she has a BMI of Body mass index is 30.45 kg/m.Marland Kitchen Filed Weights   06/23/22 1351  Weight: 83 kg     Past Medical History:  Diagnosis Date   Arthritis    hands, back   Asthmatic bronchitis    Atrial fibrillation (HCC) 07/31/2013   Chronic anticoagulation 07/30/2014   Chronic kidney disease    stage 3 renal disease - no med   Dizziness 07/30/2014   Dysrhythmia    Hx - a-fib 05/2010 - tx with meds, no problem since 05/2010   First degree AV block 07/30/2014   Hearing loss    bilateral hearing aids   Hypertension    controlled with meds   Hypothyroidism    Internal hemorrhoid    PAF (paroxysmal atrial fibrillation) ([place]) 11/28/2015   Paroxysmal atrial fibrillation (HCC)    Peripheral vascular disease ([place])    right arm and right shoulder blood clots r/t a fall   Persistent atrial fibrillation ([place]) 09/28/2017   Rectal bleeding    SVD (spontaneous vaginal delivery) 1957, 1959   x 2   Thyroid disease    hypothyroidism   Past Surgical History:  Procedure Laterality Date   ABDOMINAL HYSTERECTOMY     and right ovary   ANTERIOR AND POSTERIOR REPAIR N/A 01/03/2014   Procedure: ANTERIOR (CYSTOCELE) REPAIR;  Surgeon: Claiborne Billings A. Pamala Hurry, MD;  Location: [facility];  Service: Gynecology;  Laterality: N/A;   APPENDECTOMY     ATRIAL FIBRILLATION ABLATION N/A 09/28/2017   Procedure: ATRIAL FIBRILLATION ABLATION;  Surgeon: [name], MD;  Location: [facility];  Service: Cardiovascular;  Laterality: N/A;   BACK SURGERY     x 3 - 2 rods and 8 screws   CARDIOVERSION N/A 08/24/2017   Procedure: CARDIOVERSION;  Surgeon: Jerline Pain, MD;  Location: [facility];  Service: Cardiovascular;  Laterality: N/A;   CARDIOVERSION N/A 10/19/2017   Procedure: CARDIOVERSION;  Surgeon: Josue Hector, MD;  Location: [facility];  Service: Cardiovascular;  Laterality: N/A;   CARDIOVERSION N/A 10/04/2019   Procedure: CARDIOVERSION;  Surgeon:  Pixie Casino, MD;  Location: [facility];  Service: Cardiovascular;  Laterality: N/A;   CARDIOVERSION N/A 08/20/2021   Procedure: CARDIOVERSION;  Surgeon: Jerline Pain, MD;  Location: [facility];  Service: Cardiovascular;  Laterality: N/A;   COLONOSCOPY     CYSTOCELE REPAIR     DENTAL SURGERY     infected tooth - general   DILATION AND CURETTAGE OF UTERUS     x several   ELECTROPHYSIOLOGIC STUDY N/A 11/28/2015   Procedure: Atrial Fibrillation Ablation;  Surgeon: [name], MD;  Location: [facility];  Service: Cardiovascular;  Laterality: N/A;   EP IMPLANTABLE DEVICE N/A 06/08/2016   Procedure: Loop Recorder Insertion;  Surgeon:  [name], MD;  Location: [facility];  Service: Cardiovascular;  Laterality: N/A;   EXAMINATION UNDER ANESTHESIA N/A 01/15/2014   Procedure: EXAM UNDER ANESTHESIA with evacuation of hematoma and over sewing of vaginal mucosa.;  Surgeon: Floyce Stakes. Pamala Hurry, MD;  Location: [facility];  Service: Gynecology;  Laterality: N/A;   HAND SURGERY     left   hemorrhoid injection  04/2011   implantable loop recorder removal  08/06/2021   MDT Reveal LINQ removed by Dr Rayann Heman   JOINT REPLACEMENT     left thumb replacement   MYRINGOTOMY WITH TUBE PLACEMENT Right 10/27/2018   TEE WITHOUT CARDIOVERSION N/A 09/27/2017   Procedure: TRANSESOPHAGEAL ECHOCARDIOGRAM (TEE);  Surgeon: Sueanne Margarita, MD;  Location: [facility];  Service: Cardiovascular;  Laterality: N/A;   TONSILLECTOMY       Current Outpatient Medications  Medication Sig Dispense Refill   acetaminophen (TYLENOL) 500 MG tablet Take 500-1,000 mg by mouth every 6 (six) hours as needed for moderate pain or headache.     apixaban (ELIQUIS) 5 MG TABS tablet TAKE 1 TABLET BY MOUTH TWICE A DAY 180 tablet 1   Biotin (BIOTIN 5000) 5 MG CAPS Take 5 mg by mouth daily with lunch.     Calcium Carb-Cholecalciferol (CALCIUM 600 + D PO) Take 1 tablet by mouth daily with lunch. 600 mg / 20 mcg      diclofenac Sodium (VOLTAREN) 1 % GEL Apply 1 Application topically as needed (pain).     diltiazem (CARDIZEM CD) 300 MG 24 hr capsule Take 300 mg by mouth daily.     diltiazem (CARDIZEM) 30 MG tablet TAKE 1 TABLET BY MOUTH EVERY 4 HOURS AS NEEDED (FOR FAST HEART RATE > 100 AS LONG AS BP > 100). 135 tablet 1   famotidine-calcium carbonate-magnesium hydroxide (PEPCID COMPLETE) 10-800-165 MG chewable tablet Chew 1 tablet by mouth daily as needed (acid reflux).     flecainide (TAMBOCOR) 50 MG tablet TAKE 1 TABLET BY MOUTH TWICE A DAY 180 tablet 1   fluticasone (FLONASE) 50 MCG/ACT nasal spray Place 1 spray into both nostrils daily as needed for allergies or rhinitis.     levothyroxine (SYNTHROID, LEVOTHROID) 75 MCG tablet Take 75 mcg by mouth daily before breakfast.     nitrofurantoin, macrocrystal-monohydrate, (MACROBID) 100 MG capsule Take 100 mg by mouth every 12 (twelve) hours.     PARoxetine (PAXIL) 30 MG tablet Take 30 mg by mouth daily.     Probiotic Product (PROBIOTIC PO) Take 1 capsule by mouth daily with lunch.     Psyllium (METAMUCIL PO) Take 1 Dose by mouth daily. 1 dose = 1 teaspoon     ramipril (ALTACE) 10 MG capsule Take 10 mg by mouth 2 (two) times daily.      rosuvastatin (CRESTOR) 10 MG tablet Take 1 tablet (10 mg total) by mouth daily. 30 tablet 5   vitamin B-12 (CYANOCOBALAMIN) 1000 MCG tablet Take 1,000 mcg by mouth daily with lunch.     No current facility-administered medications for this encounter.    Allergies:   Pneumovax 23 [pneumococcal vac polyvalent], Codeine, Cephalexin, Ciprofloxacin, Sudafed [pseudoephedrine hcl], and Sulfa antibiotics   Social History:  The patient  reports that she has never smoked. She has never used smokeless tobacco. She reports that she does not drink alcohol and does not use drugs.   Family History:  The patient's  family history includes Diabetes in her father; Heart attack in her mother and sister; Heart disease in her brother,  mother, and  sister; Heart failure in her brother; Hypertension in her father and sister; Stroke in her father; Sudden death in her maternal grandfather.    ROS:  Please see the history of present illness.   All other systems are personally reviewed and negative.   Exam: GEN- The patient is well appearing, alert and oriented x 3 today.   Head- normocephalic, atraumatic Eyes-  Sclera clear, conjunctiva pink Ears- hearing intact Oropharynx- clear Neck- supple, no JVP Lymph- no cervical lymphadenopathy Lungs- Clear to ausculation bilaterally, normal work of breathing Heart- irregular rate and rhythm, no murmurs, rubs or gallops, PMI not laterally displaced GI- soft, NT, ND, + BS Extremities- no clubbing, cyanosis, or edema MS- no significant deformity or atrophy Skin- no rash or lesion Psych- euthymic mood, full affect Neuro- strength and sensation are intact    EKG: probable atrial flutter at 106 bpm,  qrs int 92 ms, qtc 456 ms    ASSESSMENT AND PLAN:  1.Persisitent symptomatic atrial fibrillation/flutter Increase in burden Recent cardioversion successful but early return atrial flutter Continue flecainide 50 mg bid for now but will likely have to stop this if another antiarrythmic is recommended by EP, as she is pending establishing with another EP in Dr. Jackalyn Lombard absence  We had discussed in the past amio vrs Tikosyn, she would prefer not to use amiodarone I think her age would not be favorable for repeat ablation  Continue apixaban 5 mg bid Continue Cardizem at  300 mg daily Use 30 mg cardizem as need if HR's are above 120 bpm  This patients CHA2DS2-VASc Score and unadjusted Ischemic Stroke Rate (% per year) is equal to 4.8 % stroke rate/year from a score of 4  Above score calculated as 1 point each if present [CHF, HTN, DM, Vascular=MI/PAD/Aortic Plaque, Age if 65-74, or Female] Above score calculated as 2 points each if present [Age > 75, or Stroke/TIA/TE]   Follow-up:f/u in afib  clinic one week after cardioversion   Current medicines are reviewed at length with the patient today.   The patient does not have concerns regarding her medicines.  The following changes were made today:  none  Labs/ tests ordered today include: pre procedure cbc/bmet/ covid screen Orders Placed This Encounter  Procedures   CBC   Basic metabolic panel   Magnesium   EKG 12-Lead     Signed, Geroge Baseman. Micael Barb, [facility] [address] [place], [place] [address] [phone]

## 2022-07-13 NOTE — Telephone Encounter (Signed)
Appt scheduled 11/20 with Dr Myles Gip.

## 2022-07-27 ENCOUNTER — Encounter: Payer: Self-pay | Admitting: Cardiovascular Disease

## 2022-07-27 ENCOUNTER — Telehealth: Payer: Self-pay | Admitting: Pharmacist

## 2022-07-27 ENCOUNTER — Ambulatory Visit: Payer: Medicare Other | Attending: Cardiovascular Disease | Admitting: Cardiovascular Disease

## 2022-07-27 VITALS — BP 116/70 | HR 95 | Ht 64.0 in | Wt 182.6 lb

## 2022-07-27 DIAGNOSIS — I480 Paroxysmal atrial fibrillation: Secondary | ICD-10-CM | POA: Diagnosis not present

## 2022-07-27 NOTE — Telephone Encounter (Signed)
Medication list reviewed in anticipation of upcoming Tikosyn initiation. Patient is not taking any contraindicated medications, however paroxetine can cause QTc prolongation. With currently normal Qtc, can continue for now and monitor during Tikosyn initiation.  Patient is anticoagulated on Eliquis on the appropriate dose. Please ensure that patient has not missed any anticoagulation doses in the 3 weeks prior to Tikosyn initiation.   Patient will need to be counseled to avoid use of Benadryl while on Tikosyn and in the 2-3 days prior to Tikosyn initiation.

## 2022-07-27 NOTE — Progress Notes (Signed)
Electrophysiology Office Note:    Date:  07/27/2022   ID:  Frances [name], DOB 02-Jul-1934, MRN [id number]  PCP:  Charlane Ferretti, MD   [place] [facility] Cardiologist:  [name], MD Electrophysiologist:  Melida Quitter, MD     Referring MD: Jerline Pain, MD   Chief complaint: fatigue  History of Present Illness:    Frances [name] is a 88 y.o. female with a hx of atrial fibrillation and atypical flutter.  Arrhythmia history per AF clinic notes: Pt with h/o of 2 prior ablation 2017 and most recent 09/2017 that has experienced persistent afib since last week. No known triggers. Her PCP recently increased cardizem for BP issues but she has not started. V rate 129 bpm today. She had several days of afib last May but converted on her own and DCCV was cancelled. Continues on eliquis with a CHA2DS2VASc score of 4.    F/u in afib clinic, 10/11/19, after successful cardioversion.  She remains in SR. She feels improved. Has noted mild ankle edema since increasing cardizem to 300 mg daily. Received her second covid shot this last week.    F/u in afib slinc 05/27/20. She went into persistent afib one week ago. No change in health,  although she does tell me she loss her husband in April. Her adult son with Parkinson's is living with her since being dx'ed and his wife asking him for a divorce. She has both vacccines and no missed anticoagulation.    F/u in afib clinic 07/08/20. She spontaneously reverted to SR so cardioversion was cancelled. She then had f/u with Dr. Marlou Porch  and was in afib. She was scheduled an appointment with Dr. Rayann Heman and he gave her several options but she wanted to revisit flecainide and was started on 50 mg bid. She is now in the office and is in SR with flecainide 50 mg bid.. She has a first degree block on flecainide similar  to EKG's on flecainide in the past. She feels well. No afib since start of flecainide.    F/u in afib clinic, 07/01/21. She   remains in SR. No issues with afib. Continues on flecainide 50 mg bid. No issues with Eliquis 5 mg bid for a CHA2DS2VASc  score of 5.    F/u afib clinic, 08/27/21. Pt was seen by Dr. Rayann Heman 10/25 and was found to be in atypical atrial flutter for around 2 weeks. She was set up for cardioversion. Her loop monitor was explanted at that visit. Cardioversion 08/20/21 was successful. She was told if ERAF then he would consider another ablation.    F/u in afib clinic, 02/17/22. She reports that she is doing well, no afib to report.    F/u in afib clinic 03/04/22 as she felt she went into afib a few days earlier. EKG today shows atrial flutter  at 110 bpm. Out of rhythm since last Tuesday. No know trigger.    Pt was seen back for  EKG prior to cardioversion early July as she spontaneously converted, DCCV cancelled.    She is now in the afib clinic, 10/23. She has been out of rhythm x 2 weeks. Despite one day of extra flecainide and taking 30 mg Cardizem as needed. She is in atypical atrial flutter  today with v rates around 100 bpm. We discussed change of antiarrythmic and would like  to  discuss elective admit for Tikosyn after she is cardioverted back to SR. She would prefer not to  use amiodarone. No missed anticoagulation for the last 21 days.    07/10/22. She is here to f/u successful cardioversion. Unfortunately, she has had return of arrhythmia. It appears to be an atypical atrial flutter at 106 bpm. We discussed use of Tikosyn in the past but she would refer to discuss with a  EP first, she is waiting to get an appointment to reestablish  with EP in Dr. Jackalyn Lombard absence to discuss.    She is here today to discuss management options. It has been 4 years since her last ablation which was performed when she was 42. At that time, there was some electrical activity in the left superior pulmonary vein, but the other veins remained quiescent. Dr. Rayann Heman also isolated the posterior wall.  Today, she denies  symptoms of palpitations, chest pain, shortness of breath, orthopnea, PND, lower extremity edema, claudication, dizziness, presyncope, syncope, bleeding, or neurologic sequela. She does have fatigue and less energy.  Past Medical History:  Diagnosis Date   Arthritis    hands, back   Asthmatic bronchitis    Atrial fibrillation ([place]) 07/31/2013   Chronic anticoagulation 07/30/2014   Chronic kidney disease    stage 3 renal disease - no med   Dizziness 07/30/2014   Dysrhythmia    Hx - a-fib 05/2010 - tx with meds, no problem since 05/2010   First degree AV block 07/30/2014   Hearing loss    bilateral hearing aids   Hypertension    controlled with meds   Hypothyroidism    Internal hemorrhoid    PAF (paroxysmal atrial fibrillation) ([place]) 11/28/2015   Paroxysmal atrial fibrillation ([facility])    Peripheral vascular disease ([place])    right arm and right shoulder blood clots r/t a fall   Persistent atrial fibrillation ([place]) 09/28/2017   Rectal bleeding    SVD (spontaneous vaginal delivery) 1957, 1959   x 2   Thyroid disease    hypothyroidism    Past Surgical History:  Procedure Laterality Date   ABDOMINAL HYSTERECTOMY     and right ovary   ANTERIOR AND POSTERIOR REPAIR N/A 01/03/2014   Procedure: ANTERIOR (CYSTOCELE) REPAIR;  Surgeon: Claiborne Billings A. Pamala Hurry, MD;  Location: [facility];  Service: Gynecology;  Laterality: N/A;   APPENDECTOMY     ATRIAL FIBRILLATION ABLATION N/A 09/28/2017   Procedure: ATRIAL FIBRILLATION ABLATION;  Surgeon: [name], MD;  Location: [facility];  Service: Cardiovascular;  Laterality: N/A;   BACK SURGERY     x 3 - 2 rods and 8 screws   CARDIOVERSION N/A 08/24/2017   Procedure: CARDIOVERSION;  Surgeon: Jerline Pain, MD;  Location: [facility];  Service: Cardiovascular;  Laterality: N/A;   CARDIOVERSION N/A 10/19/2017   Procedure: CARDIOVERSION;  Surgeon: Josue Hector, MD;  Location: [facility];  Service: Cardiovascular;  Laterality: N/A;    CARDIOVERSION N/A 10/04/2019   Procedure: CARDIOVERSION;  Surgeon: Pixie Casino, MD;  Location: [facility];  Service: Cardiovascular;  Laterality: N/A;   CARDIOVERSION N/A 08/20/2021   Procedure: CARDIOVERSION;  Surgeon: Jerline Pain, MD;  Location: [facility];  Service: Cardiovascular;  Laterality: N/A;   CARDIOVERSION N/A 07/02/2022   Procedure: CARDIOVERSION;  Surgeon: Geralynn Rile, MD;  Location: [facility];  Service: Cardiovascular;  Laterality: N/A;   COLONOSCOPY     CYSTOCELE REPAIR     DENTAL SURGERY     infected tooth - general   DILATION AND CURETTAGE OF UTERUS     x several   ELECTROPHYSIOLOGIC STUDY  N/A 11/28/2015   Procedure: Atrial Fibrillation Ablation;  Surgeon: [name], MD;  Location: [facility];  Service: Cardiovascular;  Laterality: N/A;   EP IMPLANTABLE DEVICE N/A 06/08/2016   Procedure: Loop Recorder Insertion;  Surgeon: [name], MD;  Location: [facility];  Service: Cardiovascular;  Laterality: N/A;   EXAMINATION UNDER ANESTHESIA N/A 01/15/2014   Procedure: EXAM UNDER ANESTHESIA with evacuation of hematoma and over sewing of vaginal mucosa.;  Surgeon: Floyce Stakes. Pamala Hurry, MD;  Location: [facility];  Service: Gynecology;  Laterality: N/A;   HAND SURGERY     left   hemorrhoid injection  04/2011   implantable loop recorder removal  08/06/2021   MDT Reveal LINQ removed by Dr Rayann Heman   JOINT REPLACEMENT     left thumb replacement   MYRINGOTOMY WITH TUBE PLACEMENT Right 10/27/2018   TEE WITHOUT CARDIOVERSION N/A 09/27/2017   Procedure: TRANSESOPHAGEAL ECHOCARDIOGRAM (TEE);  Surgeon: Sueanne Margarita, MD;  Location: [facility] END[facility]COPY;  Service: Cardiovascular;  Laterality: N/A;   TONSILLECTOMY      Current Medications: Current Meds  Medication Sig   acetaminophen (TYLENOL) 500 MG tablet Take 500-1,000 mg by mouth every 6 (six) hours as needed for moderate pain or headache.   apixaban (ELIQUIS) 5 MG TABS tablet TAKE 1 TABLET BY MOUTH  TWICE A DAY   Biotin (BIOTIN 5000) 5 MG CAPS Take 5 mg by mouth daily with lunch.   Calcium Carb-Cholecalciferol (CALCIUM 600 + D PO) Take 1 tablet by mouth daily with lunch. 600 mg / 20 mcg   diclofenac Sodium (VOLTAREN) 1 % GEL Apply 1 Application topically as needed (pain).   diltiazem (CARDIZEM CD) 300 MG 24 hr capsule Take 300 mg by mouth daily.   diltiazem (CARDIZEM) 30 MG tablet TAKE 1 TABLET BY MOUTH EVERY 4 HOURS AS NEEDED (FOR FAST HEART RATE > 100 AS LONG AS BP > 100).   famotidine-calcium carbonate-magnesium hydroxide (PEPCID COMPLETE) 10-800-165 MG chewable tablet Chew 1 tablet by mouth daily as needed (acid reflux).   levothyroxine (SYNTHROID, LEVOTHROID) 75 MCG tablet Take 75 mcg by mouth daily before breakfast.   PARoxetine (PAXIL) 30 MG tablet Take 30 mg by mouth daily.   Probiotic Product (PROBIOTIC PO) Take 1 capsule by mouth daily with lunch.   ramipril (ALTACE) 10 MG capsule Take 10 mg by mouth 2 (two) times daily.    rosuvastatin (CRESTOR) 10 MG tablet Take 1 tablet (10 mg total) by mouth daily.   vitamin B-12 (CYANOCOBALAMIN) 1000 MCG tablet Take 1,000 mcg by mouth daily with lunch.   Wheat Dextrin (BENEFIBER) POWD Take 1 Scoop by mouth daily.   [DISCONTINUED] flecainide (TAMBOCOR) 50 MG tablet TAKE 1 TABLET BY MOUTH TWICE A DAY     Allergies:   Pneumovax 23 [pneumococcal vac polyvalent], Codeine, Cephalexin, Ciprofloxacin, Sudafed [pseudoephedrine hcl], and Sulfa antibiotics   Social History   Socioeconomic History   Marital status: Married    Spouse name: Not on file   Number of children: Not on file   Years of education: Not on file   Highest education level: Not on file  Occupational History   Not on file  Tobacco Use   Smoking status: Never    Passive exposure: Past   Smokeless tobacco: Never   Tobacco comments:    Never smoke 07/10/22  Vaping Use   Vaping Use: Never used  Substance and Sexual Activity   Alcohol use: No   Drug use: No   Sexual  activity:  Not Currently    Birth control/protection: Post-menopausal  Other Topics Concern   Not on file  Social History Narrative   Not on file   Social Determinants of Health   Financial Resource Strain: Not on file  Food Insecurity: Not on file  Transportation Needs: Not on file  Physical Activity: Not on file  Stress: Not on file  Social Connections: Not on file     Family History: The patient's family history includes Diabetes in her father; Heart attack in her mother and sister; Heart disease in her brother, mother, and sister; Heart failure in her brother; Hypertension in her father and sister; Stroke in her father; Sudden death in her maternal grandfather.  ROS:   Please see the history of present illness.    All other systems reviewed and are negative.  EKGs/Labs/Other Studies Reviewed Today:     EKG:  Last EKG results: today - atrial flutter with variable block. Inferior infarct   Recent Labs: 06/23/2022: BUN 16; Creatinine, Ser 1.10; Hemoglobin 13.9; Magnesium 2.4; Platelets 243; Potassium 3.5; Sodium 139     Physical Exam:    VS:  BP 116/70   Pulse 95   Ht '5\' 4"'$  (1.626 m)   Wt 182 lb 9.6 oz (82.8 kg)   LMP  (LMP Unknown)   SpO2 97%   BMI 31.34 kg/m     Wt Readings from Last 3 Encounters:  07/27/22 182 lb 9.6 oz (82.8 kg)  07/10/22 181 lb 3.2 oz (82.2 kg)  07/02/22 176 lb (79.8 kg)     GEN: Well nourished, well developed in no acute distress CARDIAC: RRR, no murmurs, rubs, gallops RESPIRATORY:  Normal work of breathing MUSCULOSKELETAL: no edema    ASSESSMENT & PLAN:    Persistent atrial fibrillation: appears to be organizing into an atypical flutter recently Atypical atrial flutter: She is symptomatic with fatigue. At her age, and history of multiple ablations, I think it prudent to pursue rhythm control with medication. Will stop flecainide and try Tikosyn. Failing tikosyn, will try amiodarone. Secondary hypercoagulable state: continue  eliquis        Medication Adjustments/Labs and Tests Ordered: Current medicines are reviewed at length with the patient today.  Concerns regarding medicines are outlined above.  Orders Placed This Encounter  Procedures   EKG 12-Lead   No orders of the defined types were placed in this encounter.    Signed, Melida Quitter, MD  07/27/2022 11:03 AM    [place]

## 2022-07-27 NOTE — Patient Instructions (Addendum)
Medication Instructions:  Your physician has recommended you make the following change in your medication:  1) You will be started on Tikosyn at the hospital - see below for further instructions 2) STOP taking flecainide  *If you need a refill on your cardiac medications before your next appointment, please call your pharmacy*   Follow-Up: At [facility], you and your health needs are our priority.  As part of our continuing mission to provide you with exceptional heart care, we have created designated Provider Care Teams.  These Care Teams include your primary Cardiologist (physician) and Advanced Practice Providers (APPs -  Physician Assistants and Nurse Practitioners) who all work together to provide you with the care you need, when you need it.  Your next appointment:   The Atrial Fibrillation Clinic will be in contact with you to arrange your admission.   Other Instructions Tikosyn (Dofetilide) Hospital Admission   Prior to day of admission:  Check with drug insurance company for cost of drug to ensure affordability --- Dofetilide 500 mcg twice a day.  GoodRx is an option if insurance copay is unaffordable.    No Benadryl is allowed 3 days prior to admission.   Please ensure no missed doses of your anticoagulation (blood thinner) for 3 weeks prior to admission. If a dose is missed please notify our office immediately.   A pharmacist will review all your medications for potential interactions with Tikosyn. If any medication changes are needed prior to admission we will be in touch with you.   If any new medications are started AFTER your admission date is set with Nurse, adult. Please notify our office immediately so your medication list can be updated and reviewed by our pharmacist again.  On day of admission:  Tikosyn initiation requires a 3 night/4 day hospital stay with constant telemetry monitoring. You will have an EKG after each dose of Tikosyn as well as daily lab  draws.   If the drug does not convert you to normal rhythm a cardioversion after the 4th dose of Tikosyn.   Afib Clinic office visit on the morning of admission is needed for preliminary labs/ekg.   Time of admission is dependent on bed availability in the hospital. In some instances, you will be sent home until bed is available. Rarely admission can be delayed to the following day if hospital census prevents available beds.   You may bring personal belongings/clothing with you to the hospital. Please leave your suitcase in the car until you arrive in admissions.   Questions please call our office at [place]

## 2022-08-03 DIAGNOSIS — D6869 Other thrombophilia: Secondary | ICD-10-CM | POA: Diagnosis not present

## 2022-08-03 DIAGNOSIS — I700 Atherosclerosis of aorta: Secondary | ICD-10-CM | POA: Diagnosis not present

## 2022-08-03 DIAGNOSIS — N1831 Chronic kidney disease, stage 3a: Secondary | ICD-10-CM | POA: Diagnosis not present

## 2022-08-03 DIAGNOSIS — I129 Hypertensive chronic kidney disease with stage 1 through stage 4 chronic kidney disease, or unspecified chronic kidney disease: Secondary | ICD-10-CM | POA: Diagnosis not present

## 2022-08-03 DIAGNOSIS — E7800 Pure hypercholesterolemia, unspecified: Secondary | ICD-10-CM | POA: Diagnosis not present

## 2022-08-03 DIAGNOSIS — Z0000 Encounter for general adult medical examination without abnormal findings: Secondary | ICD-10-CM | POA: Diagnosis not present

## 2022-08-03 DIAGNOSIS — Z1331 Encounter for screening for depression: Secondary | ICD-10-CM | POA: Diagnosis not present

## 2022-08-03 DIAGNOSIS — E039 Hypothyroidism, unspecified: Secondary | ICD-10-CM | POA: Diagnosis not present

## 2022-08-03 DIAGNOSIS — Z79899 Other long term (current) drug therapy: Secondary | ICD-10-CM | POA: Diagnosis not present

## 2022-08-03 DIAGNOSIS — I480 Paroxysmal atrial fibrillation: Secondary | ICD-10-CM | POA: Diagnosis not present

## 2022-08-12 ENCOUNTER — Other Ambulatory Visit: Payer: Self-pay | Admitting: Internal Medicine

## 2022-08-12 DIAGNOSIS — Z1231 Encounter for screening mammogram for malignant neoplasm of breast: Secondary | ICD-10-CM

## 2022-08-14 ENCOUNTER — Encounter (HOSPITAL_COMMUNITY): Payer: Self-pay

## 2022-08-18 ENCOUNTER — Ambulatory Visit (HOSPITAL_COMMUNITY)
Admission: RE | Admit: 2022-08-18 | Discharge: 2022-08-18 | Disposition: A | Discharge status: Home / Self Care | Payer: Medicare Other | Source: Ambulatory Visit | Attending: Nurse Practitioner | Admitting: Nurse Practitioner

## 2022-08-18 VITALS — BP 148/86 | HR 84 | Ht 64.0 in | Wt 182.2 lb

## 2022-08-18 DIAGNOSIS — Z96698 Presence of other orthopedic joint implants: Secondary | ICD-10-CM | POA: Diagnosis present

## 2022-08-18 DIAGNOSIS — I4891 Unspecified atrial fibrillation: Secondary | ICD-10-CM | POA: Diagnosis not present

## 2022-08-18 DIAGNOSIS — I480 Paroxysmal atrial fibrillation: Secondary | ICD-10-CM

## 2022-08-18 DIAGNOSIS — Z888 Allergy status to other drugs, medicaments and biological substances status: Secondary | ICD-10-CM | POA: Diagnosis not present

## 2022-08-18 DIAGNOSIS — Z823 Family history of stroke: Secondary | ICD-10-CM | POA: Diagnosis not present

## 2022-08-18 DIAGNOSIS — Z79899 Other long term (current) drug therapy: Secondary | ICD-10-CM | POA: Diagnosis not present

## 2022-08-18 DIAGNOSIS — N1831 Chronic kidney disease, stage 3a: Secondary | ICD-10-CM | POA: Diagnosis not present

## 2022-08-18 DIAGNOSIS — I4819 Other persistent atrial fibrillation: Secondary | ICD-10-CM | POA: Diagnosis not present

## 2022-08-18 DIAGNOSIS — R490 Dysphonia: Secondary | ICD-10-CM | POA: Diagnosis not present

## 2022-08-18 DIAGNOSIS — H9190 Unspecified hearing loss, unspecified ear: Secondary | ICD-10-CM | POA: Diagnosis not present

## 2022-08-18 DIAGNOSIS — Z9622 Myringotomy tube(s) status: Secondary | ICD-10-CM | POA: Diagnosis not present

## 2022-08-18 DIAGNOSIS — Z7901 Long term (current) use of anticoagulants: Secondary | ICD-10-CM | POA: Diagnosis not present

## 2022-08-18 DIAGNOSIS — I129 Hypertensive chronic kidney disease with stage 1 through stage 4 chronic kidney disease, or unspecified chronic kidney disease: Secondary | ICD-10-CM | POA: Diagnosis not present

## 2022-08-18 DIAGNOSIS — I4892 Unspecified atrial flutter: Secondary | ICD-10-CM | POA: Diagnosis not present

## 2022-08-18 DIAGNOSIS — I484 Atypical atrial flutter: Secondary | ICD-10-CM

## 2022-08-18 DIAGNOSIS — E039 Hypothyroidism, unspecified: Secondary | ICD-10-CM | POA: Diagnosis not present

## 2022-08-18 DIAGNOSIS — Z833 Family history of diabetes mellitus: Secondary | ICD-10-CM | POA: Diagnosis not present

## 2022-08-18 DIAGNOSIS — Z974 Presence of external hearing-aid: Secondary | ICD-10-CM | POA: Diagnosis not present

## 2022-08-18 DIAGNOSIS — Z885 Allergy status to narcotic agent status: Secondary | ICD-10-CM | POA: Diagnosis not present

## 2022-08-18 DIAGNOSIS — Z882 Allergy status to sulfonamides status: Secondary | ICD-10-CM | POA: Diagnosis not present

## 2022-08-18 DIAGNOSIS — Z8249 Family history of ischemic heart disease and other diseases of the circulatory system: Secondary | ICD-10-CM | POA: Diagnosis not present

## 2022-08-18 DIAGNOSIS — I739 Peripheral vascular disease, unspecified: Secondary | ICD-10-CM | POA: Diagnosis not present

## 2022-08-18 LAB — BASIC METABOLIC PANEL
Anion gap: 13 (ref 5–15)
BUN: 14 mg/dL (ref 8–23)
CO2: 24 mmol/L (ref 22–32)
Calcium: 9.9 mg/dL (ref 8.9–10.3)
Chloride: 103 mmol/L (ref 98–111)
Creatinine, Ser: 0.87 mg/dL (ref 0.44–1.00)
GFR, Estimated: >60 mL/min (ref >60)
Glucose, Bld: 103 mg/dL — ABNORMAL HIGH (ref 70–99)
Potassium: 4.3 mmol/L (ref 3.5–5.1)
Sodium: 140 mmol/L (ref 135–145)

## 2022-08-18 LAB — MAGNESIUM: Magnesium: 2.3 mg/dL (ref 1.7–2.4)

## 2022-08-18 NOTE — H&P (Signed)
Electrophysiology H&P  Note       Date:  08/19/2022   ID:  Frances [name], DOB October 11, 1933, MRN [id number]  Location: Office  Provider location: [address] [place], [place] [address] Evaluation Performed: f/u persistent afib  PCP:  Thana Ates, MD  Primary Cardiologist:   Dr. Anne Fu  Primary Electrophysiologist: previous Dr. Johney Frame   CC: persistent afib     History of Present Illness: Frances [name] is a 88 y.o. female who presents  for persistent afib since last week.  Pt with h/o of 2 prior ablation 2017 and most recent 09/2017 that has experienced persistent afib since last week. No known triggers. Her PCP recently increased cardizem for BP issues but she has not started. V rate 129 bpm today. She had several days of afib last May but converted on her own and DCCV was cancelled. Continues on eliquis with a CHA2DS2VASc score of 4.   F/u in afib clinic, 10/11/19, after successful cardioversion.  She remains in SR. She feels improved. Has noted mild ankle edema since increasing cardizem to 300 mg daily. Received her second covid shot this last week.   F/u in afib slinc 05/27/20. She went into persistent afib one week ago. No change in health,  although she does tell me she loss her husband in April. Her adult son with Parkinson's is living with her since being dx'ed and his wife asking him for a divorce. She has both vacccines and no missed anticoagulation.   F/u in afib clinic 07/08/20. She spontaneously reverted to SR so cardioversion was cancelled. She then had f/u with Dr. Anne Fu  and was in afib. She was scheduled an appointment with Dr. Johney Frame and he gave her several options but she wanted to revisit flecainide and was started on 50 mg bid. She is now in the office and is in SR with flecainide 50 mg bid.. She has a first degree block on flecainide similar  to EKG's on flecainide in the past. She feels well. No afib since start of flecainide.   F/u in afib clinic,  07/01/21. She  remains in SR. No issues with afib. Continues on flecainide 50 mg bid. No issues with Eliquis 5 mg bid for a CHA2DS2VASc  score of 5.   F/u afib clinic, 08/27/21. Pt was seen by Dr. Johney Frame 10/25 and was found to be in atypical atrial flutter for around 2 weeks. She was set up for cardioversion. Her loop monitor was explanted at that visit. Cardioversion 08/20/21 was successful. She was told if ERAF then he would consider another ablation.   F/u in afib clinic, 02/17/22. She reports that she is doing well, no afib to report.   F/u in afib clinic 03/04/22 as she felt she went into afib a few days earlier. EKG today shows atrial flutter  at 110 bpm. Out of rhythm since last Tuesday. No know trigger.   Pt was seen back for  EKG prior to cardioversion early July as she spontaneously converted, DCCV cancelled.   She is now in the afib clinic, 10/23. She has been out of rhythm x 2 weeks. Despite one day of extra flecainide and taking 30 mg Cardizem as needed. She is in atypical atrial flutter  today with v rates around 100 bpm. We discussed change of antiarrythmic and would like  to  discuss elective admit for Tikosyn after she is cardioverted back to SR. She would prefer not to  use amiodarone. No  missed anticoagulation for the last 21 days.   07/10/22. She is here to f/u successful cardioversion. Unfortunately, she has had return of arrhythmia. It appears to be an atypical atrial flutter at 106 bpm. We discussed use of Tikosyn in the past but she would refer to discuss with a  EP first, she is waiting to get an appointment to reestablish  with EP in Dr. Jenel Lucks absence to discuss.  F/u in afib clinic, 08/18/22. She is here for Tikosyn admission. No missed anticoagulation. No benadryl use. Labs drawn. Qt c is 446. She is in rate controlled a flutter today. Price of drug discussed. Off flecainide  for several weeks.   12/12 seen in AF clinic. She denies symptoms of palpitations, chest pain,  shortness of breath, orthopnea, PND, lower extremity edema, claudication, dizziness, presyncope, syncope, bleeding, or neurologic sequela. The patient is tolerating medications without difficulties and is otherwise without complaint today.   she denies symptoms of cough, fevers, chills, or new SOB worrisome for COVID 19.   She presents today for Tikosyn load; delayed overnight due to bed availability.  No currently complaints.   Body mass index is 30.8 kg/m.    Past Medical History:  Diagnosis Date   Arthritis    hands, back   Asthmatic bronchitis    Atrial fibrillation (HCC) 07/31/2013   Chronic anticoagulation 07/30/2014   Chronic kidney disease    stage 3 renal disease - no med   Dizziness 07/30/2014   Dysrhythmia    Hx - a-fib 05/2010 - tx with meds, no problem since 05/2010   First degree AV block 07/30/2014   Hearing loss    bilateral hearing aids   Hypertension    controlled with meds   Hypothyroidism    Internal hemorrhoid    PAF (paroxysmal atrial fibrillation) (HCC) 11/28/2015   Paroxysmal atrial fibrillation (HCC)    Peripheral vascular disease (HCC)    right arm and right shoulder blood clots r/t a fall   Persistent atrial fibrillation (HCC) 09/28/2017   Rectal bleeding    SVD (spontaneous vaginal delivery) 1957, 1959   x 2   Thyroid disease    hypothyroidism   Past Surgical History:  Procedure Laterality Date   ABDOMINAL HYSTERECTOMY     and right ovary   ANTERIOR AND POSTERIOR REPAIR N/A 01/03/2014   Procedure: ANTERIOR (CYSTOCELE) REPAIR;  Surgeon: Tresa Endo A. Ernestina Penna, MD;  Location: [facility];  Service: Gynecology;  Laterality: N/A;   APPENDECTOMY     ATRIAL FIBRILLATION ABLATION N/A 09/28/2017   Procedure: ATRIAL FIBRILLATION ABLATION;  Surgeon: Hillis Range, MD;  Location: [facility] INVASIVE [facility] LAB;  Service: Cardiovascular;  Laterality: N/A;   BACK SURGERY     x 3 - 2 rods and 8 screws   CARDIOVERSION N/A 08/24/2017   Procedure: CARDIOVERSION;  Surgeon: Jake Bathe, MD;  Location: [facility] ENDOSCOPY;  Service: Cardiovascular;  Laterality: N/A;   CARDIOVERSION N/A 10/19/2017   Procedure: CARDIOVERSION;  Surgeon: Wendall Stade, MD;  Location: [facility] ENDOSCOPY;  Service: Cardiovascular;  Laterality: N/A;   CARDIOVERSION N/A 10/04/2019   Procedure: CARDIOVERSION;  Surgeon: Chrystie Nose, MD;  Location: [facility] ENDOSCOPY;  Service: Cardiovascular;  Laterality: N/A;   CARDIOVERSION N/A 08/20/2021   Procedure: CARDIOVERSION;  Surgeon: Jake Bathe, MD;  Location: [facility] ENDOSCOPY;  Service: Cardiovascular;  Laterality: N/A;   CARDIOVERSION N/A 07/02/2022   Procedure: CARDIOVERSION;  Surgeon: Sande Rives, MD;  Location: [facility] ENDOSCOPY;  Service: Cardiovascular;  Laterality: N/A;  COLONOSCOPY     CYSTOCELE REPAIR     DENTAL SURGERY     infected tooth - general   DILATION AND CURETTAGE OF UTERUS     x several   ELECTROPHYSIOLOGIC STUDY N/A 11/28/2015   Procedure: Atrial Fibrillation Ablation;  Surgeon: Hillis Range, MD;  Location: [facility] INVASIVE CV LAB;  Service: Cardiovascular;  Laterality: N/A;   EP IMPLANTABLE DEVICE N/A 06/08/2016   Procedure: Loop Recorder Insertion;  Surgeon: Hillis Range, MD;  Location: [facility] INVASIVE CV LAB;  Service: Cardiovascular;  Laterality: N/A;   EXAMINATION UNDER ANESTHESIA N/A 01/15/2014   Procedure: EXAM UNDER ANESTHESIA with evacuation of hematoma and over sewing of vaginal mucosa.;  Surgeon: Alphonsus Sias. Ernestina Penna, MD;  Location: [facility];  Service: Gynecology;  Laterality: N/A;   HAND SURGERY     left   hemorrhoid injection  04/2011   implantable loop recorder removal  08/06/2021   MDT Reveal LINQ removed by Dr Johney Frame   JOINT REPLACEMENT     left thumb replacement   MYRINGOTOMY WITH TUBE PLACEMENT Right 10/27/2018   TEE WITHOUT CARDIOVERSION N/A 09/27/2017   Procedure: TRANSESOPHAGEAL ECHOCARDIOGRAM (TEE);  Surgeon: Quintella Reichert, MD;  Location: [facility] ENDOSCOPY;  Service: Cardiovascular;  Laterality: N/A;   TONSILLECTOMY        No current facility-administered medications for this encounter.    Allergies:   Pneumovax 23 [pneumococcal vac polyvalent], Codeine, Cephalexin, Ciprofloxacin, Sudafed [pseudoephedrine hcl], and Sulfa antibiotics   Social History:  The patient  reports that she has never smoked. She has been exposed to tobacco smoke. She has never used smokeless tobacco. She reports that she does not drink alcohol and does not use drugs.   Family History:  The patient's  family history includes Diabetes in her father; Heart attack in her mother and sister; Heart disease in her brother, mother, and sister; Heart failure in her brother; Hypertension in her father and sister; Stroke in her father; Sudden death in her maternal grandfather.    ROS:  Please see the history of present illness.   All other systems are personally reviewed and negative.   Exam: GEN- The patient is well appearing, alert and oriented x 3 today.   Head- normocephalic, atraumatic Eyes-  Sclera clear, conjunctiva pink Ears- hearing intact Oropharynx- clear Neck- supple, no JVP Lymph- no cervical lymphadenopathy Lungs- Clear to ausculation bilaterally, normal work of breathing Heart- regular rate and rhythm, no murmurs, rubs or gallops, PMI not laterally displaced GI- soft, NT, ND, + BS Extremities- no clubbing, cyanosis, or edema MS- no significant deformity or atrophy Skin- no rash or lesion Psych- euthymic mood, full affect Neuro- strength and sensation are intact    GNF:AOZH. rate 84 BPM PR interval 276 ms QRS duration 78 ms QT/QTcB 378/446 ms P-R-T axes 83 -59 -56 Sinus rhythm with 1st degree A-V block Left axis deviation Pulmonary disease pattern Septal infarct , age undetermined ST & T wave abnormality, consider inferior ischemia Abnormal ECG When compared with ECG of 10-Jul-2022 11:17, PREVIOUS ECG IS PRESENT   ASSESSMENT AND PLAN:  1.Persisitent symptomatic atrial fibrillation/flutter Increase in  burden Recent cardioversion successful but early return atrial flutter We had discussed in the past amio vrs Tikosyn, she would prefer not to use amiodarone She is here today for tikosyn admission after discussing with Dr. Nelly Laurence  Continue apixaban 5 mg bid, states no missed doses She had been off flecainide greater for several weeks  Continue Cardizem at  300 mg daily Qt acceptable  No missed anticoagulation for at least 3 weeks  bmet/mag today General hospital procedure for Tikosyn admit reviewed  This patients CHA2DS2-VASc Score and unadjusted Ischemic Stroke Rate (% per year) is equal to 4.8 % stroke rate/year from a score of 4  Above score calculated as 1 point each if present [CHF, HTN, DM, Vascular=MI/PAD/Aortic Plaque, Age if 65-74, or Female] Above score calculated as 2 points each if present [Age > 75, or Stroke/TIA/TE]   Follow-up:f/u in afib clinic one week after discharge   Current medicines are reviewed at length with the patient today.   The patient does not have concerns regarding her medicines.  The following changes were made today:  none  Pt presents for Tikosyn admission without change to above. Delayed due to bed availability. She is aware earliest discharge availability will be Saturday am.   Doreatha Martin, [place]   08/19/2022 10:37 AM

## 2022-08-18 NOTE — Progress Notes (Signed)
Date:  08/18/2022   ID:  Frances [name], DOB Apr 04, 1934, MRN [id number]  Location: Office  Provider location: [place], [place] [address] Evaluation Performed: f/u persistent afib  PCP:  Charlane Ferretti, MD  Primary Cardiologist:   Dr. Marlou Porch  Primary Electrophysiologist: previous Dr. Rayann Heman   CC: persistent afib     History of Present Illness: Frances [name] is a 88 y.o. female who presents  for persistent afib since last week.  Pt with h/o of 2 prior ablation 2017 and most recent 09/2017 that has experienced persistent afib since last week. No known triggers. Her PCP recently increased cardizem for BP issues but she has not started. V rate 129 bpm today. She had several days of afib last May but converted on her own and DCCV was cancelled. Continues on eliquis with a CHA2DS2VASc score of 4.   F/u in afib clinic, 10/11/19, after successful cardioversion.  She remains in SR. She feels improved. Has noted mild ankle edema since increasing cardizem to 300 mg daily. Received her second covid shot this last week.   F/u in afib slinc 05/27/20. She went into persistent afib one week ago. No change in health,  although she does tell me she loss her husband in April. Her adult son with Parkinson's is living with her since being dx'ed and his wife asking him for a divorce. She has both vacccines and no missed anticoagulation.   F/u in afib clinic 07/08/20. She spontaneously reverted to SR so cardioversion was cancelled. She then had f/u with Dr. Marlou Porch  and was in afib. She was scheduled an appointment with Dr. Rayann Heman and he gave her several options but she wanted to revisit flecainide and was started on 50 mg bid. She is now in the office and is in SR with flecainide 50 mg bid.. She has a first degree block on flecainide similar  to EKG's on flecainide in the past. She feels well. No afib since start of flecainide.   F/u in afib clinic, 07/01/21. She  remains in SR. No  issues with afib. Continues on flecainide 50 mg bid. No issues with Eliquis 5 mg bid for a CHA2DS2VASc  score of 5.   F/u afib clinic, 08/27/21. Pt was seen by Dr. Rayann Heman 10/25 and was found to be in atypical atrial flutter for around 2 weeks. She was set up for cardioversion. Her loop monitor was explanted at that visit. Cardioversion 08/20/21 was successful. She was told if ERAF then he would consider another ablation.   F/u in afib clinic, 02/17/22. She reports that she is doing well, no afib to report.   F/u in afib clinic 03/04/22 as she felt she went into afib a few days earlier. EKG today shows atrial flutter  at 110 bpm. Out of rhythm since last Tuesday. No know trigger.   Pt was seen back for  EKG prior to cardioversion early July as she spontaneously converted, DCCV cancelled.   She is now in the afib clinic, 10/23. She has been out of rhythm x 2 weeks. Despite one day of extra flecainide and taking 30 mg Cardizem as needed. She is in atypical atrial flutter  today with v rates around 100 bpm. We discussed change of antiarrythmic and would like  to  discuss elective admit for Tikosyn after she is cardioverted back to SR. She would prefer not to  use amiodarone. No missed anticoagulation for the last 21  days.   07/10/22. She is here to f/u successful cardioversion. Unfortunately, she has had return of arrhythmia. It appears to be an atypical atrial flutter at 106 bpm. We discussed use of Tikosyn in the past but she would refer to discuss with a  EP first, she is waiting to get an appointment to reestablish  with EP in Dr. Jackalyn Lombard absence to discuss.  F/u in afib clinic, 08/18/22. She is here for Tikosyn admission. No missed anticoagulation. No benadryl use. Labs drawn. Qt c is 446. She is in rate controlled a flutter today. Price of drug discussed. Off flecainide  for several weeks.   Today, she denies symptoms of palpitations, chest pain, shortness of breath, orthopnea, PND, lower extremity  edema, claudication, dizziness, presyncope, syncope, bleeding, or neurologic sequela. The patient is tolerating medications without difficulties and is otherwise without complaint today.   she denies symptoms of cough, fevers, chills, or new SOB worrisome for COVID 19.    she has a BMI of Body mass index is 31.34 kg/m.Marland Kitchen There were no vitals filed for this visit.    Past Medical History:  Diagnosis Date   Arthritis    hands, back   Asthmatic bronchitis    Atrial fibrillation ([place]) 07/31/2013   Chronic anticoagulation 07/30/2014   Chronic kidney disease    stage 3 renal disease - no med   Dizziness 07/30/2014   Dysrhythmia    Hx - a-fib 05/2010 - tx with meds, no problem since 05/2010   First degree AV block 07/30/2014   Hearing loss    bilateral hearing aids   Hypertension    controlled with meds   Hypothyroidism    Internal hemorrhoid    PAF (paroxysmal atrial fibrillation) ([place]) 11/28/2015   Paroxysmal atrial fibrillation (HCC)    Peripheral vascular disease ([place])    right arm and right shoulder blood clots r/t a fall   Persistent atrial fibrillation ([place]) 09/28/2017   Rectal bleeding    SVD (spontaneous vaginal delivery) 1957, 1959   x 2   Thyroid disease    hypothyroidism   Past Surgical History:  Procedure Laterality Date   ABDOMINAL HYSTERECTOMY     and right ovary   ANTERIOR AND POSTERIOR REPAIR N/A 01/03/2014   Procedure: ANTERIOR (CYSTOCELE) REPAIR;  Surgeon: Claiborne Billings A. Pamala Hurry, MD;  Location: [place] [facility];  Service: Gynecology;  Laterality: N/A;   APPENDECTOMY     ATRIAL FIBRILLATION ABLATION N/A 09/28/2017   Procedure: ATRIAL FIBRILLATION ABLATION;  Surgeon: [name], MD;  Location: [place] CV LAB;  Service: Cardiovascular;  Laterality: N/A;   BACK SURGERY     x 3 - 2 rods and 8 screws   CARDIOVERSION N/A 08/24/2017   Procedure: CARDIOVERSION;  Surgeon: Jerline Pain, MD;  Location: [place];  Service: Cardiovascular;  Laterality: N/A;   CARDIOVERSION  N/A 10/19/2017   Procedure: CARDIOVERSION;  Surgeon: Josue Hector, MD;  Location: [facility] ENDOSCOPY;  Service: Cardiovascular;  Laterality: N/A;   CARDIOVERSION N/A 10/04/2019   Procedure: CARDIOVERSION;  Surgeon: Pixie Casino, MD;  Location: [place];  Service: Cardiovascular;  Laterality: N/A;   CARDIOVERSION N/A 08/20/2021   Procedure: CARDIOVERSION;  Surgeon: Jerline Pain, MD;  Location: [facility] ENDOSCOPY;  Service: Cardiovascular;  Laterality: N/A;   CARDIOVERSION N/A 07/02/2022   Procedure: CARDIOVERSION;  Surgeon: Geralynn Rile, MD;  Location: [place];  Service: Cardiovascular;  Laterality: N/A;   COLONOSCOPY     [place]  infected tooth - general   DILATION AND CURETTAGE OF UTERUS     x several   ELECTROPHYSIOLOGIC STUDY N/A 11/28/2015   Procedure: Atrial Fibrillation Ablation;  Surgeon: [name], MD;  Location: [place] CV LAB;  Service: Cardiovascular;  Laterality: N/A;   EP IMPLANTABLE DEVICE N/A 06/08/2016   Procedure: Loop Recorder Insertion;  Surgeon: [name], MD;  Location: [place] CV LAB;  Service: Cardiovascular;  Laterality: N/A;   EXAMINATION UNDER ANESTHESIA N/A 01/15/2014   Procedure: EXAM UNDER ANESTHESIA with evacuation of hematoma and over sewing of vaginal mucosa.;  Surgeon: Floyce Stakes. Pamala Hurry, MD;  Location: [place] [facility];  Service: Gynecology;  Laterality: N/A;   HAND SURGERY     left   hemorrhoid injection  04/2011   implantable loop recorder removal  08/06/2021   MDT Reveal LINQ removed by Dr Rayann Heman   JOINT REPLACEMENT     left thumb replacement   MYRINGOTOMY WITH TUBE PLACEMENT Right 10/27/2018   TEE WITHOUT CARDIOVERSION N/A 09/27/2017   Procedure: TRANSESOPHAGEAL ECHOCARDIOGRAM (TEE);  Surgeon: Sueanne Margarita, MD;  Location: [facility] ENDOSCOPY;  Service: Cardiovascular;  Laterality: N/A;   TONSILLECTOMY       Current Outpatient Medications  Medication Sig Dispense Refill   acetaminophen (TYLENOL) 500  MG tablet Take 500-1,000 mg by mouth every 6 (six) hours as needed for moderate pain or headache.     apixaban (ELIQUIS) 5 MG TABS tablet TAKE 1 TABLET BY MOUTH TWICE A DAY 180 tablet 1   Biotin (BIOTIN 5000) 5 MG CAPS Take 5 mg by mouth daily with lunch.     Calcium Carb-Cholecalciferol (CALCIUM 600 + D PO) Take 1 tablet by mouth daily with lunch. 600 mg / 20 mcg     diclofenac Sodium (VOLTAREN) 1 % GEL Apply 1 Application topically as needed (pain).     diltiazem (CARDIZEM CD) 300 MG 24 hr capsule Take 300 mg by mouth daily.     diltiazem (CARDIZEM) 30 MG tablet TAKE 1 TABLET BY MOUTH EVERY 4 HOURS AS NEEDED (FOR FAST HEART RATE > 100 AS LONG AS BP > 100). 135 tablet 1   famotidine-calcium carbonate-magnesium hydroxide (PEPCID COMPLETE) 10-800-165 MG chewable tablet Chew 1 tablet by mouth daily as needed (acid reflux).     levothyroxine (SYNTHROID, LEVOTHROID) 75 MCG tablet Take 75 mcg by mouth daily before breakfast.     PARoxetine (PAXIL) 30 MG tablet Take 30 mg by mouth daily.     Probiotic Product (PROBIOTIC PO) Take 1 capsule by mouth daily with lunch.     ramipril (ALTACE) 10 MG capsule Take 10 mg by mouth 2 (two) times daily.      rosuvastatin (CRESTOR) 10 MG tablet Take 1 tablet (10 mg total) by mouth daily. 30 tablet 5   vitamin B-12 (CYANOCOBALAMIN) 1000 MCG tablet Take 1,000 mcg by mouth daily with lunch.     Wheat Dextrin (BENEFIBER) POWD Take 1 Scoop by mouth daily.     No current facility-administered medications for this encounter.    Allergies:   Pneumovax 23 [pneumococcal vac polyvalent], Codeine, Cephalexin, Ciprofloxacin, Sudafed [pseudoephedrine hcl], and Sulfa antibiotics   Social History:  The patient  reports that she has never smoked. She has been exposed to tobacco smoke. She has never used smokeless tobacco. She reports that she does not drink alcohol and does not use drugs.   Family History:  The patient's  family history includes Diabetes in her father; Heart  attack in her mother and sister;  Heart disease in her brother, mother, and sister; Heart failure in her brother; Hypertension in her father and sister; Stroke in her father; Sudden death in her maternal grandfather.    ROS:  Please see the history of present illness.   All other systems are personally reviewed and negative.   Exam: GEN- The patient is well appearing, alert and oriented x 3 today.   Head- normocephalic, atraumatic Eyes-  Sclera clear, conjunctiva pink Ears- hearing intact Oropharynx- clear Neck- supple, no JVP Lymph- no cervical lymphadenopathy Lungs- Clear to ausculation bilaterally, normal work of breathing Heart- regular rate and rhythm, no murmurs, rubs or gallops, PMI not laterally displaced GI- soft, NT, ND, + BS Extremities- no clubbing, cyanosis, or edema MS- no significant deformity or atrophy Skin- no rash or lesion Psych- euthymic mood, full affect Neuro- strength and sensation are intact    NUU:VOZD. rate 84 BPM PR interval 276 ms QRS duration 78 ms QT/QTcB 378/446 ms P-R-T axes 83 -59 -56 Sinus rhythm with 1st degree A-V block Left axis deviation Pulmonary disease pattern Septal infarct , age undetermined ST & T wave abnormality, consider inferior ischemia Abnormal ECG When compared with ECG of 10-Jul-2022 11:17, PREVIOUS ECG IS PRESENT   ASSESSMENT AND PLAN:  1.Persisitent symptomatic atrial fibrillation/flutter Increase in burden Recent cardioversion successful but early return atrial flutter We had discussed in the past amio vrs Tikosyn, she would prefer not to use amiodarone She is here today for tikosyn admission after discussing with Dr. Myles Gip  Continue apixaban 5 mg bid, states no missed doses She had been off flecainide greater for several weeks  Continue Cardizem at  300 mg daily Qt acceptable  No missed anticoagulation for at least 3 weeks  bmet/mag today General hospital procedure for Tikosyn admit reviewed  This patients  CHA2DS2-VASc Score and unadjusted Ischemic Stroke Rate (% per year) is equal to 4.8 % stroke rate/year from a score of 4  Above score calculated as 1 point each if present [CHF, HTN, DM, Vascular=MI/PAD/Aortic Plaque, Age if 65-74, or Female] Above score calculated as 2 points each if present [Age > 75, or Stroke/TIA/TE]   Follow-up:f/u in afib clinic one week after discharge   Current medicines are reviewed at length with the patient today.   The patient does not have concerns regarding her medicines.  The following changes were made today:  none   To 6 E when bed available     Signed, Geroge Baseman. Kano Heckmann, [facility] [address] [place], [place] [address] [phone]

## 2022-08-19 ENCOUNTER — Other Ambulatory Visit: Payer: Self-pay

## 2022-08-19 ENCOUNTER — Other Ambulatory Visit (HOSPITAL_COMMUNITY): Payer: Self-pay

## 2022-08-19 ENCOUNTER — Inpatient Hospital Stay (HOSPITAL_COMMUNITY)
Admission: RE | Admit: 2022-08-19 | Discharge: 2022-08-22 | DRG: 310 | Disposition: A | Discharge status: Home / Self Care | Payer: Medicare Other | Source: Ambulatory Visit | Attending: Cardiovascular Disease | Admitting: Cardiovascular Disease

## 2022-08-19 ENCOUNTER — Encounter (HOSPITAL_COMMUNITY): Payer: Self-pay | Admitting: Cardiology

## 2022-08-19 DIAGNOSIS — H9190 Unspecified hearing loss, unspecified ear: Secondary | ICD-10-CM | POA: Diagnosis not present

## 2022-08-19 DIAGNOSIS — Z888 Allergy status to other drugs, medicaments and biological substances status: Secondary | ICD-10-CM

## 2022-08-19 DIAGNOSIS — N1831 Chronic kidney disease, stage 3a: Secondary | ICD-10-CM | POA: Diagnosis present

## 2022-08-19 DIAGNOSIS — I739 Peripheral vascular disease, unspecified: Secondary | ICD-10-CM | POA: Diagnosis present

## 2022-08-19 DIAGNOSIS — I4892 Unspecified atrial flutter: Secondary | ICD-10-CM | POA: Diagnosis not present

## 2022-08-19 DIAGNOSIS — Z96698 Presence of other orthopedic joint implants: Secondary | ICD-10-CM | POA: Diagnosis present

## 2022-08-19 DIAGNOSIS — I4819 Other persistent atrial fibrillation: Secondary | ICD-10-CM | POA: Diagnosis not present

## 2022-08-19 DIAGNOSIS — I129 Hypertensive chronic kidney disease with stage 1 through stage 4 chronic kidney disease, or unspecified chronic kidney disease: Secondary | ICD-10-CM | POA: Diagnosis present

## 2022-08-19 DIAGNOSIS — Z8249 Family history of ischemic heart disease and other diseases of the circulatory system: Secondary | ICD-10-CM

## 2022-08-19 DIAGNOSIS — I4891 Unspecified atrial fibrillation: Secondary | ICD-10-CM | POA: Diagnosis present

## 2022-08-19 DIAGNOSIS — Z833 Family history of diabetes mellitus: Secondary | ICD-10-CM | POA: Diagnosis not present

## 2022-08-19 DIAGNOSIS — Z823 Family history of stroke: Secondary | ICD-10-CM

## 2022-08-19 DIAGNOSIS — Z882 Allergy status to sulfonamides status: Secondary | ICD-10-CM | POA: Diagnosis not present

## 2022-08-19 DIAGNOSIS — Z7901 Long term (current) use of anticoagulants: Secondary | ICD-10-CM

## 2022-08-19 DIAGNOSIS — Z885 Allergy status to narcotic agent status: Secondary | ICD-10-CM

## 2022-08-19 DIAGNOSIS — Z974 Presence of external hearing-aid: Secondary | ICD-10-CM | POA: Diagnosis not present

## 2022-08-19 DIAGNOSIS — Z79899 Other long term (current) drug therapy: Secondary | ICD-10-CM | POA: Diagnosis not present

## 2022-08-19 DIAGNOSIS — E039 Hypothyroidism, unspecified: Secondary | ICD-10-CM | POA: Diagnosis present

## 2022-08-19 LAB — BASIC METABOLIC PANEL
Anion gap: 9 (ref 5–15)
BUN: 14 mg/dL (ref 8–23)
CO2: 26 mmol/L (ref 22–32)
Calcium: 9.8 mg/dL (ref 8.9–10.3)
Chloride: 104 mmol/L (ref 98–111)
Creatinine, Ser: 1.07 mg/dL — ABNORMAL HIGH (ref 0.44–1.00)
GFR, Estimated: 51 mL/min — ABNORMAL LOW (ref >60)
Glucose, Bld: 87 mg/dL (ref 70–99)
Potassium: 4.5 mmol/L (ref 3.5–5.1)
Sodium: 139 mmol/L (ref 135–145)

## 2022-08-19 LAB — MAGNESIUM: Magnesium: 2.2 mg/dL (ref 1.7–2.4)

## 2022-08-19 MED ORDER — APIXABAN 5 MG PO TABS
5.0000 mg | ORAL_TABLET | Freq: Two times a day (BID) | ORAL | Status: DC
  Administered 2022-08-19 – 2022-08-22 (×6): 5 mg via ORAL
  Filled 2022-08-19 (×6): qty 1

## 2022-08-19 MED ORDER — ROSUVASTATIN CALCIUM 5 MG PO TABS
10.0000 mg | ORAL_TABLET | Freq: Every day | ORAL | Status: DC
  Administered 2022-08-19 – 2022-08-22 (×4): 10 mg via ORAL
  Filled 2022-08-19 (×4): qty 2

## 2022-08-19 MED ORDER — DICLOFENAC SODIUM 1 % EX GEL: 1.0000 | CUTANEOUS | Status: DC | PRN

## 2022-08-19 MED ORDER — FAMOTIDINE 20 MG PO TABS: 10.0000 mg | ORAL_TABLET | Freq: Every day | ORAL | Status: DC | PRN

## 2022-08-19 MED ORDER — PAROXETINE HCL 30 MG PO TABS
30.0000 mg | ORAL_TABLET | Freq: Every day | ORAL | Status: DC
  Administered 2022-08-20 – 2022-08-22 (×3): 30 mg via ORAL
  Filled 2022-08-19 (×3): qty 1

## 2022-08-19 MED ORDER — LEVOTHYROXINE SODIUM 75 MCG PO TABS
75.0000 ug | ORAL_TABLET | Freq: Every day | ORAL | Status: DC
  Administered 2022-08-20 – 2022-08-22 (×3): 75 ug via ORAL
  Filled 2022-08-19 (×3): qty 1

## 2022-08-19 MED ORDER — SODIUM CHLORIDE 0.9% FLUSH
3.0000 mL | Freq: Two times a day (BID) | INTRAVENOUS | Status: DC
  Administered 2022-08-19 – 2022-08-22 (×6): 3 mL via INTRAVENOUS

## 2022-08-19 MED ORDER — DOFETILIDE 250 MCG PO CAPS
250.0000 ug | ORAL_CAPSULE | Freq: Two times a day (BID) | ORAL | Status: DC
  Administered 2022-08-19 – 2022-08-20 (×3): 250 ug via ORAL
  Filled 2022-08-19 (×3): qty 1

## 2022-08-19 MED ORDER — ACETAMINOPHEN 500 MG PO TABS: 500.0000 mg | ORAL_TABLET | Freq: Four times a day (QID) | ORAL | Status: DC | PRN

## 2022-08-19 MED ORDER — MAGNESIUM HYDROXIDE 400 MG/5ML PO SUSP: 5.0000 mL | Freq: Every day | ORAL | Status: DC | PRN

## 2022-08-19 MED ORDER — CALCIUM CARBONATE ANTACID 500 MG PO CHEW: 800.0000 mg | CHEWABLE_TABLET | Freq: Every day | ORAL | Status: DC | PRN

## 2022-08-19 MED ORDER — SODIUM CHLORIDE 0.9 % IV SOLN: 250.0000 mL | INTRAVENOUS | Status: DC | PRN

## 2022-08-19 MED ORDER — DOFETILIDE 500 MCG PO CAPS: 500.0000 ug | ORAL_CAPSULE | Freq: Two times a day (BID) | ORAL | Status: DC

## 2022-08-19 MED ORDER — FAMOTIDINE-CA CARB-MAG HYDROX 10-800-165 MG PO CHEW: 1.0000 | CHEWABLE_TABLET | Freq: Every day | ORAL | Status: DC | PRN

## 2022-08-19 MED ORDER — SODIUM CHLORIDE 0.9% FLUSH: 3.0000 mL | INTRAVENOUS | Status: DC | PRN

## 2022-08-19 MED ORDER — DILTIAZEM HCL ER COATED BEADS 180 MG PO CP24
300.0000 mg | ORAL_CAPSULE | Freq: Every day | ORAL | Status: DC
  Administered 2022-08-20 – 2022-08-22 (×3): 300 mg via ORAL
  Filled 2022-08-19 (×3): qty 1

## 2022-08-19 NOTE — Progress Notes (Addendum)
Pharmacy: Dofetilide (Tikosyn) - Initial Consult Assessment and Electrolyte Replacement  Pharmacy consulted to assist in monitoring and replacing electrolytes in this 88 y.o. female admitted on 08/19/2022 undergoing dofetilide initiation. First dofetilide dose: 12/13'@2000'$  planned  Assessment:  Patient Exclusion Criteria: If any screening criteria checked as "Yes", then  patient  should NOT receive dofetilide until criteria item is corrected.  If "Yes" please indicate correction plan.  YES  NO Patient  Exclusion Criteria Correction Plan   '[x]'$   '[]'$   Baseline QTc interval is greater than or equal to 440 msec. IF above YES box checked dofetilide contraindicated unless patient has ICD; then may proceed if QTc 500-550 msec or with known ventricular conduction abnormalities may proceed with QTc 550-600 msec. QTc = 446 Okay to proceed    '[]'$   '[x]'$   Patient is known or suspected to have a digoxin level greater than 2 ng/ml: No results found for: "DIGOXIN"     '[]'$   '[x]'$   Creatinine clearance less than 20 ml/min (calculated using Cockcroft-Gault, actual body weight and serum creatinine): Estimated Creatinine Clearance: 47.9 mL/min (by C-G formula based on SCr of 0.87 mg/dL).     '[]'$   '[x]'$  Patient has received drugs known to prolong the QT intervals within the last 48 hours (phenothiazines, tricyclics or tetracyclic antidepressants, erythromycin, H-1 antihistamines, cisapride, fluoroquinolones, azithromycin, ondansetron).   Updated information on QT prolonging agents is available to be searched on the following database:QT prolonging agents  Paroxetine has a conditional risk for Qtc prolongation - should be okay to continue and monitor Qtc closely    '[]'$   '[x]'$   Patient received a dose of hydrochlorothiazide (Oretic) alone or in any combination including triamterene (Dyazide, Maxzide) in the last 48 hours.    '[]'$   '[x]'$  Patient received a medication known to increase dofetilide plasma concentrations  prior to initial dofetilide dose:  Trimethoprim (Primsol, Proloprim) in the last 36 hours Verapamil (Calan, Verelan) in the last 36 hours or a sustained release dose in the last 72 hours Megestrol (Megace) in the last 5 days  Cimetidine (Tagamet) in the last 6 hours Ketoconazole (Nizoral) in the last 24 hours Itraconazole (Sporanox) in the last 48 hours  Prochlorperazine (Compazine) in the last 36 hours     '[]'$   '[x]'$   Patient is known to have a history of torsades de pointes; congenital or acquired long QT syndromes.    '[]'$   '[x]'$   Patient has received a Class 1 antiarrhythmic with less than 2 half-lives since last dose. (Disopyramide, Quinidine, Procainamide, Lidocaine, Mexiletine, Flecainide, Propafenone)    '[]'$   '[x]'$   Patient has received amiodarone therapy in the past 3 months or amiodarone level is greater than 0.3 ng/ml.    Labs:    Component Value Date/Time   K 4.3 08/18/2022 1105   MG 2.3 08/18/2022 1105     Plan: Select One Calculated CrCl  Dose q12h  '[]'$  > 60 ml/min 500 mcg  '[x]'$  40-60 ml/min 250 mcg  '[]'$  20-40 ml/min 125 mcg   '[x]'$   Physician selected initial dose within range recommended for patients level of renal function - will monitor for response.  '[]'$   Physician selected initial dose outside of range recommended for patients level of renal function - will discuss if the dose should be altered at this time.   Patient has been appropriately anticoagulated with apixaban.  Potassium: K >/= 4: Appropriate to initiate Tikosyn, no replacement needed    Magnesium: Mg >2: Appropriate to initiate Tikosyn, no replacement needed  Thank you for allowing pharmacy to participate in this patient's care   Antonietta Jewel, PharmD, [place] Pharmacist  Phone: [phone] 08/19/2022 12:14 PM  Please check AMION for all [place] phone numbers After 10:00 PM, call [place] [phone]

## 2022-08-19 NOTE — Plan of Care (Signed)

## 2022-08-19 NOTE — Progress Notes (Signed)
Electrophysiology Rounding Note  Patient Name: Frances [name] Date of Encounter: 08/20/2022   Primary Cardiologist: [name], MD  Electrophysiologist: Melida Quitter, MD    Subjective   Pt  remains in flutter  on Tikosyn 250 mcg BID   QTc from EKG last pm shows  borderline  at ~490, corrects to ~ 450 by Fridericia.   The patient is doing well today.  At this time, the patient denies chest pain, shortness of breath, or any new concerns.  Inpatient Medications    Scheduled Meds:  apixaban  5 mg Oral BID   diltiazem  300 mg Oral Daily   dofetilide  250 mcg Oral BID   levothyroxine  75 mcg Oral QAC breakfast   PARoxetine  30 mg Oral Daily   rosuvastatin  10 mg Oral Daily   sodium chloride flush  3 mL Intravenous Q12H   Continuous Infusions:  sodium chloride     PRN Meds: sodium chloride, acetaminophen, famotidine **AND** calcium carbonate **AND** magnesium hydroxide, diclofenac Sodium, sodium chloride flush   Vital Signs    Vitals:   08/19/22 1956 08/19/22 2358 08/20/22 0433 08/20/22 0809  BP: (!) 136/92 (!) 134/95 (!) 129/97 (!) 142/90  Pulse:  (!) 120 (!) 120 (!) 124  Resp: '20 20 19 18  '$ Temp: 98.7 F (37.1 C) 97.8 F (36.6 C) 97.8 F (36.6 C) 98.1 F (36.7 C)  TempSrc: Oral Oral Oral Oral  SpO2: 96% 95% 95% 97%  Weight:      Height:        Intake/Output Summary (Last 24 hours) at 08/20/2022 0821 Last data filed at 08/19/2022 1300 Gross per 24 hour  Intake 480 ml  Output --  Net 480 ml   Filed Weights   08/19/22 1035  Weight: 81.4 kg    Physical Exam    GEN- The patient is well appearing, alert and oriented x 3 today.   Head- normocephalic, atraumatic Eyes-  Sclera clear, conjunctiva pink Ears- hearing intact Oropharynx- clear Neck- supple Lungs- Clear to ausculation bilaterally, normal work of breathing Heart- Irregularly irregular rate and rhythm, no murmurs, rubs or gallops GI- soft, NT, ND, + BS Extremities- no clubbing,  cyanosis, or edema Skin- no rash or lesion Psych- euthymic mood, full affect Neuro- strength and sensation are intact  Labs    CBC No results for input(s): "WBC", "NEUTROABS", "HGB", "HCT", "MCV", "PLT" in the last 72 hours. Basic Metabolic Panel Recent Labs    08/19/22 1112 08/20/22 0156  NA 139 142  K 4.5 4.3  CL 104 105  CO2 26 28  GLUCOSE 87 107*  BUN 14 15  CREATININE 1.07* 1.10*  CALCIUM 9.8 9.3  MG 2.2 2.2    Potassium  Date/Time Value Ref Range Status  08/20/2022 01:56 AM 4.3 3.5 - 5.1 mmol/L Final   Magnesium  Date/Time Value Ref Range Status  08/20/2022 01:56 AM 2.2 1.7 - 2.4 mg/dL Final    Comment:    Performed at [facility], [place] [address] [place], [place] [address]    Telemetry    AFL 100-120s (personally reviewed)  Radiology    No results found.   Patient Profile     Frances [name] is a 88 y.o. female with a past medical history significant for persistent atrial fibrillation.  They were admitted for tikosyn load.   Assessment & Plan    Persistent atrial fibrillation   Atrial flutter Pt  remains in fib/flutter  on Tikosyn  250 mcg BID  Continue Eliquis Creatinine, ser  1.10* (12/14 0156) Magnesium  2.2 (12/14 0156) Potassium4.3 (12/14 0156) No electrolyte supplementation needed  If pt does not convert chemically, plan on DCCV Friday    For questions or updates, please contact [place] Please consult [email or web address] for contact info under Cardiology/STEMI.  Signed, Shirley Friar, PA-C  08/20/2022, 8:21 AM

## 2022-08-19 NOTE — Care Management (Addendum)
  Transition of Care [facility]) Screening Note   Patient Details  Name: [name] Date of Birth: [birth year]   Transition of Care [facility]) CM/SW Contact:    Bethena Roys, RN Phone Number: 08/19/2022, 2:10 PM    Transition of Care Department [facility]) has reviewed the patient. Patient presented for Tikosyn Load. Benefits check completed for cost- $96.29. Case Manager will discuss cost with patient and pharmacy of choice as the patient progresses.   [place] spoke with patient and she wants her initial Rx sent to [facility]. Patient states she wants to speak with Minette Brine, who aides her in insurance regarding which insurance is best for medication cost. Patient then states she will contact Butch Penny in the [facility] regarding where she wants her refills to be sent. Case Manager assisted patient in adding the Good Rx to smart phone. Patient looked at [facility] and [facility]. No further needs identified.

## 2022-08-19 NOTE — TOC Benefit Eligibility Note (Signed)
Patient Teacher, English as a foreign language completed.    The patient is currently admitted and upon discharge could be taking dofetilide (Tikosyn) 250 mcg capsules.  The current 30 day co-pay is $96.29 due to being in Coverage Gap (donut hole).   The patient is insured through [employer], [place] Patient Advocate Specialist [place] Patient Advocate Team Direct Number: [phone]  Fax: [phone]

## 2022-08-20 DIAGNOSIS — I4892 Unspecified atrial flutter: Secondary | ICD-10-CM | POA: Diagnosis not present

## 2022-08-20 DIAGNOSIS — I4891 Unspecified atrial fibrillation: Secondary | ICD-10-CM | POA: Diagnosis not present

## 2022-08-20 LAB — BASIC METABOLIC PANEL
Anion gap: 9 (ref 5–15)
BUN: 15 mg/dL (ref 8–23)
CO2: 28 mmol/L (ref 22–32)
Calcium: 9.3 mg/dL (ref 8.9–10.3)
Chloride: 105 mmol/L (ref 98–111)
Creatinine, Ser: 1.1 mg/dL — ABNORMAL HIGH (ref 0.44–1.00)
GFR, Estimated: 49 mL/min — ABNORMAL LOW (ref >60)
Glucose, Bld: 107 mg/dL — ABNORMAL HIGH (ref 70–99)
Potassium: 4.3 mmol/L (ref 3.5–5.1)
Sodium: 142 mmol/L (ref 135–145)

## 2022-08-20 LAB — PROTIME-INR
INR: 1.3 — ABNORMAL HIGH (ref 0.8–1.2)
Prothrombin Time: 16.2 seconds — ABNORMAL HIGH (ref 11.4–15.2)

## 2022-08-20 LAB — MAGNESIUM: Magnesium: 2.2 mg/dL (ref 1.7–2.4)

## 2022-08-20 MED ORDER — RAMIPRIL 5 MG PO CAPS
5.0000 mg | ORAL_CAPSULE | Freq: Two times a day (BID) | ORAL | Status: DC
  Administered 2022-08-20 – 2022-08-22 (×5): 5 mg via ORAL
  Filled 2022-08-20 (×6): qty 1

## 2022-08-20 MED ORDER — SODIUM CHLORIDE 0.9 % IV SOLN: INTRAVENOUS | Status: DC

## 2022-08-20 NOTE — Progress Notes (Signed)
Morning EKG reviewed     Shows pt remains in flutter at 120 bpm with borderline QTc at ~480 ms after corrected for rate using Fridericia  Continue  Tikosyn 250 mcg BID.   Potassium4.3 (12/14 0156) Magnesium  2.2 (12/14 0156) Creatinine, ser  1.10* (12/14 0156)  Pt will be NPO after midnight for DCCV if remains in [place], PA-C  08/20/2022 11:39 AM

## 2022-08-20 NOTE — Progress Notes (Signed)
Notified by nursing that pt's last BP 157/97. Has been hypertensive since admission from diastolic standpoint but systolics now creeping up. Patient was on ramipril '10mg'$  BID as outpatient but EP team ordered lower dose of '5mg'$  BID for this admission. I do not see any mention of the dose change but patient is noted to be on lower dose Tikosyn in setting of borderline QTc therefore I am hesitant to increase dose back to '10mg'$  BID in case the change was intentional due to renal clearance concerns. Instructed nurse that we can go ahead and give the PM dose of '5mg'$  of ramipril early and trend BPs, with EP team to review BP med plan in AM. Nurse aware to notify if BP continues to uptrend.

## 2022-08-20 NOTE — Progress Notes (Signed)
Pharmacy: Dofetilide (Tikosyn) - Follow Up Assessment and Electrolyte Replacement  Pharmacy consulted to assist in monitoring and replacing electrolytes in this 88 y.o. female admitted on 08/19/2022 undergoing dofetilide initiation. First dofetilide dose: 12/13'@2000'$ .  Labs:    Component Value Date/Time   K 4.3 08/20/2022 0156   MG 2.2 08/20/2022 0156     Plan: Potassium: K >/= 4: No additional supplementation needed  Magnesium: Mg > 2: No additional supplementation needed   Thank you for allowing pharmacy to participate in this patient's care   Antonietta Jewel, PharmD, [place] Pharmacist  Phone: [phone] 08/20/2022 7:28 AM  Please check AMION for all [place] phone numbers After 10:00 PM, call [place] [phone]

## 2022-08-20 NOTE — Care Management (Signed)
1248 08-20-22 Patient will be discharged on Saturday. Case Manager called [facility] and they have one bottle of Tikosyn 250 mcg 60 capsules available. Pharmacy is unable to hold the medication for the patient; staff may need to call [facility] Saturday morning to make sure medication is available. If the medication is not available, Rx may need to be sent to [facility].

## 2022-08-20 NOTE — Progress Notes (Signed)
   08/20/22 0809  Assess: MEWS Score  Temp 98.1 F (36.7 C)  BP (!) 142/90  MAP (mmHg) 105  Pulse Rate (!) 124  ECG Heart Rate (!) 124  Resp 18  SpO2 97 %  O2 Device Room Air  Assess: MEWS Score  MEWS Temp 0  MEWS Systolic 0  MEWS Pulse 2  MEWS RR 0  MEWS LOC 0  MEWS Score 2  MEWS Score Color Yellow  Assess: if the MEWS score is Yellow or Red  Were vital signs taken at a resting state? Yes  Focused Assessment No change from prior assessment  Does the patient meet 2 or more of the SIRS criteria? No  Does the patient have a confirmed or suspected source of infection? No  Provider and Rapid Response Notified? No (MD aware)  MEWS guidelines implemented *See Row Information* Yes  Escalate  MEWS: Escalate Yellow: discuss with charge nurse/RN and consider discussing with provider and RRT  Notify: Charge Nurse/RN  Name of Charge Nurse/RN Notified Chanz Cahall  Date Charge Nurse/RN Notified 04/16/22  Time Charge Nurse/RN Notified 0810  Document  Patient Outcome Stabilized after interventions  Progress note created (see row info) Yes  Assess: SIRS CRITERIA  SIRS Temperature  0  SIRS Pulse 1  SIRS Respirations  0  SIRS WBC 0  SIRS Score Sum  1

## 2022-08-21 ENCOUNTER — Encounter (HOSPITAL_COMMUNITY)
Admission: RE | Disposition: A | Discharge status: Home / Self Care | Payer: Self-pay | Source: Ambulatory Visit | Attending: Cardiovascular Disease

## 2022-08-21 DIAGNOSIS — I4892 Unspecified atrial flutter: Secondary | ICD-10-CM | POA: Diagnosis not present

## 2022-08-21 DIAGNOSIS — I4891 Unspecified atrial fibrillation: Secondary | ICD-10-CM | POA: Diagnosis not present

## 2022-08-21 LAB — BASIC METABOLIC PANEL
Anion gap: 6 (ref 5–15)
BUN: 16 mg/dL (ref 8–23)
CO2: 28 mmol/L (ref 22–32)
Calcium: 9.2 mg/dL (ref 8.9–10.3)
Chloride: 107 mmol/L (ref 98–111)
Creatinine, Ser: 1.22 mg/dL — ABNORMAL HIGH (ref 0.44–1.00)
GFR, Estimated: 43 mL/min — ABNORMAL LOW (ref >60)
Glucose, Bld: 101 mg/dL — ABNORMAL HIGH (ref 70–99)
Potassium: 4.5 mmol/L (ref 3.5–5.1)
Sodium: 141 mmol/L (ref 135–145)

## 2022-08-21 LAB — MAGNESIUM: Magnesium: 2.4 mg/dL (ref 1.7–2.4)

## 2022-08-21 SURGERY — CARDIOVERSION
Anesthesia: General

## 2022-08-21 MED ORDER — DOFETILIDE 125 MCG PO CAPS
125.0000 ug | ORAL_CAPSULE | Freq: Two times a day (BID) | ORAL | Status: DC
  Administered 2022-08-21 (×2): 125 ug via ORAL
  Filled 2022-08-21 (×3): qty 1

## 2022-08-21 NOTE — Progress Notes (Signed)
Morning EKG reviewed     Shows remains in NSR at 79 bpm with  improved  QTc at ~480 ms.  Continue  Tikosyn 125 mcg BID.   Potassium4.5 (12/15 0211) Magnesium  2.4 (12/15 0211) Creatinine, ser  1.22* (12/15 0211)  Plan for home tomorrow, Saturday if QTc remains stable   Shirley Friar, [place]  08/21/2022 10:48 AM

## 2022-08-21 NOTE — Progress Notes (Signed)
Electrophysiology Rounding Note  Patient Name: Frances [name] Date of Encounter: 08/21/2022  Primary Cardiologist: [name], MD  Electrophysiologist: Melida Quitter, MD    Subjective   Pt converted to sinus rhythm on Tikosyn 250 mcg BID   QTc from EKG last pm shows prolonged QTc at ~500  The patient is doing well today.  At this time, the patient denies chest pain, shortness of breath, or any new concerns.  Inpatient Medications    Scheduled Meds:  apixaban  5 mg Oral BID   diltiazem  300 mg Oral Daily   dofetilide  125 mcg Oral BID   levothyroxine  75 mcg Oral QAC breakfast   PARoxetine  30 mg Oral Daily   ramipril  5 mg Oral BID   rosuvastatin  10 mg Oral Daily   sodium chloride flush  3 mL Intravenous Q12H   Continuous Infusions:  sodium chloride     sodium chloride     PRN Meds: sodium chloride, acetaminophen, famotidine **AND** calcium carbonate **AND** magnesium hydroxide, diclofenac Sodium, sodium chloride flush   Vital Signs    Vitals:   08/20/22 1705 08/20/22 2007 08/21/22 0016 08/21/22 0450  BP: (!) 157/102 136/80 131/82 120/86  Pulse: (!) 104 77 98 80  Resp: '20 20 16 16  '$ Temp: 98.2 F (36.8 C) (!) 97.5 F (36.4 C) 97.9 F (36.6 C) (!) 97.5 F (36.4 C)  TempSrc: Oral Oral Oral Oral  SpO2: 96% 96%    Weight:      Height:        Intake/Output Summary (Last 24 hours) at 08/21/2022 0728 Last data filed at 08/20/2022 2057 Gross per 24 hour  Intake 363 ml  Output --  Net 363 ml   Filed Weights   08/19/22 1035  Weight: 81.4 kg    Physical Exam    GEN- The patient is well appearing, alert and oriented x 3 today.   Head- normocephalic, atraumatic Eyes-  Sclera clear, conjunctiva pink Ears- hearing intact Oropharynx- clear Neck- supple Lungs- Clear to ausculation bilaterally, normal work of breathing Heart- Regular rate and rhythm, no murmurs, rubs or gallops GI- soft, NT, ND, + BS Extremities- no clubbing, cyanosis, or  edema Skin- no rash or lesion Psych- euthymic mood, full affect Neuro- strength and sensation are intact  Labs    CBC No results for input(s): "WBC", "NEUTROABS", "HGB", "HCT", "MCV", "PLT" in the last 72 hours. Basic Metabolic Panel Recent Labs    08/20/22 0156 08/21/22 0211  NA 142 141  K 4.3 4.5  CL 105 107  CO2 28 28  GLUCOSE 107* 101*  BUN 15 16  CREATININE 1.10* 1.22*  CALCIUM 9.3 9.2  MG 2.2 2.4    Potassium  Date/Time Value Ref Range Status  08/21/2022 02:11 AM 4.5 3.5 - 5.1 mmol/L Final   Magnesium  Date/Time Value Ref Range Status  08/21/2022 02:11 AM 2.4 1.7 - 2.4 mg/dL Final    Comment:    Performed at [facility], [place] [address], [place], [place] [address]    Telemetry    NSR 70-90s this am (personally reviewed)  Radiology    No results found.   Patient Profile     Frances [name] is a 88 y.o. female with a past medical history significant for persistent atrial fibrillation.  They were admitted for tikosyn load.   Assessment & Plan    Persistent atrial fibrillation Pt converted to sinus rhythm on Tikosyn 250 mcg BID;  her QT has prolonged. Decrease to 125 mcg BID Continue Eliquis Creatinine, ser  1.22* (12/15 0211) Magnesium  2.4 (12/15 0211) Potassium4.5 (12/15 0211) No electrolyte supplementation needed  Plan for home tomorrow, saturday if QTc remains stable.  2. HTN Back on her ramipril at decreased dose.  Follow.   For questions or updates, please contact [place] Please consult [email or web address] for contact info under Cardiology/STEMI.  Signed, Shirley Friar, PA-C  08/21/2022, 7:28 AM

## 2022-08-21 NOTE — Care Management Important Message (Signed)
Important Message  Patient Details  Name: Frances [name] MRN: [id number] Date of Birth: Jul 24, 1934   Medicare Important Message Given:  Yes     Shelda Altes 08/21/2022, 9:31 AM

## 2022-08-21 NOTE — Progress Notes (Signed)
Mobility Specialist - Progress Note   08/21/22 1601  Mobility  Activity Ambulated independently in hallway  Level of Assistance Independent  Assistive Device None  Distance Ambulated (ft) 360 ft  Activity Response Tolerated well  Mobility Referral No  $Mobility charge 1 Mobility   Pt received at nurses station and agreeable. No complaints throughout. Pt returned to room with all needs met.   Franki Monte  Mobility Specialist Please contact via Solicitor or Rehab office at [phone]

## 2022-08-21 NOTE — Progress Notes (Signed)
Called to confirm local pharmacies have Tikosyn 125 in stock.   [facility] reports that as of today they do have 125 mcg strength available, but limited, so may need to check again prior to sending in the am. They are open until 430 pm tomorrow, Saturday  [facility] [address], [place], [place] [address]    Does  have 125 mcg in stock as well.  They are open until 7 pm tomorrow, Saturday  Beryle Beams" [place], [place]  08/21/2022 5:00 PM

## 2022-08-21 NOTE — Progress Notes (Signed)
Pharmacy: Dofetilide (Tikosyn) - Follow Up Assessment and Electrolyte Replacement  Pharmacy consulted to assist in monitoring and replacing electrolytes in this 88 y.o. female admitted on 08/19/2022 undergoing dofetilide initiation. First dofetilide dose: 12/13'@2000'$ .  Labs:    Component Value Date/Time   K 4.5 08/21/2022 0211   MG 2.4 08/21/2022 0211     Plan: Potassium: K >/= 4: No additional supplementation needed  Magnesium: Mg > 2: No additional supplementation needed   Thank you for allowing pharmacy to participate in this patient's care   Arrie Senate, PharmD, BCPS, [facility] Clinical Pharmacist [phone] Please check AMION for all [place] numbers 08/21/2022

## 2022-08-22 DIAGNOSIS — I4891 Unspecified atrial fibrillation: Secondary | ICD-10-CM | POA: Diagnosis not present

## 2022-08-22 DIAGNOSIS — I4892 Unspecified atrial flutter: Secondary | ICD-10-CM | POA: Diagnosis not present

## 2022-08-22 LAB — BASIC METABOLIC PANEL
Anion gap: 7 (ref 5–15)
BUN: 15 mg/dL (ref 8–23)
CO2: 27 mmol/L (ref 22–32)
Calcium: 8.9 mg/dL (ref 8.9–10.3)
Chloride: 106 mmol/L (ref 98–111)
Creatinine, Ser: 1.07 mg/dL — ABNORMAL HIGH (ref 0.44–1.00)
GFR, Estimated: 51 mL/min — ABNORMAL LOW (ref >60)
Glucose, Bld: 108 mg/dL — ABNORMAL HIGH (ref 70–99)
Potassium: 3.8 mmol/L (ref 3.5–5.1)
Sodium: 140 mmol/L (ref 135–145)

## 2022-08-22 LAB — HEPATIC FUNCTION PANEL
ALT: 15 U/L (ref 0–44)
AST: 28 U/L (ref 15–41)
Albumin: 3.4 g/dL — ABNORMAL LOW (ref 3.5–5.0)
Alkaline Phosphatase: 53 U/L (ref 38–126)
Bilirubin, Direct: <0.1 mg/dL (ref 0.0–0.2)
Total Bilirubin: 0.3 mg/dL (ref 0.3–1.2)
Total Protein: 5.8 g/dL — ABNORMAL LOW (ref 6.5–8.1)

## 2022-08-22 LAB — TSH: TSH: 1.923 u[IU]/mL (ref 0.350–4.500)

## 2022-08-22 LAB — MAGNESIUM: Magnesium: 2.3 mg/dL (ref 1.7–2.4)

## 2022-08-22 MED ORDER — RAMIPRIL 5 MG PO CAPS: 5.0000 mg | ORAL_CAPSULE | Freq: Two times a day (BID) | ORAL | 3 refills | Status: DC

## 2022-08-22 MED ORDER — AMIODARONE HCL 200 MG PO TABS: 200.0000 mg | ORAL_TABLET | Freq: Every day | ORAL | 1 refills | Status: DC

## 2022-08-22 MED ORDER — POTASSIUM CHLORIDE CRYS ER 20 MEQ PO TBCR: 40.0000 meq | EXTENDED_RELEASE_TABLET | Freq: Once | ORAL | Status: DC

## 2022-08-22 MED ORDER — AMIODARONE HCL 200 MG PO TABS: 200.0000 mg | ORAL_TABLET | Freq: Every day | ORAL | 3 refills | Status: DC

## 2022-08-22 MED ORDER — POTASSIUM CHLORIDE CRYS ER 20 MEQ PO TBCR: 20.0000 meq | EXTENDED_RELEASE_TABLET | Freq: Every day | ORAL | Status: DC

## 2022-08-22 MED ORDER — AMIODARONE HCL 200 MG PO TABS: 200.0000 mg | ORAL_TABLET | Freq: Every day | ORAL | Status: DC

## 2022-08-22 MED ORDER — POTASSIUM CHLORIDE CRYS ER 20 MEQ PO TBCR
40.0000 meq | EXTENDED_RELEASE_TABLET | Freq: Once | ORAL | Status: AC
  Administered 2022-08-22: 40 meq via ORAL
  Filled 2022-08-22: qty 2

## 2022-08-22 NOTE — Progress Notes (Addendum)
Pharmacy: Dofetilide (Tikosyn) - Follow Up Assessment and Electrolyte Replacement  Pharmacy consulted to assist in monitoring and replacing electrolytes in this 88 y.o. female admitted on 08/19/2022 undergoing dofetilide initiation. First dofetilide dose: 12/13'@2000'$ .  Labs:    Component Value Date/Time   K 3.8 08/22/2022 0125   MG 2.3 08/22/2022 0125     Plan: Potassium: K 3.8-3.9:  Give KCl 40 mEq po x1   Magnesium: Mg > 2: No additional supplementation needed  As patient has required 40 mEq of potassium replacement on last day of initiation, recommend discharging patient with prescription for:  Potassium chloride 20 mEq daily Thank you for allowing pharmacy to participate in this patient's care   Sandford Craze, PharmD. [facility] Acute Care PGY-1 08/22/2022 7:21 AM   ADDENDUM: -Patient [id number], will not be continuing tikosyn. Plan to switch patient to amiodarone 200 mg po QD per Dr. Omelia Blackwater, PharmD. [facility] Acute Care PGY-1  08/22/2022 8:23 AM

## 2022-08-22 NOTE — Progress Notes (Addendum)
Electrophysiology rounding note  Patient to be discharged today. Qtc in unfortunately prolonged today at 13, though it is possible a U wave is contributing to some of the prolongation. Regardless, I think it safer to abandon Tikosyn and use amiodarone, which we can have her start when it arrives from her mail pharmacy. Will check TSH and hepatic panel today prior to DC. I discussed the risks and need for routine monitoring with amiodarone. She expressed understanding. Potassium level has dropped today, but for the majority of the admission level has been about 4.5. I am less concerned about mild hypokalemia with amiodarone than with Tikosyn and will hold off on ordering routine potassium supplementation at home.  See discharge summary for additional details. Greater than 30 minutes spent on discharge today.

## 2022-08-22 NOTE — Plan of Care (Signed)

## 2022-08-22 NOTE — Discharge Instructions (Signed)
You can start taking Amiodarone once you receive it in the mail.

## 2022-08-22 NOTE — Discharge Summary (Signed)
Discharge Summary    Patient ID: Frances [name] MRN: [id number]; DOB: 12/23/1933  Admit date: 08/19/2022 Discharge date: 08/22/2022  PCP:  Charlane Ferretti, MD   [place] Prov[facility] Cardiologist:  [name], MD  Electrophysiologist:  Melida Quitter, MD       Discharge Diagnoses    Principal Problem:   Atrial fibrillation and flutter [facility])   Diagnostic Studies/Procedures    None  _____________   History of Present Illness     Frances [name] is a 88 y.o. female with a hx of persistent atrial fibrillation/flutter s/p prior ablation in 2017 and again in 2019, chronic kidney disease, hypertension, hypothyroidism. She was previously on Flecainide. She recently went back into atrial fibrillation and underwent DCCV but had ERAF. She was therefore set up for Tikosyn load.  Hospital Course     Consultants:     She was admitted and started on Tikosyn 250 mcg twice daily. She converted to NSR. Her QTc prolonged to 500 ms and her dose was reduced to 125 mcg twice daily. Unfortunately, her QTc was again prolonged at 514 ms today. She was evaluated by Dr. Myles Gip today. He felt that it would be safest to abandon Tikosynn and use Amiodarone. LFTs and TSH were drawn prior to DC today. She will be getting her Amiodarone via mail order. She can start Amiodarone 200 mg once daily once it arrives in the mail. Follow up has already been arranged 09/01/22 at 2:00 pm with Frances Peals, PA-C in the AFib Clinic. She is felt to be stable for DC to home.       Did the patient have an acute coronary syndrome (MI, NSTEMI, STEMI, etc) this admission?:  No                               Did the patient have a percutaneous coronary intervention (stent / angioplasty)?:  No.          _____________  Discharge Vitals Blood pressure (!) 144/79, pulse 70, temperature (!) 97.5 F (36.4 C), temperature source Oral, resp. rate 18, height '5\' 4"'$  (1.626 m), weight 81.4 kg, SpO2 96 %.   Filed Weights   08/19/22 1035  Weight: 81.4 kg    Labs & Radiologic Studies     Basic Metabolic Panel Recent Labs    08/21/22 0211 08/22/22 0125  NA 141 140  K 4.5 3.8  CL 107 106  CO2 28 27  GLUCOSE 101* 108*  BUN 16 15  CREATININE 1.22* 1.07*  CALCIUM 9.2 8.9  MG 2.4 2.3   _____________  No results found. Disposition   Pt is being discharged home today in good condition.  Follow-up Plans & Appointments     Follow-up Information     Fenton, Berton Mount R, PA Follow up on 09/01/2022.   Specialty: Cardiology Why: 2:00 pm Contact information: [place] [address] [phone]                Discharge Instructions     Diet - low sodium heart healthy   Complete by: As directed    Increase activity slowly   Complete by: As directed         Discharge Medications   Allergies as of 08/22/2022       Reactions   Pneumovax 23 [pneumococcal Vac Polyvalent] Other (See Comments)   Arm swelling & fever   Codeine Nausea  And Vomiting   Cephalexin Anxiety   Ciprofloxacin Itching, Rash   Sudafed [pseudoephedrine Hcl] Anxiety   Sulfa Antibiotics Rash        Medication List     TAKE these medications    acetaminophen 500 MG tablet Commonly known as: TYLENOL Take 500-1,000 mg by mouth every 6 (six) hours as needed for moderate pain or headache.   amiodarone 200 MG tablet Commonly known as: PACERONE Take 1 tablet (200 mg total) by mouth daily. Start taking on: August 23, 2022   Benefiber Powd Take 1 Scoop by mouth daily.   Biotin 5000 5 MG Caps Generic drug: Biotin Take 5 mg by mouth daily with lunch.   CALCIUM 600 + D PO Take 1 tablet by mouth daily with lunch. 600 mg / 20 mcg   cyanocobalamin 1000 MCG tablet Commonly known as: VITAMIN B12 Take 1,000 mcg by mouth daily with lunch.   diclofenac Sodium 1 % Gel Commonly known as: VOLTAREN Apply 1 Application topically as needed (pain).   diltiazem 30 MG tablet Commonly  known as: CARDIZEM TAKE 1 TABLET BY MOUTH EVERY 4 HOURS AS NEEDED (FOR FAST HEART RATE > 100 AS LONG AS BP > 100).   diltiazem 300 MG 24 hr capsule Commonly known as: CARDIZEM CD Take 300 mg by mouth daily.   Eliquis 5 MG Tabs tablet Generic drug: apixaban TAKE 1 TABLET BY MOUTH TWICE A DAY What changed: how much to take   famotidine-calcium carbonate-magnesium hydroxide 10-800-165 MG chewable tablet Commonly known as: PEPCID COMPLETE Chew 1 tablet by mouth daily as needed (acid reflux).   levothyroxine 75 MCG tablet Commonly known as: SYNTHROID Take 75 mcg by mouth daily before breakfast.   PARoxetine 30 MG tablet Commonly known as: PAXIL Take 30 mg by mouth daily.   PROBIOTIC PO Take 1 capsule by mouth daily with lunch.   ramipril 5 MG capsule Commonly known as: ALTACE Take 1 capsule (5 mg total) by mouth 2 (two) times daily. What changed:  medication strength how much to take   rosuvastatin 10 MG tablet Commonly known as: CRESTOR Take 1 tablet (10 mg total) by mouth daily.           Outstanding Labs/Studies      Duration of Discharge Encounter   Greater than 30 minutes including physician time.  Signed, Richardson Dopp, PA-C 08/22/2022, 10:30 AM

## 2022-09-01 ENCOUNTER — Ambulatory Visit (HOSPITAL_COMMUNITY)
Admission: RE | Admit: 2022-09-01 | Discharge: 2022-09-01 | Disposition: A | Discharge status: Home / Self Care | Payer: Medicare Other | Source: Ambulatory Visit | Attending: Physician Assistant | Admitting: Physician Assistant

## 2022-09-01 DIAGNOSIS — I4819 Other persistent atrial fibrillation: Secondary | ICD-10-CM | POA: Insufficient documentation

## 2022-09-01 MED ORDER — AMIODARONE HCL 200 MG PO TABS: ORAL_TABLET | ORAL | 0 refills | Status: DC

## 2022-09-01 NOTE — Patient Instructions (Signed)
Increase amiodarone to '200mg'$  TWICE A DAY

## 2022-09-01 NOTE — Progress Notes (Signed)
Patient returns for ECG after starting amiodarone. ECG shows:  Afib  Vent. rate 77 BPM PR interval * ms QRS duration 84 ms QT/QTcB 372/420 ms  Will increase amiodarone to 200 mg BID for a faster load. Will have her f/u in the AF clinic in 2 weeks. If she has not chemically converted, will arrange for DCCV.

## 2022-09-11 ENCOUNTER — Other Ambulatory Visit: Payer: Self-pay | Admitting: Internal Medicine

## 2022-09-16 ENCOUNTER — Ambulatory Visit (HOSPITAL_COMMUNITY)
Admission: RE | Admit: 2022-09-16 | Discharge: 2022-09-16 | Disposition: A | Discharge status: Home / Self Care | Payer: Medicare Other | Source: Ambulatory Visit | Attending: Nurse Practitioner | Admitting: Nurse Practitioner

## 2022-09-16 ENCOUNTER — Encounter (HOSPITAL_COMMUNITY): Payer: Self-pay | Admitting: Nurse Practitioner

## 2022-09-16 VITALS — BP 138/64 | HR 79 | Ht 64.0 in | Wt 184.6 lb

## 2022-09-16 DIAGNOSIS — D6869 Other thrombophilia: Secondary | ICD-10-CM | POA: Diagnosis not present

## 2022-09-16 DIAGNOSIS — I4892 Unspecified atrial flutter: Secondary | ICD-10-CM | POA: Diagnosis not present

## 2022-09-16 DIAGNOSIS — I4819 Other persistent atrial fibrillation: Secondary | ICD-10-CM

## 2022-09-16 DIAGNOSIS — I4891 Unspecified atrial fibrillation: Secondary | ICD-10-CM | POA: Diagnosis not present

## 2022-09-16 MED ORDER — AMIODARONE HCL 200 MG PO TABS: ORAL_TABLET | ORAL | 0 refills | Status: DC

## 2022-09-16 NOTE — Patient Instructions (Signed)
Cardioversion scheduled for: Thursday, January 25th   - Arrive at the [facility] and go to admitting at 1130am   - Do not eat or drink anything after midnight the night prior to your procedure.   - Take all your morning medication (except diabetic medications) with a sip of water prior to arrival.  - You will not be able to drive home after your procedure.    - Do NOT miss any doses of your blood thinner - if you should miss a dose please notify our office immediately.   - If you feel as if you go back into normal rhythm prior to scheduled cardioversion, please notify our office immediately.  If your procedure is canceled in the cardioversion suite you will be charged a cancellation fee.   If you are on weekly OZEMPIC, TRULICITY, MOUNJARO, WEGOVY, OR BYDUREON  Hold medication 7 days prior to scheduled procedure/anesthesia.  Restart medication on the normal dosing day after scheduled procedure/anesthesia  If you are on daily BYETTA, VICTOZA, ADLYXIN, OR RYBELSUS:   Hold medication 24 hours prior to scheduled procedure/anesthesia.   Restart medication on the following day after scheduled procedure/anesthesia   For those patients who have a scheduled procedure/anesthesia on the same day of the week as their dose, hold the medication on the day of surgery.  They can take their scheduled dose the week before.  **Patients on the above medications scheduled for elective procedures that have not held the medication for the appropriate amount of time are at risk of cancellation or change in the anesthetic plan.

## 2022-09-16 NOTE — Progress Notes (Addendum)
Date:  09/16/2022   ID:  Frances [name], DOB 1934-03-28, MRN [id number]  Location: Office  Provider location: [place], [place] [address] Evaluation Performed: f/u persistent afib  PCP:  Charlane Ferretti, MD  Primary Cardiologist:   Dr. Marlou Porch  Primary Electrophysiologist:  Dr. Myles Gip   CC: persistent afib     History of Present Illness: Frances [name] is a 89 y.o. female who presents  for persistent afib since last week.  Pt with h/o of 2 prior ablation 2017 and most recent 09/2017 that has experienced persistent afib since last week. No known triggers. Her PCP recently increased cardizem for BP issues but she has not started. V rate 129 bpm today. She had several days of afib last May but converted on her own and DCCV was cancelled. Continues on eliquis with a CHA2DS2VASc score of 4.   F/u in afib clinic, 10/11/19, after successful cardioversion.  She remains in SR. She feels improved. Has noted mild ankle edema since increasing cardizem to 300 mg daily. Received her second covid shot this last week.   F/u in afib slinc 05/27/20. She went into persistent afib one week ago. No change in health,  although she does tell me she loss her husband in April. Her adult son with Parkinson's is living with her since being dx'ed and his wife asking him for a divorce. She has both vacccines and no missed anticoagulation.   F/u in afib clinic 07/08/20. She spontaneously reverted to SR so cardioversion was cancelled. She then had f/u with Dr. Marlou Porch  and was in afib. She was scheduled an appointment with Dr. Rayann Heman and he gave her several options but she wanted to revisit flecainide and was started on 50 mg bid. She is now in the office and is in SR with flecainide 50 mg bid.. She has a first degree block on flecainide similar  to EKG's on flecainide in the past. She feels well. No afib since start of flecainide.   F/u in afib clinic, 07/01/21. She  remains in SR. No issues with  afib. Continues on flecainide 50 mg bid. No issues with Eliquis 5 mg bid for a CHA2DS2VASc  score of 5.   F/u afib clinic, 08/27/21. Pt was seen by Dr. Rayann Heman 10/25 and was found to be in atypical atrial flutter for around 2 weeks. She was set up for cardioversion. Her loop monitor was explanted at that visit. Cardioversion 08/20/21 was successful. She was told if ERAF then he would consider another ablation.   F/u in afib clinic, 02/17/22. She reports that she is doing well, no afib to report.   F/u in afib clinic 03/04/22 as she felt she went into afib a few days earlier. EKG today shows atrial flutter  at 110 bpm. Out of rhythm since last Tuesday. No know trigger.   Pt was seen back for  EKG prior to cardioversion early July as she spontaneously converted, DCCV cancelled.   She is now in the afib clinic, 10/23. She has been out of rhythm x 2 weeks. Despite one day of extra flecainide and taking 30 mg Cardizem as needed. She is in atypical atrial flutter  today with v rates around 100 bpm. We discussed change of antiarrythmic and would like  to  discuss elective admit for Tikosyn after she is cardioverted back to SR. She would prefer not to  use amiodarone. No missed anticoagulation for the last 21  days.   07/10/22. She is here to f/u successful cardioversion. Unfortunately, she has had return of arrhythmia. It appears to be an atypical atrial flutter at 106 bpm. We discussed use of Tikosyn in the past but she would refer to discuss with a  EP first, she is waiting to get an appointment to reestablish  with EP in Dr. Jackalyn Lombard absence to discuss.  F/u in afib clinic, 08/18/22. She is here for Tikosyn admission. No missed anticoagulation. No benadryl use. Labs drawn. Qt c is 446. She is in rate controlled a flutter today. Price of drug discussed. Off flecainide  for several weeks.   F/u in afib clinic, 09/16/21. She failed Tikosyn with prolonged qt so load was aborted and decision was made to place pt on  amiodarone. By time of cardioversion (several weeks out) she will have completed her month on amiodarone. She has not missed any doses of anticoagulation for at least 21 days and is tolerating amiodarone well. She continues on 200 mg amiodarone bid and will stay on this dose until I see back one week s/p cardioversion.    Today, she denies symptoms of palpitations, chest pain, shortness of breath, orthopnea, PND, lower extremity edema, claudication, dizziness, presyncope, syncope, bleeding, or neurologic sequela. The patient is tolerating medications without difficulties and is otherwise without complaint today.   she denies symptoms of cough, fevers, chills, or new SOB worrisome for COVID 19.    she has a BMI of Body mass index is 31.69 kg/m.Marland Kitchen Filed Weights   09/16/22 1429  Weight: 83.7 kg      Past Medical History:  Diagnosis Date   Arthritis    hands, back   Asthmatic bronchitis    Atrial fibrillation ([place]) 07/31/2013   Chronic anticoagulation 07/30/2014   Chronic kidney disease    stage 3 renal disease - no med   Dizziness 07/30/2014   Dysrhythmia    Hx - a-fib 05/2010 - tx with meds, no problem since 05/2010   First degree AV block 07/30/2014   Hearing loss    bilateral hearing aids   Hypertension    controlled with meds   Hypothyroidism    Internal hemorrhoid    PAF (paroxysmal atrial fibrillation) ([place]) 11/28/2015   Paroxysmal atrial fibrillation ([facility])    Peripheral vascular disease ([place])    right arm and right shoulder blood clots r/t a fall   Persistent atrial fibrillation ([place]) 09/28/2017   Rectal bleeding    SVD (spontaneous vaginal delivery) 1957, 1959   x 2   Thyroid disease    hypothyroidism   Past Surgical History:  Procedure Laterality Date   ABDOMINAL HYSTERECTOMY     and right ovary   ANTERIOR AND POSTERIOR REPAIR N/A 01/03/2014   Procedure: ANTERIOR (CYSTOCELE) REPAIR;  Surgeon: Claiborne Billings A. Pamala Hurry, MD;  Location: [facility];  Service: Gynecology;   Laterality: N/A;   APPENDECTOMY     ATRIAL FIBRILLATION ABLATION N/A 09/28/2017   Procedure: ATRIAL FIBRILLATION ABLATION;  Surgeon: [name], MD;  Location: [facility] [facility];  Service: Cardiovascular;  Laterality: N/A;   BACK SURGERY     x 3 - 2 rods and 8 screws   CARDIOVERSION N/A 08/24/2017   Procedure: CARDIOVERSION;  Surgeon: Jerline Pain, MD;  Location: [facility];  Service: Cardiovascular;  Laterality: N/A;   CARDIOVERSION N/A 10/19/2017   Procedure: CARDIOVERSION;  Surgeon: Josue Hector, MD;  Location: [facility];  Service: Cardiovascular;  Laterality: N/A;   CARDIOVERSION N/A 10/04/2019  Procedure: CARDIOVERSION;  Surgeon: Pixie Casino, MD;  Location: [place];  Service: Cardiovascular;  Laterality: N/A;   CARDIOVERSION N/A 08/20/2021   Procedure: CARDIOVERSION;  Surgeon: Jerline Pain, MD;  Location: [facility] ENDOSCOPY;  Service: Cardiovascular;  Laterality: N/A;   CARDIOVERSION N/A 07/02/2022   Procedure: CARDIOVERSION;  Surgeon: Geralynn Rile, MD;  Location: [facility];  Service: Cardiovascular;  Laterality: N/A;   COLONOSCOPY     CYSTOCELE REPAIR     DENTAL SURGERY     infected tooth - general   DILATION AND CURETTAGE OF UTERUS     x several   ELECTROPHYSIOLOGIC STUDY N/A 11/28/2015   Procedure: Atrial Fibrillation Ablation;  Surgeon: [name], MD;  Location: [place] [facility];  Service: Cardiovascular;  Laterality: N/A;   EP IMPLANTABLE DEVICE N/A 06/08/2016   Procedure: Loop Recorder Insertion;  Surgeon: [name], MD;  Location: [place];  Service: Cardiovascular;  Laterality: N/A;   EXAMINATION UNDER ANESTHESIA N/A 01/15/2014   Procedure: EXAM UNDER ANESTHESIA with evacuation of hematoma and over sewing of vaginal mucosa.;  Surgeon: Floyce Stakes. Pamala Hurry, MD;  Location: [facility];  Service: Gynecology;  Laterality: N/A;   HAND SURGERY     left   hemorrhoid injection  04/2011   implantable loop recorder removal  08/06/2021    MDT Reveal LINQ removed by Dr Rayann Heman   JOINT REPLACEMENT     left thumb replacement   MYRINGOTOMY WITH TUBE PLACEMENT Right 10/27/2018   TEE WITHOUT CARDIOVERSION N/A 09/27/2017   Procedure: TRANSESOPHAGEAL ECHOCARDIOGRAM (TEE);  Surgeon: Sueanne Margarita, MD;  Location: [facility] END[facility]COPY;  Service: Cardiovascular;  Laterality: N/A;   TONSILLECTOMY       Current Outpatient Medications  Medication Sig Dispense Refill   acetaminophen (TYLENOL) 500 MG tablet Take 500-1,000 mg by mouth every 6 (six) hours as needed for moderate pain or headache.     apixaban (ELIQUIS) 5 MG TABS tablet TAKE 1 TABLET BY MOUTH TWICE A DAY (Patient taking differently: Take 5 mg by mouth 2 (two) times daily.) 180 tablet 1   Biotin (BIOTIN 5000) 5 MG CAPS Take 5 mg by mouth daily with lunch.     Calcium Carb-Cholecalciferol (CALCIUM 600 + D PO) Take 1 tablet by mouth daily with lunch. 600 mg / 20 mcg     diclofenac Sodium (VOLTAREN) 1 % GEL Apply 1 Application topically as needed (pain).     diltiazem (CARDIZEM CD) 300 MG 24 hr capsule Take 300 mg by mouth daily.     diltiazem (CARDIZEM) 30 MG tablet TAKE 1 TABLET BY MOUTH EVERY 4 HOURS AS NEEDED (FOR FAST HEART RATE > 100 AS LONG AS BP > 100). 135 tablet 1   famotidine-calcium carbonate-magnesium hydroxide (PEPCID COMPLETE) 10-800-165 MG chewable tablet Chew 1 tablet by mouth daily as needed (acid reflux).     levothyroxine (SYNTHROID, LEVOTHROID) 75 MCG tablet Take 75 mcg by mouth daily before breakfast.     PARoxetine (PAXIL) 30 MG tablet Take 30 mg by mouth daily.     Probiotic Product (PROBIOTIC PO) Take 1 capsule by mouth daily with lunch.     ramipril (ALTACE) 5 MG capsule Take 1 capsule (5 mg total) by mouth 2 (two) times daily. 180 capsule 3   rosuvastatin (CRESTOR) 10 MG tablet Take 1 tablet (10 mg total) by mouth daily. 30 tablet 5   vitamin B-12 (CYANOCOBALAMIN) 1000 MCG tablet Take 1,000 mcg by mouth daily with lunch.     Wheat Dextrin (  BENEFIBER) POWD Take 1  Scoop by mouth daily.     amiodarone (PACERONE) 200 MG tablet Take 1 tablet by mouth twice a day for 1 month then reduce to 1 tablet daily 60 tablet 0   No current facility-administered medications for this encounter.    Allergies:   Pneumovax 23 [pneumococcal vac polyvalent], Codeine, Cephalexin, Ciprofloxacin, Sudafed [pseudoephedrine hcl], and Sulfa antibiotics   Social History:  The patient  reports that she has never smoked. She has been exposed to tobacco smoke. She has never used smokeless tobacco. She reports that she does not drink alcohol and does not use drugs.   Family History:  The patient's  family history includes Diabetes in her father; Heart attack in her mother and sister; Heart disease in her brother, mother, and sister; Heart failure in her brother; Hypertension in her father and sister; Stroke in her father; Sudden death in her maternal grandfather.    ROS:  Please see the history of present illness.   All other systems are personally reviewed and negative.   Exam: GEN- The patient is well appearing, alert and oriented x 3 today.   Head- normocephalic, atraumatic Eyes-  Sclera clear, conjunctiva pink Ears- hearing intact Oropharynx- clear Neck- supple, no JVP Lymph- no cervical lymphadenopathy Lungs- Clear to ausculation bilaterally, normal work of breathing Heart- irregular rate and rhythm, no murmurs, rubs or gallops, PMI not laterally displaced GI- soft, NT, ND, + BS Extremities- no clubbing, cyanosis, or edema MS- no significant deformity or atrophy Skin- no rash or lesion Psych- euthymic mood, full affect Neuro- strength and sensation are intact    EKG: Vent. rate 79 BPM PR interval * ms QRS duration 90 ms QT/QTcB 384/440 ms P-R-T axes * -63 13 Atrial flutter with variable A-V block Left axis deviation Septal infarct , age undetermined Abnormal ECG When compared with ECG of 01-Sep-2022 14:05, PREVIOUS ECG IS PRESENT   ASSESSMENT AND  PLAN:  1.Persisitent symptomatic atrial fibrillation/flutter Failed Tikosyn for prolonged qt Started on amiodarone 200 mg bid mid December  Cardioversion scheduled for 10/01/22 Continue Cardizem at  300 mg daily No missed anticoagulation for at least 3 weeks  bmet/mag  prior to CV   This patients CHA2DS2-VASc Score and unadjusted Ischemic Stroke Rate (% per year) is equal to 4.8 % stroke rate/year from a score of 4  Above score calculated as 1 point each if present [CHF, HTN, DM, Vascular=MI/PAD/Aortic Plaque, Age if 65-74, or Female] Above score calculated as 2 points each if present [Age > 75, or Stroke/TIA/TE]   Follow-up:f/u in afib clinic one week after cardioversion at which time I will reduce amiodarone to 200 mg daily     Signed, Butch Penny C. Larisha Vencill, [facility] [address] [place], [place] [address] [phone]

## 2022-09-16 NOTE — H&P (View-Only) (Signed)
Date:  09/16/2022   ID:  Frances [name], DOB 1934/03/23, MRN [id number]  Location: Office  Provider location: [place], [place] [address] Evaluation Performed: f/u persistent afib  PCP:  Charlane Ferretti, MD  Primary Cardiologist:   Dr. Marlou Porch  Primary Electrophysiologist:  Dr. Myles Gip   CC: persistent afib     History of Present Illness: Frances [name] is a 89 y.o. female who presents  for persistent afib since last week.  Pt with h/o of 2 prior ablation 2017 and most recent 09/2017 that has experienced persistent afib since last week. No known triggers. Her PCP recently increased cardizem for BP issues but she has not started. V rate 129 bpm today. She had several days of afib last May but converted on her own and DCCV was cancelled. Continues on eliquis with a CHA2DS2VASc score of 4.   F/u in afib clinic, 10/11/19, after successful cardioversion.  She remains in SR. She feels improved. Has noted mild ankle edema since increasing cardizem to 300 mg daily. Received her second covid shot this last week.   F/u in afib [facility] 05/27/20. She went into persistent afib one week ago. No change in health,  although she does tell me she loss her husband in April. Her adult son with Parkinson's is living with her since being dx'ed and his wife asking him for a divorce. She has both vacccines and no missed anticoagulation.   F/u in afib clinic 07/08/20. She spontaneously reverted to SR so cardioversion was cancelled. She then had f/u with Dr. Marlou Porch  and was in afib. She was scheduled an appointment with Dr. Rayann Heman and he gave her several options but she wanted to revisit flecainide and was started on 50 mg bid. She is now in the office and is in SR with flecainide 50 mg bid.. She has a first degree block on flecainide similar  to EKG's on flecainide in the past. She feels well. No afib since start of flecainide.   F/u in afib clinic, 07/01/21. She  remains in SR. No issues with  afib. Continues on flecainide 50 mg bid. No issues with Eliquis 5 mg bid for a CHA2DS2VASc  score of 5.   F/u afib clinic, 08/27/21. Pt was seen by Dr. Rayann Heman 10/25 and was found to be in atypical atrial flutter for around 2 weeks. She was set up for cardioversion. Her loop monitor was explanted at that visit. Cardioversion 08/20/21 was successful. She was told if ERAF then he would consider another ablation.   F/u in afib clinic, 02/17/22. She reports that she is doing well, no afib to report.   F/u in afib clinic 03/04/22 as she felt she went into afib a few days earlier. EKG today shows atrial flutter  at 110 bpm. Out of rhythm since last Tuesday. No know trigger.   Pt was seen back for  EKG prior to cardioversion early July as she spontaneously converted, DCCV cancelled.   She is now in the afib clinic, 10/23. She has been out of rhythm x 2 weeks. Despite one day of extra flecainide and taking 30 mg Cardizem as needed. She is in atypical atrial flutter  today with v rates around 100 bpm. We discussed change of antiarrythmic and would like  to  discuss elective admit for Tikosyn after she is cardioverted back to SR. She would prefer not to  use amiodarone. No missed anticoagulation for the last 21  days.   07/10/22. She is here to f/u successful cardioversion. Unfortunately, she has had return of arrhythmia. It appears to be an atypical atrial flutter at 106 bpm. We discussed use of Tikosyn in the past but she would refer to discuss with a  EP first, she is waiting to get an appointment to reestablish  with EP in Dr. Jackalyn Lombard absence to discuss.  F/u in afib clinic, 08/18/22. She is here for Tikosyn admission. No missed anticoagulation. No benadryl use. Labs drawn. Qt c is 446. She is in rate controlled a flutter today. Price of drug discussed. Off flecainide  for several weeks.   F/u in afib clinic, 09/16/21. She failed Tikosyn with prolonged qt so load was aborted and decision was made to place pt on  amiodarone. By time of cardioversion (several weeks out) she will have completed her month on amiodarone. She has not missed any doses of anticoagulation for at least 21 days and is tolerating amiodarone well. She continues on 200 mg amiodarone bid and will stay on this dose until I see back one week s/p cardioversion.    Today, she denies symptoms of palpitations, chest pain, shortness of breath, orthopnea, PND, lower extremity edema, claudication, dizziness, presyncope, syncope, bleeding, or neurologic sequela. The patient is tolerating medications without difficulties and is otherwise without complaint today.   she denies symptoms of cough, fevers, chills, or new SOB worrisome for COVID 19.    she has a BMI of Body mass index is 31.69 kg/m.Marland Kitchen Filed Weights   09/16/22 1429  Weight: 83.7 kg      Past Medical History:  Diagnosis Date   Arthritis    hands, back   Asthmatic bronchitis    Atrial fibrillation ([facility]) 07/31/2013   Chronic anticoagulation 07/30/2014   Chronic kidney disease    stage 3 renal disease - no med   Dizziness 07/30/2014   Dysrhythmia    Hx - a-fib 05/2010 - tx with meds, no problem since 05/2010   First degree AV block 07/30/2014   Hearing loss    bilateral hearing aids   Hypertension    controlled with meds   Hypothyroidism    Internal hemorrhoid    PAF (paroxysmal atrial fibrillation) ([facility]) 11/28/2015   Paroxysmal atrial fibrillation ([facility])    Peripheral vascular disease ([facility])    right arm and right shoulder blood clots r/t a fall   Persistent atrial fibrillation ([facility]) 09/28/2017   Rectal bleeding    SVD (spontaneous vaginal delivery) 1957, 1959   x 2   Thyroid disease    hypothyroidism   Past Surgical History:  Procedure Laterality Date   ABDOMINAL HYSTERECTOMY     and right ovary   ANTERIOR AND POSTERIOR REPAIR N/A 01/03/2014   Procedure: ANTERIOR (CYSTOCELE) REPAIR;  Surgeon: Claiborne Billings A. Pamala Hurry, MD;  Location: [facility];  Service: Gynecology;   Laterality: N/A;   APPENDECTOMY     ATRIAL FIBRILLATION ABLATION N/A 09/28/2017   Procedure: ATRIAL FIBRILLATION ABLATION;  Surgeon: [name], MD;  Location: [facility];  Service: Cardiovascular;  Laterality: N/A;   BACK SURGERY     x 3 - 2 rods and 8 screws   CARDIOVERSION N/A 08/24/2017   Procedure: CARDIOVERSION;  Surgeon: Jerline Pain, MD;  Location: [facility];  Service: Cardiovascular;  Laterality: N/A;   CARDIOVERSION N/A 10/19/2017   Procedure: CARDIOVERSION;  Surgeon: Josue Hector, MD;  Location: [facility] END[facility]COPY;  Service: Cardiovascular;  Laterality: N/A;   CARDIOVERSION N/A 10/04/2019  Procedure: CARDIOVERSION;  Surgeon: Pixie Casino, MD;  Location: [facility];  Service: Cardiovascular;  Laterality: N/A;   CARDIOVERSION N/A 08/20/2021   Procedure: CARDIOVERSION;  Surgeon: Jerline Pain, MD;  Location: [facility] ENDOSCOPY;  Service: Cardiovascular;  Laterality: N/A;   CARDIOVERSION N/A 07/02/2022   Procedure: CARDIOVERSION;  Surgeon: Geralynn Rile, MD;  Location: [facility];  Service: Cardiovascular;  Laterality: N/A;   COLONOSCOPY     CYSTOCELE REPAIR     DENTAL SURGERY     infected tooth - general   DILATION AND CURETTAGE OF UTERUS     x several   ELECTROPHYSIOLOGIC STUDY N/A 11/28/2015   Procedure: Atrial Fibrillation Ablation;  Surgeon: [name], MD;  Location: [facility];  Service: Cardiovascular;  Laterality: N/A;   EP IMPLANTABLE DEVICE N/A 06/08/2016   Procedure: Loop Recorder Insertion;  Surgeon: [name], MD;  Location: [facility];  Service: Cardiovascular;  Laterality: N/A;   EXAMINATION UNDER ANESTHESIA N/A 01/15/2014   Procedure: EXAM UNDER ANESTHESIA with evacuation of hematoma and over sewing of vaginal mucosa.;  Surgeon: Floyce Stakes. Pamala Hurry, MD;  Location: [facility];  Service: Gynecology;  Laterality: N/A;   HAND SURGERY     left   hemorrhoid injection  04/2011   implantable loop recorder removal  08/06/2021    MDT Reveal LINQ removed by Dr Rayann Heman   JOINT REPLACEMENT     left thumb replacement   MYRINGOTOMY WITH TUBE PLACEMENT Right 10/27/2018   TEE WITHOUT CARDIOVERSION N/A 09/27/2017   Procedure: TRANSESOPHAGEAL ECHOCARDIOGRAM (TEE);  Surgeon: Sueanne Margarita, MD;  Location: [facility];  Service: Cardiovascular;  Laterality: N/A;   TONSILLECTOMY       Current Outpatient Medications  Medication Sig Dispense Refill   acetaminophen (TYLENOL) 500 MG tablet Take 500-1,000 mg by mouth every 6 (six) hours as needed for moderate pain or headache.     apixaban (ELIQUIS) 5 MG TABS tablet TAKE 1 TABLET BY MOUTH TWICE A DAY (Patient taking differently: Take 5 mg by mouth 2 (two) times daily.) 180 tablet 1   Biotin (BIOTIN 5000) 5 MG CAPS Take 5 mg by mouth daily with lunch.     Calcium Carb-Cholecalciferol (CALCIUM 600 + D PO) Take 1 tablet by mouth daily with lunch. 600 mg / 20 mcg     diclofenac Sodium (VOLTAREN) 1 % GEL Apply 1 Application topically as needed (pain).     diltiazem (CARDIZEM CD) 300 MG 24 hr capsule Take 300 mg by mouth daily.     diltiazem (CARDIZEM) 30 MG tablet TAKE 1 TABLET BY MOUTH EVERY 4 HOURS AS NEEDED (FOR FAST HEART RATE > 100 AS LONG AS BP > 100). 135 tablet 1   famotidine-calcium carbonate-magnesium hydroxide (PEPCID COMPLETE) 10-800-165 MG chewable tablet Chew 1 tablet by mouth daily as needed (acid reflux).     levothyroxine (SYNTHROID, LEVOTHROID) 75 MCG tablet Take 75 mcg by mouth daily before breakfast.     PARoxetine (PAXIL) 30 MG tablet Take 30 mg by mouth daily.     Probiotic Product (PROBIOTIC PO) Take 1 capsule by mouth daily with lunch.     ramipril (ALTACE) 5 MG capsule Take 1 capsule (5 mg total) by mouth 2 (two) times daily. 180 capsule 3   rosuvastatin (CRESTOR) 10 MG tablet Take 1 tablet (10 mg total) by mouth daily. 30 tablet 5   vitamin B-12 (CYANOCOBALAMIN) 1000 MCG tablet Take 1,000 mcg by mouth daily with lunch.     Wheat Dextrin (  BENEFIBER) POWD Take 1  Scoop by mouth daily.     amiodarone (PACERONE) 200 MG tablet Take 1 tablet by mouth twice a day for 1 month then reduce to 1 tablet daily 60 tablet 0   No current facility-administered medications for this encounter.    Allergies:   Pneumovax 23 [pneumococcal vac polyvalent], Codeine, Cephalexin, Ciprofloxacin, Sudafed [pseudoephedrine hcl], and Sulfa antibiotics   Social History:  The patient  reports that she has never smoked. She has been exposed to tobacco smoke. She has never used smokeless tobacco. She reports that she does not drink alcohol and does not use drugs.   Family History:  The patient's  family history includes Diabetes in her father; Heart attack in her mother and sister; Heart disease in her brother, mother, and sister; Heart failure in her brother; Hypertension in her father and sister; Stroke in her father; Sudden death in her maternal grandfather.    ROS:  Please see the history of present illness.   All other systems are personally reviewed and negative.   Exam: GEN- The patient is well appearing, alert and oriented x 3 today.   Head- normocephalic, atraumatic Eyes-  Sclera clear, conjunctiva pink Ears- hearing intact Oropharynx- clear Neck- supple, no JVP Lymph- no cervical lymphadenopathy Lungs- Clear to ausculation bilaterally, normal work of breathing Heart- irregular rate and rhythm, no murmurs, rubs or gallops, PMI not laterally displaced GI- soft, NT, ND, + BS Extremities- no clubbing, cyanosis, or edema MS- no significant deformity or atrophy Skin- no rash or lesion Psych- euthymic mood, full affect Neuro- strength and sensation are intact    EKG: Vent. rate 79 BPM PR interval * ms QRS duration 90 ms QT/QTcB 384/440 ms P-R-T axes * -63 13 Atrial flutter with variable A-V block Left axis deviation Septal infarct , age undetermined Abnormal ECG When compared with ECG of 01-Sep-2022 14:05, PREVIOUS ECG IS PRESENT   ASSESSMENT AND  PLAN:  1.Persisitent symptomatic atrial fibrillation/flutter Failed Tikosyn for prolonged qt Started on amiodarone 200 mg bid mid December  Cardioversion scheduled for 10/01/22 Continue Cardizem at  300 mg daily No missed anticoagulation for at least 3 weeks  bmet/mag  prior to CV   This patients CHA2DS2-VASc Score and unadjusted Ischemic Stroke Rate (% per year) is equal to 4.8 % stroke rate/year from a score of 4  Above score calculated as 1 point each if present [CHF, HTN, DM, Vascular=MI/PAD/Aortic Plaque, Age if 65-74, or Female] Above score calculated as 2 points each if present [Age > 75, or Stroke/TIA/TE]   Follow-up:f/u in afib clinic one week after cardioversion at which time I will reduce amiodarone to 200 mg daily     Signed, Frances [name], [facility] [address] [place], [place] [address] [phone]

## 2022-09-19 ENCOUNTER — Other Ambulatory Visit: Payer: Self-pay | Admitting: Cardiology

## 2022-09-29 ENCOUNTER — Ambulatory Visit (HOSPITAL_COMMUNITY)
Admission: RE | Admit: 2022-09-29 | Discharge: 2022-09-29 | Disposition: A | Discharge status: Home / Self Care | Payer: Medicare Other | Source: Ambulatory Visit | Attending: Physician Assistant | Admitting: Physician Assistant

## 2022-09-29 DIAGNOSIS — I4819 Other persistent atrial fibrillation: Secondary | ICD-10-CM | POA: Insufficient documentation

## 2022-09-29 LAB — CBC
HCT: 44.5 % (ref 36.0–46.0)
Hemoglobin: 14.3 g/dL (ref 12.0–15.0)
MCH: 28.1 pg (ref 26.0–34.0)
MCHC: 32.1 g/dL (ref 30.0–36.0)
MCV: 87.4 fL (ref 80.0–100.0)
Platelets: 238 10*3/uL (ref 150–400)
RBC: 5.09 MIL/uL (ref 3.87–5.11)
RDW: 14.6 % (ref 11.5–15.5)
WBC: 7.3 10*3/uL (ref 4.0–10.5)
nRBC: 0 % (ref 0.0–0.2)

## 2022-09-29 LAB — BASIC METABOLIC PANEL
Anion gap: 10 (ref 5–15)
BUN: 15 mg/dL (ref 8–23)
CO2: 25 mmol/L (ref 22–32)
Calcium: 9.4 mg/dL (ref 8.9–10.3)
Chloride: 102 mmol/L (ref 98–111)
Creatinine, Ser: 1.2 mg/dL — ABNORMAL HIGH (ref 0.44–1.00)
GFR, Estimated: 44 mL/min — ABNORMAL LOW (ref >60)
Glucose, Bld: 126 mg/dL — ABNORMAL HIGH (ref 70–99)
Potassium: 4.3 mmol/L (ref 3.5–5.1)
Sodium: 137 mmol/L (ref 135–145)

## 2022-10-01 ENCOUNTER — Encounter (HOSPITAL_COMMUNITY): Payer: Self-pay | Admitting: Cardiology

## 2022-10-01 ENCOUNTER — Ambulatory Visit (HOSPITAL_COMMUNITY)
Admission: RE | Admit: 2022-10-01 | Discharge: 2022-10-01 | Disposition: A | Discharge status: Home / Self Care | Payer: Medicare Other | Attending: Cardiology | Admitting: Cardiology

## 2022-10-01 ENCOUNTER — Other Ambulatory Visit: Payer: Self-pay

## 2022-10-01 ENCOUNTER — Ambulatory Visit (HOSPITAL_COMMUNITY): Payer: Medicare Other | Admitting: Anesthesiology

## 2022-10-01 ENCOUNTER — Ambulatory Visit (HOSPITAL_BASED_OUTPATIENT_CLINIC_OR_DEPARTMENT_OTHER): Payer: Medicare Other | Admitting: Anesthesiology

## 2022-10-01 ENCOUNTER — Encounter (HOSPITAL_COMMUNITY)
Admission: RE | Disposition: A | Discharge status: Home / Self Care | Payer: Self-pay | Source: Home / Self Care | Attending: Cardiology

## 2022-10-01 DIAGNOSIS — K219 Gastro-esophageal reflux disease without esophagitis: Secondary | ICD-10-CM | POA: Diagnosis not present

## 2022-10-01 DIAGNOSIS — I739 Peripheral vascular disease, unspecified: Secondary | ICD-10-CM | POA: Insufficient documentation

## 2022-10-01 DIAGNOSIS — I4819 Other persistent atrial fibrillation: Secondary | ICD-10-CM | POA: Insufficient documentation

## 2022-10-01 DIAGNOSIS — Z950 Presence of cardiac pacemaker: Secondary | ICD-10-CM | POA: Diagnosis not present

## 2022-10-01 DIAGNOSIS — I2510 Atherosclerotic heart disease of native coronary artery without angina pectoris: Secondary | ICD-10-CM | POA: Diagnosis not present

## 2022-10-01 DIAGNOSIS — Z79899 Other long term (current) drug therapy: Secondary | ICD-10-CM | POA: Insufficient documentation

## 2022-10-01 DIAGNOSIS — E039 Hypothyroidism, unspecified: Secondary | ICD-10-CM

## 2022-10-01 DIAGNOSIS — I484 Atypical atrial flutter: Secondary | ICD-10-CM | POA: Insufficient documentation

## 2022-10-01 DIAGNOSIS — J45909 Unspecified asthma, uncomplicated: Secondary | ICD-10-CM | POA: Diagnosis not present

## 2022-10-01 DIAGNOSIS — I4891 Unspecified atrial fibrillation: Secondary | ICD-10-CM

## 2022-10-01 DIAGNOSIS — M1990 Unspecified osteoarthritis, unspecified site: Secondary | ICD-10-CM | POA: Insufficient documentation

## 2022-10-01 DIAGNOSIS — I10 Essential (primary) hypertension: Secondary | ICD-10-CM

## 2022-10-01 HISTORY — PX: CARDIOVERSION: SHX1299

## 2022-10-01 SURGERY — CARDIOVERSION
Anesthesia: General

## 2022-10-01 MED ORDER — SODIUM CHLORIDE 0.9 % IV SOLN: INTRAVENOUS | Status: DC

## 2022-10-01 NOTE — CV Procedure (Signed)
    Electrical Cardioversion Procedure Note Frances [name] [id number] 09/07/1934  Procedure: Electrical Cardioversion Indications:  Atrial Fibrillation  Time Out: Verified patient identification, verified procedure,medications/allergies/relevent history reviewed, required imaging and test results available.  Performed  Procedure Details  The patient was NPO after midnight. Anesthesia was administered at the beside  by Dr. Sabra Heck with propofol.  Cardioversion was performed with synchronized biphasic defibrillation via AP pads with 150 joules.  1 attempt(s) were performed.  The patient converted to normal sinus rhythm. The patient tolerated the procedure well   IMPRESSION:  Successful cardioversion of atrial fibrillation   Frances [name] 10/01/2022, 12:44 PM

## 2022-10-01 NOTE — Interval H&P Note (Signed)
History and Physical Interval Note:  10/01/2022 12:19 PM  Frances [name]  has presented today for surgery, with the diagnosis of afib.  The various methods of treatment have been discussed with the patient and family. After consideration of risks, benefits and other options for treatment, the patient has consented to  Procedure(s): CARDIOVERSION (N/A) as a surgical intervention.  The patient's history has been reviewed, patient examined, no change in status, stable for surgery.  I have reviewed the patient's chart and labs.  Questions were answered to the patient's satisfaction.     [employer]

## 2022-10-01 NOTE — Anesthesia Preprocedure Evaluation (Signed)
Anesthesia Evaluation  Patient identified by MRN, date of birth, ID band Patient awake    Reviewed: Allergy & Precautions, NPO status , Patient's Chart, lab work & pertinent test results  Airway Mallampati: I  TM Distance: >3 FB Neck ROM: Full    Dental no notable dental hx. (+) Teeth Intact, Dental Advisory Given   Pulmonary asthma    Pulmonary exam normal breath sounds clear to auscultation       Cardiovascular hypertension, Pt. on medications + CAD and + Peripheral Vascular Disease  Normal cardiovascular exam+ dysrhythmias Atrial Fibrillation + pacemaker  Rhythm:Irregular Rate:Tachycardia  Echo: - Left ventricle: Systolic function was normal. The estimated  ejection fraction was in the range of 55% to 60%. Wall motion was  normal; there were no regional wall motion abnormalities.  - Aortic valve: Trileaflet; mildly thickened, mildly calcified  leaflets.  - Mitral valve: There was mild to moderate regurgitation, with  multiple jets.  - Left atrium: The atrium was moderately dilated. No evidence of  thrombus in the atrial cavity or appendage. There was  moderatecontinuous spontaneous echo contrast (&quot;smoke&quot;) in the  cavity and the appendage. The appendage was morphologically a  left appendage, multilobulated, and of normal size. Emptying  velocity was moderately reduced.  - Right atrium: The atrium was dilated. No evidence of thrombus in  the atrial cavity or appendage.  - Atrial septum: No defect or patent foramen ovale was identified.   Neuro/Psych negative neurological ROS  negative psych ROS   GI/Hepatic Neg liver ROS,GERD  Medicated,,  Endo/Other  Hypothyroidism    Renal/GU Renal disease     Musculoskeletal  (+) Arthritis ,    Abdominal  (+) + obese  Peds  Hematology negative hematology ROS (+)   Anesthesia Other Findings   Reproductive/Obstetrics                              Anesthesia Physical Anesthesia Plan  ASA: 3  Anesthesia Plan: General   Post-op Pain Management:    Induction: Intravenous  PONV Risk Score and Plan: 0  Airway Management Planned: Natural Airway and Mask  Additional Equipment: None  Intra-op Plan:   Post-operative Plan:   Informed Consent: I have reviewed the patients History and Physical, chart, labs and discussed the procedure including the risks, benefits and alternatives for the proposed anesthesia with the patient or authorized representative who has indicated his/her understanding and acceptance.       Plan Discussed with: CRNA  Anesthesia Plan Comments:         Anesthesia Quick Evaluation

## 2022-10-01 NOTE — Discharge Instructions (Signed)
Electrical Cardioversion °Electrical cardioversion is the delivery of a jolt of electricity to restore a normal rhythm to the heart. A rhythm that is too fast or is not regular keeps the heart from pumping well. In this procedure, sticky patches or metal paddles are placed on the chest to deliver electricity to the heart from a device. °This procedure may be done in an emergency if: °There is low or no blood pressure as a result of the heart rhythm. °Normal rhythm must be restored as fast as possible to protect the brain and heart from further damage. °It may save a life. °This may also be a scheduled procedure for irregular or fast heart rhythms that are not immediately life-threatening. ° °What can I expect after the procedure? °Your blood pressure, heart rate, breathing rate, and blood oxygen level will be monitored until you leave the hospital or clinic. °Your heart rhythm will be watched to make sure it does not change. °You may have some redness on the skin where the shocks were given. Over the counter cortizone cream may be helpful.  °Follow these instructions at home: °Do not drive for 24 hours if you were given a sedative during your procedure. °Take over-the-counter and prescription medicines only as told by your health care provider. °Ask your health care provider how to check your pulse. Check it often. °Rest for 48 hours after the procedure or as told by your health care provider. °Avoid or limit your caffeine use as told by your health care provider. °Keep all follow-up visits as told by your health care provider. This is important. °Contact a health care provider if: °You feel like your heart is beating too quickly or your pulse is not regular. °You have a serious muscle cramp that does not go away. °Get help right away if: °You have discomfort in your chest. °You are dizzy or you feel faint. °You have trouble breathing or you are short of breath. °Your speech is slurred. °You have trouble moving an  arm or leg on one side of your body. °Your fingers or toes turn cold or blue. °Summary °Electrical cardioversion is the delivery of a jolt of electricity to restore a normal rhythm to the heart. °This procedure may be done right away in an emergency or may be a scheduled procedure if the condition is not an emergency. °Generally, this is a safe procedure. °After the procedure, check your pulse often as told by your health care provider. °This information is not intended to replace advice given to you by your health care provider. Make sure you discuss any questions you have with your health care provider. °Document Revised: 03/27/2019 Document Reviewed: 03/27/2019 °Elsevier Patient Education © 2020 [employer]  °

## 2022-10-01 NOTE — Transfer of Care (Signed)
Immediate Anesthesia Transfer of Care Note  Patient: Frances [name]  Procedure(s) Performed: CARDIOVERSION  Patient Location: Endoscopy Unit  Anesthesia Type:MAC  Level of Consciousness: awake, alert , and oriented  Airway & Oxygen Therapy: Patient Spontanous Breathing  Post-op Assessment: Report given to RN and Post -op Vital signs reviewed and stable  Post vital signs: Reviewed and stable  Last Vitals:  Vitals Value Taken Time  BP 141/74 10/01/22 1246  Temp 36.1 C 10/01/22 1241  Pulse 66 10/01/22 1246  Resp 23 10/01/22 1246  SpO2 91 % 10/01/22 1244  Vitals shown include unvalidated device data.  Last Pain:  Vitals:   10/01/22 1246  TempSrc:   PainSc: 0-No pain         Complications: No notable events documented.

## 2022-10-02 NOTE — Anesthesia Postprocedure Evaluation (Signed)
Anesthesia Post Note  Patient: Frances [name]  Procedure(s) Performed: CARDIOVERSION     Patient location during evaluation: PACU Anesthesia Type: General Level of consciousness: awake and alert Pain management: pain level controlled Vital Signs Assessment: post-procedure vital signs reviewed and stable Respiratory status: spontaneous breathing, nonlabored ventilation and respiratory function stable Cardiovascular status: blood pressure returned to baseline and stable Postop Assessment: no apparent nausea or vomiting Anesthetic complications: no   No notable events documented.  Last Vitals:  Vitals:   10/01/22 1251 10/01/22 1256  BP: (!) 144/75 (!) 148/84  Pulse: 67 67  Resp: 16 16  Temp:    SpO2: 97% 97%    Last Pain:  Vitals:   10/01/22 1256  TempSrc:   PainSc: 0-No pain                 Lynda Rainwater

## 2022-10-06 ENCOUNTER — Ambulatory Visit
Admission: RE | Admit: 2022-10-06 | Discharge: 2022-10-06 | Disposition: A | Discharge status: Home / Self Care | Payer: Medicare Other | Source: Ambulatory Visit | Attending: Internal Medicine | Admitting: Internal Medicine

## 2022-10-06 DIAGNOSIS — Z1231 Encounter for screening mammogram for malignant neoplasm of breast: Secondary | ICD-10-CM

## 2022-10-08 ENCOUNTER — Ambulatory Visit (HOSPITAL_COMMUNITY)
Admission: RE | Admit: 2022-10-08 | Discharge: 2022-10-08 | Disposition: A | Discharge status: Home / Self Care | Payer: Medicare Other | Source: Ambulatory Visit | Attending: Nurse Practitioner | Admitting: Nurse Practitioner

## 2022-10-08 ENCOUNTER — Encounter (HOSPITAL_COMMUNITY): Payer: Self-pay | Admitting: Nurse Practitioner

## 2022-10-08 VITALS — BP 142/72 | HR 63 | Ht 64.0 in | Wt 185.4 lb

## 2022-10-08 DIAGNOSIS — I440 Atrioventricular block, first degree: Secondary | ICD-10-CM | POA: Insufficient documentation

## 2022-10-08 DIAGNOSIS — I480 Paroxysmal atrial fibrillation: Secondary | ICD-10-CM | POA: Diagnosis not present

## 2022-10-08 DIAGNOSIS — I4891 Unspecified atrial fibrillation: Secondary | ICD-10-CM

## 2022-10-08 DIAGNOSIS — Z79899 Other long term (current) drug therapy: Secondary | ICD-10-CM | POA: Diagnosis not present

## 2022-10-08 DIAGNOSIS — Z7901 Long term (current) use of anticoagulants: Secondary | ICD-10-CM | POA: Diagnosis not present

## 2022-10-08 DIAGNOSIS — I4892 Unspecified atrial flutter: Secondary | ICD-10-CM | POA: Diagnosis not present

## 2022-10-08 DIAGNOSIS — D6869 Other thrombophilia: Secondary | ICD-10-CM

## 2022-10-08 MED ORDER — AMIODARONE HCL 200 MG PO TABS: 200.0000 mg | ORAL_TABLET | Freq: Every day | ORAL | 1 refills | Status: DC

## 2022-10-08 NOTE — Progress Notes (Signed)
Date:  10/08/2022   ID:  Frances [name], DOB 07-06-1934, MRN [id number]  Location: Office  Provider location: [place], [place] [address] Evaluation Performed: f/u persistent afib  PCP:  Charlane Ferretti, MD  Primary Cardiologist:   Dr. Marlou Porch  Primary Electrophysiologist:  Dr. Myles Gip   CC: persistent afib     History of Present Illness: Frances [name] is a 89 y.o. female who presents  for persistent afib since last week.  Pt with h/o of 2 prior ablation 2017 and most recent 09/2017 that has experienced persistent afib since last week. No known triggers. Her PCP recently increased cardizem for BP issues but she has not started. V rate 129 bpm today. She had several days of afib last May but converted on her own and DCCV was cancelled. Continues on eliquis with a CHA2DS2VASc score of 4.   F/u in afib clinic, 10/11/19, after successful cardioversion.  She remains in SR. She feels improved. Has noted mild ankle edema since increasing cardizem to 300 mg daily. Received her second covid shot this last week.   F/u in afib [facility] 05/27/20. She went into persistent afib one week ago. No change in health,  although she does tell me she loss her husband in April. Her adult son with Parkinson's is living with her since being dx'ed and his wife asking him for a divorce. She has both vacccines and no missed anticoagulation.   F/u in afib clinic 07/08/20. She spontaneously reverted to SR so cardioversion was cancelled. She then had f/u with Dr. Marlou Porch  and was in afib. She was scheduled an appointment with Dr. Rayann Heman and he gave her several options but she wanted to revisit flecainide and was started on 50 mg bid. She is now in the office and is in SR with flecainide 50 mg bid.. She has a first degree block on flecainide similar  to EKG's on flecainide in the past. She feels well. No afib since start of flecainide.   F/u in afib clinic, 07/01/21. She  remains in SR. No issues with  afib. Continues on flecainide 50 mg bid. No issues with Eliquis 5 mg bid for a CHA2DS2VASc  score of 5.   F/u afib clinic, 08/27/21. Pt was seen by Dr. Rayann Heman 10/25 and was found to be in atypical atrial flutter for around 2 weeks. She was set up for cardioversion. Her loop monitor was explanted at that visit. Cardioversion 08/20/21 was successful. She was told if ERAF then he would consider another ablation.   F/u in afib clinic, 02/17/22. She reports that she is doing well, no afib to report.   F/u in afib clinic 03/04/22 as she felt she went into afib a few days earlier. EKG today shows atrial flutter  at 110 bpm. Out of rhythm since last Tuesday. No know trigger.   Pt was seen back for  EKG prior to cardioversion early July as she spontaneously converted, DCCV cancelled.   She is now in the afib clinic, 10/23. She has been out of rhythm x 2 weeks. Despite one day of extra flecainide and taking 30 mg Cardizem as needed. She is in atypical atrial flutter  today with v rates around 100 bpm. We discussed change of antiarrythmic and would like  to  discuss elective admit for Tikosyn after she is cardioverted back to SR. She would prefer not to  use amiodarone. No missed anticoagulation for the last 21  days.   07/10/22. She is here to f/u successful cardioversion. Unfortunately, she has had return of arrhythmia. It appears to be an atypical atrial flutter at 106 bpm. We discussed use of Tikosyn in the past but she would refer to discuss with a  EP first, she is waiting to get an appointment to reestablish  with EP in Dr. Jackalyn Lombard absence to discuss.  F/u in afib clinic, 08/18/22. She is here for Tikosyn admission. No missed anticoagulation. No benadryl use. Labs drawn. Qt c is 446. She is in rate controlled a flutter today. Price of drug discussed. Off flecainide  for several weeks.   F/u in afib clinic, 09/16/21. She failed Tikosyn with prolonged qt so load was aborted and decision was made to place pt on  amiodarone. By time of cardioversion (several weeks out) she will have completed her month on amiodarone. She has not missed any doses of anticoagulation for at least 21 days and is tolerating amiodarone well. She continues on 200 mg amiodarone bid and will stay on this dose until I see back one week s/p cardioversion.   F/u in afib clinic,10/08/22. She did not require cardioversion as she had converted with amiodarone load. She remains in SR. She does feel fatigued and short of breath. Hopefully reducing amio to 200 mg daily today  will improve these symptoms, but since last echo was in 2019, I will update this.   Today, she denies symptoms of palpitations, chest pain, shortness of breath, orthopnea, PND, lower extremity edema, claudication, dizziness, presyncope, syncope, bleeding, or neurologic sequela. The patient is tolerating medications without difficulties and is otherwise without complaint today.   she denies symptoms of cough, fevers, chills, or new SOB worrisome for COVID 19.    she has a BMI of Body mass index is 31.82 kg/m.Marland Kitchen Filed Weights   10/08/22 1359  Weight: 84.1 kg      Past Medical History:  Diagnosis Date   Arthritis    hands, back   Asthmatic bronchitis    Atrial fibrillation ([facility]) 07/31/2013   Chronic anticoagulation 07/30/2014   Chronic kidney disease    stage 3 renal disease - no med   Dizziness 07/30/2014   Dysrhythmia    Hx - a-fib 05/2010 - tx with meds, no problem since 05/2010   First degree AV block 07/30/2014   Hearing loss    bilateral hearing aids   Hypertension    controlled with meds   Hypothyroidism    Internal hemorrhoid    PAF (paroxysmal atrial fibrillation) ([facility]) 11/28/2015   Paroxysmal atrial fibrillation ([facility])    Peripheral vascular disease ([facility])    right arm and right shoulder blood clots r/t a fall   Persistent atrial fibrillation ([facility]) 09/28/2017   Rectal bleeding    SVD (spontaneous vaginal delivery) 1957, 1959   x 2   Thyroid  disease    hypothyroidism   Past Surgical History:  Procedure Laterality Date   ABDOMINAL HYSTERECTOMY     and right ovary   ANTERIOR AND POSTERIOR REPAIR N/A 01/03/2014   Procedure: ANTERIOR (CYSTOCELE) REPAIR;  Surgeon: Claiborne Billings A. Pamala Hurry, MD;  Location: [facility];  Service: Gynecology;  Laterality: N/A;   APPENDECTOMY     ATRIAL FIBRILLATION ABLATION N/A 09/28/2017   Procedure: ATRIAL FIBRILLATION ABLATION;  Surgeon: [name], MD;  Location: [facility];  Service: Cardiovascular;  Laterality: N/A;   BACK SURGERY     x 3 - 2 rods and 8 screws   CARDIOVERSION  N/A 08/24/2017   Procedure: CARDIOVERSION;  Surgeon: Jerline Pain, MD;  Location: [facility];  Service: Cardiovascular;  Laterality: N/A;   CARDIOVERSION N/A 10/19/2017   Procedure: CARDIOVERSION;  Surgeon: Josue Hector, MD;  Location: [facility] ENDOSCOPY;  Service: Cardiovascular;  Laterality: N/A;   CARDIOVERSION N/A 10/04/2019   Procedure: CARDIOVERSION;  Surgeon: Pixie Casino, MD;  Location: [facility];  Service: Cardiovascular;  Laterality: N/A;   CARDIOVERSION N/A 08/20/2021   Procedure: CARDIOVERSION;  Surgeon: Jerline Pain, MD;  Location: [facility] ENDOSCOPY;  Service: Cardiovascular;  Laterality: N/A;   CARDIOVERSION N/A 07/02/2022   Procedure: CARDIOVERSION;  Surgeon: Geralynn Rile, MD;  Location: [facility];  Service: Cardiovascular;  Laterality: N/A;   CARDIOVERSION N/A 10/01/2022   Procedure: CARDIOVERSION;  Surgeon: Jerline Pain, MD;  Location: [facility];  Service: Cardiovascular;  Laterality: N/A;   COLONOSCOPY     CYSTOCELE REPAIR     DENTAL SURGERY     infected tooth - general   DILATION AND CURETTAGE OF UTERUS     x several   ELECTROPHYSIOLOGIC STUDY N/A 11/28/2015   Procedure: Atrial Fibrillation Ablation;  Surgeon: [name], MD;  Location: [facility];  Service: Cardiovascular;  Laterality: N/A;   EP IMPLANTABLE DEVICE N/A 06/08/2016   Procedure: Loop Recorder Insertion;   Surgeon: [name], MD;  Location: [facility];  Service: Cardiovascular;  Laterality: N/A;   EXAMINATION UNDER ANESTHESIA N/A 01/15/2014   Procedure: EXAM UNDER ANESTHESIA with evacuation of hematoma and over sewing of vaginal mucosa.;  Surgeon: Floyce Stakes. Pamala Hurry, MD;  Location: [facility];  Service: Gynecology;  Laterality: N/A;   HAND SURGERY     left   hemorrhoid injection  04/2011   implantable loop recorder removal  08/06/2021   MDT Reveal LINQ removed by Dr Rayann Heman   JOINT REPLACEMENT     left thumb replacement   MYRINGOTOMY WITH TUBE PLACEMENT Right 10/27/2018   TEE WITHOUT CARDIOVERSION N/A 09/27/2017   Procedure: TRANSESOPHAGEAL ECHOCARDIOGRAM (TEE);  Surgeon: Sueanne Margarita, MD;  Location: [facility] ENDOSCOPY;  Service: Cardiovascular;  Laterality: N/A;   TONSILLECTOMY       Current Outpatient Medications  Medication Sig Dispense Refill   acetaminophen (TYLENOL) 500 MG tablet Take 500-1,000 mg by mouth every 6 (six) hours as needed for moderate pain or headache.     apixaban (ELIQUIS) 5 MG TABS tablet TAKE 1 TABLET BY MOUTH TWICE A DAY 180 tablet 1   Biotin (BIOTIN 5000) 5 MG CAPS Take 5 mg by mouth daily with lunch.     Calcium Carb-Cholecalciferol (CALCIUM 600 + D PO) Take 1 tablet by mouth daily with lunch. 600 mg / 20 mcg     diclofenac Sodium (VOLTAREN) 1 % GEL Apply 1 Application topically as needed (pain).     diltiazem (CARDIZEM CD) 300 MG 24 hr capsule Take 300 mg by mouth daily.     diltiazem (CARDIZEM) 30 MG tablet TAKE 1 TABLET BY MOUTH EVERY 4 HOURS AS NEEDED (FOR FAST HEART RATE > 100 AS LONG AS BP > 100). 135 tablet 1   famotidine-calcium carbonate-magnesium hydroxide (PEPCID COMPLETE) 10-800-165 MG chewable tablet Chew 1 tablet by mouth daily as needed (acid reflux).     levothyroxine (SYNTHROID, LEVOTHROID) 75 MCG tablet Take 75 mcg by mouth daily before breakfast.     PARoxetine (PAXIL) 30 MG tablet Take 30 mg by mouth daily.     Probiotic Product (PROBIOTIC  PO) Take 1 capsule by mouth  daily with lunch.     ramipril (ALTACE) 5 MG capsule Take 1 capsule (5 mg total) by mouth 2 (two) times daily. 180 capsule 3   rosuvastatin (CRESTOR) 10 MG tablet TAKE 1 TABLET BY MOUTH EVERY DAY 90 tablet 3   vitamin B-12 (CYANOCOBALAMIN) 1000 MCG tablet Take 1,000 mcg by mouth daily with lunch.     Wheat Dextrin (BENEFIBER) POWD Take 1 Scoop by mouth daily.     amiodarone (PACERONE) 200 MG tablet Take 1 tablet (200 mg total) by mouth daily. 90 tablet 1   No current facility-administered medications for this encounter.    Allergies:   Pneumovax 23 [pneumococcal vac polyvalent], Codeine, Cephalexin, Ciprofloxacin, Sudafed [pseudoephedrine hcl], and Sulfa antibiotics   Social History:  The patient  reports that she has never smoked. She has been exposed to tobacco smoke. She has never used smokeless tobacco. She reports that she does not drink alcohol and does not use drugs.   Family History:  The patient's  family history includes Diabetes in her father; Heart attack in her mother and sister; Heart disease in her brother, mother, and sister; Heart failure in her brother; Hypertension in her father and sister; Stroke in her father; Sudden death in her maternal grandfather.    ROS:  Please see the history of present illness.   All other systems are personally reviewed and negative.   Exam: GEN- The patient is well appearing, alert and oriented x 3 today.   Head- normocephalic, atraumatic Eyes-  Sclera clear, conjunctiva pink Ears- hearing intact Oropharynx- clear Neck- supple, no JVP Lymph- no cervical lymphadenopathy Lungs- Clear to ausculation bilaterally, normal work of breathing Heart- regular rate and rhythm, no murmurs, rubs or gallops, PMI not laterally displaced GI- soft, NT, ND, + BS Extremities- no clubbing, cyanosis, or edema MS- no significant deformity or atrophy Skin- no rash or lesion Psych- euthymic mood, full affect Neuro- strength and  sensation are intact    PYP:PJKD. rate 63 BPM PR interval 248 ms QRS duration 90 ms QT/QTcB 468/478 ms P-R-T axes 74 -44 63 Sinus rhythm with 1st degree A-V block Left axis deviation Septal infarct , age undetermined Abnormal ECG When compared with ECG of 01-Oct-2022 12:43, PREVIOUS ECG IS PRESENT   ASSESSMENT AND PLAN:  1.Persisitent symptomatic atrial fibrillation/flutter Failed Tikosyn for prolonged qt Started on amiodarone 200 mg bid mid December  Cardioversion scheduled for 10/01/22 but cancelled as she was in SR and remains in SR Reduce amiodarone to 200 mg daily from  bid as I feel some of her current symptoms will improve with dose reduction  Continue Cardizem at  300 mg daily No missed anticoagulation for at least 3 weeks, continue eliquis 5 mg bid  Echo     This patients CHA2DS2-VASc Score and unadjusted Ischemic Stroke Rate (% per year) is equal to 4.8 % stroke rate/year from a score of 4  Above score calculated as 1 point each if present [CHF, HTN, DM, Vascular=MI/PAD/Aortic Plaque, Age if 65-74, or Female] Above score calculated as 2 points each if present [Age > 75, or Stroke/TIA/TE]   F/u in afib clinic in one month with cmet/tsh at that time   F/u with Dr. Marlou Porch as scheduled 6/4    Signed, Geroge Baseman. Darcee Dekker, [facility] [address] [place], [place] [address] [phone]

## 2022-10-08 NOTE — Patient Instructions (Signed)
Decrease amiodarone '200mg'$  once a day

## 2022-10-15 ENCOUNTER — Encounter (HOSPITAL_COMMUNITY): Payer: Self-pay | Admitting: *Deleted

## 2022-10-23 DIAGNOSIS — K629 Disease of anus and rectum, unspecified: Secondary | ICD-10-CM | POA: Diagnosis not present

## 2022-10-23 DIAGNOSIS — R300 Dysuria: Secondary | ICD-10-CM | POA: Diagnosis not present

## 2022-10-30 ENCOUNTER — Ambulatory Visit (HOSPITAL_COMMUNITY)
Admission: RE | Admit: 2022-10-30 | Discharge: 2022-10-30 | Disposition: A | Discharge status: Home / Self Care | Payer: Medicare Other | Source: Ambulatory Visit | Attending: Nurse Practitioner | Admitting: Nurse Practitioner

## 2022-10-30 DIAGNOSIS — I10 Essential (primary) hypertension: Secondary | ICD-10-CM | POA: Insufficient documentation

## 2022-10-30 DIAGNOSIS — I480 Paroxysmal atrial fibrillation: Secondary | ICD-10-CM | POA: Diagnosis not present

## 2022-10-30 DIAGNOSIS — I4892 Unspecified atrial flutter: Secondary | ICD-10-CM | POA: Insufficient documentation

## 2022-10-30 DIAGNOSIS — I2510 Atherosclerotic heart disease of native coronary artery without angina pectoris: Secondary | ICD-10-CM | POA: Diagnosis not present

## 2022-10-30 DIAGNOSIS — I4891 Unspecified atrial fibrillation: Secondary | ICD-10-CM

## 2022-10-31 LAB — ECHOCARDIOGRAM COMPLETE
Area-P 1/2: 2.78 cm2
S' Lateral: 2.3 cm

## 2022-11-04 ENCOUNTER — Ambulatory Visit (HOSPITAL_COMMUNITY)
Admission: RE | Admit: 2022-11-04 | Discharge: 2022-11-04 | Disposition: A | Discharge status: Home / Self Care | Payer: Medicare Other | Source: Ambulatory Visit | Attending: Nurse Practitioner | Admitting: Nurse Practitioner

## 2022-11-04 VITALS — BP 168/76 | HR 66 | Ht 64.0 in | Wt 184.6 lb

## 2022-11-04 DIAGNOSIS — I4819 Other persistent atrial fibrillation: Secondary | ICD-10-CM | POA: Insufficient documentation

## 2022-11-04 DIAGNOSIS — Z7901 Long term (current) use of anticoagulants: Secondary | ICD-10-CM | POA: Insufficient documentation

## 2022-11-04 DIAGNOSIS — R9431 Abnormal electrocardiogram [ECG] [EKG]: Secondary | ICD-10-CM | POA: Diagnosis not present

## 2022-11-04 DIAGNOSIS — Z79899 Other long term (current) drug therapy: Secondary | ICD-10-CM | POA: Diagnosis not present

## 2022-11-04 DIAGNOSIS — I10 Essential (primary) hypertension: Secondary | ICD-10-CM | POA: Diagnosis not present

## 2022-11-04 DIAGNOSIS — I4892 Unspecified atrial flutter: Secondary | ICD-10-CM | POA: Diagnosis not present

## 2022-11-04 DIAGNOSIS — I4891 Unspecified atrial fibrillation: Secondary | ICD-10-CM

## 2022-11-04 LAB — COMPREHENSIVE METABOLIC PANEL
ALT: 17 U/L (ref 0–44)
AST: 25 U/L (ref 15–41)
Albumin: 3.8 g/dL (ref 3.5–5.0)
Alkaline Phosphatase: 61 U/L (ref 38–126)
Anion gap: 10 (ref 5–15)
BUN: 12 mg/dL (ref 8–23)
CO2: 28 mmol/L (ref 22–32)
Calcium: 9.4 mg/dL (ref 8.9–10.3)
Chloride: 102 mmol/L (ref 98–111)
Creatinine, Ser: 1.02 mg/dL — ABNORMAL HIGH (ref 0.44–1.00)
GFR, Estimated: 54 mL/min — ABNORMAL LOW (ref >60)
Glucose, Bld: 97 mg/dL (ref 70–99)
Potassium: 4.2 mmol/L (ref 3.5–5.1)
Sodium: 140 mmol/L (ref 135–145)
Total Bilirubin: 0.4 mg/dL (ref 0.3–1.2)
Total Protein: 6.6 g/dL (ref 6.5–8.1)

## 2022-11-04 LAB — TSH: TSH: 3.189 u[IU]/mL (ref 0.350–4.500)

## 2022-11-04 MED ORDER — RAMIPRIL 5 MG PO CAPS: ORAL_CAPSULE | ORAL | 3 refills | Status: DC

## 2022-11-04 MED ORDER — AMIODARONE HCL 200 MG PO TABS: 100.0000 mg | ORAL_TABLET | Freq: Every day | ORAL | 1 refills | Status: DC

## 2022-11-04 NOTE — Progress Notes (Signed)
Date:  11/04/2022   ID:  Frances [name], DOB 05-24-1934, MRN [id number]  Location: Office  Provider location: [place], [place] [address] Evaluation Performed: f/u persistent afib  PCP:  Charlane Ferretti, MD  Primary Cardiologist:   Dr. Marlou Porch  Primary Electrophysiologist:  Dr. Myles Gip   CC: persistent afib     History of Present Illness: Frances [name] is a 89 y.o. female who presents  for persistent afib since last week.  Pt with h/o of 2 prior ablation 2017 and most recent 09/2017 that has experienced persistent afib since last week. No known triggers. Her PCP recently increased cardizem for BP issues but she has not started. V rate 129 bpm today. She had several days of afib last May but converted on her own and DCCV was cancelled. Continues on eliquis with a CHA2DS2VASc score of 4.   F/u in afib clinic, 10/11/19, after successful cardioversion.  She remains in SR. She feels improved. Has noted mild ankle edema since increasing cardizem to 300 mg daily. Received her second covid shot this last week.   F/u in afib [facility] 05/27/20. She went into persistent afib one week ago. No change in health,  although she does tell me she loss her husband in April. Her adult son with Parkinson's is living with her since being dx'ed and his wife asking him for a divorce. She has both vacccines and no missed anticoagulation.   F/u in afib clinic 07/08/20. She spontaneously reverted to SR so cardioversion was cancelled. She then had f/u with Dr. Marlou Porch  and was in afib. She was scheduled an appointment with Dr. Rayann Heman and he gave her several options but she wanted to revisit flecainide and was started on 50 mg bid. She is now in the office and is in SR with flecainide 50 mg bid.. She has a first degree block on flecainide similar  to EKG's on flecainide in the past. She feels well. No afib since start of flecainide.   F/u in afib clinic, 07/01/21. She  remains in SR. No issues with  afib. Continues on flecainide 50 mg bid. No issues with Eliquis 5 mg bid for a CHA2DS2VASc  score of 5.   F/u afib clinic, 08/27/21. Pt was seen by Dr. Rayann Heman 10/25 and was found to be in atypical atrial flutter for around 2 weeks. She was set up for cardioversion. Her loop monitor was explanted at that visit. Cardioversion 08/20/21 was successful. She was told if ERAF then he would consider another ablation.   F/u in afib clinic, 02/17/22. She reports that she is doing well, no afib to report.   F/u in afib clinic 03/04/22 as she felt she went into afib a few days earlier. EKG today shows atrial flutter  at 110 bpm. Out of rhythm since last Tuesday. No know trigger.   Pt was seen back for  EKG prior to cardioversion early July as she spontaneously converted, DCCV cancelled.   She is now in the afib clinic, 10/23. She has been out of rhythm x 2 weeks. Despite one day of extra flecainide and taking 30 mg Cardizem as needed. She is in atypical atrial flutter  today with v rates around 100 bpm. We discussed change of antiarrythmic and would like  to  discuss elective admit for Tikosyn after she is cardioverted back to SR. She would prefer not to  use amiodarone. No missed anticoagulation for the last 21  days.   07/10/22. She is here to f/u successful cardioversion. Unfortunately, she has had return of arrhythmia. It appears to be an atypical atrial flutter at 106 bpm. We discussed use of Tikosyn in the past but she would refer to discuss with a  EP first, she is waiting to get an appointment to reestablish  with EP in Dr. Jackalyn Lombard absence to discuss.  F/u in afib clinic, 08/18/22. She is here for Tikosyn admission. No missed anticoagulation. No benadryl use. Labs drawn. Qt c is 446. She is in rate controlled a flutter today. Price of drug discussed. Off flecainide  for several weeks.   F/u in afib clinic, 09/16/21. She failed Tikosyn with prolonged qt so load was aborted and decision was made to place pt on  amiodarone. By time of cardioversion (several weeks out) she will have completed her month on amiodarone. She has not missed any doses of anticoagulation for at least 21 days and is tolerating amiodarone well. She continues on 200 mg amiodarone bid and will stay on this dose until I see back one week s/p cardioversion.   F/u in afib clinic,10/08/22. She did not require cardioversion as she had converted with amiodarone load. She remains in SR. She does feel fatigued and short of breath. Hopefully reducing amio to 200 mg daily today  will improve these symptoms, but since last echo was in 2019, I will update this.  F/u in afib clinic, 11/04/22. She is still staying in SR but still feels fatigued that she contributes to amiodarone. She has also noted mild tremors of hands. She also feels her BP is not as well controlled sine her ramipril was decreased during  the admit for tikosyn. I could not find documentation of why the reduction unless it was trying to get as much Tikosyn on board as possible with cr cl issues.     Today, she denies symptoms of palpitations, chest pain, shortness of breath, orthopnea, PND, lower extremity edema, claudication, dizziness, presyncope, syncope, bleeding, or neurologic sequela. The patient is tolerating medications without difficulties and is otherwise without complaint today.   she denies symptoms of cough, fevers, chills, or new SOB worrisome for COVID 19.    she has a BMI of Body mass index is 31.69 kg/m.Marland Kitchen Filed Weights   11/04/22 1325  Weight: 83.7 kg      Past Medical History:  Diagnosis Date   Arthritis    hands, back   Asthmatic bronchitis    Atrial fibrillation ([facility]) 07/31/2013   Chronic anticoagulation 07/30/2014   Chronic kidney disease    stage 3 renal disease - no med   Dizziness 07/30/2014   Dysrhythmia    Hx - a-fib 05/2010 - tx with meds, no problem since 05/2010   First degree AV block 07/30/2014   Hearing loss    bilateral hearing aids    Hypertension    controlled with meds   Hypothyroidism    Internal hemorrhoid    PAF (paroxysmal atrial fibrillation) ([facility]) 11/28/2015   Paroxysmal atrial fibrillation ([facility])    Peripheral vascular disease ([facility])    right arm and right shoulder blood clots r/t a fall   Persistent atrial fibrillation ([facility]) 09/28/2017   Rectal bleeding    SVD (spontaneous vaginal delivery) 1957, 1959   x 2   Thyroid disease    hypothyroidism   Past Surgical History:  Procedure Laterality Date   ABDOMINAL HYSTERECTOMY     and right ovary   ANTERIOR AND POSTERIOR REPAIR  N/A 01/03/2014   Procedure: ANTERIOR (CYSTOCELE) REPAIR;  Surgeon: Claiborne Billings A. Pamala Hurry, MD;  Location: [facility];  Service: Gynecology;  Laterality: N/A;   APPENDECTOMY     ATRIAL FIBRILLATION ABLATION N/A 09/28/2017   Procedure: ATRIAL FIBRILLATION ABLATION;  Surgeon: [name], MD;  Location: [facility];  Service: Cardiovascular;  Laterality: N/A;   BACK SURGERY     x 3 - 2 rods and 8 screws   CARDIOVERSION N/A 08/24/2017   Procedure: CARDIOVERSION;  Surgeon: Jerline Pain, MD;  Location: [facility];  Service: Cardiovascular;  Laterality: N/A;   CARDIOVERSION N/A 10/19/2017   Procedure: CARDIOVERSION;  Surgeon: Josue Hector, MD;  Location: [facility];  Service: Cardiovascular;  Laterality: N/A;   CARDIOVERSION N/A 10/04/2019   Procedure: CARDIOVERSION;  Surgeon: Pixie Casino, MD;  Location: [facility];  Service: Cardiovascular;  Laterality: N/A;   CARDIOVERSION N/A 08/20/2021   Procedure: CARDIOVERSION;  Surgeon: Jerline Pain, MD;  Location: [facility];  Service: Cardiovascular;  Laterality: N/A;   CARDIOVERSION N/A 07/02/2022   Procedure: CARDIOVERSION;  Surgeon: Geralynn Rile, MD;  Location: [facility];  Service: Cardiovascular;  Laterality: N/A;   CARDIOVERSION N/A 10/01/2022   Procedure: CARDIOVERSION;  Surgeon: Jerline Pain, MD;  Location: [facility];  Service: Cardiovascular;  Laterality: N/A;    COLONOSCOPY     CYSTOCELE REPAIR     DENTAL SURGERY     infected tooth - general   DILATION AND CURETTAGE OF UTERUS     x several   ELECTROPHYSIOLOGIC STUDY N/A 11/28/2015   Procedure: Atrial Fibrillation Ablation;  Surgeon: [name], MD;  Location: [facility];  Service: Cardiovascular;  Laterality: N/A;   EP IMPLANTABLE DEVICE N/A 06/08/2016   Procedure: Loop Recorder Insertion;  Surgeon: [name], MD;  Location: [facility];  Service: Cardiovascular;  Laterality: N/A;   EXAMINATION UNDER ANESTHESIA N/A 01/15/2014   Procedure: EXAM UNDER ANESTHESIA with evacuation of hematoma and over sewing of vaginal mucosa.;  Surgeon: Floyce Stakes. Pamala Hurry, MD;  Location: [facility];  Service: Gynecology;  Laterality: N/A;   HAND SURGERY     left   hemorrhoid injection  04/2011   implantable loop recorder removal  08/06/2021   MDT Reveal LINQ removed by Dr Rayann Heman   JOINT REPLACEMENT     left thumb replacement   MYRINGOTOMY WITH TUBE PLACEMENT Right 10/27/2018   TEE WITHOUT CARDIOVERSION N/A 09/27/2017   Procedure: TRANSESOPHAGEAL ECHOCARDIOGRAM (TEE);  Surgeon: Sueanne Margarita, MD;  Location: [facility] ENDOSCOPY;  Service: Cardiovascular;  Laterality: N/A;   TONSILLECTOMY       Current Outpatient Medications  Medication Sig Dispense Refill   acetaminophen (TYLENOL) 500 MG tablet Take 500-1,000 mg by mouth as needed for moderate pain or headache.     apixaban (ELIQUIS) 5 MG TABS tablet TAKE 1 TABLET BY MOUTH TWICE A DAY 180 tablet 1   Biotin (BIOTIN 5000) 5 MG CAPS Take 5 mg by mouth daily with lunch.     Calcium Carb-Cholecalciferol (CALCIUM 600 + D PO) Take 1 tablet by mouth daily with lunch. 600 mg / 20 mcg     diclofenac Sodium (VOLTAREN) 1 % GEL Apply 1 Application topically as needed (pain).     diltiazem (CARDIZEM CD) 300 MG 24 hr capsule Take 300 mg by mouth daily.     diltiazem (CARDIZEM) 30 MG tablet TAKE 1 TABLET BY MOUTH EVERY 4 HOURS AS NEEDED (FOR FAST HEART RATE > 100 AS  LONG  AS BP > 100). 135 tablet 1   famotidine-calcium carbonate-magnesium hydroxide (PEPCID COMPLETE) 10-800-165 MG chewable tablet Chew 1 tablet by mouth daily as needed (acid reflux).     fluticasone (FLONASE) 50 MCG/ACT nasal spray Place 1 spray into both nostrils as needed.     levothyroxine (SYNTHROID, LEVOTHROID) 75 MCG tablet Take 75 mcg by mouth daily before breakfast.     PARoxetine (PAXIL) 30 MG tablet Take 30 mg by mouth daily.     Probiotic Product (PROBIOTIC PO) Take 1 capsule by mouth daily with lunch.     rosuvastatin (CRESTOR) 10 MG tablet TAKE 1 TABLET BY MOUTH EVERY DAY 90 tablet 3   vitamin B-12 (CYANOCOBALAMIN) 1000 MCG tablet Take 1,000 mcg by mouth daily with lunch.     Wheat Dextrin (BENEFIBER) POWD Take 1 Scoop by mouth daily.     amiodarone (PACERONE) 200 MG tablet Take 0.5 tablets (100 mg total) by mouth daily. 90 tablet 1   ramipril (ALTACE) 5 MG capsule Take '10mg'$  in the AM and '5mg'$  in the PM 180 capsule 3   No current facility-administered medications for this encounter.    Allergies:   Pneumovax 23 [pneumococcal vac polyvalent], Codeine, Cephalexin, Ciprofloxacin, Sudafed [pseudoephedrine hcl], and Sulfa antibiotics   Social History:  The patient  reports that she has never smoked. She has been exposed to tobacco smoke. She has never used smokeless tobacco. She reports that she does not drink alcohol and does not use drugs.   Family History:  The patient's  family history includes Diabetes in her father; Heart attack in her mother and sister; Heart disease in her brother, mother, and sister; Heart failure in her brother; Hypertension in her father and sister; Stroke in her father; Sudden death in her maternal grandfather.    ROS:  Please see the history of present illness.   All other systems are personally reviewed and negative.   Exam: GEN- The patient is well appearing, alert and oriented x 3 today.   Head- normocephalic, atraumatic Eyes-  Sclera clear,  conjunctiva pink Ears- hearing intact Oropharynx- clear Neck- supple, no JVP Lymph- no cervical lymphadenopathy Lungs- Clear to ausculation bilaterally, normal work of breathing Heart- regular rate and rhythm, no murmurs, rubs or gallops, PMI not laterally displaced GI- soft, NT, ND, + BS Extremities- no clubbing, cyanosis, or edema MS- no significant deformity or atrophy Skin- no rash or lesion Psych- euthymic mood, full affect Neuro- strength and sensation are intact    EKG: Vent. rate 66 BPM PR interval 242 ms QRS duration 88 ms QT/QTcB 456/478 ms P-R-T axes 79 -70 77 Sinus rhythm with 1st degree A-V block Left axis deviation Septal infarct , age undetermined Abnormal ECG When compared with ECG of 08-Oct-2022 14:24, nscl Confirmed by Gwyndolyn Kaufman [phone]) on 11/04/2022 2:40:12 PM   ASSESSMENT AND PLAN:  1.Persisitent symptomatic atrial fibrillation/flutter Failed Tikosyn for prolonged qt Started on amiodarone 200 mg bid mid December  Cardioversion scheduled for 10/01/22 but cancelled as she was in SR and remains in SR Reduced amiodarone to 200 mg daily from  bid as she was fatigued  Today, fatigue is improved but still has ome ffaatigue and mild tremor to hands  Will try to reduce amio again to 200 mg 1/2 tab daily, warned the more the dose is reduced, afib could breakthrough  Continue Cardizem at  300 mg daily Continue  eliquis 5 mg bid  Echo done and stable, reviewed with pt.     This patients  CHA2DS2-VASc Score and unadjusted Ischemic Stroke Rate (% per year) is equal to 4.8 % stroke rate/year from a score of 4  Above score calculated as 1 point each if present [CHF, HTN, DM, Vascular=MI/PAD/Aortic Plaque, Age if 65-74, or Female] Above score calculated as 2 points each if present [Age > 75, or Stroke/TIA/TE]    2, HTN Elevated readings  Will try to increase ramipril to 10 mg am and 5 mg pm  Bmet in 10 days after increase If this dose does not control  BP's, and kidney function stable, then increase both am and pm doses to 10 mg bid   F/u with Dr. Marlou Porch  in June, [facility] 6 months  after that      Signed, Geroge Baseman. [place], [facility] [address] [place], [place] [address] [phone]

## 2022-11-04 NOTE — Patient Instructions (Signed)
Decrease amiodarone to '100mg'$  a day (1/2 of the '200mg'$  tablet)   Increase ramipril to '10mg'$  in the morning and '5mg'$  in the evening

## 2022-11-05 ENCOUNTER — Other Ambulatory Visit (HOSPITAL_COMMUNITY): Payer: Self-pay | Admitting: Cardiology

## 2022-11-05 DIAGNOSIS — I4819 Other persistent atrial fibrillation: Secondary | ICD-10-CM

## 2022-11-05 NOTE — Telephone Encounter (Signed)
Prescription refill request for Eliquis received. Indication: Afib  Last office visit: 11/04/22 Frances [name])  Scr: 1.02 (11/04/22)  Age: 89 Weight: 83.7kg  Appropriate dose. Refill sent.

## 2022-11-13 ENCOUNTER — Ambulatory Visit (HOSPITAL_COMMUNITY)
Admission: RE | Admit: 2022-11-13 | Discharge: 2022-11-13 | Disposition: A | Discharge status: Home / Self Care | Payer: Medicare Other | Source: Ambulatory Visit | Attending: Physician Assistant | Admitting: Physician Assistant

## 2022-11-13 DIAGNOSIS — I4819 Other persistent atrial fibrillation: Secondary | ICD-10-CM | POA: Insufficient documentation

## 2022-11-13 LAB — BASIC METABOLIC PANEL
Anion gap: 12 (ref 5–15)
BUN: 13 mg/dL (ref 8–23)
CO2: 23 mmol/L (ref 22–32)
Calcium: 9.5 mg/dL (ref 8.9–10.3)
Chloride: 104 mmol/L (ref 98–111)
Creatinine, Ser: 1.04 mg/dL — ABNORMAL HIGH (ref 0.44–1.00)
GFR, Estimated: 52 mL/min — ABNORMAL LOW (ref >60)
Glucose, Bld: 94 mg/dL (ref 70–99)
Potassium: 4 mmol/L (ref 3.5–5.1)
Sodium: 139 mmol/L (ref 135–145)

## 2022-11-20 ENCOUNTER — Telehealth (HOSPITAL_COMMUNITY): Payer: Self-pay | Admitting: *Deleted

## 2022-11-20 MED ORDER — RAMIPRIL 10 MG PO CAPS: 10.0000 mg | ORAL_CAPSULE | Freq: Two times a day (BID) | ORAL | Status: DC

## 2022-11-20 NOTE — Telephone Encounter (Signed)
Pt BP continues being elevated 149-161/73-83 - per Adline Peals PA will increase ramipril to 10mg  BID. BMET in 3-4 weeks. Pt in agreement.

## 2022-12-14 ENCOUNTER — Ambulatory Visit (HOSPITAL_COMMUNITY)
Admission: RE | Admit: 2022-12-14 | Discharge: 2022-12-14 | Disposition: A | Discharge status: Home / Self Care | Payer: Medicare Other | Source: Ambulatory Visit | Attending: Physician Assistant | Admitting: Physician Assistant

## 2022-12-14 DIAGNOSIS — I4819 Other persistent atrial fibrillation: Secondary | ICD-10-CM | POA: Diagnosis not present

## 2022-12-14 LAB — BASIC METABOLIC PANEL
Anion gap: 10 (ref 5–15)
BUN: 15 mg/dL (ref 8–23)
CO2: 23 mmol/L (ref 22–32)
Calcium: 9.7 mg/dL (ref 8.9–10.3)
Chloride: 104 mmol/L (ref 98–111)
Creatinine, Ser: 1.15 mg/dL — ABNORMAL HIGH (ref 0.44–1.00)
GFR, Estimated: 46 mL/min — ABNORMAL LOW (ref >60)
Glucose, Bld: 96 mg/dL (ref 70–99)
Potassium: 4.1 mmol/L (ref 3.5–5.1)
Sodium: 137 mmol/L (ref 135–145)

## 2022-12-18 DIAGNOSIS — I129 Hypertensive chronic kidney disease with stage 1 through stage 4 chronic kidney disease, or unspecified chronic kidney disease: Secondary | ICD-10-CM | POA: Diagnosis not present

## 2023-01-04 DIAGNOSIS — I129 Hypertensive chronic kidney disease with stage 1 through stage 4 chronic kidney disease, or unspecified chronic kidney disease: Secondary | ICD-10-CM | POA: Diagnosis not present

## 2023-01-04 DIAGNOSIS — R300 Dysuria: Secondary | ICD-10-CM | POA: Diagnosis not present

## 2023-01-13 DIAGNOSIS — H6121 Impacted cerumen, right ear: Secondary | ICD-10-CM | POA: Diagnosis not present

## 2023-02-03 DIAGNOSIS — I480 Paroxysmal atrial fibrillation: Secondary | ICD-10-CM | POA: Diagnosis not present

## 2023-02-03 DIAGNOSIS — Z79899 Other long term (current) drug therapy: Secondary | ICD-10-CM | POA: Diagnosis not present

## 2023-02-03 DIAGNOSIS — I129 Hypertensive chronic kidney disease with stage 1 through stage 4 chronic kidney disease, or unspecified chronic kidney disease: Secondary | ICD-10-CM | POA: Diagnosis not present

## 2023-02-03 DIAGNOSIS — I700 Atherosclerosis of aorta: Secondary | ICD-10-CM | POA: Diagnosis not present

## 2023-02-09 ENCOUNTER — Encounter: Payer: Self-pay | Admitting: Cardiology

## 2023-02-09 ENCOUNTER — Ambulatory Visit: Payer: Medicare Other | Attending: Cardiology | Admitting: Cardiology

## 2023-02-09 VITALS — BP 142/70 | HR 68 | Ht 64.0 in | Wt 182.8 lb

## 2023-02-09 DIAGNOSIS — D6869 Other thrombophilia: Secondary | ICD-10-CM | POA: Diagnosis not present

## 2023-02-09 DIAGNOSIS — I4819 Other persistent atrial fibrillation: Secondary | ICD-10-CM | POA: Insufficient documentation

## 2023-02-09 DIAGNOSIS — I10 Essential (primary) hypertension: Secondary | ICD-10-CM

## 2023-02-09 NOTE — Progress Notes (Signed)
Cardiology Office Note:    Date:  02/09/2023   ID:  Frances [name], DOB 01-17-1934, MRN [id number]  PCP:  Thana Ates, MD   [facility] Providers Cardiologist:  Donato Schultz, MD Electrophysiologist:  Maurice Small, MD     Referring MD: Thana Ates, MD    History of Present Illness:    Frances [name] is a 89 y.o. female here for the follow-up of persistent atrial fibrillation. Atrial fibrillation ablation 2017 and 2019. Previously converted on her own back in 2021  In October 2022 was found to be in an atypical flutter for about 2 weeks.  Loop monitor was explanted and she was set up for cardioversion in December 2022.  Successful.  She was told that if flutter or fib return that we could consider another ablation.  In 2023 overall doing well without any A-fib until flutter came about in June 2023 at 110 bpm.  She spontaneously converted back to sinus rhythm and cardioversion was canceled.  After going back into flutter, we tried Tikosyn admission but she failed secondary to QT.  Decision was made to place on amiodarone.  She converted chemically.  Thought that she had some fatigue secondary to Amio.  Mild tremors in her hands.  Past Medical History:  Diagnosis Date   Arthritis    hands, back   Asthmatic bronchitis    Atrial fibrillation (HCC) 07/31/2013   Chronic anticoagulation 07/30/2014   Chronic kidney disease    stage 3 renal disease - no med   Dizziness 07/30/2014   Dysrhythmia    Hx - a-fib 05/2010 - tx with meds, no problem since 05/2010   First degree AV block 07/30/2014   Hearing loss    bilateral hearing aids   Hypertension    controlled with meds   Hypothyroidism    Internal hemorrhoid    PAF (paroxysmal atrial fibrillation) (HCC) 11/28/2015   Paroxysmal atrial fibrillation (HCC)    Peripheral vascular disease (HCC)    right arm and right shoulder blood clots r/t a fall   Persistent atrial fibrillation (HCC) 09/28/2017   Rectal  bleeding    SVD (spontaneous vaginal delivery) 1957, 1959   x 2   Thyroid disease    hypothyroidism    Past Surgical History:  Procedure Laterality Date   ABDOMINAL HYSTERECTOMY     and right ovary   ANTERIOR AND POSTERIOR REPAIR N/A 01/03/2014   Procedure: ANTERIOR (CYSTOCELE) REPAIR;  Surgeon: Tresa Endo A. Ernestina Penna, MD;  Location: [facility];  Service: Gynecology;  Laterality: N/A;   APPENDECTOMY     ATRIAL FIBRILLATION ABLATION N/A 09/28/2017   Procedure: ATRIAL FIBRILLATION ABLATION;  Surgeon: Hillis Range, MD;  Location: [facility] INVASIVE CV LAB;  Service: Cardiovascular;  Laterality: N/A;   BACK SURGERY     x 3 - 2 rods and 8 screws   CARDIOVERSION N/A 08/24/2017   Procedure: CARDIOVERSION;  Surgeon: Jake Bathe, MD;  Location: [facility] ENDOSCOPY;  Service: Cardiovascular;  Laterality: N/A;   CARDIOVERSION N/A 10/19/2017   Procedure: CARDIOVERSION;  Surgeon: Wendall Stade, MD;  Location: [facility] ENDOSCOPY;  Service: Cardiovascular;  Laterality: N/A;   CARDIOVERSION N/A 10/04/2019   Procedure: CARDIOVERSION;  Surgeon: Chrystie Nose, MD;  Location: [facility] ENDOSCOPY;  Service: Cardiovascular;  Laterality: N/A;   CARDIOVERSION N/A 08/20/2021   Procedure: CARDIOVERSION;  Surgeon: Jake Bathe, MD;  Location: [facility] ENDOSCOPY;  Service: Cardiovascular;  Laterality: N/A;   CARDIOVERSION N/A 07/02/2022   Procedure: CARDIOVERSION;  Surgeon: Sande Rives, MD;  Location: [facility] ENDOSCOPY;  Service: Cardiovascular;  Laterality: N/A;   CARDIOVERSION N/A 10/01/2022   Procedure: CARDIOVERSION;  Surgeon: Jake Bathe, MD;  Location: [facility] ENDOSCOPY;  Service: Cardiovascular;  Laterality: N/A;   COLONOSCOPY     CYSTOCELE REPAIR     DENTAL SURGERY     infected tooth - general   DILATION AND CURETTAGE OF UTERUS     x several   ELECTROPHYSIOLOGIC STUDY N/A 11/28/2015   Procedure: Atrial Fibrillation Ablation;  Surgeon: Hillis Range, MD;  Location: [facility] INVASIVE CV LAB;  Service: Cardiovascular;  Laterality: N/A;    EP IMPLANTABLE DEVICE N/A 06/08/2016   Procedure: Loop Recorder Insertion;  Surgeon: Hillis Range, MD;  Location: [facility] INVASIVE CV LAB;  Service: Cardiovascular;  Laterality: N/A;   EXAMINATION UNDER ANESTHESIA N/A 01/15/2014   Procedure: EXAM UNDER ANESTHESIA with evacuation of hematoma and over sewing of vaginal mucosa.;  Surgeon: Alphonsus Sias. Ernestina Penna, MD;  Location: [facility];  Service: Gynecology;  Laterality: N/A;   HAND SURGERY     left   hemorrhoid injection  04/2011   implantable loop recorder removal  08/06/2021   MDT Reveal LINQ removed by Dr Johney Frame   JOINT REPLACEMENT     left thumb replacement   MYRINGOTOMY WITH TUBE PLACEMENT Right 10/27/2018   TEE WITHOUT CARDIOVERSION N/A 09/27/2017   Procedure: TRANSESOPHAGEAL ECHOCARDIOGRAM (TEE);  Surgeon: Quintella Reichert, MD;  Location: [facility] ENDOSCOPY;  Service: Cardiovascular;  Laterality: N/A;   TONSILLECTOMY      Current Medications: Current Meds  Medication Sig   acetaminophen (TYLENOL) 500 MG tablet Take 500-1,000 mg by mouth as needed for moderate pain or headache.   amiodarone (PACERONE) 200 MG tablet Take 0.5 tablets (100 mg total) by mouth daily.   amLODipine (NORVASC) 5 MG tablet Take 5 mg by mouth daily.   Biotin (BIOTIN 5000) 5 MG CAPS Take 5 mg by mouth daily with lunch.   Calcium Carb-Cholecalciferol (CALCIUM 600 + D PO) Take 1 tablet by mouth daily with lunch. 600 mg / 20 mcg   diclofenac Sodium (VOLTAREN) 1 % GEL Apply 1 Application topically as needed (pain).   diltiazem (CARDIZEM CD) 300 MG 24 hr capsule Take 300 mg by mouth daily.   diltiazem (CARDIZEM) 30 MG tablet TAKE 1 TABLET BY MOUTH EVERY 4 HOURS AS NEEDED (FOR FAST HEART RATE > 100 AS LONG AS BP > 100).   ELIQUIS 5 MG TABS tablet TAKE 1 TABLET BY MOUTH TWICE A DAY   famotidine-calcium carbonate-magnesium hydroxide (PEPCID COMPLETE) 10-800-165 MG chewable tablet Chew 1 tablet by mouth daily as needed (acid reflux).   fluticasone (FLONASE) 50 MCG/ACT nasal spray Place  1 spray into both nostrils as needed.   levothyroxine (SYNTHROID, LEVOTHROID) 75 MCG tablet Take 75 mcg by mouth daily before breakfast.   PARoxetine (PAXIL) 30 MG tablet Take 30 mg by mouth daily.   Probiotic Product (PROBIOTIC PO) Take 1 capsule by mouth daily with lunch.   ramipril (ALTACE) 10 MG capsule Take 1 capsule (10 mg total) by mouth 2 (two) times daily.   rosuvastatin (CRESTOR) 10 MG tablet TAKE 1 TABLET BY MOUTH EVERY DAY   vitamin B-12 (CYANOCOBALAMIN) 1000 MCG tablet Take 1,000 mcg by mouth daily with lunch.   Wheat Dextrin (BENEFIBER) POWD Take 1 Scoop by mouth daily.     Allergies:   Pneumovax 23 [pneumococcal vac polyvalent], Codeine, Cephalexin, Ciprofloxacin, Sudafed [pseudoephedrine hcl], and Sulfa antibiotics   Social History  Socioeconomic History   Marital status: Married    Spouse name: Not on file   Number of children: Not on file   Years of education: Not on file   Highest education level: Not on file  Occupational History   Not on file  Tobacco Use   Smoking status: Never    Passive exposure: Past   Smokeless tobacco: Never   Tobacco comments:    Never smoke 07/10/22  Vaping Use   Vaping Use: Never used  Substance and Sexual Activity   Alcohol use: No   Drug use: No   Sexual activity: Not Currently    Birth control/protection: Post-menopausal  Other Topics Concern   Not on file  Social History Narrative   Not on file   Social Determinants of Health   Financial Resource Strain: Not on file  Food Insecurity: No Food Insecurity (08/19/2022)   Hunger Vital Sign    Worried About Running Out of Food in the Last Year: Never true    Ran Out of Food in the Last Year: Never true  Transportation Needs: No Transportation Needs (08/19/2022)   PRAPARE - Administrator, Civil Service (Medical): No    Lack of Transportation (Non-Medical): No  Physical Activity: Not on file  Stress: Not on file  Social Connections: Not on file     Family  History: The patient's family history includes Diabetes in her father; Heart attack in her mother and sister; Heart disease in her brother, mother, and sister; Heart failure in her brother; Hypertension in her father and sister; Stroke in her father; Sudden death in her maternal grandfather.  ROS:   Please see the history of present illness.     All other systems reviewed and are negative.  EKGs/Labs/Other Studies Reviewed:    The following studies were reviewed today: Cardiac Studies & Procedures     STRESS TESTS  MYOCARDIAL PERFUSION IMAGING 01/13/2016  Narrative  Nuclear stress EF: 63%.  No T wave inversion was noted during stress.  There was no ST segment deviation noted during stress.  Defect 1: There is a medium defect of moderate severity.  Findings consistent with ischemia.  This is an intermediate risk study.  Moderate size and severity, partially reversible (SDS 5) inferior and inferoseptal perfusion defect suggestive of ischemia. There is underlying bowel artifact in this area, therefore attenuation artifact cannot be excluded. LVEF 63% with mild basal inferoseptal hypokinesis. This is an intermediate risk study.   ECHOCARDIOGRAM  ECHOCARDIOGRAM COMPLETE 10/31/2022  Narrative ECHOCARDIOGRAM REPORT    Patient Name:   ORRIS GRASSE Date of Exam: 10/30/2022 Medical Rec #:  [id number]           Height:       64.0 in Accession #:    [id number]          Weight:       185.4 lb Date of Birth:  October 01, 1933            BSA:          1.894 m Patient Age:    88 years            BP:           140/70 mmHg Patient Gender: F                   HR:           66 bpm. Exam Location:  Outpatient  Procedure: 2D Echo, 3D Echo,  Color Doppler, Cardiac Doppler and Strain Analysis  Indications:    I48.0 Paroxysmal atrial fibrillation  History:        Patient has prior history of Echocardiogram examinations, most recent 09/27/2017. CAD, Arrythmias:Atrial Fibrillation and  Atrial Flutter; Risk Factors:Hypertension and Non-Smoker.  Sonographer:    Jeryl Columbia RDCS Referring Phys: [phone] DONNA C CARROLL  IMPRESSIONS   1. Left ventricular ejection fraction, by estimation, is 65 to 70%. The left ventricle has normal function. The left ventricle has no regional wall motion abnormalities. There is mild left ventricular hypertrophy. Left ventricular diastolic parameters are consistent with Grade II diastolic dysfunction (pseudonormalization). The average left ventricular global longitudinal strain is -16.1 %. The global longitudinal strain is abnormal. 2. Right ventricular systolic function is normal. The right ventricular size is normal. There is normal pulmonary artery systolic pressure. 3. Left atrial size was mildly dilated. 4. The mitral valve is normal in structure. Trivial mitral valve regurgitation. No evidence of mitral stenosis. 5. The aortic valve is tricuspid. There is mild calcification of the aortic valve. There is mild thickening of the aortic valve. Aortic valve regurgitation is not visualized. No aortic stenosis is present. 6. The inferior vena cava is normal in size with greater than 50% respiratory variability, suggesting right atrial pressure of 3 mmHg.  FINDINGS Left Ventricle: Left ventricular ejection fraction, by estimation, is 65 to 70%. The left ventricle has normal function. The left ventricle has no regional wall motion abnormalities. The average left ventricular global longitudinal strain is -16.1 %. The global longitudinal strain is abnormal. The left ventricular internal cavity size was normal in size. There is mild left ventricular hypertrophy. Left ventricular diastolic parameters are consistent with Grade II diastolic dysfunction (pseudonormalization). Indeterminate filling pressures.  Right Ventricle: The right ventricular size is normal. No increase in right ventricular wall thickness. Right ventricular systolic function is  normal. There is normal pulmonary artery systolic pressure. The tricuspid regurgitant velocity is 2.58 m/s, and with an assumed right atrial pressure of 3 mmHg, the estimated right ventricular systolic pressure is 29.6 mmHg.  Left Atrium: Left atrial size was mildly dilated.  Right Atrium: Right atrial size was normal in size.  Pericardium: There is no evidence of pericardial effusion.  Mitral Valve: The mitral valve is normal in structure. Mild mitral annular calcification. Trivial mitral valve regurgitation. No evidence of mitral valve stenosis.  Tricuspid Valve: The tricuspid valve is normal in structure. Tricuspid valve regurgitation is trivial. No evidence of tricuspid stenosis.  Aortic Valve: The aortic valve is tricuspid. There is mild calcification of the aortic valve. There is mild thickening of the aortic valve. Aortic valve regurgitation is not visualized. No aortic stenosis is present.  Pulmonic Valve: The pulmonic valve was normal in structure. Pulmonic valve regurgitation is mild. No evidence of pulmonic stenosis.  Aorta: The aortic root is normal in size and structure.  Venous: The inferior vena cava is normal in size with greater than 50% respiratory variability, suggesting right atrial pressure of 3 mmHg.  IAS/Shunts: No atrial level shunt detected by color flow Doppler.   LEFT VENTRICLE PLAX 2D LVIDd:         3.90 cm   Diastology LVIDs:         2.30 cm   LV e' medial:    7.94 cm/s LV PW:         1.20 cm   LV E/e' medial:  9.9 LV IVS:        1.30 cm  LV e' lateral:   8.05 cm/s LVOT diam:     2.00 cm   LV E/e' lateral: 9.8 LV SV:         71 LV SV Index:   37        2D Longitudinal Strain LVOT Area:     3.14 cm  2D Strain GLS (A2C):   -13.7 % 2D Strain GLS (A3C):   -16.4 % 2D Strain GLS (A4C):   -18.2 % 2D Strain GLS Avg:     -16.1 %  3D Volume EF: 3D EF:        68 % LV EDV:       112 ml LV ESV:       36 ml LV SV:        76 ml  RIGHT VENTRICLE RV Basal  diam:  3.80 cm RV Mid diam:    2.50 cm RV S prime:     14.00 cm/s TAPSE (M-mode): 2.8 cm  LEFT ATRIUM             Index        RIGHT ATRIUM           Index LA diam:        5.20 cm 2.75 cm/m   RA Area:     14.40 cm LA Vol (A2C):   71.2 ml 37.59 ml/m  RA Volume:   37.60 ml  19.85 ml/m LA Vol (A4C):   30.3 ml 15.99 ml/m LA Biplane Vol: 49.8 ml 26.29 ml/m AORTIC VALVE LVOT Vmax:   93.80 cm/s LVOT Vmean:  65.500 cm/s LVOT VTI:    0.225 m  AORTA Ao Root diam: 3.00 cm Ao Asc diam:  3.40 cm  MITRAL VALVE               TRICUSPID VALVE MV Area (PHT): 2.78 cm    TR Peak grad:   26.6 mmHg MV Decel Time: 273 msec    TR Vmax:        258.00 cm/s MV E velocity: 78.50 cm/s MV A velocity: 59.50 cm/s  SHUNTS MV E/A ratio:  1.32        Systemic VTI:  0.22 m Systemic Diam: 2.00 cm  Chilton Si MD Electronically signed by Chilton Si MD Signature Date/Time: 10/31/2022/2:55:00 AM    Final   TEE  ECHO TEE 09/27/2017  Narrative *[place]* *[facility]* [address] [place], [place] [address] [phone]  ------------------------------------------------------------------- Transesophageal Echocardiography  Patient:    Ruthine, Zierden MR #:       [id number] Study Date: 09/27/2017 Gender:     F Age:        58 Height:     165.1 cm Weight:     81.8 kg BSA:        1.96 m^2 Pt. Status: Room:  ADMITTING    Armanda Magic, MD PERFORMING   Armanda Magic, MD ATTENDING    Blythe Stanford, Amber REFERRING    Webster, Amber SONOGRAPHER  Sinda Du, RDCS  cc:  ------------------------------------------------------------------- LV EF: 55% -   60%  ------------------------------------------------------------------- History:   PMH:  Cardiac Ablation.  Atrial fibrillation.  Risk factors:  Hypertension.  ------------------------------------------------------------------- Study Conclusions  - Left ventricle: Systolic function  was normal. The estimated ejection fraction was in the range of 55% to 60%. Wall motion was normal; there were no regional wall motion abnormalities. - Aortic valve: Trileaflet; mildly thickened, mildly calcified leaflets. -  Mitral valve: There was mild to moderate regurgitation, with multiple jets. - Left atrium: The atrium was moderately dilated. No evidence of thrombus in the atrial cavity or appendage. There was moderatecontinuous spontaneous echo contrast (&quot;smoke&quot;) in the cavity and the appendage. The appendage was morphologically a left appendage, multilobulated, and of normal size. Emptying velocity was moderately reduced. - Right atrium: The atrium was dilated. No evidence of thrombus in the atrial cavity or appendage. - Atrial septum: No defect or patent foramen ovale was identified.  ------------------------------------------------------------------- Study data:   Study status:  Routine.  Consent:  The risks, benefits, and alternatives to the procedure were explained to the patient and informed consent was obtained.  Procedure:  Initial setup. The patient was brought to the laboratory. Surface ECG leads were monitored. Sedation. Conscious sedation was administered. Transesophageal echocardiography. Topical anesthesia was obtained using viscous lidocaine. An adult multiplane transesophageal probe was inserted by the attending cardiologist. Image quality was adequate.  Study completion:  The patient tolerated the procedure well. There were no complications.          Diagnostic transesophageal echocardiography.  2D and color Doppler. Birthdate:  Patient birthdate: 07-Feb-1934.  Age:  Patient is 89 yr old.  Sex:  Gender: female.    BMI: 30 kg/m^2.  Blood pressure: 159/90  Patient status:  Inpatient.  Study date:  Study date: 09/27/2017. Study time: 10:22 AM.  Location:   Endoscopy.  -------------------------------------------------------------------  ------------------------------------------------------------------- Left ventricle:  Systolic function was normal. The estimated ejection fraction was in the range of 55% to 60%. Wall motion was normal; there were no regional wall motion abnormalities.  ------------------------------------------------------------------- Aortic valve:   Trileaflet; mildly thickened, mildly calcified leaflets. Cusp separation was normal.  Doppler:  There was no significant regurgitation.  ------------------------------------------------------------------- Aorta:  There was no atheroma. There was no evidence for dissection. Aortic root: The aortic root was not dilated. Ascending aorta: The ascending aorta was normal in size. Aortic arch: The aortic arch was normal in size. Descending aorta: The descending aorta was normal in size.  ------------------------------------------------------------------- Mitral valve:   Structurally normal valve.   Leaflet separation was normal.  Doppler:  There was mild to moderate regurgitation, with multiple jets.  ------------------------------------------------------------------- Left atrium:  The atrium was moderately dilated.  No evidence of thrombus in the atrial cavity or appendage. There was moderatecontinuous spontaneous echo contrast (&quot;smoke&quot;) in the cavity and the appendage. The appendage was morphologically a left appendage, multilobulated, and of normal size. Emptying velocity was moderately reduced.  ------------------------------------------------------------------- Atrial septum:  No defect or patent foramen ovale was identified.  ------------------------------------------------------------------- Right ventricle:  The cavity size was normal. Wall thickness was normal. Systolic function was  normal.  ------------------------------------------------------------------- Pulmonic valve:    Structurally normal valve.  ------------------------------------------------------------------- Tricuspid valve:   Structurally normal valve.   Leaflet separation was normal.  Doppler:  There was mild regurgitation.  ------------------------------------------------------------------- Pulmonary artery:   The main pulmonary artery was normal-sized.  ------------------------------------------------------------------- Right atrium:  The atrium was dilated.  No evidence of thrombus in the atrial cavity or appendage. The appendage was morphologically a right appendage.  ------------------------------------------------------------------- Pericardium:  There was no pericardial effusion.  ------------------------------------------------------------------- Prepared and Electronically Authenticated by  Armanda Magic, MD 2019-01-21T19:54:34    CT SCANS  CT CORONARY MORPH W/CTA COR W/SCORE 11/22/2015  Addendum 11/22/2015  9:36 PM ADDENDUM REPORT: 11/22/2015 21:33 CLINICAL DATA:  Atrial fibrillation scheduled for an ablation. EXAM: Cardiac CT/CTA TECHNIQUE: The patient was scanned on a Siemens Somatom scanner. FINDINGS: A  120 kV prospective scan was triggered in the descending thoracic aorta at 111 HU's. Gantry rotation speed was 280 msecs and collimation was .9 mm. No beta blockade and no NTG was given. The 3D data set was reconstructed in 5% intervals of the 60-80 % of the R-R cycle. Diastolic phases were analyzed on a dedicated work station using MPR, MIP and VRT modes. The patient received 80 cc of contrast. There is normal pulmonary vein drainage into the left atrium (2 on the right and 2 on the left) with ostial measurements as follows: RUPV:  21 x 19 mm RLPV:  13 x 13 mm LUPV:  38 x 19 mm LLPV:  15 x 12 mm The left atrial appendage is large - chicken wing type with one major lobe  and ostial size 39 x 23 mm and length 41 mm. There is no thrombus in the left atrial appendage. The esophagus runs to the left, in the proximity to LLPV. Aorta: Normal caliber. No dissection. Mild diffuse calcifications. Aortic Valve:  Trileaflet.  Minimal calcifications. Coronary Arteries: Normal coronary origin. Right dominance. The study was performed without use of NTG and insufficient for plaque evaluation. IMPRESSION: 1. There is normal pulmonary vein drainage into the left atrium. 2. The left atrial appendage is large - chicken wing type with one major lobe and ostial size 39 x 23 mm and length 41 mm. There is no thrombus in the left atrial appendage. 3. The esophagus runs to the left, in the proximity to LLPV. 4. Calcium score of 777 what represents 91 percentile for age and sex. Normal coronary origin. Right dominance. The study was performed without use of NTG and insufficient for plaque evaluation. Tobias Alexander Electronically Signed By: Tobias Alexander On: 11/22/2015 21:33  Narrative EXAM: OVER-READ INTERPRETATION  CT CHEST  The following report is an over-read performed by radiologist Dr. Royal Piedra [facility] on 11/22/2015. This over-read does not include interpretation of cardiac or coronary anatomy or pathology. The coronary calcium score/coronary CTA interpretation by the cardiologist is attached.  COMPARISON:  None.  FINDINGS: 3 mm nodule in the superior segment of the right lower lobe (image 5 of series 204). Small area of scarring in the inferior segment of the lingula. Within the visualized portions of the thorax there are no areas of airspace consolidation, no pleural effusions, no pneumothorax and no lymphadenopathy. Small hiatal hernia. Visualized portions of the upper abdomen demonstrates 2 low-attenuation liver lesions, compatible with simple cysts, largest of which measures 2.6 cm between segments 4A and 4B of the liver.  Atherosclerotic calcifications in the upper abdominal vasculature. 11 mm low-attenuation (9 HU) nodule in the left adrenal gland is compatible with an adenoma. Multiple calcified granulomas in the spleen. There are no aggressive appearing lytic or blastic lesions noted in the visualized portions of the skeleton.  IMPRESSION: 1. 3 mm right lower lobe pulmonary nodule is statistically likely benign. No follow-up needed if patient is low-risk. Non-contrast chest CT can be considered in 12 months if patient is high-risk. This recommendation follows the consensus statement: Guidelines for Management of Incidental Pulmonary Nodules Detected on CT Images:From the Fleischner Society 2017; published online before print (10.1148/radiol.[id number]). 2. Small 11 mm left adrenal adenoma. 3. Small hiatal hernia. 4. Atherosclerosis throughout the visualized abdominal vasculature. 5. Additional incidental findings, as above.  Electronically Signed: By: Trudie Reed M.D. On: 11/22/2015 10:20           EKG: No new  Recent Labs: 08/22/2022: Magnesium  2.3 09/29/2022: Hemoglobin 14.3; Platelets 238 11/04/2022: ALT 17; TSH 3.189 12/14/2022: BUN 15; Creatinine, Ser 1.15; Potassium 4.1; Sodium 137  Recent Lipid Panel    Component Value Date/Time   CHOL 126 12/11/2020 0919   TRIG 110 12/11/2020 0919   HDL 61 12/11/2020 0919   CHOLHDL 2.1 12/11/2020 0919   LDLCALC 45 12/11/2020 0919     Risk Assessment/Calculations:        Physical Exam:    VS:  BP (!) 142/70   Pulse 68   Ht 5\' 4"  (1.626 m)   Wt 182 lb 12.8 oz (82.9 kg)   LMP  (LMP Unknown)   SpO2 97%   BMI 31.38 kg/m     Wt Readings from Last 3 Encounters:  02/09/23 182 lb 12.8 oz (82.9 kg)  11/04/22 184 lb 9.6 oz (83.7 kg)  10/08/22 185 lb 6.4 oz (84.1 kg)     GEN:  Well nourished, well developed in no acute distress HEENT: Normal NECK: No JVD; No carotid bruits LYMPHATICS: No lymphadenopathy CARDIAC: RRR, no murmurs,  rubs, gallops RESPIRATORY:  Clear to auscultation without rales, wheezing or rhonchi  ABDOMEN: Soft, non-tender, non-distended MUSCULOSKELETAL:  No edema; No deformity  SKIN: Warm and dry NEUROLOGIC:  Alert and oriented x 3 PSYCHIATRIC:  Normal affect   ASSESSMENT:    1. Persistent atrial fibrillation (HCC)   2. Secondary hypercoagulable state (HCC)   3. Primary hypertension    PLAN:    In order of problems listed above:  Persistent fibrillation/flutter - Continue with amiodarone 100 mg a day.  She thinks that this may have exacerbated her tremor.  Continue with this low-dose.  She rarely will since heart rates in the 120s usually lasting a few minutes duration.  Could be brief periods of atrial flutter.  She has an Scientist, physiological.  Chronic anticoagulation - Continue with Eliquis 5 mg twice a day  Hypertension - Currently on diltiazem long-acting 300 mg mg daily, recently started amlodipine-causing some mild ankle edema.  Currently on ramipril.  She is working with Dr. Margaretann Loveless on her blood pressure.  Appreciate this.          Medication Adjustments/Labs and Tests Ordered: Current medicines are reviewed at length with the patient today.  Concerns regarding medicines are outlined above.  No orders of the defined types were placed in this encounter.  No orders of the defined types were placed in this encounter.   Patient Instructions  Medication Instructions:  The current medical regimen is effective;  continue present plan and medications.  *If you need a refill on your cardiac medications before your next appointment, please call your pharmacy*  Follow-Up: At [facility], you and your health needs are our priority.  As part of our continuing mission to provide you with exceptional heart care, we have created designated Provider Care Teams.  These Care Teams include your primary Cardiologist (physician) and Advanced Practice Providers (APPs -  Physician Assistants and  Nurse Practitioners) who all work together to provide you with the care you need, when you need it.  We recommend signing up for the patient portal called "MyChart".  Sign up information is provided on this After Visit Summary.  MyChart is used to connect with patients for Virtual Visits (Telemedicine).  Patients are able to view lab/test results, encounter notes, upcoming appointments, etc.  Non-urgent messages can be sent to your provider as well.   To learn more about what you can do with MyChart, go to  [email or web address].    Your next appointment:   6 month(s)  Provider:   You will follow up in the Atrial Fibrillation Clinic located at [facility]. Your provider will be: Clint R. Fenton, PA-C  And 1 year with Dr Anne Fu        Signed, Donato Schultz, MD  02/09/2023 10:41 AM    [facility]

## 2023-02-09 NOTE — Patient Instructions (Signed)
Medication Instructions:  The current medical regimen is effective;  continue present plan and medications.  *If you need a refill on your cardiac medications before your next appointment, please call your pharmacy*  Follow-Up: At [facility], you and your health needs are our priority.  As part of our continuing mission to provide you with exceptional heart care, we have created designated Provider Care Teams.  These Care Teams include your primary Cardiologist (physician) and Advanced Practice Providers (APPs -  Physician Assistants and Nurse Practitioners) who all work together to provide you with the care you need, when you need it.  We recommend signing up for the patient portal called "MyChart".  Sign up information is provided on this After Visit Summary.  MyChart is used to connect with patients for Virtual Visits (Telemedicine).  Patients are able to view lab/test results, encounter notes, upcoming appointments, etc.  Non-urgent messages can be sent to your provider as well.   To learn more about what you can do with MyChart, go to [email or web address].    Your next appointment:   6 month(s)  Provider:   You will follow up in the Atrial Fibrillation Clinic located at [facility]. Your provider will be: Clint R. Fenton, PA-C  And 1 year with Dr Anne Fu

## 2023-03-08 DIAGNOSIS — I129 Hypertensive chronic kidney disease with stage 1 through stage 4 chronic kidney disease, or unspecified chronic kidney disease: Secondary | ICD-10-CM | POA: Diagnosis not present

## 2023-04-29 ENCOUNTER — Other Ambulatory Visit (HOSPITAL_COMMUNITY): Payer: Self-pay | Admitting: Cardiology

## 2023-04-29 DIAGNOSIS — I4819 Other persistent atrial fibrillation: Secondary | ICD-10-CM

## 2023-04-29 NOTE — Telephone Encounter (Signed)
Pt last saw Dr Anne Fu 02/09/23, last labs 12/14/22 Creat 1.15, age 89, weight 82.9kg, based on specified criteria pt is on appropriate dosage of Eliquis 5mg  BID.  Will refill rx.

## 2023-05-31 DIAGNOSIS — K629 Disease of anus and rectum, unspecified: Secondary | ICD-10-CM | POA: Diagnosis not present

## 2023-06-14 ENCOUNTER — Other Ambulatory Visit: Payer: Self-pay | Admitting: Internal Medicine

## 2023-06-14 DIAGNOSIS — K6289 Other specified diseases of anus and rectum: Secondary | ICD-10-CM | POA: Diagnosis not present

## 2023-06-25 DIAGNOSIS — R3911 Hesitancy of micturition: Secondary | ICD-10-CM | POA: Diagnosis not present

## 2023-06-25 DIAGNOSIS — F320 Major depressive disorder, single episode, mild: Secondary | ICD-10-CM | POA: Diagnosis not present

## 2023-07-06 DIAGNOSIS — K6289 Other specified diseases of anus and rectum: Secondary | ICD-10-CM | POA: Diagnosis not present

## 2023-07-22 ENCOUNTER — Ambulatory Visit
Admission: RE | Admit: 2023-07-22 | Discharge: 2023-07-22 | Disposition: A | Discharge status: Home / Self Care | Payer: Medicare Other | Source: Ambulatory Visit | Attending: Internal Medicine | Admitting: Internal Medicine

## 2023-07-22 DIAGNOSIS — K6289 Other specified diseases of anus and rectum: Secondary | ICD-10-CM | POA: Diagnosis not present

## 2023-07-22 DIAGNOSIS — K4020 Bilateral inguinal hernia, without obstruction or gangrene, not specified as recurrent: Secondary | ICD-10-CM | POA: Diagnosis not present

## 2023-07-22 DIAGNOSIS — K5730 Diverticulosis of large intestine without perforation or abscess without bleeding: Secondary | ICD-10-CM | POA: Diagnosis not present

## 2023-07-22 DIAGNOSIS — R102 Pelvic and perineal pain: Secondary | ICD-10-CM | POA: Diagnosis not present

## 2023-07-22 MED ORDER — GADOPICLENOL 0.5 MMOL/ML IV SOLN
8.0000 mL | Freq: Once | INTRAVENOUS | Status: AC | PRN
  Administered 2023-07-22: 8 mL via INTRAVENOUS

## 2023-08-02 ENCOUNTER — Ambulatory Visit (HOSPITAL_COMMUNITY): Payer: Medicare Other | Admitting: Physician Assistant

## 2023-08-11 DIAGNOSIS — I129 Hypertensive chronic kidney disease with stage 1 through stage 4 chronic kidney disease, or unspecified chronic kidney disease: Secondary | ICD-10-CM | POA: Diagnosis not present

## 2023-08-11 DIAGNOSIS — E039 Hypothyroidism, unspecified: Secondary | ICD-10-CM | POA: Diagnosis not present

## 2023-08-11 DIAGNOSIS — Z1331 Encounter for screening for depression: Secondary | ICD-10-CM | POA: Diagnosis not present

## 2023-08-11 DIAGNOSIS — N1831 Chronic kidney disease, stage 3a: Secondary | ICD-10-CM | POA: Diagnosis not present

## 2023-08-11 DIAGNOSIS — E7800 Pure hypercholesterolemia, unspecified: Secondary | ICD-10-CM | POA: Diagnosis not present

## 2023-08-11 DIAGNOSIS — I480 Paroxysmal atrial fibrillation: Secondary | ICD-10-CM | POA: Diagnosis not present

## 2023-08-11 DIAGNOSIS — Z0000 Encounter for general adult medical examination without abnormal findings: Secondary | ICD-10-CM | POA: Diagnosis not present

## 2023-08-11 DIAGNOSIS — I700 Atherosclerosis of aorta: Secondary | ICD-10-CM | POA: Diagnosis not present

## 2023-08-11 DIAGNOSIS — Z23 Encounter for immunization: Secondary | ICD-10-CM | POA: Diagnosis not present

## 2023-08-11 DIAGNOSIS — D6869 Other thrombophilia: Secondary | ICD-10-CM | POA: Diagnosis not present

## 2023-08-11 DIAGNOSIS — Z79899 Other long term (current) drug therapy: Secondary | ICD-10-CM | POA: Diagnosis not present

## 2023-08-16 DIAGNOSIS — K6289 Other specified diseases of anus and rectum: Secondary | ICD-10-CM | POA: Diagnosis not present

## 2023-08-16 DIAGNOSIS — R194 Change in bowel habit: Secondary | ICD-10-CM | POA: Diagnosis not present

## 2023-08-25 ENCOUNTER — Encounter (HOSPITAL_COMMUNITY): Payer: Self-pay | Admitting: Physician Assistant

## 2023-08-25 ENCOUNTER — Ambulatory Visit (HOSPITAL_COMMUNITY)
Admission: RE | Admit: 2023-08-25 | Discharge: 2023-08-25 | Disposition: A | Discharge status: Home / Self Care | Payer: Medicare Other | Source: Ambulatory Visit | Attending: Physician Assistant | Admitting: Physician Assistant

## 2023-08-25 VITALS — BP 132/74 | HR 64 | Ht 64.0 in | Wt 181.4 lb

## 2023-08-25 DIAGNOSIS — D6869 Other thrombophilia: Secondary | ICD-10-CM | POA: Diagnosis not present

## 2023-08-25 DIAGNOSIS — I4891 Unspecified atrial fibrillation: Secondary | ICD-10-CM | POA: Diagnosis not present

## 2023-08-25 DIAGNOSIS — E039 Hypothyroidism, unspecified: Secondary | ICD-10-CM | POA: Insufficient documentation

## 2023-08-25 DIAGNOSIS — Z5181 Encounter for therapeutic drug level monitoring: Secondary | ICD-10-CM | POA: Diagnosis not present

## 2023-08-25 DIAGNOSIS — Z79899 Other long term (current) drug therapy: Secondary | ICD-10-CM | POA: Insufficient documentation

## 2023-08-25 DIAGNOSIS — I129 Hypertensive chronic kidney disease with stage 1 through stage 4 chronic kidney disease, or unspecified chronic kidney disease: Secondary | ICD-10-CM | POA: Insufficient documentation

## 2023-08-25 DIAGNOSIS — I4892 Unspecified atrial flutter: Secondary | ICD-10-CM | POA: Diagnosis not present

## 2023-08-25 DIAGNOSIS — I4819 Other persistent atrial fibrillation: Secondary | ICD-10-CM | POA: Insufficient documentation

## 2023-08-25 DIAGNOSIS — Z7901 Long term (current) use of anticoagulants: Secondary | ICD-10-CM | POA: Insufficient documentation

## 2023-08-25 LAB — COMPREHENSIVE METABOLIC PANEL
ALT: 15 U/L (ref 0–44)
AST: 24 U/L (ref 15–41)
Albumin: 4 g/dL (ref 3.5–5.0)
Alkaline Phosphatase: 74 U/L (ref 38–126)
Anion gap: 11 (ref 5–15)
BUN: 19 mg/dL (ref 8–23)
CO2: 21 mmol/L — ABNORMAL LOW (ref 22–32)
Calcium: 9.8 mg/dL (ref 8.9–10.3)
Chloride: 108 mmol/L (ref 98–111)
Creatinine, Ser: 1.46 mg/dL — ABNORMAL HIGH (ref 0.44–1.00)
GFR, Estimated: 35 mL/min — ABNORMAL LOW (ref >60)
Glucose, Bld: 92 mg/dL (ref 70–99)
Potassium: 4.1 mmol/L (ref 3.5–5.1)
Sodium: 140 mmol/L (ref 135–145)
Total Bilirubin: 0.5 mg/dL (ref <1.2)
Total Protein: 7.1 g/dL (ref 6.5–8.1)

## 2023-08-25 LAB — TSH: TSH: 4.291 u[IU]/mL (ref 0.350–4.500)

## 2023-08-25 MED ORDER — DILTIAZEM HCL ER COATED BEADS 180 MG PO CP24: 180.0000 mg | ORAL_CAPSULE | Freq: Every day | ORAL | 1 refills | Status: DC

## 2023-08-25 NOTE — Progress Notes (Signed)
Primary Care Physician: Thana Ates, MD Primary Cardiologist: Donato Schultz, MD Electrophysiologist: Maurice Small, MD  Referring Physician: Dr Johney Frame   Frances [name] is a 89 y.o. female with a history of CKD, HTN, hypothyroidism, atrial flutter, atrial fibrillation who presents for follow up in the [facility].  The patient was initially diagnosed with atrial fibrillation remotely and is s/p ablation in 2017 and 2019. She was loaded on dofetilide but this was discontinued due to QT prolongation. She was loaded on amiodarone. Patient is on Eliquis for a CHADS2VASC score of 4.  On follow up today, patient reports that she has not had any symptoms of afib. She does feel chronically fatigued and wonders if it could be some of her medication. No bleeding issues on anticoagulation.   Today, she denies symptoms of palpitations, chest pain, shortness of breath, orthopnea, PND, lower extremity edema, dizziness, presyncope, syncope, snoring, daytime somnolence, bleeding, or neurologic sequela. The patient is tolerating medications without difficulties and is otherwise without complaint today.    Atrial Fibrillation Risk Factors:  she does not have symptoms or diagnosis of sleep apnea. she does not have a history of rheumatic fever. she does not have a history of alcohol use.   Atrial Fibrillation Management history:  Previous antiarrhythmic drugs: dofetilide (QT prolongation), amiodarone  Previous cardioversions: 2018, 2019, 2021, 2022, 07/02/22, 10/01/22 Previous ablations: 11/28/15, 09/28/17 Anticoagulation history: Eliquis  ROS- All systems are reviewed and negative except as per the HPI above.  Past Medical History:  Diagnosis Date   Arthritis    hands, back   Asthmatic bronchitis    Atrial fibrillation (HCC) 07/31/2013   Chronic anticoagulation 07/30/2014   Chronic kidney disease    stage 3 renal disease - no med   Dizziness 07/30/2014    Dysrhythmia    Hx - a-fib 05/2010 - tx with meds, no problem since 05/2010   First degree AV block 07/30/2014   Hearing loss    bilateral hearing aids   Hypertension    controlled with meds   Hypothyroidism    Internal hemorrhoid    PAF (paroxysmal atrial fibrillation) (HCC) 11/28/2015   Paroxysmal atrial fibrillation (HCC)    Peripheral vascular disease (HCC)    right arm and right shoulder blood clots r/t a fall   Persistent atrial fibrillation (HCC) 09/28/2017   Rectal bleeding    SVD (spontaneous vaginal delivery) 1957, 1959   x 2   Thyroid disease    hypothyroidism    Current Outpatient Medications  Medication Sig Dispense Refill   acetaminophen (TYLENOL) 500 MG tablet Take 500-1,000 mg by mouth as needed for moderate pain or headache.     amiodarone (PACERONE) 200 MG tablet Take 0.5 tablets (100 mg total) by mouth daily. 90 tablet 1   amLODipine (NORVASC) 5 MG tablet Take 5 mg by mouth daily.     Biotin (BIOTIN 5000) 5 MG CAPS Take 5 mg by mouth daily with lunch.     Calcium Carb-Cholecalciferol (CALCIUM 600 + D PO) Take 1 tablet by mouth daily with lunch. 600 mg / 20 mcg     diclofenac Sodium (VOLTAREN) 1 % GEL Apply 1 Application topically as needed (pain).     diltiazem (CARDIZEM CD) 180 MG 24 hr capsule Take 1 capsule (180 mg total) by mouth daily. 90 capsule 1   diltiazem (CARDIZEM) 30 MG tablet TAKE 1 TABLET BY MOUTH EVERY 4 HOURS AS NEEDED (FOR FAST HEART RATE > 100  AS LONG AS BP > 100). 135 tablet 1   ELIQUIS 5 MG TABS tablet TAKE 1 TABLET BY MOUTH TWICE A DAY 180 tablet 1   famotidine-calcium carbonate-magnesium hydroxide (PEPCID COMPLETE) 10-800-165 MG chewable tablet Chew 1 tablet by mouth daily as needed (acid reflux).     fluticasone (FLONASE) 50 MCG/ACT nasal spray Place 1 spray into both nostrils as needed.     levothyroxine (SYNTHROID, LEVOTHROID) 75 MCG tablet Take 75 mcg by mouth daily before breakfast.     PARoxetine (PAXIL) 30 MG tablet Take 30 mg by mouth  daily.     Probiotic Product (PROBIOTIC PO) Take 1 capsule by mouth daily with lunch.     ramipril (ALTACE) 10 MG capsule Take 1 capsule (10 mg total) by mouth 2 (two) times daily. 60 capsule    rosuvastatin (CRESTOR) 10 MG tablet TAKE 1 TABLET BY MOUTH EVERY DAY 90 tablet 3   vitamin B-12 (CYANOCOBALAMIN) 1000 MCG tablet Take 1,000 mcg by mouth daily with lunch.     Wheat Dextrin (BENEFIBER) POWD Take 1 Scoop by mouth daily.     No current facility-administered medications for this encounter.    Physical Exam: BP 132/74   Pulse 64   Ht 5\' 4"  (1.626 m)   Wt 82.3 kg   LMP  (LMP Unknown)   BMI 31.14 kg/m   GEN: Well nourished, well developed in no acute distress NECK: No JVD; No carotid bruits CARDIAC: Regular rate and rhythm, no murmurs, rubs, gallops RESPIRATORY:  Clear to auscultation without rales, wheezing or rhonchi  ABDOMEN: Soft, non-tender, non-distended EXTREMITIES:  No edema; No deformity   Wt Readings from Last 3 Encounters:  08/25/23 82.3 kg  02/09/23 82.9 kg  11/04/22 83.7 kg     EKG today demonstrates  SR, 1st degree AV block, LAFB Vent. rate 64 BPM PR interval 254 ms QRS duration 90 ms QT/QTcB 448/462 ms  Echo 10/30/22 demonstrated   1. Left ventricular ejection fraction, by estimation, is 65 to 70%. The  left ventricle has normal function. The left ventricle has no regional  wall motion abnormalities. There is mild left ventricular hypertrophy.  Left ventricular diastolic parameters are consistent with Grade II diastolic dysfunction (pseudonormalization). The average left ventricular global longitudinal strain is -16.1 %. The global longitudinal strain is abnormal.   2. Right ventricular systolic function is normal. The right ventricular  size is normal. There is normal pulmonary artery systolic pressure.   3. Left atrial size was mildly dilated.   4. The mitral valve is normal in structure. Trivial mitral valve  regurgitation. No evidence of mitral  stenosis.   5. The aortic valve is tricuspid. There is mild calcification of the  aortic valve. There is mild thickening of the aortic valve. Aortic valve  regurgitation is not visualized. No aortic stenosis is present.   6. The inferior vena cava is normal in size with greater than 50%  respiratory variability, suggesting right atrial pressure of 3 mmHg.    CHA2DS2-VASc Score = 4  The patient's score is based upon: CHF History: 0 HTN History: 1 Diabetes History: 0 Stroke History: 0 Vascular Disease History: 0 Age Score: 2 Gender Score: 1       ASSESSMENT AND PLAN: Persistent Atrial Fibrillation (ICD10:  I48.19) The patient's CHA2DS2-VASc score is 4, indicating a 4.8% annual risk of stroke.   S/p afib ablation 2017 and 2019 Patient appears to be maintaining SR Continue amiodarone 100 mg daily  Check cmet/TSH today Will  decrease diltiazem to 180 mg daily to see if this helps her fatigue. Continue diltiazem 30 mg PRN q 4 hours for heart racing  Secondary Hypercoagulable State (ICD10:  D68.69) The patient is at significant risk for stroke/thromboembolism based upon her CHA2DS2-VASc Score of 4.  Continue Apixaban (Eliquis).   HTN Stable today Decrease diltiazem as above    Follow up with Dr Anne Fu per recall. AF clinic in one year.        Jorja Loa PA-C Afib Clinic [facility] [address] [place], [place] [address] [phone]

## 2023-09-30 ENCOUNTER — Other Ambulatory Visit: Payer: Self-pay | Admitting: Cardiology

## 2023-10-25 DIAGNOSIS — R399 Unspecified symptoms and signs involving the genitourinary system: Secondary | ICD-10-CM | POA: Diagnosis not present

## 2023-11-04 ENCOUNTER — Other Ambulatory Visit (HOSPITAL_COMMUNITY): Payer: Self-pay | Admitting: Cardiology

## 2023-11-04 DIAGNOSIS — I4819 Other persistent atrial fibrillation: Secondary | ICD-10-CM

## 2023-11-04 NOTE — Telephone Encounter (Signed)
 Pt last saw Clint Fenton, PA on 08/25/23, last labs 08/25/23 Creat 1.46, age 89, weight 82.3kg, based on specified criteria pt is on appropriate dosage of Eliquis 5mg  BID for afib.  Will refill rx.

## 2023-11-15 DIAGNOSIS — R399 Unspecified symptoms and signs involving the genitourinary system: Secondary | ICD-10-CM | POA: Diagnosis not present

## 2023-11-15 DIAGNOSIS — R102 Pelvic and perineal pain: Secondary | ICD-10-CM | POA: Diagnosis not present

## 2023-11-25 ENCOUNTER — Encounter: Payer: Self-pay | Admitting: Obstetrics and Gynecology

## 2023-11-25 ENCOUNTER — Ambulatory Visit (INDEPENDENT_AMBULATORY_CARE_PROVIDER_SITE_OTHER): Admitting: Obstetrics and Gynecology

## 2023-11-25 VITALS — BP 118/64 | HR 78 | Ht 63.3 in | Wt 176.6 lb

## 2023-11-25 DIAGNOSIS — N8110 Cystocele, unspecified: Secondary | ICD-10-CM | POA: Diagnosis not present

## 2023-11-25 DIAGNOSIS — Z87440 Personal history of urinary (tract) infections: Secondary | ICD-10-CM

## 2023-11-25 DIAGNOSIS — N952 Postmenopausal atrophic vaginitis: Secondary | ICD-10-CM | POA: Diagnosis not present

## 2023-11-25 DIAGNOSIS — R151 Fecal smearing: Secondary | ICD-10-CM | POA: Diagnosis not present

## 2023-11-25 DIAGNOSIS — R350 Frequency of micturition: Secondary | ICD-10-CM | POA: Diagnosis not present

## 2023-11-25 DIAGNOSIS — M6289 Other specified disorders of muscle: Secondary | ICD-10-CM

## 2023-11-25 DIAGNOSIS — R399 Unspecified symptoms and signs involving the genitourinary system: Secondary | ICD-10-CM

## 2023-11-25 LAB — POCT URINALYSIS DIPSTICK
Bilirubin, UA: NEGATIVE
Blood, UA: NEGATIVE
Glucose, UA: NEGATIVE
Ketones, UA: NEGATIVE
Leukocytes, UA: NEGATIVE
Nitrite, UA: NEGATIVE
Protein, UA: NEGATIVE
Spec Grav, UA: 1.01 (ref 1.010–1.025)
Urobilinogen, UA: 0.2 U/dL
pH, UA: 7 (ref 5.0–8.0)

## 2023-11-25 MED ORDER — ESTRADIOL 0.1 MG/GM VA CREA: 0.5000 g | TOPICAL_CREAM | VAGINAL | 11 refills | Status: AC

## 2023-11-25 NOTE — Progress Notes (Signed)
 [facility] New Patient Evaluation and Consultation  Referring Provider: Thana Ates, MD PCP: Thana Ates, MD Date of Service: 11/25/2023  SUBJECTIVE Chief Complaint: New Patient (Initial Visit) (Frances [name] is a 89 y.o. female is here for pelvic floor pain and frequent UTI's.)  History of Present Illness: Frances [name] is a 89 y.o. White or Caucasian female seen in consultation at the request of Dr. Margaretann Loveless for evaluation of rUTI.  She reports pain with urination and gets a shooting pain sensation. Endorses vaginal pain.   Stopped using Oral premarin in 2013.   Review of records significant for: Cultures From [place]: 11/15/23: Mixed Flora 10-25k colonies No other faxed culture results, have reached out to [facility] practice to try and obtain further culture results  Negative Aptima Swab on 11/15/23  No hx of sleep apnea  Urinary Symptoms: Does not leak urine.    Day time voids 6-9.  Nocturia: 3/4 times per night to void. Voiding dysfunction:  does not empty bladder well.  Patient does not use a catheter to empty bladder.  When urinating, patient feels a weak stream and the need to urinate multiple times in a row Drinks: Hot Chocolate/Milk/OJ in AM, 32-40oz Water, 8oz Coke Zero, Tumbler of Green Tea at supper per day  UTIs: Multiple** UTI's in the last year.   Denies history of blood in urine, kidney or bladder stones, pyelonephritis, bladder cancer, and kidney cancer No results found for the last 90 days.   Pelvic Organ Prolapse Symptoms:                  Patient Denies a feeling of a bulge the vaginal area.    Bowel Symptom: Bowel movements: 2-3 time(s) per day Stool consistency: soft  Straining: no.  Splinting: no.  Incomplete evacuation: no.  Patient Admits to accidental bowel leakage / fecal incontinence  Occurs: with gas  Consistency with leakage: soft  Bowel regimen: miralax Last colonoscopy: Date , Results WNL HM Colonoscopy   This  patient has no relevant Health Maintenance data.     Sexual Function Sexually active: no.  Sexual orientation: Straight Pain with sex: No  Pelvic Pain Denies pelvic pain    Past Medical History:  Past Medical History:  Diagnosis Date   Arthritis    hands, back   Asthmatic bronchitis    Atrial fibrillation (HCC) 07/31/2013   Chronic anticoagulation 07/30/2014   Chronic kidney disease    stage 3 renal disease - no med   Dizziness 07/30/2014   Dysrhythmia    Hx - a-fib 05/2010 - tx with meds, no problem since 05/2010   First degree AV block 07/30/2014   Hearing loss    bilateral hearing aids   Hypertension    controlled with meds   Hypothyroidism    Internal hemorrhoid    PAF (paroxysmal atrial fibrillation) (HCC) 11/28/2015   Paroxysmal atrial fibrillation (HCC)    Peripheral vascular disease (HCC)    right arm and right shoulder blood clots r/t a fall   Persistent atrial fibrillation (HCC) 09/28/2017   Rectal bleeding    SVD (spontaneous vaginal delivery) 1957, 1959   x 2   Thyroid disease    hypothyroidism     Past Surgical History:   Past Surgical History:  Procedure Laterality Date   ABDOMINAL HYSTERECTOMY     and right ovary   ANTERIOR AND POSTERIOR REPAIR N/A 01/03/2014   Procedure: ANTERIOR (CYSTOCELE) REPAIR;  Surgeon: Tresa Endo A. Ernestina Penna, MD;  Location: [facility];  Service: Gynecology;  Laterality: N/A;   APPENDECTOMY     ATRIAL FIBRILLATION ABLATION N/A 09/28/2017   Procedure: ATRIAL FIBRILLATION ABLATION;  Surgeon: Hillis Range, MD;  Location: [facility];  Service: Cardiovascular;  Laterality: N/A;   BACK SURGERY     x 3 - 2 rods and 8 screws   CARDIOVERSION N/A 08/24/2017   Procedure: CARDIOVERSION;  Surgeon: Jake Bathe, MD;  Location: [facility] ENDOSCOPY;  Service: Cardiovascular;  Laterality: N/A;   CARDIOVERSION N/A 10/19/2017   Procedure: CARDIOVERSION;  Surgeon: Wendall Stade, MD;  Location: [name] ENDOSCOPY;  Service: Cardiovascular;   Laterality: N/A;   CARDIOVERSION N/A 10/04/2019   Procedure: CARDIOVERSION;  Surgeon: Chrystie Nose, MD;  Location: [facility] ENDOSCOPY;  Service: Cardiovascular;  Laterality: N/A;   CARDIOVERSION N/A 08/20/2021   Procedure: CARDIOVERSION;  Surgeon: Jake Bathe, MD;  Location: [facility] ENDOSCOPY;  Service: Cardiovascular;  Laterality: N/A;   CARDIOVERSION N/A 07/02/2022   Procedure: CARDIOVERSION;  Surgeon: Sande Rives, MD;  Location: [facility] ENDOSCOPY;  Service: Cardiovascular;  Laterality: N/A;   CARDIOVERSION N/A 10/01/2022   Procedure: CARDIOVERSION;  Surgeon: Jake Bathe, MD;  Location: [facility] ENDOSCOPY;  Service: Cardiovascular;  Laterality: N/A;   COLONOSCOPY     CYSTOCELE REPAIR     DENTAL SURGERY     infected tooth - general   DILATION AND CURETTAGE OF UTERUS     x several   ELECTROPHYSIOLOGIC STUDY N/A 11/28/2015   Procedure: Atrial Fibrillation Ablation;  Surgeon: Hillis Range, MD;  Location: [facility] INVASIVE CV LAB;  Service: Cardiovascular;  Laterality: N/A;   EP IMPLANTABLE DEVICE N/A 06/08/2016   Procedure: Loop Recorder Insertion;  Surgeon: Hillis Range, MD;  Location: [facility];  Service: Cardiovascular;  Laterality: N/A;   EXAMINATION UNDER ANESTHESIA N/A 01/15/2014   Procedure: EXAM UNDER ANESTHESIA with evacuation of hematoma and over sewing of vaginal mucosa.;  Surgeon: Alphonsus Sias. Ernestina Penna, MD;  Location: [facility];  Service: Gynecology;  Laterality: N/A;   HAND SURGERY     left   hemorrhoid injection  04/2011   implantable loop recorder removal  08/06/2021   MDT Reveal LINQ removed by Dr Johney Frame   JOINT REPLACEMENT     left thumb replacement   MYRINGOTOMY WITH TUBE PLACEMENT Right 10/27/2018   TEE WITHOUT CARDIOVERSION N/A 09/27/2017   Procedure: TRANSESOPHAGEAL ECHOCARDIOGRAM (TEE);  Surgeon: Quintella Reichert, MD;  Location: [facility] ENDOSCOPY;  Service: Cardiovascular;  Laterality: N/A;   TONSILLECTOMY       Past OB/GYN History: [id number] Vaginal deliveries: 2,  Forceps/  Vacuum deliveries: 0, Cesarean section: 0 Menopausal: Yes Contraception: NA. Last pap smear was unknown.  Any history of abnormal pap smears: no. HM PAP   This patient has no relevant Health Maintenance data.     Medications: Patient has a current medication list which includes the following prescription(s): acetaminophen, amiodarone, biotin, calcium carb-cholecalciferol, diclofenac sodium, diltiazem, diltiazem, eliquis, estradiol, famotidine-calcium carbonate-magnesium hydroxide, fluticasone, levothyroxine, ramipril, rosuvastatin, cyanocobalamin, benefiber, and probiotic product.   Allergies: Patient is allergic to pneumovax 23 [pneumococcal vac polyvalent], codeine, cephalexin, ciprofloxacin, sudafed [pseudoephedrine hcl], and sulfa antibiotics.   Social History:  Social History   Tobacco Use   Smoking status: Never    Passive exposure: Past   Smokeless tobacco: Never   Tobacco comments:    Never smoke 07/10/22  Vaping Use   Vaping status: Never Used  Substance Use Topics   Alcohol use: No   Drug use: No  Relationship status: widowed Patient lives with son.   Patient is not employed. Regular exercise: No History of abuse: No  Family History:   Family History  Problem Relation Age of Onset   Heart disease Mother    Heart attack Mother    Hypertension Father    Diabetes Father    Stroke Father    Heart disease Sister    Hypertension Sister    Heart attack Sister    Heart disease Brother    Heart failure Brother    Stroke Son    Parkinson's disease Son    Sudden death Maternal Grandfather      Review of Systems: Review of Systems  Constitutional:  Positive for malaise/fatigue. Negative for chills and fever.  Respiratory:  Negative for cough and shortness of breath.   Cardiovascular:  Positive for palpitations (+A-fib). Negative for chest pain.  Gastrointestinal:  Negative for abdominal pain, blood in stool, constipation and diarrhea.  Genitourinary:   Positive for dysuria.  Skin:  Negative for rash.  Neurological:  Positive for weakness.  Endo/Heme/Allergies:  Bruises/bleeds easily.       +Hot Flashes  Psychiatric/Behavioral:  Negative for depression and suicidal ideas.      OBJECTIVE Physical Exam: Vitals:   11/25/23 0955  BP: 118/64  Pulse: 78  Weight: 176 lb 9.6 oz (80.1 kg)  Height: 5' 3.3" (1.608 m)    Physical Exam Vitals reviewed. Exam conducted with a chaperone present.  Constitutional:      Appearance: Normal appearance.  Pulmonary:     Effort: Pulmonary effort is normal.  Abdominal:     Palpations: Abdomen is soft.  Neurological:     General: No focal deficit present.     Mental Status: She is alert and oriented to person, place, and time.  Psychiatric:        Mood and Affect: Mood normal.        Behavior: Behavior normal. Behavior is cooperative.        Thought Content: Thought content normal.      GU / Detailed Urogynecologic Evaluation:  Pelvic Exam: Normal external female genitalia; Bartholin's and Skene's glands normal in appearance; urethral meatus normal in appearance, no urethral masses or discharge.   CST: negative   s/p hysterectomy: Speculum exam reveals normal vaginal mucosa with  atrophy and normal vaginal cuff.  Adnexa normal adnexa.    With apex supported, anterior compartment defect was present  Pelvic floor strength I/V  Pelvic floor musculature: Right levator tender, Right obturator non-tender, Left levator tender, Left obturator non-tender  POP-Q:   POP-Q  -1.5                                            Aa   -1.5                                           Ba  -2.5                                              C   3  Gh  5                                            Pb  6.5                                            tvl   -3                                            Ap  -3                                            Bp                                                  D      Rectal Exam:  Normal sphincter tone, no distal rectocele, enterocoele not present, no rectal masses, noted dyssynergia when asking the patient to bear down.  Post-Void Residual (PVR) by Bladder Scan: In order to evaluate bladder emptying, we discussed obtaining a postvoid residual and patient agreed to this procedure.  Procedure: The ultrasound unit was placed on the patient's abdomen in the suprapubic region after the patient had voided.    Post Void Residual - 11/25/23 1039       Post Void Residual   Post Void Residual 19 mL              Laboratory Results: Lab Results  Component Value Date   COLORU yellow 11/25/2023   CLARITYU clear 11/25/2023   GLUCOSEUR Negative 11/25/2023   BILIRUBINUR negative 11/25/2023   KETONESU negative 11/25/2023   SPECGRAV 1.010 11/25/2023   RBCUR negative 11/25/2023   PHUR 7.0 11/25/2023   PROTEINUR Negative 11/25/2023   UROBILINOGEN 0.2 11/25/2023   LEUKOCYTESUR Negative 11/25/2023    Lab Results  Component Value Date   CREATININE 1.46 (H) 08/25/2023   CREATININE 1.15 (H) 12/14/2022   CREATININE 1.04 (H) 11/13/2022    No results found for: "HGBA1C"  Lab Results  Component Value Date   HGB 14.3 09/29/2022     ASSESSMENT AND PLAN Ms. Darnell is a 89 y.o. with:  1. Vaginal atrophy   2. Prolapse of anterior vaginal wall   3. Pelvic floor dysfunction   4. Fecal smearing   5. Lower urinary tract symptoms (LUTS)   6. Urinary frequency    Patient has vaginal atrophy on exam. She would benefit from estrogen cream. Patient to use a blueberry sized amount into the vagina. She may use this nightly for 2 weeks and then twice weekly after. We discussed using her finger instead of using the applicator.  Patient has stage I-II/IV anterior vaginal, I/IV apical, and 0/IV posterior prolapse. Patient has had x2 reported cystocele repairs. 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  Patient reports he will soon have a lift chair which will be more helpful. We discussed it may be a good idea to do pelvic floor PT as she cannot do a proper kegel and has some rectal muscle dysfunction.  Would suggest she start pelvic floor PT. Referral placed.  Patient also has a loss of stool when she passes gas. Pelvic floor PT may be helpful with this.  Patient has hx of LUTS symptoms. She reports they were UTI's but I do not have positive culture results. I am awaiting culture results from [facility]. These will be scanned into chart once received. Urine clear today.    Selmer Dominion, NP

## 2023-11-25 NOTE — Patient Instructions (Addendum)
 Today we talked about ways to manage bladder urgency such as altering your diet to avoid irritative beverages and foods (bladder diet) as well as attempting to decrease stress and other exacerbating factors.     The Most Bothersome Foods* The Least Bothersome Foods*  Coffee - Regular & Decaf Tea - caffeinated Carbonated beverages - cola, non-colas, diet & caffeine-free Alcohols - Beer, Red Wine, White Wine, [address] - Grapefruit, [place], Orange, [employer] - Cranberry, Grapefruit, Orange, Pineapple Vegetables - Tomato & Tomato Products Flavor Enhancers - Hot peppers, Spicy foods, Chili, Horseradish, Vinegar, Monosodium glutamate (MSG) Artificial Sweeteners - NutraSweet, Sweet 'N Low, Equal (sweetener), Saccharin Ethnic foods - [place], [place], [place] food [employer] - low-fat & whole Fruits - Bananas, Blueberries, Honeydew melon, Pears, Raisins, Watermelon Vegetables - Broccoli, [address] Sprouts, [place], Carrots, Cauliflower, Green, Cucumber, Mushrooms, Peas, Radishes, Squash, Zucchini, White potatoes, Sweet potatoes & yams Poultry - Chicken, Eggs, [place], [employer] - Beef, Diplomatic Services operational officer, Lamb Seafood - Shrimp, [place] fish, Salmon Grains - Oat, Rice Snacks - Pretzels, Popcorn  *Lenward Chancellor et al. Diet and its role in interstitial cystitis/bladder pain syndrome (IC/BPS) and comorbid conditions. [employer]. [employer]JU Int. 2012 Jan 11.    Please try to start pelvic floor PT.   Please start estrogen cream 1g nightly for 2 weeks and then after the first 2 weeks go down to 1g twice weekly.   Try to decrease your intake of water in the evening. Stop green tea at supper.

## 2023-11-29 ENCOUNTER — Other Ambulatory Visit: Payer: Self-pay | Admitting: Internal Medicine

## 2023-11-29 ENCOUNTER — Encounter: Payer: Self-pay | Admitting: Physical Therapy

## 2023-11-29 ENCOUNTER — Other Ambulatory Visit: Payer: Self-pay

## 2023-11-29 ENCOUNTER — Ambulatory Visit: Attending: Obstetrics and Gynecology | Admitting: Physical Therapy

## 2023-11-29 DIAGNOSIS — M6281 Muscle weakness (generalized): Secondary | ICD-10-CM | POA: Insufficient documentation

## 2023-11-29 DIAGNOSIS — M6289 Other specified disorders of muscle: Secondary | ICD-10-CM | POA: Insufficient documentation

## 2023-11-29 DIAGNOSIS — Z1231 Encounter for screening mammogram for malignant neoplasm of breast: Secondary | ICD-10-CM

## 2023-11-29 DIAGNOSIS — R252 Cramp and spasm: Secondary | ICD-10-CM | POA: Diagnosis not present

## 2023-11-29 NOTE — Therapy (Unsigned)
 OUTPATIENT PHYSICAL THERAPY FEMALE PELVIC EVALUATION   Patient Name: Frances [name] MRN: [id number] DOB:1934/01/11, 89 y.o., female Today's Date: 11/29/2023  END OF SESSION:  PT End of Session - 11/29/23 1619     Visit Number 1    Date for PT Re-Evaluation 02/21/24    Authorization Type Medicare/[employer]    Authorization - Visit Number 1    Authorization - Number of Visits 10    PT Start Time 1615    PT Stop Time 1655    PT Time Calculation (min) 40 min    Activity Tolerance Patient tolerated treatment well    Behavior During Therapy [facility] for tasks assessed/performed             Past Medical History:  Diagnosis Date   Arthritis    hands, back   Asthmatic bronchitis    Atrial fibrillation (HCC) 07/31/2013   Chronic anticoagulation 07/30/2014   Chronic kidney disease    stage 3 renal disease - no med   Dizziness 07/30/2014   Dysrhythmia    Hx - a-fib 05/2010 - tx with meds, no problem since 05/2010   First degree AV block 07/30/2014   Hearing loss    bilateral hearing aids   Hypertension    controlled with meds   Hypothyroidism    Internal hemorrhoid    PAF (paroxysmal atrial fibrillation) (HCC) 11/28/2015   Paroxysmal atrial fibrillation (HCC)    Peripheral vascular disease (HCC)    right arm and right shoulder blood clots r/t a fall   Persistent atrial fibrillation (HCC) 09/28/2017   Rectal bleeding    SVD (spontaneous vaginal delivery) 1957, 1959   x 2   Thyroid disease    hypothyroidism   Past Surgical History:  Procedure Laterality Date   ABDOMINAL HYSTERECTOMY     and right ovary   ANTERIOR AND POSTERIOR REPAIR N/A 01/03/2014   Procedure: ANTERIOR (CYSTOCELE) REPAIR;  Surgeon: Tresa Endo A. Ernestina Penna, MD;  Location: [facility];  Service: Gynecology;  Laterality: N/A;   APPENDECTOMY     ATRIAL FIBRILLATION ABLATION N/A 09/28/2017   Procedure: ATRIAL FIBRILLATION ABLATION;  Surgeon: Hillis Range, MD;  Location: [facility];  Service: Cardiovascular;   Laterality: N/A;   BACK SURGERY     x 3 - 2 rods and 8 screws   CARDIOVERSION N/A 08/24/2017   Procedure: CARDIOVERSION;  Surgeon: Jake Bathe, MD;  Location: [facility] ENDOSCOPY;  Service: Cardiovascular;  Laterality: N/A;   CARDIOVERSION N/A 10/19/2017   Procedure: CARDIOVERSION;  Surgeon: Wendall Stade, MD;  Location: [facility] ENDOSCOPY;  Service: Cardiovascular;  Laterality: N/A;   CARDIOVERSION N/A 10/04/2019   Procedure: CARDIOVERSION;  Surgeon: Chrystie Nose, MD;  Location: [facility] ENDOSCOPY;  Service: Cardiovascular;  Laterality: N/A;   CARDIOVERSION N/A 08/20/2021   Procedure: CARDIOVERSION;  Surgeon: Jake Bathe, MD;  Location: [facility] ENDOSCOPY;  Service: Cardiovascular;  Laterality: N/A;   CARDIOVERSION N/A 07/02/2022   Procedure: CARDIOVERSION;  Surgeon: Sande Rives, MD;  Location: [facility] ENDOSCOPY;  Service: Cardiovascular;  Laterality: N/A;   CARDIOVERSION N/A 10/01/2022   Procedure: CARDIOVERSION;  Surgeon: Jake Bathe, MD;  Location: [facility] ENDOSCOPY;  Service: Cardiovascular;  Laterality: N/A;   COLONOSCOPY     CYSTOCELE REPAIR     DENTAL SURGERY     infected tooth - general   DILATION AND CURETTAGE OF UTERUS     x several   ELECTROPHYSIOLOGIC STUDY N/A 11/28/2015   Procedure: Atrial Fibrillation Ablation;  Surgeon: Hillis Range, MD;  Location: [facility];  Service: Cardiovascular;  Laterality: N/A;   EP IMPLANTABLE DEVICE N/A 06/08/2016   Procedure: Loop Recorder Insertion;  Surgeon: Hillis Range, MD;  Location: [facility];  Service: Cardiovascular;  Laterality: N/A;   EXAMINATION UNDER ANESTHESIA N/A 01/15/2014   Procedure: EXAM UNDER ANESTHESIA with evacuation of hematoma and over sewing of vaginal mucosa.;  Surgeon: Alphonsus Sias. Ernestina Penna, MD;  Location: [facility];  Service: Gynecology;  Laterality: N/A;   HAND SURGERY     left   hemorrhoid injection  04/2011   implantable loop recorder removal  08/06/2021   MDT Reveal LINQ removed by Dr Johney Frame   JOINT  REPLACEMENT     left thumb replacement   MYRINGOTOMY WITH TUBE PLACEMENT Right 10/27/2018   TEE WITHOUT CARDIOVERSION N/A 09/27/2017   Procedure: TRANSESOPHAGEAL ECHOCARDIOGRAM (TEE);  Surgeon: Quintella Reichert, MD;  Location: [facility] ENDOSCOPY;  Service: Cardiovascular;  Laterality: N/A;   TONSILLECTOMY     Patient Active Problem List   Diagnosis Date Noted   Atrial fibrillation and flutter (HCC) 08/19/2022   Hypercoagulable state due to atrial fibrillation (HCC) 10/03/2021   Coronary artery disease involving native coronary artery of native heart without angina pectoris 09/12/2020   Atrial flutter (HCC)    Persistent atrial fibrillation (HCC) 09/28/2017   PAF (paroxysmal atrial fibrillation) (HCC) 11/28/2015   First degree AV block 07/30/2014   Chronic anticoagulation 07/30/2014   Dizziness 07/30/2014   Post-op bleeding 01/15/2014   Cystocele 01/03/2014   Atrial fibrillation (HCC) 07/31/2013   Hypertension    Thyroid disease    Chronic kidney disease    Internal hemorrhoid    Rectal bleeding    Asthmatic bronchitis     PCP: Thana Ates, MD  REFERRING PROVIDER: Selmer Dominion, NP   REFERRING DIAG:  N81.10 (ICD-10-CM) - Prolapse of anterior vaginal wall  M62.89 (ICD-10-CM) - Pelvic floor dysfunction    THERAPY DIAG:  Muscle weakness (generalized)  Pelvic floor dysfunction  Cramp and spasm  Rationale for Evaluation and Treatment: Rehabilitation  ONSET DATE: 2023  SUBJECTIVE:                                                                                                                                                                                           SUBJECTIVE STATEMENT: I have had a lot of UTI's. The doctor said I vaginal atrophy. Her son has Parkinson's and assist him with lifting.  Fluid intake: Drinks: Hot Chocolate/Milk/OJ in AM, 32-40oz Water, 8oz Coke Zero, Tumbler of Green Tea at supper per day   PAIN:  Are you having pain? No  PRECAUTIONS:  None  RED FLAGS: None   WEIGHT BEARING RESTRICTIONS: No  FALLS:  Has patient fallen in last 6 months? No  OCCUPATION: retired  ACTIVITY LEVEL : sedentary  PLOF: Independent  PATIENT GOALS: avoid surgery  PERTINENT HISTORY:  Atrial fibrillation; chronic kidney disease; Hypothyroidism; Paroxysmal atrial fibrillation; abdominal hysterectomy; anterior and posterior repair 2015; appendectomy; back sugery x3 2 rods  Sexual abuse: No  BOWEL MOVEMENT: Pain with bowel movement: No Type of bowel movement:Strain none Fully empty rectum: No, sometimes goes to urinate and small amount of stool will come out Leakage: Occurs: with gas  Pads: No Fiber supplement/laxative Yes , Benefiber  URINATION: Pain with urination: No Fully empty bladder: Noneed to urinate multiple times in a row  Stream: Weak Urgency: No Frequency: Day time voids 6-9.  Nocturia: 3/4 times per night to void.  Leakage:  none Pads: No  INTERCOURSE: not active   PREGNANCY: Vaginal deliveries 2  PROLAPSE: Pressure   OBJECTIVE:  Note: Objective measures were completed at Evaluation unless otherwise noted.  DIAGNOSTIC FINDINGS:  Pelvic floor strength I/V   Pelvic floor musculature: Right levator tender, Right obturator non-tender, Left levator tender, Left obturator non-tender noted dyssynergia when asking the patient to bear down.  Post Void Residual 19 mL  Patient has stage I-II/IV anterior vaginal, I/IV apical, and 0/IV posterior prolapse.   COGNITION: Overall cognitive status: Within functional limits for tasks assessed     SENSATION: Light touch: Appears intact    POSTURE: flexed trunk    LUMBARAROM/PROM:has 8 screws and 2 rods in her back  A/PROM A/PROM  eval  Flexion full  Extension Decreased by 50%  Right lateral flexion Decreased by 50%  Left lateral flexion Decreased by 50%  Right rotation Decreased by 50%  Left rotation Decreased by 50%   (Blank rows = not tested)  LOWER  EXTREMITY WUJ:[id number] hip ROM is full   LOWER EXTREMITY MMT:  MMT Right eval Left eval  Hip extension 4/5 4/5   (Blank rows = not tested) PALPATION:   Pelvic Alignment: ASIS are equal  Abdominal: tenderness located in the lower abdomen, decreased movement of the lower rib cage, difficulty with contracting her lower abdomen                External Perineal Exam: tightness in the perineal body                             Internal Pelvic Floor: tenderness located in the left levator ani  Patient confirms identification and approves PT to assess internal pelvic floor and treatment Yes  PELVIC MMT:   MMT eval  Vaginal 1/5  (Blank rows = not tested)        TONE: average  PROLAPSE: Anterior wall weakness that comes to the introitus  TODAY'S TREATMENT:  DATE: 11/29/23  EVAL See below   PATIENT EDUCATION:  11/29/23 Education details: Access Code: [email or web address] Person educated: Patient Education method: Explanation, Demonstration, Tactile cues, Verbal cues, and Handouts Education comprehension: verbalized understanding, returned demonstration, verbal cues required, tactile cues required, and needs further education  HOME EXERCISE PROGRAM: 11/29/23 Access Code: [email or web address] URL: [email or web address] Date: 11/29/2023 Prepared by: Eulis Foster  Exercises - Supine Pelvic Floor Contraction  - 3 x daily - 7 x weekly - 1 sets - 10 reps - 3 sec hold - Hooklying Isometric Hip Flexion  - 1 x daily - 7 x weekly - 1 sets - 10 reps - 2 sec hold  ASSESSMENT:  CLINICAL IMPRESSION: Patient is a 89 y.o. female who was seen today for physical therapy evaluation and treatment for anterior wall weakness. Patient has a son at home that has Parkinson's and she assists him with transfers and lifts a lot. She feels her prolapse may of happened when she was  assisting in getting him up. Patient will urinate and not feel a small amount of stool come out. Patient does have to urinate several times in a row. Pelvic floor strength is 1/5. She has tenderness located in the right levator ani. Anterior wall weakness at the introitus when bearing down. Patient will benefit from skilled therapy to improve pelvic floor strength and education on prolapse management.   OBJECTIVE IMPAIRMENTS: decreased strength, increased fascial restrictions, and increased muscle spasms.   ACTIVITY LIMITATIONS: lifting and toileting  PARTICIPATION LIMITATIONS: community activity  PERSONAL FACTORS: 3+ comorbidities: Atrial fibrillation; chronic kidney disease; Hypothyroidism; Paroxysmal atrial fibrillation; abdominal hysterectomy; anterior and posterior repair 2015; appendectomy; back sugery x3 2 rods   are also affecting patient's functional outcome.   REHAB POTENTIAL: Excellent  CLINICAL DECISION MAKING: Evolving/moderate complexity  EVALUATION COMPLEXITY: Moderate   GOALS: Goals reviewed with patient? Yes  SHORT TERM GOALS: Target date: 12/27/23  Patient independent with initial HEP.  Baseline: Goal status: INITIAL  2.  Educated on prolapse management.  Baseline:  Goal status: INITIAL  3.  Understand how to lift without putting pressure on the pelvic floor.  Baseline:  Goal status: INITIAL   LONG TERM GOALS: Target date: 02/21/24  Patient independent with advanced HEP.  Baseline:  Goal status: INITIAL  2.  Patient is able to perform daily activities with correct pressure management to reduce strain on prolapse.  Baseline:  Goal status: INITIAL  3.  Pelvic floor strength is >/= 3/5 to assist in management of the prolapse.  Baseline:  Goal status: INITIAL  4.  Patient has improved lower rib cage mobility to perform diaphragmatic breathing to reduce pressure on the weakened area of the vaginal canal.  Baseline:  Goal status: INITIAL   PLAN:  PT  FREQUENCY: 1x/week  PT DURATION: 12 weeks  PLANNED INTERVENTIONS: 97110-Therapeutic exercises, 97530- Therapeutic activity, 97112- Neuromuscular re-education, 97535- Self Care, [id number]- Manual therapy, Patient/Family education, Dry Needling, Cryotherapy, Moist heat, and Biofeedback  PLAN FOR NEXT SESSION: work on diaphragmatic breathing, educated on laying down with feet up to reduce pressure on pelvic floor, hip stretches, lifting   Eulis Foster, PT 11/30/23 8:24 AM

## 2023-12-03 DIAGNOSIS — I700 Atherosclerosis of aorta: Secondary | ICD-10-CM | POA: Diagnosis not present

## 2023-12-03 DIAGNOSIS — I129 Hypertensive chronic kidney disease with stage 1 through stage 4 chronic kidney disease, or unspecified chronic kidney disease: Secondary | ICD-10-CM | POA: Diagnosis not present

## 2023-12-03 DIAGNOSIS — I480 Paroxysmal atrial fibrillation: Secondary | ICD-10-CM | POA: Diagnosis not present

## 2023-12-03 DIAGNOSIS — N1831 Chronic kidney disease, stage 3a: Secondary | ICD-10-CM | POA: Diagnosis not present

## 2023-12-06 ENCOUNTER — Ambulatory Visit: Admitting: Physical Therapy

## 2023-12-06 ENCOUNTER — Encounter: Payer: Self-pay | Admitting: Physical Therapy

## 2023-12-06 DIAGNOSIS — M6281 Muscle weakness (generalized): Secondary | ICD-10-CM | POA: Diagnosis not present

## 2023-12-06 DIAGNOSIS — R252 Cramp and spasm: Secondary | ICD-10-CM | POA: Diagnosis not present

## 2023-12-06 DIAGNOSIS — F320 Major depressive disorder, single episode, mild: Secondary | ICD-10-CM | POA: Diagnosis not present

## 2023-12-06 DIAGNOSIS — I480 Paroxysmal atrial fibrillation: Secondary | ICD-10-CM | POA: Diagnosis not present

## 2023-12-06 DIAGNOSIS — E039 Hypothyroidism, unspecified: Secondary | ICD-10-CM | POA: Diagnosis not present

## 2023-12-06 DIAGNOSIS — M6289 Other specified disorders of muscle: Secondary | ICD-10-CM | POA: Diagnosis not present

## 2023-12-06 DIAGNOSIS — E7800 Pure hypercholesterolemia, unspecified: Secondary | ICD-10-CM | POA: Diagnosis not present

## 2023-12-06 NOTE — Patient Instructions (Signed)
 The first picture shows that there is no effect on the pelvic floor with gravity eliminated. The next three show that with a wedge pillow or a few pillows from home under your pelvis the pelvic floor is inverted and may relax and allows gravity to help return prolapsed areas more inward to help relieve symptoms. Do this 15-20 mins every evening when symptoms tend to be worse. Stop if you have pain or negative symptoms.    About Pelvic Support Problems Pelvic Support Problems Explained Ligaments, muscles, and connective tissue normally hold your bladder, uterus, and other organs in their proper places in your pelvis. When these tissues become weak, a problem with pelvic support may result. Weak support can cause one or more of the pelvic organs to drop down into the vagina. An organ may even drop so far that is partially exposed outside the body.  Pelvic support problems are named by the change in the organ. The main types of pelvic support problems are:  Cystocele: When the bladder drops down into your vagina.  Enterocele: When your small intestine drops between your vagina and rectum.  Rectocele: When your rectum bulges into the vaginal wall.  Uterine prolapse: When your uterus drops into your vagina.  Vaginal prolapse: When the top part of the vagina begins to droop. This sometimes happens after a hysterectomy (removal of the uterus).  Causes Pelvic support problems can be caused by many conditions. They may begin after you give birth, especially if you had a large baby. During childbirth, the muscles and skin of the birth canal (vagina) are stretched and sometimes torn. They heal over time but are not always exactly the same. A long pushing stage of labor may also weaken these tissues as well as very rapid births as the tissues do not have time to stretch so they tear.  Also, after menopause, there are changes in the vaginal walls resulting from a decrease in estrogen. Estrogen helps to keep the  tissues toned. Low levels of estrogen weaken the vaginal walls and may cause the bladder to shift from its normal position. As women get older, the loss of muscle tone and the relaxation of muscles may cause the uterus or other organs to drop.  Over time, conditions like chronic coughing, chronic constipation, doing a lot of heavy lifting, straining to pass stool, and obesity, can also weaken the pelvic support muscles.  Diagnosing Pelvic Support Problems Your health care provider will ask about your symptoms and do a pelvic examination. Your provider may also do a rectal exam during your pelvic exam. Your provider may ask you to: 1. Bear down and push (like you are having a bowel movement) so he or she can see if your bladder or other part of your body protrudes into the vagina. 2. Contract the muscles of your pelvis to check the strength of your pelvic muscles.  3. Do several types of urine, nerve and muscle tests of the pelvis and around the bladder to see what type of treatment is best for you.   Symptoms Symptoms of pelvic support problems depend on the organ involved, but may include:  urine leakage  stain or fecal loss after a bowel movement trouble having bowel movements  ache in the lower abdomen, groin, or lower back  bladder infection  a feeling of heaviness, pulling, or fullness in the pelvis, or a feeling that something is falling out of the vagina  an organ protruding from your vaginal opening  feeling  the need to support the organs or perineal area to empty bladder or bowels painful sexual intercourse.  Many women feel pelvic pressure or trouble holding their urine immediately after childbirth. For some, these symptoms go away permanently, in others they return as they get older.  Treatment Options A prolapsed organ cannot repair itself. Contact your health care provider as soon as you notice symptoms of a problem. Treatment depends on what the specific problem is and how far  advanced it is.  The symptoms caused by some pelvic support problems may simply be treated with changes in diet, medicine to soften the stool, weight loss, or avoiding strenuous activities. You may also do pelvic floor exercises to help strengthen your pelvic muscles.  Some cases of prolapse may require a special support device made from plastic or rubber called a pessary that fits into the vagina to support the uterus, vagina, or bladder. A pessary can also help women who leak urine when coughing, straining, or exercising. In mild cases, a tampon or vaginal diaphragm may be used instead of a pessary.  Talk to your doctor or health care provider about these options. In serious cases, surgery may be needed to put the organs back into their proper place. The uterus may be removed because of the pressure it puts on the bladder.  Your doctor will know what surgery will be best for you. How can I prevent pelvic support problems?  You can help prevent pelvic support problems by:  maintaining a healthy lifestyle  continuing to do pelvic floor exercises after you deliver a baby  maintaining a healthy weight  avoiding a lot of heavy lifting and lifting with your legs (not from your waist)  treating constipation and avoid getting       [facility] [address] [place], [place] [address] Phone # [phone] Fax [phone]

## 2023-12-06 NOTE — Therapy (Signed)
 OUTPATIENT PHYSICAL THERAPY FEMALE PELVIC TREATMENT   Patient Name: GENE COLEE MRN: [id number] DOB:Feb 23, 1934, 89 y.o., female Today's Date: 12/06/2023  END OF SESSION:  PT End of Session - 12/06/23 1443     Visit Number 2    Date for PT Re-Evaluation 02/21/24    Authorization Type Medicare/[employer]    Authorization - Visit Number 2    Authorization - Number of Visits 10    PT Start Time 1445    PT Stop Time 1525    PT Time Calculation (min) 40 min    Activity Tolerance Patient tolerated treatment well    Behavior During Therapy [facility] for tasks assessed/performed             Past Medical History:  Diagnosis Date   Arthritis    hands, back   Asthmatic bronchitis    Atrial fibrillation (HCC) 07/31/2013   Chronic anticoagulation 07/30/2014   Chronic kidney disease    stage 3 renal disease - no med   Dizziness 07/30/2014   Dysrhythmia    Hx - a-fib 05/2010 - tx with meds, no problem since 05/2010   First degree AV block 07/30/2014   Hearing loss    bilateral hearing aids   Hypertension    controlled with meds   Hypothyroidism    Internal hemorrhoid    PAF (paroxysmal atrial fibrillation) (HCC) 11/28/2015   Paroxysmal atrial fibrillation (HCC)    Peripheral vascular disease (HCC)    right arm and right shoulder blood clots r/t a fall   Persistent atrial fibrillation (HCC) 09/28/2017   Rectal bleeding    SVD (spontaneous vaginal delivery) 1957, 1959   x 2   Thyroid disease    hypothyroidism   Past Surgical History:  Procedure Laterality Date   ABDOMINAL HYSTERECTOMY     and right ovary   ANTERIOR AND POSTERIOR REPAIR N/A 01/03/2014   Procedure: ANTERIOR (CYSTOCELE) REPAIR;  Surgeon: Tresa Endo A. Ernestina Penna, MD;  Location: [facility];  Service: Gynecology;  Laterality: N/A;   APPENDECTOMY     ATRIAL FIBRILLATION ABLATION N/A 09/28/2017   Procedure: ATRIAL FIBRILLATION ABLATION;  Surgeon: Hillis Range, MD;  Location: [facility];  Service: Cardiovascular;   Laterality: N/A;   BACK SURGERY     x 3 - 2 rods and 8 screws   CARDIOVERSION N/A 08/24/2017   Procedure: CARDIOVERSION;  Surgeon: Jake Bathe, MD;  Location: [facility] ENDOSCOPY;  Service: Cardiovascular;  Laterality: N/A;   CARDIOVERSION N/A 10/19/2017   Procedure: CARDIOVERSION;  Surgeon: Wendall Stade, MD;  Location: [facility] ENDOSCOPY;  Service: Cardiovascular;  Laterality: N/A;   CARDIOVERSION N/A 10/04/2019   Procedure: CARDIOVERSION;  Surgeon: Chrystie Nose, MD;  Location: [facility] ENDOSCOPY;  Service: Cardiovascular;  Laterality: N/A;   CARDIOVERSION N/A 08/20/2021   Procedure: CARDIOVERSION;  Surgeon: Jake Bathe, MD;  Location: [facility] ENDOSCOPY;  Service: Cardiovascular;  Laterality: N/A;   CARDIOVERSION N/A 07/02/2022   Procedure: CARDIOVERSION;  Surgeon: Sande Rives, MD;  Location: [facility] ENDOSCOPY;  Service: Cardiovascular;  Laterality: N/A;   CARDIOVERSION N/A 10/01/2022   Procedure: CARDIOVERSION;  Surgeon: Jake Bathe, MD;  Location: [facility] ENDOSCOPY;  Service: Cardiovascular;  Laterality: N/A;   COLONOSCOPY     CYSTOCELE REPAIR     DENTAL SURGERY     infected tooth - general   DILATION AND CURETTAGE OF UTERUS     x several   ELECTROPHYSIOLOGIC STUDY N/A 11/28/2015   Procedure: Atrial Fibrillation Ablation;  Surgeon: Hillis Range, MD;  Location: [facility];  Service: Cardiovascular;  Laterality: N/A;   EP IMPLANTABLE DEVICE N/A 06/08/2016   Procedure: Loop Recorder Insertion;  Surgeon: Hillis Range, MD;  Location: [facility];  Service: Cardiovascular;  Laterality: N/A;   EXAMINATION UNDER ANESTHESIA N/A 01/15/2014   Procedure: EXAM UNDER ANESTHESIA with evacuation of hematoma and over sewing of vaginal mucosa.;  Surgeon: Alphonsus Sias. Ernestina Penna, MD;  Location: [facility];  Service: Gynecology;  Laterality: N/A;   HAND SURGERY     left   hemorrhoid injection  04/2011   implantable loop recorder removal  08/06/2021   MDT Reveal LINQ removed by Dr Johney Frame   JOINT  REPLACEMENT     left thumb replacement   MYRINGOTOMY WITH TUBE PLACEMENT Right 10/27/2018   TEE WITHOUT CARDIOVERSION N/A 09/27/2017   Procedure: TRANSESOPHAGEAL ECHOCARDIOGRAM (TEE);  Surgeon: Quintella Reichert, MD;  Location: [facility] ENDOSCOPY;  Service: Cardiovascular;  Laterality: N/A;   TONSILLECTOMY     Patient Active Problem List   Diagnosis Date Noted   Atrial fibrillation and flutter (HCC) 08/19/2022   Hypercoagulable state due to atrial fibrillation (HCC) 10/03/2021   Coronary artery disease involving native coronary artery of native heart without angina pectoris 09/12/2020   Atrial flutter (HCC)    Persistent atrial fibrillation (HCC) 09/28/2017   PAF (paroxysmal atrial fibrillation) (HCC) 11/28/2015   First degree AV block 07/30/2014   Chronic anticoagulation 07/30/2014   Dizziness 07/30/2014   Post-op bleeding 01/15/2014   Cystocele 01/03/2014   Atrial fibrillation (HCC) 07/31/2013   Hypertension    Thyroid disease    Chronic kidney disease    Internal hemorrhoid    Rectal bleeding    Asthmatic bronchitis     PCP: Thana Ates, MD  REFERRING PROVIDER: Selmer Dominion, NP   REFERRING DIAG:  N81.10 (ICD-10-CM) - Prolapse of anterior vaginal wall  M62.89 (ICD-10-CM) - Pelvic floor dysfunction    THERAPY DIAG:  Muscle weakness (generalized)  Pelvic floor dysfunction  Cramp and spasm  Rationale for Evaluation and Treatment: Rehabilitation  ONSET DATE: 2023  SUBJECTIVE:                                                                                                                                                                                           SUBJECTIVE STATEMENT: I was uncomfortable in the vaginal area and went to bed early. I have more discomfort when I sit and stand a lot.  Fluid intake: Drinks: Hot Chocolate/Milk/OJ in AM, 32-40oz Water, 8oz Coke Zero, Tumbler of Green Tea at supper per day   PAIN:  Are you having pain? No  PRECAUTIONS:  None  RED FLAGS: None   WEIGHT BEARING RESTRICTIONS: No  FALLS:  Has patient fallen in last 6 months? No  OCCUPATION: retired  ACTIVITY LEVEL : sedentary  PLOF: Independent  PATIENT GOALS: avoid surgery  PERTINENT HISTORY:  Atrial fibrillation; chronic kidney disease; Hypothyroidism; Paroxysmal atrial fibrillation; abdominal hysterectomy; anterior and posterior repair 2015; appendectomy; back sugery x3 2 rods  Sexual abuse: No  BOWEL MOVEMENT: Pain with bowel movement: No Type of bowel movement:Strain none Fully empty rectum: No, sometimes goes to urinate and small amount of stool will come out Leakage: Occurs: with gas  Pads: No Fiber supplement/laxative Yes , Benefiber  URINATION: Pain with urination: No Fully empty bladder: Noneed to urinate multiple times in a row  Stream: Weak Urgency: No Frequency: Day time voids 6-9.  Nocturia: 3/4 times per night to void.  Leakage:  none Pads: No  INTERCOURSE: not active   PREGNANCY: Vaginal deliveries 2  PROLAPSE: Pressure   OBJECTIVE:  Note: Objective measures were completed at Evaluation unless otherwise noted.  DIAGNOSTIC FINDINGS:  Pelvic floor strength I/V   Pelvic floor musculature: Right levator tender, Right obturator non-tender, Left levator tender, Left obturator non-tender noted dyssynergia when asking the patient to bear down.  Post Void Residual 19 mL  Patient has stage I-II/IV anterior vaginal, I/IV apical, and 0/IV posterior prolapse.   COGNITION: Overall cognitive status: Within functional limits for tasks assessed     SENSATION: Light touch: Appears intact    POSTURE: flexed trunk    LUMBARAROM/PROM:has 8 screws and 2 rods in her back  A/PROM A/PROM  eval  Flexion full  Extension Decreased by 50%  Right lateral flexion Decreased by 50%  Left lateral flexion Decreased by 50%  Right rotation Decreased by 50%  Left rotation Decreased by 50%   (Blank rows = not tested)  LOWER  EXTREMITY JJO:[id number] hip ROM is full   LOWER EXTREMITY MMT:  MMT Right eval Left eval  Hip extension 4/5 4/5   (Blank rows = not tested) PALPATION:   Pelvic Alignment: ASIS are equal  Abdominal: tenderness located in the lower abdomen, decreased movement of the lower rib cage, difficulty with contracting her lower abdomen                External Perineal Exam: tightness in the perineal body                             Internal Pelvic Floor: tenderness located in the left levator ani  Patient confirms identification and approves PT to assess internal pelvic floor and treatment Yes  PELVIC MMT:   MMT eval  Vaginal 1/5  (Blank rows = not tested)        TONE: average  PROLAPSE: Anterior wall weakness that comes to the introitus  TODAY'S TREATMENT:     12/06/23 Manual: Soft tissue mobilization: Circular massage of the abdomen to improve tissue mobility Manual work to the diaphragm to elongate for breath Scar tissue mobilization: Scar release of the lower abdomen to improve tissue mobility Myofascial release: Fascial release of the lower abdomen to reduce scar tissue and tightness Neuromuscular re-education: Down training: Diaphragmatic breathing supine and sitting to feel the pelvic floor drop Therapeutic activities: Functional strengthening activities: Laying on back with legs elevated during the day and night to reduce pressure of the prolapse Educated patient on prolapse management  DATE: 11/29/23  EVAL See below   PATIENT EDUCATION:  12/06/23 Education details: Access Code: [email or web address]; prolapse management Person educated: Patient Education method: Explanation, Demonstration, Tactile cues, Verbal cues, and Handouts Education comprehension: verbalized understanding, returned demonstration, verbal cues required, tactile cues required, and  needs further education  HOME EXERCISE PROGRAM: 12/06/23 Access Code: [email or web address] URL: [email or web address] Date: 12/06/2023 Prepared by: Eulis Foster  Exercises - Supine Pelvic Floor Contraction  - 3 x daily - 7 x weekly - 1 sets - 10 reps - 3 sec hold - Hooklying Isometric Hip Flexion  - 1 x daily - 7 x weekly - 1 sets - 10 reps - 2 sec hold - Seated Diaphragmatic Breathing  - 3 x daily - 7 x weekly - 1 sets - 10 reps  ASSESSMENT:  CLINICAL IMPRESSION: Patient is a 89 y.o. female who was seen today for physical therapy  treatment for anterior wall weakness. Patient has improved tissue mobility of the lower abdomen after the manual work to help the bladder move with urination. Patient was able to feel the pelvic floor relax with diaphragmatic breathing. She has reduced tightness in the abdomen and the diaphragm. She is starting to learn about prolapse management but requires more education. Patient will benefit from skilled therapy to improve pelvic floor strength and education on prolapse management.   OBJECTIVE IMPAIRMENTS: decreased strength, increased fascial restrictions, and increased muscle spasms.   ACTIVITY LIMITATIONS: lifting and toileting  PARTICIPATION LIMITATIONS: community activity  PERSONAL FACTORS: 3+ comorbidities: Atrial fibrillation; chronic kidney disease; Hypothyroidism; Paroxysmal atrial fibrillation; abdominal hysterectomy; anterior and posterior repair 2015; appendectomy; back sugery x3 2 rods   are also affecting patient's functional outcome.   REHAB POTENTIAL: Excellent  CLINICAL DECISION MAKING: Evolving/moderate complexity  EVALUATION COMPLEXITY: Moderate   GOALS: Goals reviewed with patient? Yes  SHORT TERM GOALS: Target date: 12/27/23  Patient independent with initial HEP.  Baseline: Goal status: INITIAL  2.  Educated on prolapse management.  Baseline:  Goal status: INITIAL  3.  Understand how to lift without putting pressure on  the pelvic floor.  Baseline:  Goal status: INITIAL   LONG TERM GOALS: Target date: 02/21/24  Patient independent with advanced HEP.  Baseline:  Goal status: INITIAL  2.  Patient is able to perform daily activities with correct pressure management to reduce strain on prolapse.  Baseline:  Goal status: INITIAL  3.  Pelvic floor strength is >/= 3/5 to assist in management of the prolapse.  Baseline:  Goal status: INITIAL  4.  Patient has improved lower rib cage mobility to perform diaphragmatic breathing to reduce pressure on the weakened area of the vaginal canal.  Baseline:  Goal status: INITIAL   PLAN:  PT FREQUENCY: 1x/week  PT DURATION: 12 weeks  PLANNED INTERVENTIONS: 97110-Therapeutic exercises, 97530- Therapeutic activity, 97112- Neuromuscular re-education, 97535- Self Care, [id number]- Manual therapy, Patient/Family education, Dry Needling, Cryotherapy, Moist heat, and Biofeedback  PLAN FOR NEXT SESSION: work on diaphragmatic breathing, hip stretches, lifting with correct breath   Eulis Foster, PT 12/06/23 3:31 PM

## 2023-12-07 ENCOUNTER — Ambulatory Visit
Admission: RE | Admit: 2023-12-07 | Discharge: 2023-12-07 | Disposition: A | Discharge status: Home / Self Care | Source: Ambulatory Visit | Attending: Internal Medicine | Admitting: Internal Medicine

## 2023-12-07 DIAGNOSIS — Z1231 Encounter for screening mammogram for malignant neoplasm of breast: Secondary | ICD-10-CM

## 2024-01-01 DIAGNOSIS — I480 Paroxysmal atrial fibrillation: Secondary | ICD-10-CM | POA: Diagnosis not present

## 2024-01-01 DIAGNOSIS — I700 Atherosclerosis of aorta: Secondary | ICD-10-CM | POA: Diagnosis not present

## 2024-01-01 DIAGNOSIS — I129 Hypertensive chronic kidney disease with stage 1 through stage 4 chronic kidney disease, or unspecified chronic kidney disease: Secondary | ICD-10-CM | POA: Diagnosis not present

## 2024-01-01 DIAGNOSIS — N1831 Chronic kidney disease, stage 3a: Secondary | ICD-10-CM | POA: Diagnosis not present

## 2024-01-05 DIAGNOSIS — I129 Hypertensive chronic kidney disease with stage 1 through stage 4 chronic kidney disease, or unspecified chronic kidney disease: Secondary | ICD-10-CM | POA: Diagnosis not present

## 2024-01-05 DIAGNOSIS — E7800 Pure hypercholesterolemia, unspecified: Secondary | ICD-10-CM | POA: Diagnosis not present

## 2024-01-05 DIAGNOSIS — F320 Major depressive disorder, single episode, mild: Secondary | ICD-10-CM | POA: Diagnosis not present

## 2024-01-05 DIAGNOSIS — I700 Atherosclerosis of aorta: Secondary | ICD-10-CM | POA: Diagnosis not present

## 2024-01-05 DIAGNOSIS — N1831 Chronic kidney disease, stage 3a: Secondary | ICD-10-CM | POA: Diagnosis not present

## 2024-01-05 DIAGNOSIS — E039 Hypothyroidism, unspecified: Secondary | ICD-10-CM | POA: Diagnosis not present

## 2024-01-05 DIAGNOSIS — I480 Paroxysmal atrial fibrillation: Secondary | ICD-10-CM | POA: Diagnosis not present

## 2024-01-11 DIAGNOSIS — J0101 Acute recurrent maxillary sinusitis: Secondary | ICD-10-CM | POA: Diagnosis not present

## 2024-01-11 DIAGNOSIS — J069 Acute upper respiratory infection, unspecified: Secondary | ICD-10-CM | POA: Diagnosis not present

## 2024-01-19 ENCOUNTER — Other Ambulatory Visit (HOSPITAL_COMMUNITY): Payer: Self-pay | Admitting: *Deleted

## 2024-01-19 MED ORDER — AMIODARONE HCL 200 MG PO TABS: 100.0000 mg | ORAL_TABLET | Freq: Every day | ORAL | 2 refills | Status: DC

## 2024-01-20 DIAGNOSIS — H6991 Unspecified Eustachian tube disorder, right ear: Secondary | ICD-10-CM | POA: Diagnosis not present

## 2024-01-20 DIAGNOSIS — H6591 Unspecified nonsuppurative otitis media, right ear: Secondary | ICD-10-CM | POA: Diagnosis not present

## 2024-01-20 DIAGNOSIS — H9193 Unspecified hearing loss, bilateral: Secondary | ICD-10-CM | POA: Diagnosis not present

## 2024-01-24 ENCOUNTER — Ambulatory Visit: Attending: Obstetrics and Gynecology | Admitting: Physical Therapy

## 2024-01-24 ENCOUNTER — Encounter: Payer: Self-pay | Admitting: Physical Therapy

## 2024-01-24 ENCOUNTER — Ambulatory Visit: Admitting: Physical Therapy

## 2024-01-24 DIAGNOSIS — M6281 Muscle weakness (generalized): Secondary | ICD-10-CM | POA: Insufficient documentation

## 2024-01-24 DIAGNOSIS — M6289 Other specified disorders of muscle: Secondary | ICD-10-CM | POA: Insufficient documentation

## 2024-01-24 DIAGNOSIS — R252 Cramp and spasm: Secondary | ICD-10-CM | POA: Insufficient documentation

## 2024-01-24 NOTE — Patient Instructions (Addendum)
 Bladder Irritants  Certain foods and beverages can be irritating to the bladder.  Avoiding these irritants may decrease your symptoms of urinary urgency, frequency or bladder pain.  Even reducing your intake can help with your symptoms.  Not everyone is sensitive to all bladder irritants, so you may consider focusing on one irritant at a time, removing or reducing your intake of that irritant for 7-10 days to see if this change helps your symptoms.  Water intake is also very important.  Below is a list of bladder irritants.  Drinks: alcohol, carbonated beverages, caffeinated beverages such as coffee and tea, drinks with artificial sweeteners, citrus juices, apple juice, tomato juice  Foods: tomatoes and tomato based foods, spicy food, sugar and artificial sweeteners, vinegar, chocolate, raw onion, apples, citrus fruits, pineapple, cranberries, tomatoes, strawberries, plums, peaches, cantaloupe  Other: acidic urine (too concentrated) - see water intake info below  Substitutes you can try that are NOT irritating to the bladder: cooked onion, pears, papayas, sun-brewed decaf teas, watermelons, non-citrus herbal teas, apricots, kava and low-acid instant drinks (Postum).  [facility] [address] [place], [place] [address] Phone # [phone] Fax [phone]

## 2024-01-24 NOTE — Therapy (Signed)
 OUTPATIENT PHYSICAL THERAPY FEMALE PELVIC TREATMENT   Patient Name: Frances [name] MRN: [id number] DOB:1933-09-29, 89 y.o., female Today's Date: 01/24/2024  END OF SESSION:  PT End of Session - 01/24/24 1146     Visit Number 3    Date for PT Re-Evaluation 02/21/24    Authorization Type Medicare/[employer]    Authorization - Visit Number 3    Authorization - Number of Visits 10    PT Start Time 1145    PT Stop Time 1225    PT Time Calculation (min) 40 min    Activity Tolerance Patient tolerated treatment well    Behavior During Therapy [place] for tasks assessed/performed             Past Medical History:  Diagnosis Date   Arthritis    hands, back   Asthmatic bronchitis    Atrial fibrillation (HCC) 07/31/2013   Chronic anticoagulation 07/30/2014   Chronic kidney disease    stage 3 renal disease - no med   Dizziness 07/30/2014   Dysrhythmia    Hx - a-fib 05/2010 - tx with meds, no problem since 05/2010   First degree AV block 07/30/2014   Hearing loss    bilateral hearing aids   Hypertension    controlled with meds   Hypothyroidism    Internal hemorrhoid    PAF (paroxysmal atrial fibrillation) (HCC) 11/28/2015   Paroxysmal atrial fibrillation (HCC)    Peripheral vascular disease (HCC)    right arm and right shoulder blood clots r/t a fall   Persistent atrial fibrillation (HCC) 09/28/2017   Rectal bleeding    SVD (spontaneous vaginal delivery) 1957, 1959   x 2   Thyroid  disease    hypothyroidism   Past Surgical History:  Procedure Laterality Date   ABDOMINAL HYSTERECTOMY     and right ovary   ANTERIOR AND POSTERIOR REPAIR N/A 01/03/2014   Procedure: ANTERIOR (CYSTOCELE) REPAIR;  Surgeon: Loetta Ringer A. Johnston Nao, MD;  Location: [facility];  Service: Gynecology;  Laterality: N/A;   APPENDECTOMY     ATRIAL FIBRILLATION ABLATION N/A 09/28/2017   Procedure: ATRIAL FIBRILLATION ABLATION;  Surgeon: Jolly Needle, MD;  Location: [facility];  Service: Cardiovascular;   Laterality: N/A;   BACK SURGERY     x 3 - 2 rods and 8 screws   CARDIOVERSION N/A 08/24/2017   Procedure: CARDIOVERSION;  Surgeon: Hugh Madura, MD;  Location: [facility] ENDOSCOPY;  Service: Cardiovascular;  Laterality: N/A;   CARDIOVERSION N/A 10/19/2017   Procedure: CARDIOVERSION;  Surgeon: Loyde Rule, MD;  Location: [facility];  Service: Cardiovascular;  Laterality: N/A;   CARDIOVERSION N/A 10/04/2019   Procedure: CARDIOVERSION;  Surgeon: Hazle Lites, MD;  Location: [facility] ENDOSCOPY;  Service: Cardiovascular;  Laterality: N/A;   CARDIOVERSION N/A 08/20/2021   Procedure: CARDIOVERSION;  Surgeon: Hugh Madura, MD;  Location: [facility] ENDOSCOPY;  Service: Cardiovascular;  Laterality: N/A;   CARDIOVERSION N/A 07/02/2022   Procedure: CARDIOVERSION;  Surgeon: Harrold Lincoln, MD;  Location: [facility] ENDOSCOPY;  Service: Cardiovascular;  Laterality: N/A;   CARDIOVERSION N/A 10/01/2022   Procedure: CARDIOVERSION;  Surgeon: Hugh Madura, MD;  Location: [facility] ENDOSCOPY;  Service: Cardiovascular;  Laterality: N/A;   COLONOSCOPY     CYSTOCELE REPAIR     DENTAL SURGERY     infected tooth - general   DILATION AND CURETTAGE OF UTERUS     x several   ELECTROPHYSIOLOGIC STUDY N/A 11/28/2015   Procedure: Atrial Fibrillation Ablation;  Surgeon: Jolly Needle, MD;  Location: [facility];  Service: Cardiovascular;  Laterality: N/A;   EP IMPLANTABLE DEVICE N/A 06/08/2016   Procedure: Loop Recorder Insertion;  Surgeon: Jolly Needle, MD;  Location: [facility];  Service: Cardiovascular;  Laterality: N/A;   EXAMINATION UNDER ANESTHESIA N/A 01/15/2014   Procedure: EXAM UNDER ANESTHESIA with evacuation of hematoma and over sewing of vaginal mucosa.;  Surgeon: Marshel Skeeters. Johnston Nao, MD;  Location: [facility];  Service: Gynecology;  Laterality: N/A;   HAND SURGERY     left   hemorrhoid injection  04/2011   implantable loop recorder removal  08/06/2021   MDT Reveal LINQ removed by Dr Nunzio Belch   JOINT  REPLACEMENT     left thumb replacement   MYRINGOTOMY WITH TUBE PLACEMENT Right 10/27/2018   TEE WITHOUT CARDIOVERSION N/A 09/27/2017   Procedure: TRANSESOPHAGEAL ECHOCARDIOGRAM (TEE);  Surgeon: Jacqueline Matsu, MD;  Location: [facility] ENDOSCOPY;  Service: Cardiovascular;  Laterality: N/A;   TONSILLECTOMY     Patient Active Problem List   Diagnosis Date Noted   Atrial fibrillation and flutter (HCC) 08/19/2022   Hypercoagulable state due to atrial fibrillation (HCC) 10/03/2021   Coronary artery disease involving native coronary artery of native heart without angina pectoris 09/12/2020   Atrial flutter (HCC)    Persistent atrial fibrillation (HCC) 09/28/2017   PAF (paroxysmal atrial fibrillation) (HCC) 11/28/2015   First degree AV block 07/30/2014   Chronic anticoagulation 07/30/2014   Dizziness 07/30/2014   Post-op bleeding 01/15/2014   Cystocele 01/03/2014   Atrial fibrillation (HCC) 07/31/2013   Hypertension    Thyroid  disease    Chronic kidney disease    Internal hemorrhoid    Rectal bleeding    Asthmatic bronchitis     PCP: Tena Feeling, MD  REFERRING PROVIDER: Zuleta, Kaitlin G, NP   REFERRING DIAG:  N81.10 (ICD-10-CM) - Prolapse of anterior vaginal wall  M62.89 (ICD-10-CM) - Pelvic floor dysfunction    THERAPY DIAG:  Muscle weakness (generalized)  Pelvic floor dysfunction  Cramp and spasm  Rationale for Evaluation and Treatment: Rehabilitation  ONSET DATE: 2023  SUBJECTIVE:                                                                                                                                                                                           SUBJECTIVE STATEMENT: I do not have to urinate as much. I wake up 2-3 times per night.  Fluid intake: Drinks: Hot Chocolate/Milk/OJ in AM, 32-40oz Water, 8oz Coke Zero, Tumbler of Green Tea at supper per day   PAIN:  Are you having pain? No  PRECAUTIONS: None  RED FLAGS: None  WEIGHT BEARING  RESTRICTIONS: No  FALLS:  Has patient fallen in last 6 months? No  OCCUPATION: retired  ACTIVITY LEVEL : sedentary  PLOF: Independent  PATIENT GOALS: avoid surgery  PERTINENT HISTORY:  Atrial fibrillation; chronic kidney disease; Hypothyroidism; Paroxysmal atrial fibrillation; abdominal hysterectomy; anterior and posterior repair 2015; appendectomy; back sugery x3 2 rods  Sexual abuse: No  BOWEL MOVEMENT: Pain with bowel movement: No Type of bowel movement:Strain none Fully empty rectum: No, sometimes goes to urinate and small amount of stool will come out Leakage: Occurs: with gas  Pads: No Fiber supplement/laxative Yes , Benefiber  URINATION: Pain with urination: No Fully empty bladder: Noneed to urinate multiple times in a row  Stream: Weak Urgency: No Frequency: Day time voids 6-9.  Nocturia: 3/4 times per night to void.  Leakage: none Pads: No  INTERCOURSE: not active   PREGNANCY: Vaginal deliveries 2  PROLAPSE: Pressure   OBJECTIVE:  Note: Objective measures were completed at Evaluation unless otherwise noted.  DIAGNOSTIC FINDINGS:  Pelvic floor strength I/V   Pelvic floor musculature: Right levator tender, Right obturator non-tender, Left levator tender, Left obturator non-tender noted dyssynergia when asking the patient to bear down.  Post Void Residual 19 mL  Patient has stage I-II/IV anterior vaginal, I/IV apical, and 0/IV posterior prolapse.   COGNITION: Overall cognitive status: Within functional limits for tasks assessed     SENSATION: Light touch: Appears intact    POSTURE: flexed trunk    LUMBARAROM/PROM:has 8 screws and 2 rods in her back  A/PROM A/PROM  eval  Flexion full  Extension Decreased by 50%  Right lateral flexion Decreased by 50%  Left lateral flexion Decreased by 50%  Right rotation Decreased by 50%  Left rotation Decreased by 50%   (Blank rows = not tested)  LOWER EXTREMITY ZOX:[id number] hip ROM is  full   LOWER EXTREMITY MMT:  MMT Right eval Left eval  Hip extension 4/5 4/5   (Blank rows = not tested) PALPATION:   Pelvic Alignment: ASIS are equal  Abdominal: tenderness located in the lower abdomen, decreased movement of the lower rib cage, difficulty with contracting her lower abdomen                External Perineal Exam: tightness in the perineal body                             Internal Pelvic Floor: tenderness located in the left levator ani  Patient confirms identification and approves PT to assess internal pelvic floor and treatment Yes  PELVIC MMT:   MMT eval  Vaginal 1/5  (Blank rows = not tested)        TONE: average  PROLAPSE: Anterior wall weakness that comes to the introitus  TODAY'S TREATMENT:   01/24/24 Manual: Soft tissue mobilization: Circular massage of the abdomen to improve tissue mobility Manual work to the diaphragm to elongate for breath Myofascial release: Fascial release of the lower abdomen to reduce scar tissue and tightness Therapeutic activities: Functional strengthening activities: Educated patient on lifting her sone with breathing out, keeping the distance of between the rib cage and pubic bone, breath out and use legs.  Educated patient to lay down for 1 hour daily to reduce pressure on the prolapse and pelvic floor Self-care: Discussed with patient on bladder irritants and how it can affect the bladder Educated patient on going back to sleep when she wakes up instead of going to  the bathroom without the urge.       12/06/23 Manual: Soft tissue mobilization: Circular massage of the abdomen to improve tissue mobility Manual work to the diaphragm to elongate for breath Scar tissue mobilization: Scar release of the lower abdomen to improve tissue mobility Myofascial release: Fascial release of the lower abdomen to reduce scar tissue and tightness Neuromuscular re-education: Down training: Diaphragmatic breathing supine  and sitting to feel the pelvic floor drop Therapeutic activities: Functional strengthening activities: Laying on back with legs elevated during the day and night to reduce pressure of the prolapse Educated patient on prolapse management                                                                                                                           DATE: 11/29/23  EVAL See below   PATIENT EDUCATION:  12/06/23 Education details: Access Code: [email or web address]; prolapse management Person educated: Patient Education method: Explanation, Demonstration, Tactile cues, Verbal cues, and Handouts Education comprehension: verbalized understanding, returned demonstration, verbal cues required, tactile cues required, and needs further education  HOME EXERCISE PROGRAM: 12/06/23 Access Code: [email or web address] URL: [email or web address] Date: 12/06/2023 Prepared by: Marsha Skeen  Exercises - Supine Pelvic Floor Contraction  - 3 x daily - 7 x weekly - 1 sets - 10 reps - 3 sec hold - Hooklying Isometric Hip Flexion  - 1 x daily - 7 x weekly - 1 sets - 10 reps - 2 sec hold - Seated Diaphragmatic Breathing  - 3 x daily - 7 x weekly - 1 sets - 10 reps  ASSESSMENT:  CLINICAL IMPRESSION: Patient is a 89 y.o. female who was seen today for physical therapy  treatment for anterior wall weakness. She is having less urgency and not having to urinate as often. She still has to sit on the commode for an extra minute to fully empty her bladder. She understands what bladder irritants are and how they affect the bladder. She is learning ways to manage her prolapse.  Patient will benefit from skilled therapy to improve pelvic floor strength and education on prolapse management.   OBJECTIVE IMPAIRMENTS: decreased strength, increased fascial restrictions, and increased muscle spasms.   ACTIVITY LIMITATIONS: lifting and toileting  PARTICIPATION LIMITATIONS: community activity  PERSONAL FACTORS: 3+ comorbidities:  Atrial fibrillation; chronic kidney disease; Hypothyroidism; Paroxysmal atrial fibrillation; abdominal hysterectomy; anterior and posterior repair 2015; appendectomy; back sugery x3 2 rods  are also affecting patient's functional outcome.   REHAB POTENTIAL: Excellent  CLINICAL DECISION MAKING: Evolving/moderate complexity  EVALUATION COMPLEXITY: Moderate   GOALS: Goals reviewed with patient? Yes  SHORT TERM GOALS: Target date: 12/27/23  Patient independent with initial HEP.  Baseline: Goal status: INITIAL  2.  Educated on prolapse management.  Baseline:  Goal status: INITIAL  3.  Understand how to lift without putting pressure on the pelvic floor.  Baseline:  Goal status: Met 01/24/24   LONG TERM GOALS: Target date: 02/21/24  Patient independent with advanced HEP.  Baseline:  Goal status: INITIAL  2.  Patient is able to perform daily activities with correct pressure management to reduce strain on prolapse.  Baseline:  Goal status: INITIAL  3.  Pelvic floor strength is >/= 3/5 to assist in management of the prolapse.  Baseline:  Goal status: INITIAL  4.  Patient has improved lower rib cage mobility to perform diaphragmatic breathing to reduce pressure on the weakened area of the vaginal canal.  Baseline:  Goal status: INITIAL   PLAN:  PT FREQUENCY: 1x/week  PT DURATION: 12 weeks  PLANNED INTERVENTIONS: [id number]-Therapeutic exercises, 97530- Therapeutic activity, 97112- Neuromuscular re-education, 97535- Self Care, [id number]- Manual therapy, Patient/Family education, Dry Needling, Cryotherapy, Moist heat, and Biofeedback  PLAN FOR NEXT SESSION: work on diaphragmatic breathing, hip stretches, work on pelvic floor and assess strength  Marsha Skeen, PT 01/24/24 12:31 PM

## 2024-01-27 DIAGNOSIS — J019 Acute sinusitis, unspecified: Secondary | ICD-10-CM | POA: Diagnosis not present

## 2024-01-27 DIAGNOSIS — H6521 Chronic serous otitis media, right ear: Secondary | ICD-10-CM | POA: Diagnosis not present

## 2024-02-02 ENCOUNTER — Ambulatory Visit: Admitting: Physical Therapy

## 2024-02-05 DIAGNOSIS — I480 Paroxysmal atrial fibrillation: Secondary | ICD-10-CM | POA: Diagnosis not present

## 2024-02-05 DIAGNOSIS — E7800 Pure hypercholesterolemia, unspecified: Secondary | ICD-10-CM | POA: Diagnosis not present

## 2024-02-05 DIAGNOSIS — N1831 Chronic kidney disease, stage 3a: Secondary | ICD-10-CM | POA: Diagnosis not present

## 2024-02-05 DIAGNOSIS — F320 Major depressive disorder, single episode, mild: Secondary | ICD-10-CM | POA: Diagnosis not present

## 2024-02-05 DIAGNOSIS — I700 Atherosclerosis of aorta: Secondary | ICD-10-CM | POA: Diagnosis not present

## 2024-02-05 DIAGNOSIS — E039 Hypothyroidism, unspecified: Secondary | ICD-10-CM | POA: Diagnosis not present

## 2024-02-05 DIAGNOSIS — I129 Hypertensive chronic kidney disease with stage 1 through stage 4 chronic kidney disease, or unspecified chronic kidney disease: Secondary | ICD-10-CM | POA: Diagnosis not present

## 2024-02-07 ENCOUNTER — Ambulatory Visit: Admitting: Physical Therapy

## 2024-02-07 DIAGNOSIS — R058 Other specified cough: Secondary | ICD-10-CM | POA: Diagnosis not present

## 2024-02-07 DIAGNOSIS — I129 Hypertensive chronic kidney disease with stage 1 through stage 4 chronic kidney disease, or unspecified chronic kidney disease: Secondary | ICD-10-CM | POA: Diagnosis not present

## 2024-02-12 DIAGNOSIS — I480 Paroxysmal atrial fibrillation: Secondary | ICD-10-CM | POA: Diagnosis not present

## 2024-02-12 DIAGNOSIS — I700 Atherosclerosis of aorta: Secondary | ICD-10-CM | POA: Diagnosis not present

## 2024-02-12 DIAGNOSIS — N1831 Chronic kidney disease, stage 3a: Secondary | ICD-10-CM | POA: Diagnosis not present

## 2024-02-12 DIAGNOSIS — I129 Hypertensive chronic kidney disease with stage 1 through stage 4 chronic kidney disease, or unspecified chronic kidney disease: Secondary | ICD-10-CM | POA: Diagnosis not present

## 2024-02-14 ENCOUNTER — Encounter: Payer: Self-pay | Admitting: Physical Therapy

## 2024-02-14 ENCOUNTER — Ambulatory Visit: Attending: Obstetrics and Gynecology | Admitting: Physical Therapy

## 2024-02-14 DIAGNOSIS — R252 Cramp and spasm: Secondary | ICD-10-CM | POA: Diagnosis not present

## 2024-02-14 DIAGNOSIS — M6281 Muscle weakness (generalized): Secondary | ICD-10-CM | POA: Diagnosis not present

## 2024-02-14 DIAGNOSIS — M6289 Other specified disorders of muscle: Secondary | ICD-10-CM | POA: Diagnosis not present

## 2024-02-14 NOTE — Therapy (Signed)
 OUTPATIENT PHYSICAL THERAPY FEMALE PELVIC TREATMENT   Patient Name: Frances [name]  BEATRIC FULOP MRN: [id number] DOB:1933/10/18, 89 y.o., female Today's Date: 02/14/2024  END OF SESSION:  PT End of Session - 02/14/24 1401     Visit Number 4    Date for PT Re-Evaluation 02/21/24    Authorization Type Medicare/AARP    Authorization - Visit Number 4    Authorization - Number of Visits 10    PT Start Time 1400    PT Stop Time 1440    PT Time Calculation (min) 40 min    Activity Tolerance Patient tolerated treatment well    Behavior During Therapy [facility] for tasks assessed/performed             Past Medical History:  Diagnosis Date   Arthritis    hands, back   Asthmatic bronchitis    Atrial fibrillation (HCC) 07/31/2013   Chronic anticoagulation 07/30/2014   Chronic kidney disease    stage 3 renal disease - no med   Dizziness 07/30/2014   Dysrhythmia    Hx - a-fib 05/2010 - tx with meds, no problem since 05/2010   First degree AV block 07/30/2014   Hearing loss    bilateral hearing aids   Hypertension    controlled with meds   Hypothyroidism    Internal hemorrhoid    PAF (paroxysmal atrial fibrillation) (HCC) 11/28/2015   Paroxysmal atrial fibrillation (HCC)    Peripheral vascular disease (HCC)    right arm and right shoulder blood clots r/t a fall   Persistent atrial fibrillation (HCC) 09/28/2017   Rectal bleeding    SVD (spontaneous vaginal delivery) 1957, 1959   x 2   Thyroid  disease    hypothyroidism   Past Surgical History:  Procedure Laterality Date   ABDOMINAL HYSTERECTOMY     and right ovary   ANTERIOR AND POSTERIOR REPAIR N/A 01/03/2014   Procedure: ANTERIOR (CYSTOCELE) REPAIR;  Surgeon: Loetta Ringer A. Johnston Nao, MD;  Location: [facility];  Service: Gynecology;  Laterality: N/A;   APPENDECTOMY     ATRIAL FIBRILLATION ABLATION N/A 09/28/2017   Procedure: ATRIAL FIBRILLATION ABLATION;  Surgeon: Jolly Needle, MD;  Location: [facility] INV[facility];  Service: Cardiovascular;   Laterality: N/A;   BACK SURGERY     x 3 - 2 rods and 8 screws   CARDIOVERSION N/A 08/24/2017   Procedure: CARDIOVERSION;  Surgeon: Hugh Madura, MD;  Location: [facility] ENDOSCOPY;  Service: Cardiovascular;  Laterality: N/A;   CARDIOVERSION N/A 10/19/2017   Procedure: CARDIOVERSION;  Surgeon: Loyde Rule, MD;  Location: [facility] ENDOSCOPY;  Service: Cardiovascular;  Laterality: N/A;   CARDIOVERSION N/A 10/04/2019   Procedure: CARDIOVERSION;  Surgeon: Hazle Lites, MD;  Location: [facility] ENDOSCOPY;  Service: Cardiovascular;  Laterality: N/A;   CARDIOVERSION N/A 08/20/2021   Procedure: CARDIOVERSION;  Surgeon: Hugh Madura, MD;  Location: [facility] ENDOSCOPY;  Service: Cardiovascular;  Laterality: N/A;   CARDIOVERSION N/A 07/02/2022   Procedure: CARDIOVERSION;  Surgeon: Harrold Lincoln, MD;  Location: [facility] ENDOSCOPY;  Service: Cardiovascular;  Laterality: N/A;   CARDIOVERSION N/A 10/01/2022   Procedure: CARDIOVERSION;  Surgeon: Hugh Madura, MD;  Location: [facility] ENDOSCOPY;  Service: Cardiovascular;  Laterality: N/A;   COLONOSCOPY     CYSTOCELE REPAIR     DENTAL SURGERY     infected tooth - general   DILATION AND CURETTAGE OF UTERUS     x several   ELECTROPHYSIOLOGIC STUDY N/A 11/28/2015   Procedure: Atrial Fibrillation Ablation;  Surgeon: Jolly Needle, MD;  Location: [facility] CV LAB;  Service: Cardiovascular;  Laterality: N/A;   EP IMPLANTABLE DEVICE N/A 06/08/2016   Procedure: Loop Recorder Insertion;  Surgeon: Jolly Needle, MD;  Location: [facility] INV[facility] CV LAB;  Service: Cardiovascular;  Laterality: N/A;   EXAMINATION UNDER ANESTHESIA N/A 01/15/2014   Procedure: EXAM UNDER ANESTHESIA with evacuation of hematoma and over sewing of vaginal mucosa.;  Surgeon: Marshel Skeeters. Johnston Nao, MD;  Location: [facility] ORS;  Service: Gynecology;  Laterality: N/A;   HAND SURGERY     left   hemorrhoid injection  04/2011   implantable loop recorder removal  08/06/2021   MDT Reveal LINQ removed by Dr Nunzio Belch   JOINT  REPLACEMENT     left thumb replacement   MYRINGOTOMY WITH TUBE PLACEMENT Right 10/27/2018   TEE WITHOUT CARDIOVERSION N/A 09/27/2017   Procedure: TRANSESOPHAGEAL ECHOCARDIOGRAM (TEE);  Surgeon: Jacqueline Matsu, MD;  Location: [facility] ENDOSCOPY;  Service: Cardiovascular;  Laterality: N/A;   TONSILLECTOMY     Patient Active Problem List   Diagnosis Date Noted   Atrial fibrillation and flutter (HCC) 08/19/2022   Hypercoagulable state due to atrial fibrillation (HCC) 10/03/2021   Coronary artery disease involving native coronary artery of native heart without angina pectoris 09/12/2020   Atrial flutter (HCC)    Persistent atrial fibrillation (HCC) 09/28/2017   PAF (paroxysmal atrial fibrillation) (HCC) 11/28/2015   First degree AV block 07/30/2014   Chronic anticoagulation 07/30/2014   Dizziness 07/30/2014   Post-op bleeding 01/15/2014   Cystocele 01/03/2014   Atrial fibrillation (HCC) 07/31/2013   Hypertension    Thyroid  disease    Chronic kidney disease    Internal hemorrhoid    Rectal bleeding    Asthmatic bronchitis     PCP: Tena Feeling, MD  REFERRING PROVIDER: Zuleta, Kaitlin G, NP   REFERRING DIAG:  N81.10 (ICD-10-CM) - Prolapse of anterior vaginal wall  M62.89 (ICD-10-CM) - Pelvic floor dysfunction    THERAPY DIAG:  Muscle weakness (generalized)  Pelvic floor dysfunction  Cramp and spasm  Rationale for Evaluation and Treatment: Rehabilitation  ONSET DATE: 2023  SUBJECTIVE:                                                                                                                                                                                           SUBJECTIVE STATEMENT: I have had 3 antibiotics for my infection. I can wait 3 hours to urinate.  Fluid intake: Drinks: Hot Chocolate/Milk/OJ in AM, 32-40oz Water, 8oz Coke Zero, Tumbler of Green Tea at supper per day   PAIN:  Are you having pain? No  PRECAUTIONS: None  RED FLAGS: None  WEIGHT BEARING  RESTRICTIONS: No  FALLS:  Has patient fallen in last 6 months? No  OCCUPATION: retired  ACTIVITY LEVEL : sedentary  PLOF: Independent  PATIENT GOALS: avoid surgery  PERTINENT HISTORY:  Atrial fibrillation; chronic kidney disease; Hypothyroidism; Paroxysmal atrial fibrillation; abdominal hysterectomy; anterior and posterior repair 2015; appendectomy; back sugery x3 2 rods  Sexual abuse: No  BOWEL MOVEMENT: Pain with bowel movement: No Type of bowel movement:Strain none Fully empty rectum: No, sometimes goes to urinate and small amount of stool will come out Leakage: Occurs: with gas  Pads: No Fiber supplement/laxative Yes , Benefiber  URINATION: Pain with urination: No Fully empty bladder: Noneed to urinate multiple times in a row  Stream: Weak Urgency: No Frequency: Day time voids 6-9.  Nocturia: 3/4 times per night to void.  02/14/24: I urinate every 3 hours during the day; night 1-2 times Leakage: none Pads: No  INTERCOURSE: not active   PREGNANCY: Vaginal deliveries 2  PROLAPSE: Pressure   OBJECTIVE:  Note: Objective measures were completed at Evaluation unless otherwise noted.  DIAGNOSTIC FINDINGS:  Pelvic floor strength I/V   Pelvic floor musculature: Right levator tender, Right obturator non-tender, Left levator tender, Left obturator non-tender noted dyssynergia when asking the patient to bear down.  Post Void Residual 19 mL  Patient has stage I-II/IV anterior vaginal, I/IV apical, and 0/IV posterior prolapse.   COGNITION: Overall cognitive status: Within functional limits for tasks assessed     SENSATION: Light touch: Appears intact    POSTURE: flexed trunk    LUMBARAROM/PROM:has 8 screws and 2 rods in her back  A/PROM A/PROM  eval  Flexion full  Extension Decreased by 50%  Right lateral flexion Decreased by 50%  Left lateral flexion Decreased by 50%  Right rotation Decreased by 50%  Left rotation Decreased by 50%   (Blank rows = not  tested)  LOWER EXTREMITY JSE:[id number] hip ROM is full   LOWER EXTREMITY MMT:  MMT Right eval Left eval  Hip extension 4/5 4/5   (Blank rows = not tested) PALPATION:   Pelvic Alignment: ASIS are equal  Abdominal: tenderness located in the lower abdomen, decreased movement of the lower rib cage, difficulty with contracting her lower abdomen                External Perineal Exam: tightness in the perineal body                             Internal Pelvic Floor: tenderness located in the left levator ani  Patient confirms identification and approves PT to assess internal pelvic floor and treatment Yes  PELVIC MMT:   MMT eval  Vaginal 1/5  (Blank rows = not tested)        TONE: average  PROLAPSE: Anterior wall weakness that comes to the introitus  TODAY'S TREATMENT:   02/14/24 Manual: Soft tissue mobilization: Circular massage of the abdomen to improve tissue mobility Manual work to the diaphragm to elongate for breath Myofascial release: Fascial release of the lower abdomen to reduce scar tissue and tightness Neuromuscular re-education: Down training: Diaphragmatic breathing in sitting 10 x  Exercises: Strengthening: Sitting pelvic floor contraction holding 10 sec 10 x  Sitting pelvic floor contraction quick contractions 10 x  Therapeutic activities: Functional strengthening activities: Reviewed with patient on prolapse management  01/24/24 Manual: Soft tissue mobilization: Circular massage of the abdomen to improve tissue mobility Manual work to the diaphragm to elongate for  breath Myofascial release: Fascial release of the lower abdomen to reduce scar tissue and tightness Therapeutic activities: Functional strengthening activities: Educated patient on lifting her sone with breathing out, keeping the distance of between the rib cage and pubic bone, breath out and use legs.  Educated patient to lay down for 1 hour daily to reduce pressure on the prolapse and  pelvic floor Self-care: Discussed with patient on bladder irritants and how it can affect the bladder Educated patient on going back to sleep when she wakes up instead of going to the bathroom without the urge.       12/06/23 Manual: Soft tissue mobilization: Circular massage of the abdomen to improve tissue mobility Manual work to the diaphragm to elongate for breath Scar tissue mobilization: Scar release of the lower abdomen to improve tissue mobility Myofascial release: Fascial release of the lower abdomen to reduce scar tissue and tightness Neuromuscular re-education: Down training: Diaphragmatic breathing supine and sitting to feel the pelvic floor drop Therapeutic activities: Functional strengthening activities: Laying on back with legs elevated during the day and night to reduce pressure of the prolapse Educated patient on prolapse management   PATIENT EDUCATION:  02/14/24 Education details: Access Code: [email or web address]; prolapse management Person educated: Patient Education method: Explanation, Demonstration, Tactile cues, Verbal cues, and Handouts Education comprehension: verbalized understanding, returned demonstration, verbal cues required, tactile cues required, and needs further education  HOME EXERCISE PROGRAM: 02/14/24 Access Code: [email or web address] URL: [email or web address] Date: 02/14/2024 Prepared by: Marsha Skeen  Exercises - Hooklying Isometric Hip Flexion  - 1 x daily - 7 x weekly - 1 sets - 10 reps - 2 sec hold - Seated Diaphragmatic Breathing  - 3 x daily - 7 x weekly - 1 sets - 10 reps - Seated Pelvic Floor Contraction  - 3 x daily - 7 x weekly - 1 sets - 10 reps - 10 sec hold - Seated Quick Flick Pelvic Floor Contractions  - 3 x daily - 7 x weekly - 1 sets - 10 reps  ASSESSMENT:  CLINICAL IMPRESSION: Patient is a 89 y.o. female who was seen today for physical therapy  treatment for anterior wall weakness.  Patient urinates every 3 hours during the day  and night 1-2 times.    OBJECTIVE IMPAIRMENTS: decreased strength, increased fascial restrictions, and increased muscle spasms.   ACTIVITY LIMITATIONS: lifting and toileting  PARTICIPATION LIMITATIONS: community activity  PERSONAL FACTORS: 3+ comorbidities: Atrial fibrillation; chronic kidney disease; Hypothyroidism; Paroxysmal atrial fibrillation; abdominal hysterectomy; anterior and posterior repair 2015; appendectomy; back sugery x3 2 rods  are also affecting patient's functional outcome.   REHAB POTENTIAL: Excellent  CLINICAL DECISION MAKING: Evolving/moderate complexity  EVALUATION COMPLEXITY: Moderate   GOALS: Goals reviewed with patient? Yes  SHORT TERM GOALS: Target date: 12/27/23  Patient independent with initial HEP.  Baseline: Goal status: Met 02/14/24  2.  Educated on prolapse management.  Baseline:  Goal status: Met 02/14/24  3.  Understand how to lift without putting pressure on the pelvic floor.  Baseline:  Goal status: Met 01/24/24   LONG TERM GOALS: Target date: 02/21/24  Patient independent with advanced HEP.  Baseline:  Goal status: Met 02/14/24  2.  Patient is able to perform daily activities with correct pressure management to reduce strain on prolapse.  Baseline:  Goal status: Met 02/14/24  3.  Pelvic floor strength is >/= 3/5 to assist in management of the prolapse.  Baseline:  Goal status: INITIAL  4.  Patient has improved lower rib cage mobility  to perform diaphragmatic breathing to reduce pressure on the weakened area of the vaginal canal.  Baseline:  Goal status: Met 02/14/24   PLAN: Discharge to HEP the visit    Marsha Skeen, PT 02/14/24 2:41 PM   PHYSICAL THERAPY DISCHARGE SUMMARY  Visits from Start of Care: 4  Current functional level related to goals / functional outcomes: See above.    Remaining deficits: See above.    Education / Equipment: HEP   Patient agrees to discharge. Patient goals were met. Patient is being  discharged due to meeting the stated rehab goals. Thank you for the referral.   Marsha Skeen, PT 02/14/24 2:41 PM

## 2024-02-20 ENCOUNTER — Other Ambulatory Visit (HOSPITAL_COMMUNITY): Payer: Self-pay | Admitting: Physician Assistant

## 2024-02-21 ENCOUNTER — Ambulatory Visit: Admitting: Physical Therapy

## 2024-02-24 ENCOUNTER — Ambulatory Visit: Attending: Cardiology | Admitting: Cardiology

## 2024-02-24 ENCOUNTER — Encounter: Payer: Self-pay | Admitting: Cardiology

## 2024-02-24 VITALS — BP 134/72 | HR 77 | Ht 64.75 in | Wt 175.8 lb

## 2024-02-24 DIAGNOSIS — D6869 Other thrombophilia: Secondary | ICD-10-CM | POA: Diagnosis not present

## 2024-02-24 DIAGNOSIS — I4891 Unspecified atrial fibrillation: Secondary | ICD-10-CM

## 2024-02-24 DIAGNOSIS — I4819 Other persistent atrial fibrillation: Secondary | ICD-10-CM | POA: Insufficient documentation

## 2024-02-24 DIAGNOSIS — I4892 Unspecified atrial flutter: Secondary | ICD-10-CM

## 2024-02-24 NOTE — Progress Notes (Signed)
 Cardiology Office Note:  .   Date:  02/24/2024  ID:  Frances [name], DOB 02/05/1934, MRN [id number] PCP: Tena Feeling, MD  [facility] Providers Cardiologist:  Dorothye Gathers, MD Electrophysiologist:  Efraim Grange, MD     History of Present Illness: .   Frances [name] is a 89 y.o. female Discussed the use of AI scribe software for clinical note transcription with the patient, who gave verbal consent to proceed.  History of Present Illness Frances [name] is an 89 year old female with atrial fibrillation and flutter who presents for follow-up.  She has a history of atrial fibrillation and flutter, having undergone two ablations in 2017 and 2019. She has been treated with various medications, including dofetilide , which was discontinued due to QT prolongation, and was subsequently loaded on amiodarone . Currently, she is on a low dose of amiodarone , 100 mg daily, and maintains sinus rhythm. She has undergone cardioversion in 2018, 2019, 2021, 2022, 2023, and 2024. An echocardiogram in 2024 showed an ejection fraction of 70%. No symptoms of atrial fibrillation but experiences chronic fatigue, which she attributes to her medications.  She is also on diltiazem , which was decreased at her last visit to address fatigue, but there has been no significant change. She is on Eliquis  5 mg twice daily and missed two doses recently due to a family emergency but has resumed her regular schedule.  Her thyroid  function is monitored due to amiodarone  use, and she is on levothyroxine  for hypothyroidism, which she has been taking for years. Her TSH levels are within the normal range. She also takes rosuvastatin  10 mg daily for cholesterol and ramipril  10 mg twice daily for blood pressure management. She was previously on hydrochlorothiazide , which was discontinued due to stable blood pressure.  She reports a recent upper respiratory infection that led to a persistent cough and temporary voice  loss, but she is recovering. She tested negative for COVID-19, flu, and RSV, and a chest x-ray was performed. She has a history of ear infections and currently has a tube in her ear.  Her son, who has Parkinson's disease, recently had a medical emergency involving a pancreatic cyst, which was found to be benign after a complicated biopsy procedure. This situation caused her to miss doses of her medication.      Studies Reviewed: .        Results LABS Creatinine: 1.46  RADIOLOGY Chest x-ray: normal (01/11/2024) CT scan: no active bleeding  DIAGNOSTIC Echocardiogram: EF 70%, grade 2 diastolic dysfunction (2024)  Risk Assessment/Calculations:            Physical Exam:   VS:  BP 134/72 (BP Location: Left Arm, Patient Position: Sitting, Cuff Size: Normal)   Pulse 77   Ht 5' 4.75 (1.645 m)   Wt 175 lb 12.8 oz (79.7 kg)   LMP  (LMP Unknown)   SpO2 96%   BMI 29.48 kg/m    Wt Readings from Last 3 Encounters:  02/24/24 175 lb 12.8 oz (79.7 kg)  11/25/23 176 lb 9.6 oz (80.1 kg)  08/25/23 181 lb 6.4 oz (82.3 kg)    GEN: Well nourished, well developed in no acute distress NECK: No JVD; No carotid bruits CARDIAC: RRR, no murmurs, no rubs, no gallops RESPIRATORY:  Clear to auscultation without rales, wheezing or rhonchi  ABDOMEN: Soft, non-tender, non-distended EXTREMITIES:  No edema; No deformity   ASSESSMENT AND PLAN: .    Assessment and Plan Assessment & Plan Persistent atrial  fibrillation Persistent atrial fibrillation managed with amiodarone  100 mg daily, maintaining sinus rhythm. Multiple cardioversions and two ablations in the past. No current symptoms of atrial fibrillation. Fatigue possibly related to medication side effects. CHADS-VASc score of 4, indicating moderate stroke risk. Continues on Eliquis  for anticoagulation. Recent reduction in diltiazem  dosage to address fatigue, with no significant change. - Continue amiodarone  100 mg daily. - Continue Eliquis  5 mg  twice daily. - Follow up in 6 months with APPs in the AFib clinic and in 1 year with me  Diastolic dysfunction Grade 2 diastolic dysfunction on echocardiogram in 2024 with an ejection fraction of 70%. - Continue current management.  Hypothyroidism Hypothyroidism managed with levothyroxine . Thyroid  function tests are within normal range. She questions the necessity of levothyroxine , as it was initiated years ago without extensive testing she believes. Discussion with internist recommended to reassess the need for levothyroxine . - Discuss with internist, Dr. Candi Chafe, the necessity of continuing levothyroxine .          Signed, Dorothye Gathers, MD

## 2024-02-24 NOTE — Patient Instructions (Addendum)
 Medication Instructions:  The current medical regimen is effective;  continue present plan and medications.  *If you need a refill on your cardiac medications before your next appointment, please call your pharmacy*  Follow-Up: At [facility], you and your health needs are our priority.  As part of our continuing mission to provide you with exceptional heart care, our providers are all part of one team.  This team includes your primary Cardiologist (physician) and Advanced Practice Providers or APPs (Physician Assistants and Nurse Practitioners) who all work together to provide you with the care you need, when you need it.  Your next appointment:   6 months  Provider:   You will follow up in the Atrial Fibrillation Clinic on the 4th floor. Your provider will be: Clint R. Fenton, PA-C    Follow up with Dr Renna Cary in 1 year.  We recommend signing up for the patient portal called MyChart.  Sign up information is provided on this After Visit Summary.  MyChart is used to connect with patients for Virtual Visits (Telemedicine).  Patients are able to view lab/test results, encounter notes, upcoming appointments, etc.  Non-urgent messages can be sent to your provider as well.   To learn more about what you can do with MyChart, go to [email or web address].

## 2024-02-25 ENCOUNTER — Ambulatory Visit (INDEPENDENT_AMBULATORY_CARE_PROVIDER_SITE_OTHER): Admitting: Obstetrics and Gynecology

## 2024-02-25 VITALS — BP 154/74 | HR 64

## 2024-02-25 DIAGNOSIS — R399 Unspecified symptoms and signs involving the genitourinary system: Secondary | ICD-10-CM | POA: Diagnosis not present

## 2024-02-25 DIAGNOSIS — M6289 Other specified disorders of muscle: Secondary | ICD-10-CM

## 2024-02-25 DIAGNOSIS — R350 Frequency of micturition: Secondary | ICD-10-CM | POA: Diagnosis not present

## 2024-02-25 NOTE — Patient Instructions (Addendum)
 Continue on estrogen cream x2 week  Please call with any UTI symptoms

## 2024-02-25 NOTE — Progress Notes (Unsigned)
 [facility] Return Visit  SUBJECTIVE  History of Present Illness: Frances [name]  Frances [name] is a 89 y.o. female seen in follow-up for OAB, FI, Vaginal atrophy, and recurrent UTI. Plan at last visit was start Pelvic floor PT and start vaginal estrogen cream. She has had 3 sessions with Marsha Skeen     Past Medical History: Patient  has a past medical history of Arthritis, Asthmatic bronchitis, Atrial fibrillation (HCC) (07/31/2013), Chronic anticoagulation (07/30/2014), Chronic kidney disease, Dizziness (07/30/2014), Dysrhythmia, First degree AV block (07/30/2014), Hearing loss, Hypertension, Hypothyroidism, Internal hemorrhoid, PAF (paroxysmal atrial fibrillation) (HCC) (11/28/2015), Paroxysmal atrial fibrillation (HCC), Peripheral vascular disease (HCC), Persistent atrial fibrillation (HCC) (09/28/2017), Rectal bleeding, SVD (spontaneous vaginal delivery) (1957, 1959), and Thyroid  disease.   Past Surgical History: She  has a past surgical history that includes Tonsillectomy; Appendectomy; Cystocele repair; Joint replacement; Colonoscopy; Hand surgery; hemorrhoid injection (04/2011); Dental surgery; Dilation and curettage of uterus; Abdominal hysterectomy; Back surgery; Anterior and posterior repair (N/A, 01/03/2014); Examination under anesthesia (N/A, 01/15/2014); Cardiac catheterization (N/A, 11/28/2015); Cardiac catheterization (N/A, 06/08/2016); Cardioversion (N/A, 08/24/2017); TEE without cardioversion (N/A, 09/27/2017); ATRIAL FIBRILLATION ABLATION (N/A, 09/28/2017); Cardioversion (N/A, 10/19/2017); Myringotomy with tube placement (Right, 10/27/2018); Cardioversion (N/A, 10/04/2019); implantable loop recorder removal (08/06/2021); Cardioversion (N/A, 08/20/2021); Cardioversion (N/A, 07/02/2022); and Cardioversion (N/A, 10/01/2022).   Medications: She has a current medication list which includes the following prescription(s): acetaminophen , amiodarone , biotin, calcium  carb-cholecalciferol,  diclofenac  sodium, diltiazem , diltiazem , diltiazem , eliquis , estradiol , famotidine -calcium  carbonate-magnesium  hydroxide, fluticasone, levothyroxine , paroxetine , probiotic product, ramipril , rosuvastatin , cyanocobalamin, and benefiber.   Allergies: Patient is allergic to pneumovax 23 [pneumococcal vac polyvalent], codeine, cephalexin, ciprofloxacin, sudafed [pseudoephedrine hcl], and sulfa antibiotics.   Social History: Patient  reports that she has never smoked. She has been exposed to tobacco smoke. She has never used smokeless tobacco. She reports that she does not drink alcohol and does not use drugs.     OBJECTIVE    Culture Results from [facility] Scanned into the Chart:  11/17/23: Mixed Flora 10/28/23: Citrobacter Amalonaticus >100,000 colony forming units 06/29/23: +Citrobacter amalonaticus 25-50,000 colony forming units 01/06/23: Group Frances strep 25,000-50,000 units 10/23/22: Mixed Flora 06/21/22: No Growth 04/29/22: +E.coli >100,000 colony forming units 12/14/21: Mixed Flora 10/24/20: E.coli 25-50,000 colony forming units 07/21/2020: Mixed Flora 07/28/19: +Proteus Mirabilis >100,000 Colony forming units  Physical Exam: There were no vitals filed for this visit. Gen: No apparent distress, A&O x 3.  Detailed Urogynecologic Evaluation:  Deferred. Prior exam showed:      No data to display             ASSESSMENT AND PLAN    Frances [name] is a 89 y.o. with:  No diagnosis found.  There are no diagnoses linked to this encounter.   Frances Leidy G Zebulan Hinshaw, NP

## 2024-02-28 ENCOUNTER — Encounter: Payer: Self-pay | Admitting: Obstetrics and Gynecology

## 2024-02-28 DIAGNOSIS — R059 Cough, unspecified: Secondary | ICD-10-CM | POA: Diagnosis not present

## 2024-02-28 DIAGNOSIS — R232 Flushing: Secondary | ICD-10-CM | POA: Diagnosis not present

## 2024-02-28 DIAGNOSIS — I129 Hypertensive chronic kidney disease with stage 1 through stage 4 chronic kidney disease, or unspecified chronic kidney disease: Secondary | ICD-10-CM | POA: Diagnosis not present

## 2024-02-29 DIAGNOSIS — H9193 Unspecified hearing loss, bilateral: Secondary | ICD-10-CM | POA: Diagnosis not present

## 2024-02-29 DIAGNOSIS — Z9622 Myringotomy tube(s) status: Secondary | ICD-10-CM | POA: Diagnosis not present

## 2024-02-29 DIAGNOSIS — H6991 Unspecified Eustachian tube disorder, right ear: Secondary | ICD-10-CM | POA: Diagnosis not present

## 2024-03-06 DIAGNOSIS — F320 Major depressive disorder, single episode, mild: Secondary | ICD-10-CM | POA: Diagnosis not present

## 2024-03-06 DIAGNOSIS — I700 Atherosclerosis of aorta: Secondary | ICD-10-CM | POA: Diagnosis not present

## 2024-03-06 DIAGNOSIS — N1831 Chronic kidney disease, stage 3a: Secondary | ICD-10-CM | POA: Diagnosis not present

## 2024-03-06 DIAGNOSIS — E039 Hypothyroidism, unspecified: Secondary | ICD-10-CM | POA: Diagnosis not present

## 2024-03-06 DIAGNOSIS — E7800 Pure hypercholesterolemia, unspecified: Secondary | ICD-10-CM | POA: Diagnosis not present

## 2024-03-06 DIAGNOSIS — I480 Paroxysmal atrial fibrillation: Secondary | ICD-10-CM | POA: Diagnosis not present

## 2024-03-06 DIAGNOSIS — I129 Hypertensive chronic kidney disease with stage 1 through stage 4 chronic kidney disease, or unspecified chronic kidney disease: Secondary | ICD-10-CM | POA: Diagnosis not present

## 2024-03-16 ENCOUNTER — Telehealth: Payer: Self-pay | Admitting: Cardiology

## 2024-03-16 DIAGNOSIS — I4819 Other persistent atrial fibrillation: Secondary | ICD-10-CM

## 2024-03-16 MED ORDER — APIXABAN 5 MG PO TABS: 5.0000 mg | ORAL_TABLET | Freq: Two times a day (BID) | ORAL | 1 refills | Status: DC

## 2024-03-16 NOTE — Telephone Encounter (Signed)
 Prescription refill request for Eliquis  received. Indication: Afib  Last office visit: 02/24/24 Cecile)  Scr: 1.46 (08/15/23)  Age: 89 Weight: 79.7kg  Appropriate dose. Refill sent.

## 2024-03-16 NOTE — Telephone Encounter (Signed)
*  STAT* If patient is at the pharmacy, call can be transferred to refill team.   1. Which medications need to be refilled? (please list name of each medication and dose if known) Eliquis    2. Would you like to learn more about the convenience, safety, & potential cost savings by using the [facility]?    3. Are you open to using the [facility] (Type [facility].    4. Which pharmacy/location (including street and city if local pharmacy) is medication to be sent to? [facility] [address]   5. Do they need a 30 day or 90 day supply? 90 days and refills- please call today- out of medicine

## 2024-03-23 DIAGNOSIS — I129 Hypertensive chronic kidney disease with stage 1 through stage 4 chronic kidney disease, or unspecified chronic kidney disease: Secondary | ICD-10-CM | POA: Diagnosis not present

## 2024-03-23 DIAGNOSIS — I480 Paroxysmal atrial fibrillation: Secondary | ICD-10-CM | POA: Diagnosis not present

## 2024-03-23 DIAGNOSIS — I700 Atherosclerosis of aorta: Secondary | ICD-10-CM | POA: Diagnosis not present

## 2024-03-23 DIAGNOSIS — N1831 Chronic kidney disease, stage 3a: Secondary | ICD-10-CM | POA: Diagnosis not present

## 2024-04-03 DIAGNOSIS — H903 Sensorineural hearing loss, bilateral: Secondary | ICD-10-CM | POA: Diagnosis not present

## 2024-04-06 DIAGNOSIS — E7800 Pure hypercholesterolemia, unspecified: Secondary | ICD-10-CM | POA: Diagnosis not present

## 2024-04-06 DIAGNOSIS — E039 Hypothyroidism, unspecified: Secondary | ICD-10-CM | POA: Diagnosis not present

## 2024-04-06 DIAGNOSIS — I480 Paroxysmal atrial fibrillation: Secondary | ICD-10-CM | POA: Diagnosis not present

## 2024-04-06 DIAGNOSIS — F320 Major depressive disorder, single episode, mild: Secondary | ICD-10-CM | POA: Diagnosis not present

## 2024-04-22 DIAGNOSIS — I700 Atherosclerosis of aorta: Secondary | ICD-10-CM | POA: Diagnosis not present

## 2024-04-22 DIAGNOSIS — I129 Hypertensive chronic kidney disease with stage 1 through stage 4 chronic kidney disease, or unspecified chronic kidney disease: Secondary | ICD-10-CM | POA: Diagnosis not present

## 2024-04-22 DIAGNOSIS — I480 Paroxysmal atrial fibrillation: Secondary | ICD-10-CM | POA: Diagnosis not present

## 2024-04-22 DIAGNOSIS — N1831 Chronic kidney disease, stage 3a: Secondary | ICD-10-CM | POA: Diagnosis not present

## 2024-05-07 DIAGNOSIS — I700 Atherosclerosis of aorta: Secondary | ICD-10-CM | POA: Diagnosis not present

## 2024-05-07 DIAGNOSIS — I480 Paroxysmal atrial fibrillation: Secondary | ICD-10-CM | POA: Diagnosis not present

## 2024-05-07 DIAGNOSIS — E039 Hypothyroidism, unspecified: Secondary | ICD-10-CM | POA: Diagnosis not present

## 2024-05-07 DIAGNOSIS — N1831 Chronic kidney disease, stage 3a: Secondary | ICD-10-CM | POA: Diagnosis not present

## 2024-05-07 DIAGNOSIS — I129 Hypertensive chronic kidney disease with stage 1 through stage 4 chronic kidney disease, or unspecified chronic kidney disease: Secondary | ICD-10-CM | POA: Diagnosis not present

## 2024-05-07 DIAGNOSIS — E7800 Pure hypercholesterolemia, unspecified: Secondary | ICD-10-CM | POA: Diagnosis not present

## 2024-05-07 DIAGNOSIS — F320 Major depressive disorder, single episode, mild: Secondary | ICD-10-CM | POA: Diagnosis not present

## 2024-05-15 DIAGNOSIS — M25551 Pain in right hip: Secondary | ICD-10-CM | POA: Diagnosis not present

## 2024-05-22 DIAGNOSIS — I480 Paroxysmal atrial fibrillation: Secondary | ICD-10-CM | POA: Diagnosis not present

## 2024-05-22 DIAGNOSIS — N1831 Chronic kidney disease, stage 3a: Secondary | ICD-10-CM | POA: Diagnosis not present

## 2024-05-22 DIAGNOSIS — I700 Atherosclerosis of aorta: Secondary | ICD-10-CM | POA: Diagnosis not present

## 2024-05-22 DIAGNOSIS — I129 Hypertensive chronic kidney disease with stage 1 through stage 4 chronic kidney disease, or unspecified chronic kidney disease: Secondary | ICD-10-CM | POA: Diagnosis not present

## 2024-06-06 DIAGNOSIS — I129 Hypertensive chronic kidney disease with stage 1 through stage 4 chronic kidney disease, or unspecified chronic kidney disease: Secondary | ICD-10-CM | POA: Diagnosis not present

## 2024-06-06 DIAGNOSIS — F320 Major depressive disorder, single episode, mild: Secondary | ICD-10-CM | POA: Diagnosis not present

## 2024-06-06 DIAGNOSIS — E7800 Pure hypercholesterolemia, unspecified: Secondary | ICD-10-CM | POA: Diagnosis not present

## 2024-06-06 DIAGNOSIS — I480 Paroxysmal atrial fibrillation: Secondary | ICD-10-CM | POA: Diagnosis not present

## 2024-06-06 DIAGNOSIS — N1831 Chronic kidney disease, stage 3a: Secondary | ICD-10-CM | POA: Diagnosis not present

## 2024-06-06 DIAGNOSIS — I700 Atherosclerosis of aorta: Secondary | ICD-10-CM | POA: Diagnosis not present

## 2024-06-06 DIAGNOSIS — E039 Hypothyroidism, unspecified: Secondary | ICD-10-CM | POA: Diagnosis not present

## 2024-06-21 DIAGNOSIS — I129 Hypertensive chronic kidney disease with stage 1 through stage 4 chronic kidney disease, or unspecified chronic kidney disease: Secondary | ICD-10-CM | POA: Diagnosis not present

## 2024-06-21 DIAGNOSIS — N1831 Chronic kidney disease, stage 3a: Secondary | ICD-10-CM | POA: Diagnosis not present

## 2024-06-21 DIAGNOSIS — I700 Atherosclerosis of aorta: Secondary | ICD-10-CM | POA: Diagnosis not present

## 2024-06-21 DIAGNOSIS — I480 Paroxysmal atrial fibrillation: Secondary | ICD-10-CM | POA: Diagnosis not present

## 2024-06-23 DIAGNOSIS — M1611 Unilateral primary osteoarthritis, right hip: Secondary | ICD-10-CM | POA: Diagnosis not present

## 2024-06-26 ENCOUNTER — Other Ambulatory Visit: Payer: Self-pay | Admitting: Cardiology

## 2024-06-26 DIAGNOSIS — I4819 Other persistent atrial fibrillation: Secondary | ICD-10-CM

## 2024-06-26 NOTE — Telephone Encounter (Signed)
 Prescription refill request for Eliquis  received. Indication: PAF Last office visit: 02/24/24  Frances Parchment MD Scr: 1.46 on 08/25/23  Epic Age: 89 Weight: 79.7kg  Based on above findings Eliquis  5mg  twice daily is the appropriate dose.  Refill approved.

## 2024-06-29 DIAGNOSIS — R55 Syncope and collapse: Secondary | ICD-10-CM | POA: Diagnosis not present

## 2024-06-29 DIAGNOSIS — I129 Hypertensive chronic kidney disease with stage 1 through stage 4 chronic kidney disease, or unspecified chronic kidney disease: Secondary | ICD-10-CM | POA: Diagnosis not present

## 2024-06-29 DIAGNOSIS — R300 Dysuria: Secondary | ICD-10-CM | POA: Diagnosis not present

## 2024-06-29 DIAGNOSIS — H6691 Otitis media, unspecified, right ear: Secondary | ICD-10-CM | POA: Diagnosis not present

## 2024-07-07 DIAGNOSIS — I700 Atherosclerosis of aorta: Secondary | ICD-10-CM | POA: Diagnosis not present

## 2024-07-07 DIAGNOSIS — E039 Hypothyroidism, unspecified: Secondary | ICD-10-CM | POA: Diagnosis not present

## 2024-07-07 DIAGNOSIS — E7800 Pure hypercholesterolemia, unspecified: Secondary | ICD-10-CM | POA: Diagnosis not present

## 2024-07-07 DIAGNOSIS — I480 Paroxysmal atrial fibrillation: Secondary | ICD-10-CM | POA: Diagnosis not present

## 2024-07-07 DIAGNOSIS — F320 Major depressive disorder, single episode, mild: Secondary | ICD-10-CM | POA: Diagnosis not present

## 2024-07-07 DIAGNOSIS — N1831 Chronic kidney disease, stage 3a: Secondary | ICD-10-CM | POA: Diagnosis not present

## 2024-07-07 DIAGNOSIS — I129 Hypertensive chronic kidney disease with stage 1 through stage 4 chronic kidney disease, or unspecified chronic kidney disease: Secondary | ICD-10-CM | POA: Diagnosis not present

## 2024-07-21 DIAGNOSIS — I129 Hypertensive chronic kidney disease with stage 1 through stage 4 chronic kidney disease, or unspecified chronic kidney disease: Secondary | ICD-10-CM | POA: Diagnosis not present

## 2024-07-21 DIAGNOSIS — N1831 Chronic kidney disease, stage 3a: Secondary | ICD-10-CM | POA: Diagnosis not present

## 2024-07-21 DIAGNOSIS — I700 Atherosclerosis of aorta: Secondary | ICD-10-CM | POA: Diagnosis not present

## 2024-07-21 DIAGNOSIS — I480 Paroxysmal atrial fibrillation: Secondary | ICD-10-CM | POA: Diagnosis not present

## 2024-07-24 ENCOUNTER — Other Ambulatory Visit (HOSPITAL_COMMUNITY): Payer: Self-pay | Admitting: Physician Assistant

## 2024-07-24 DIAGNOSIS — R10817 Generalized abdominal tenderness: Secondary | ICD-10-CM

## 2024-07-24 DIAGNOSIS — R300 Dysuria: Secondary | ICD-10-CM | POA: Diagnosis not present

## 2024-07-24 DIAGNOSIS — R110 Nausea: Secondary | ICD-10-CM

## 2024-07-24 DIAGNOSIS — R6881 Early satiety: Secondary | ICD-10-CM

## 2024-07-29 ENCOUNTER — Ambulatory Visit (HOSPITAL_BASED_OUTPATIENT_CLINIC_OR_DEPARTMENT_OTHER)
Admission: RE | Admit: 2024-07-29 | Discharge: 2024-07-29 | Disposition: A | Discharge status: Home / Self Care | Source: Ambulatory Visit | Attending: Physician Assistant | Admitting: Physician Assistant

## 2024-07-29 DIAGNOSIS — R110 Nausea: Secondary | ICD-10-CM | POA: Insufficient documentation

## 2024-07-29 DIAGNOSIS — R6881 Early satiety: Secondary | ICD-10-CM | POA: Insufficient documentation

## 2024-07-29 DIAGNOSIS — R10817 Generalized abdominal tenderness: Secondary | ICD-10-CM | POA: Insufficient documentation

## 2024-07-29 LAB — POCT I-STAT CREATININE: Creatinine, Ser: 1.1 mg/dL — ABNORMAL HIGH (ref 0.44–1.00)

## 2024-07-29 MED ORDER — IOHEXOL 300 MG/ML  SOLN
100.0000 mL | Freq: Once | INTRAMUSCULAR | Status: AC | PRN
  Administered 2024-07-29: 100 mL via INTRAVENOUS

## 2024-08-06 DIAGNOSIS — N1831 Chronic kidney disease, stage 3a: Secondary | ICD-10-CM | POA: Diagnosis not present

## 2024-08-06 DIAGNOSIS — E039 Hypothyroidism, unspecified: Secondary | ICD-10-CM | POA: Diagnosis not present

## 2024-08-06 DIAGNOSIS — F320 Major depressive disorder, single episode, mild: Secondary | ICD-10-CM | POA: Diagnosis not present

## 2024-08-06 DIAGNOSIS — I480 Paroxysmal atrial fibrillation: Secondary | ICD-10-CM | POA: Diagnosis not present

## 2024-08-06 DIAGNOSIS — E7800 Pure hypercholesterolemia, unspecified: Secondary | ICD-10-CM | POA: Diagnosis not present

## 2024-08-06 DIAGNOSIS — I700 Atherosclerosis of aorta: Secondary | ICD-10-CM | POA: Diagnosis not present

## 2024-08-06 DIAGNOSIS — I129 Hypertensive chronic kidney disease with stage 1 through stage 4 chronic kidney disease, or unspecified chronic kidney disease: Secondary | ICD-10-CM | POA: Diagnosis not present

## 2024-08-09 DIAGNOSIS — M1611 Unilateral primary osteoarthritis, right hip: Secondary | ICD-10-CM | POA: Diagnosis not present

## 2024-08-17 DIAGNOSIS — E7800 Pure hypercholesterolemia, unspecified: Secondary | ICD-10-CM | POA: Diagnosis not present

## 2024-08-17 DIAGNOSIS — E039 Hypothyroidism, unspecified: Secondary | ICD-10-CM | POA: Diagnosis not present

## 2024-08-24 ENCOUNTER — Ambulatory Visit (HOSPITAL_COMMUNITY)
Admission: RE | Admit: 2024-08-24 | Discharge: 2024-08-24 | Attending: Physician Assistant | Admitting: Physician Assistant

## 2024-08-24 VITALS — BP 106/66 | HR 109 | Ht 64.75 in | Wt 175.4 lb

## 2024-08-24 DIAGNOSIS — D6869 Other thrombophilia: Secondary | ICD-10-CM | POA: Diagnosis present

## 2024-08-24 DIAGNOSIS — I4891 Unspecified atrial fibrillation: Secondary | ICD-10-CM | POA: Diagnosis present

## 2024-08-24 DIAGNOSIS — Z79899 Other long term (current) drug therapy: Secondary | ICD-10-CM | POA: Diagnosis present

## 2024-08-24 DIAGNOSIS — Z5181 Encounter for therapeutic drug level monitoring: Secondary | ICD-10-CM | POA: Diagnosis present

## 2024-08-24 DIAGNOSIS — I4819 Other persistent atrial fibrillation: Secondary | ICD-10-CM | POA: Diagnosis present

## 2024-08-24 DIAGNOSIS — I484 Atypical atrial flutter: Secondary | ICD-10-CM | POA: Insufficient documentation

## 2024-08-24 NOTE — Progress Notes (Signed)
 Primary Care Physician: Dwight Trula SQUIBB, MD Primary Cardiologist: Oneil Parchment, MD Electrophysiologist: Eulas FORBES Furbish, MD  Referring Physician: Dr Kelsie   Mackensi  Frances [name] is a 89 y.o. female with a history of CKD, HTN, hypothyroidism, atrial flutter, atrial fibrillation who presents for follow up in the [facility].  The patient was initially diagnosed with atrial fibrillation remotely and is s/p ablation in 2017 and 2019. She was loaded on dofetilide  but this was discontinued due to QT prolongation. She was loaded on amiodarone . Patient is on Eliquis  for stroke prevention.  Patient returns for follow up for atrial fibrillation, atrial flutter, and amiodarone  monitoring. She reports that for the past few days she has felt more tired and dizzy. ECG today shows she is in atrial flutter. There were no specific triggers that she could identify. No bleeding issues on anticoagulation.   Today, she  denies symptoms of palpitations, chest pain, shortness of breath, orthopnea, PND, lower extremity edema, presyncope, syncope, snoring, daytime somnolence, bleeding, or neurologic sequela. The patient is tolerating medications without difficulties and is otherwise without complaint today.    Atrial Fibrillation Risk Factors:  she does not have symptoms or diagnosis of sleep apnea. she does not have a history of rheumatic fever. she does not have a history of alcohol use.   Atrial Fibrillation Management history:  Previous antiarrhythmic drugs: dofetilide  (QT prolongation), amiodarone   Previous cardioversions: 2018, 2019, 2021, 2022, 07/02/22, 10/01/22 Previous ablations: 11/28/15, 09/28/17 Anticoagulation history: Eliquis   ROS- All systems are reviewed and negative except as per the HPI above.  Past Medical History:  Diagnosis Date   Arthritis    hands, back   Asthmatic bronchitis    Atrial fibrillation (HCC) 07/31/2013   Chronic anticoagulation 07/30/2014    Chronic kidney disease    stage 3 renal disease - no med   Dizziness 07/30/2014   Dysrhythmia    Hx - a-fib 05/2010 - tx with meds, no problem since 05/2010   First degree AV block 07/30/2014   Hearing loss    bilateral hearing aids   Hypertension    controlled with meds   Hypothyroidism    Internal hemorrhoid    PAF (paroxysmal atrial fibrillation) (HCC) 11/28/2015   Paroxysmal atrial fibrillation (HCC)    Peripheral vascular disease    right arm and right shoulder blood clots r/t a fall   Persistent atrial fibrillation (HCC) 09/28/2017   Rectal bleeding    SVD (spontaneous vaginal delivery) 1957, 1959   x 2   Thyroid  disease    hypothyroidism    Current Outpatient Medications  Medication Sig Dispense Refill   acetaminophen  (TYLENOL ) 500 MG tablet Take 500-1,000 mg by mouth as needed for moderate pain or headache.     amiodarone  (PACERONE ) 200 MG tablet Take 0.5 tablets (100 mg total) by mouth daily. Take 0.5 mg daily 45 tablet 2   Biotin (BIOTIN 5000) 5 MG CAPS Take 5 mg by mouth daily with lunch.     Calcium  Carb-Cholecalciferol (CALCIUM  600 + D PO) Take 1 tablet by mouth daily with lunch. 600 mg / 20 mcg     diclofenac  Sodium (VOLTAREN ) 1 % GEL Apply 1 Application topically as needed (pain).     diltiazem  (CARDIZEM  CD) 180 MG 24 hr capsule Take 1 capsule by mouth daily.     diltiazem  (CARDIZEM ) 30 MG tablet TAKE 1 TABLET BY MOUTH EVERY 4 HOURS AS NEEDED (FOR FAST HEART RATE > 100 AS LONG AS BP >  100). 135 tablet 1   diltiazem  (TIAZAC ) 180 MG 24 hr capsule TAKE 1 CAPSULE BY MOUTH DAILY 90 capsule 3   ELIQUIS  5 MG TABS tablet TAKE 1 TABLET BY MOUTH TWICE A DAY 180 tablet 1   estradiol  (ESTRACE ) 0.1 MG/GM vaginal cream Place 0.5 g vaginally 2 (two) times a week. Place 1g nightly for two weeks then twice a week after 42.5 g 11   famotidine -calcium  carbonate-magnesium  hydroxide (PEPCID  COMPLETE) 10-800-165 MG chewable tablet Chew 1 tablet by mouth daily as needed (acid reflux).  (Patient taking differently: Chew 1 tablet by mouth as needed (acid reflux).)     fluticasone (FLONASE) 50 MCG/ACT nasal spray Place 1 spray into both nostrils as needed.     levothyroxine  (SYNTHROID , LEVOTHROID) 75 MCG tablet Take 75 mcg by mouth daily before breakfast.     PARoxetine  (PAXIL ) 20 MG tablet Take 20 mg by mouth daily. (Patient taking differently: Take 20 mg by mouth daily. Currently taking 1/2- 1 whole tablet)     Probiotic Product (PROBIOTIC PO) Take 1 capsule by mouth daily with lunch.     rosuvastatin  (CRESTOR ) 10 MG tablet TAKE 1 TABLET BY MOUTH EVERY DAY 90 tablet 3   vitamin Frances-12 (CYANOCOBALAMIN) 1000 MCG tablet Take 1,000 mcg by mouth daily with lunch.     Wheat Dextrin (BENEFIBER) POWD Take 1 Scoop by mouth daily.     valsartan (DIOVAN) 160 MG tablet Take 160 mg by mouth daily.     No current facility-administered medications for this encounter.    Physical Exam: BP 106/66   Pulse (!) 109   Ht 5' 4.75 (1.645 m)   Wt 79.6 kg   LMP  (LMP Unknown)   BMI 29.41 kg/m   GEN: Well nourished, well developed in no acute distress CARDIAC: Regular rate and rhythm, no murmurs, rubs, gallops RESPIRATORY:  Clear to auscultation without rales, wheezing or rhonchi  ABDOMEN: Soft, non-tender, non-distended EXTREMITIES:  No edema; No deformity    Wt Readings from Last 3 Encounters:  08/24/24 79.6 kg  02/24/24 79.7 kg  11/25/23 80.1 kg     EKG Interpretation Date/Time:  Thursday August 24 2024 11:15:40 EST Ventricular Rate:  109 PR Interval:    QRS Duration:  106 QT Interval:  356 QTC Calculation: 479 R Axis:   -74  Text Interpretation: Atypical atrial flutter with 2:1 block Left anterior fasicular block Minimal voltage criteria for LVH, may be normal variant ( Cornell product ) Septal infarct (cited on or before 08-Oct-2022) Abnormal ECG When compared with ECG of 25-Aug-2023 11:18, Atrial flutter has replaced Sinus rhythm Confirmed by Raychell Holcomb ([id number]) on  08/24/2024 11:20:25 AM    Echo 10/30/22 demonstrated   1. Left ventricular ejection fraction, by estimation, is 65 to 70%. The  left ventricle has normal function. The left ventricle has no regional  wall motion abnormalities. There is mild left ventricular hypertrophy.  Left ventricular diastolic parameters are consistent with Grade II diastolic dysfunction (pseudonormalization). The average left ventricular global longitudinal strain is -16.1 %. The global longitudinal strain is abnormal.   2. Right ventricular systolic function is normal. The right ventricular  size is normal. There is normal pulmonary artery systolic pressure.   3. Left atrial size was mildly dilated.   4. The mitral valve is normal in structure. Trivial mitral valve  regurgitation. No evidence of mitral stenosis.   5. The aortic valve is tricuspid. There is mild calcification of the  aortic valve. There is mild thickening  of the aortic valve. Aortic valve  regurgitation is not visualized. No aortic stenosis is present.   6. The inferior vena cava is normal in size with greater than 50%  respiratory variability, suggesting right atrial pressure of 3 mmHg.    CHA2DS2-VASc Score = 4  The patient's score is based upon: CHF History: 0 HTN History: 1 Diabetes History: 0 Stroke History: 0 Vascular Disease History: 0 Age Score: 2 Gender Score: 1       ASSESSMENT AND PLAN: Persistent Atrial Fibrillation/atrial flutter (ICD10:  I48.19) The patient's CHA2DS2-VASc score is 4, indicating a 4.8% annual risk of stroke.   S/p afib ablation 2017 and 2019 She is back in atrial flutter today. Will increase amiodarone  to 200 mg BID for two weeks. Hopefully, she will convert chemically. If not, will plan for DCCV. inc amio Continue diltiazem  180 mg daily with 30 mg PRN q 4 hours for heart racing.  Continue Eliquis  5 mg BID  Secondary Hypercoagulable State (ICD10:  D68.69) The patient is at significant risk for  stroke/thromboembolism based upon her CHA2DS2-VASc Score of 4.  Continue Apixaban  (Eliquis ). No bleeding issues.   High Risk Medication Monitoring (ICD 10: J342684) Patient requires ongoing monitoring for anti-arrhythmic medication which has the potential to cause life threatening arrhythmias. Intervals on ECG acceptable for amiodarone  monitoring. Recent Cmet and TSH in care everywhere reviewed.   HTN Stable on current regimen   Follow up in the AF clinic in 2 weeks for ECG.    Daril Kicks PA-C [facility] [address] [place], [place] [address] [phone]

## 2024-08-24 NOTE — H&P (View-Only) (Signed)
 Primary Care Physician: Dwight Trula SQUIBB, MD Primary Cardiologist: Oneil Parchment, MD Electrophysiologist: Eulas FORBES Furbish, MD  Referring Physician: Dr Kelsie   Mackensi  B Frances [name] is a 89 y.o. female with a history of CKD, HTN, hypothyroidism, atrial flutter, atrial fibrillation who presents for follow up in the [facility].  The patient was initially diagnosed with atrial fibrillation remotely and is s/p ablation in 2017 and 2019. She was loaded on dofetilide  but this was discontinued due to QT prolongation. She was loaded on amiodarone . Patient is on Eliquis  for stroke prevention.  Patient returns for follow up for atrial fibrillation, atrial flutter, and amiodarone  monitoring. She reports that for the past few days she has felt more tired and dizzy. ECG today shows she is in atrial flutter. There were no specific triggers that she could identify. No bleeding issues on anticoagulation.   Today, she  denies symptoms of palpitations, chest pain, shortness of breath, orthopnea, PND, lower extremity edema, presyncope, syncope, snoring, daytime somnolence, bleeding, or neurologic sequela. The patient is tolerating medications without difficulties and is otherwise without complaint today.    Atrial Fibrillation Risk Factors:  she does not have symptoms or diagnosis of sleep apnea. she does not have a history of rheumatic fever. she does not have a history of alcohol use.   Atrial Fibrillation Management history:  Previous antiarrhythmic drugs: dofetilide  (QT prolongation), amiodarone   Previous cardioversions: 2018, 2019, 2021, 2022, 07/02/22, 10/01/22 Previous ablations: 11/28/15, 09/28/17 Anticoagulation history: Eliquis   ROS- All systems are reviewed and negative except as per the HPI above.  Past Medical History:  Diagnosis Date   Arthritis    hands, back   Asthmatic bronchitis    Atrial fibrillation (HCC) 07/31/2013   Chronic anticoagulation 07/30/2014    Chronic kidney disease    stage 3 renal disease - no med   Dizziness 07/30/2014   Dysrhythmia    Hx - a-fib 05/2010 - tx with meds, no problem since 05/2010   First degree AV block 07/30/2014   Hearing loss    bilateral hearing aids   Hypertension    controlled with meds   Hypothyroidism    Internal hemorrhoid    PAF (paroxysmal atrial fibrillation) (HCC) 11/28/2015   Paroxysmal atrial fibrillation (HCC)    Peripheral vascular disease    right arm and right shoulder blood clots r/t a fall   Persistent atrial fibrillation (HCC) 09/28/2017   Rectal bleeding    SVD (spontaneous vaginal delivery) 1957, 1959   x 2   Thyroid  disease    hypothyroidism    Current Outpatient Medications  Medication Sig Dispense Refill   acetaminophen  (TYLENOL ) 500 MG tablet Take 500-1,000 mg by mouth as needed for moderate pain or headache.     amiodarone  (PACERONE ) 200 MG tablet Take 0.5 tablets (100 mg total) by mouth daily. Take 0.5 mg daily 45 tablet 2   Biotin (BIOTIN 5000) 5 MG CAPS Take 5 mg by mouth daily with lunch.     Calcium  Carb-Cholecalciferol (CALCIUM  600 + D PO) Take 1 tablet by mouth daily with lunch. 600 mg / 20 mcg     diclofenac  Sodium (VOLTAREN ) 1 % GEL Apply 1 Application topically as needed (pain).     diltiazem  (CARDIZEM  CD) 180 MG 24 hr capsule Take 1 capsule by mouth daily.     diltiazem  (CARDIZEM ) 30 MG tablet TAKE 1 TABLET BY MOUTH EVERY 4 HOURS AS NEEDED (FOR FAST HEART RATE > 100 AS LONG AS BP >  100). 135 tablet 1   diltiazem  (TIAZAC ) 180 MG 24 hr capsule TAKE 1 CAPSULE BY MOUTH DAILY 90 capsule 3   ELIQUIS  5 MG TABS tablet TAKE 1 TABLET BY MOUTH TWICE A DAY 180 tablet 1   estradiol  (ESTRACE ) 0.1 MG/GM vaginal cream Place 0.5 g vaginally 2 (two) times a week. Place 1g nightly for two weeks then twice a week after 42.5 g 11   famotidine -calcium  carbonate-magnesium  hydroxide (PEPCID  COMPLETE) 10-800-165 MG chewable tablet Chew 1 tablet by mouth daily as needed (acid reflux).  (Patient taking differently: Chew 1 tablet by mouth as needed (acid reflux).)     fluticasone (FLONASE) 50 MCG/ACT nasal spray Place 1 spray into both nostrils as needed.     levothyroxine  (SYNTHROID , LEVOTHROID) 75 MCG tablet Take 75 mcg by mouth daily before breakfast.     PARoxetine  (PAXIL ) 20 MG tablet Take 20 mg by mouth daily. (Patient taking differently: Take 20 mg by mouth daily. Currently taking 1/2- 1 whole tablet)     Probiotic Product (PROBIOTIC PO) Take 1 capsule by mouth daily with lunch.     rosuvastatin  (CRESTOR ) 10 MG tablet TAKE 1 TABLET BY MOUTH EVERY DAY 90 tablet 3   vitamin B-12 (CYANOCOBALAMIN) 1000 MCG tablet Take 1,000 mcg by mouth daily with lunch.     Wheat Dextrin (BENEFIBER) POWD Take 1 Scoop by mouth daily.     valsartan (DIOVAN) 160 MG tablet Take 160 mg by mouth daily.     No current facility-administered medications for this encounter.    Physical Exam: BP 106/66   Pulse (!) 109   Ht 5' 4.75 (1.645 m)   Wt 79.6 kg   LMP  (LMP Unknown)   BMI 29.41 kg/m   GEN: Well nourished, well developed in no acute distress CARDIAC: Regular rate and rhythm, no murmurs, rubs, gallops RESPIRATORY:  Clear to auscultation without rales, wheezing or rhonchi  ABDOMEN: Soft, non-tender, non-distended EXTREMITIES:  No edema; No deformity    Wt Readings from Last 3 Encounters:  08/24/24 79.6 kg  02/24/24 79.7 kg  11/25/23 80.1 kg     EKG Interpretation Date/Time:  Thursday August 24 2024 11:15:40 EST Ventricular Rate:  109 PR Interval:    QRS Duration:  106 QT Interval:  356 QTC Calculation: 479 R Axis:   -74  Text Interpretation: Atypical atrial flutter with 2:1 block Left anterior fasicular block Minimal voltage criteria for LVH, may be normal variant ( Cornell product ) Septal infarct (cited on or before 08-Oct-2022) Abnormal ECG When compared with ECG of 25-Aug-2023 11:18, Atrial flutter has replaced Sinus rhythm Confirmed by Raychell Holcomb ([id number]) on  08/24/2024 11:20:25 AM    Echo 10/30/22 demonstrated   1. Left ventricular ejection fraction, by estimation, is 65 to 70%. The  left ventricle has normal function. The left ventricle has no regional  wall motion abnormalities. There is mild left ventricular hypertrophy.  Left ventricular diastolic parameters are consistent with Grade II diastolic dysfunction (pseudonormalization). The average left ventricular global longitudinal strain is -16.1 %. The global longitudinal strain is abnormal.   2. Right ventricular systolic function is normal. The right ventricular  size is normal. There is normal pulmonary artery systolic pressure.   3. Left atrial size was mildly dilated.   4. The mitral valve is normal in structure. Trivial mitral valve  regurgitation. No evidence of mitral stenosis.   5. The aortic valve is tricuspid. There is mild calcification of the  aortic valve. There is mild thickening  of the aortic valve. Aortic valve  regurgitation is not visualized. No aortic stenosis is present.   6. The inferior vena cava is normal in size with greater than 50%  respiratory variability, suggesting right atrial pressure of 3 mmHg.    CHA2DS2-VASc Score = 4  The patient's score is based upon: CHF History: 0 HTN History: 1 Diabetes History: 0 Stroke History: 0 Vascular Disease History: 0 Age Score: 2 Gender Score: 1       ASSESSMENT AND PLAN: Persistent Atrial Fibrillation/atrial flutter (ICD10:  I48.19) The patient's CHA2DS2-VASc score is 4, indicating a 4.8% annual risk of stroke.   S/p afib ablation 2017 and 2019 She is back in atrial flutter today. Will increase amiodarone  to 200 mg BID for two weeks. Hopefully, she will convert chemically. If not, will plan for DCCV. inc amio Continue diltiazem  180 mg daily with 30 mg PRN q 4 hours for heart racing.  Continue Eliquis  5 mg BID  Secondary Hypercoagulable State (ICD10:  D68.69) The patient is at significant risk for  stroke/thromboembolism based upon her CHA2DS2-VASc Score of 4.  Continue Apixaban  (Eliquis ). No bleeding issues.   High Risk Medication Monitoring (ICD 10: J342684) Patient requires ongoing monitoring for anti-arrhythmic medication which has the potential to cause life threatening arrhythmias. Intervals on ECG acceptable for amiodarone  monitoring. Recent Cmet and TSH in care everywhere reviewed.   HTN Stable on current regimen   Follow up in the AF clinic in 2 weeks for ECG.    Daril Kicks PA-C [facility] [address] [place], [place] [address] [phone]

## 2024-08-24 NOTE — Patient Instructions (Signed)
 Increase amiodarone  to 200 mg (one whole tablet) in the morning and in the evening until follow up on 09/08/24.

## 2024-08-25 ENCOUNTER — Other Ambulatory Visit (HOSPITAL_COMMUNITY)
Admission: RE | Admit: 2024-08-25 | Discharge: 2024-08-25 | Disposition: A | Attending: Obstetrics and Gynecology | Admitting: Obstetrics and Gynecology

## 2024-08-25 ENCOUNTER — Ambulatory Visit (INDEPENDENT_AMBULATORY_CARE_PROVIDER_SITE_OTHER): Admitting: Obstetrics and Gynecology

## 2024-08-25 ENCOUNTER — Other Ambulatory Visit: Payer: Self-pay | Admitting: Obstetrics and Gynecology

## 2024-08-25 ENCOUNTER — Encounter: Payer: Self-pay | Admitting: Obstetrics and Gynecology

## 2024-08-25 VITALS — BP 124/79 | HR 105

## 2024-08-25 DIAGNOSIS — R300 Dysuria: Secondary | ICD-10-CM | POA: Diagnosis present

## 2024-08-25 DIAGNOSIS — N390 Urinary tract infection, site not specified: Secondary | ICD-10-CM

## 2024-08-25 DIAGNOSIS — Z87440 Personal history of urinary (tract) infections: Secondary | ICD-10-CM | POA: Diagnosis not present

## 2024-08-25 DIAGNOSIS — R399 Unspecified symptoms and signs involving the genitourinary system: Secondary | ICD-10-CM

## 2024-08-25 LAB — URINALYSIS, COMPLETE (UACMP) WITH MICROSCOPIC
Glucose, UA: >=500 mg/dL — AB
Ketones, ur: NEGATIVE mg/dL
Leukocytes,Ua: NEGATIVE
Nitrite: NEGATIVE
Protein, ur: >=300 mg/dL — AB
Specific Gravity, Urine: 1.012 (ref 1.005–1.030)
pH: 9 — ABNORMAL HIGH (ref 5.0–8.0)

## 2024-08-25 LAB — POCT URINE DIPSTICK
Bilirubin, UA: NEGATIVE
Glucose, UA: NEGATIVE mg/dL
Ketones, POC UA: NEGATIVE mg/dL
Nitrite, UA: POSITIVE — AB
POC PROTEIN,UA: 30 — AB
Spec Grav, UA: 1.015
Urobilinogen, UA: 0.2 U/dL
pH, UA: 7

## 2024-08-25 MED ORDER — METHENAMINE HIPPURATE 1 G PO TABS: 1.0000 g | ORAL_TABLET | Freq: Two times a day (BID) | ORAL | 2 refills | Status: AC

## 2024-08-25 MED ORDER — FOSFOMYCIN TROMETHAMINE 3 G PO PACK: 3.0000 g | PACK | Freq: Once | ORAL | 0 refills | Status: AC

## 2024-08-25 NOTE — Patient Instructions (Addendum)
 Take a dose of the Fosfomycin today, tomorrow start Hiprex x2 daily with your yogurt.   Please use the estrogen cream twice a week. You can use your finger instead of your applicator.   We have sent your urine for culture and will get results on Monday  Pause the miralax for now until we get the culture.

## 2024-08-25 NOTE — Progress Notes (Unsigned)
 [facility] Return Visit  SUBJECTIVE  History of Present Illness: Frances [name] is a 89 y.o. female seen in follow-up for rUTI. Plan at last visit was estrogen cream twice weekly and to call with any UTI symptoms.  Patient reports she has had x2 UTI's that were checked at Dr. Janette office and were treated. She reports she is still having dysuria during urination and that she feels frequently irritated in the tissues.She reports she has not been doing her estrogen cream as frequently as she would like.       Past Medical History: Patient  has a past medical history of Arthritis, Asthmatic bronchitis, Atrial fibrillation (HCC) (07/31/2013), Chronic anticoagulation (07/30/2014), Chronic kidney disease, Dizziness (07/30/2014), Dysrhythmia, First degree AV block (07/30/2014), Hearing loss, Hypertension, Hypothyroidism, Internal hemorrhoid, PAF (paroxysmal atrial fibrillation) (HCC) (11/28/2015), Paroxysmal atrial fibrillation (HCC), Peripheral vascular disease, Persistent atrial fibrillation (HCC) (09/28/2017), Rectal bleeding, SVD (spontaneous vaginal delivery) (1957, 1959), and Thyroid  disease.   Past Surgical History: She  has a past surgical history that includes Tonsillectomy; Appendectomy; Cystocele repair; Joint replacement; Colonoscopy; Hand surgery; hemorrhoid injection (04/2011); Dental surgery; Dilation and curettage of uterus; Abdominal hysterectomy; Back surgery; Anterior and posterior repair (N/A, 01/03/2014); Examination under anesthesia (N/A, 01/15/2014); Cardiac catheterization (N/A, 11/28/2015); Cardiac catheterization (N/A, 06/08/2016); Cardioversion (N/A, 08/24/2017); TEE without cardioversion (N/A, 09/27/2017); ATRIAL FIBRILLATION ABLATION (N/A, 09/28/2017); Cardioversion (N/A, 10/19/2017); Myringotomy with tube placement (Right, 10/27/2018); Cardioversion (N/A, 10/04/2019); implantable loop recorder removal (08/06/2021); Cardioversion (N/A, 08/20/2021);  Cardioversion (N/A, 07/02/2022); and Cardioversion (N/A, 10/01/2022).   Medications: She has a current medication list which includes the following prescription(s): acetaminophen , amiodarone , biotin, calcium  carb-cholecalciferol, diclofenac  sodium, diltiazem , eliquis , famotidine -calcium  carbonate-magnesium  hydroxide, fluticasone, levothyroxine , paroxetine , probiotic product, rosuvastatin , valsartan, cyanocobalamin, benefiber, diltiazem , and estradiol .   Allergies: Patient is allergic to pneumovax 23 [pneumococcal vac polyvalent], codeine, cephalexin, ciprofloxacin, sudafed [pseudoephedrine hcl], and sulfa antibiotics.   Social History: Patient  reports that she has never smoked. She has been exposed to tobacco smoke. She has never used smokeless tobacco. She reports that she does not drink alcohol and does not use drugs.     OBJECTIVE     Physical Exam: Vitals:   08/25/24 1122  BP: 124/79  Pulse: (!) 105   Gen: No apparent distress, A&O x 3.  Detailed Urogynecologic Evaluation:  Deferred. Prior exam showed:      No data to display             ASSESSMENT AND PLAN    Frances [name] is a 89 y.o. with:  No diagnosis found.  There are no diagnoses linked to this encounter.   Frances [name] Frances Shanta Dorvil, NP

## 2024-09-03 ENCOUNTER — Other Ambulatory Visit (HOSPITAL_COMMUNITY): Payer: Self-pay | Admitting: Physician Assistant

## 2024-09-04 ENCOUNTER — Other Ambulatory Visit (HOSPITAL_COMMUNITY): Payer: Self-pay | Admitting: *Deleted

## 2024-09-04 MED ORDER — AMIODARONE HCL 200 MG PO TABS: ORAL_TABLET | ORAL | 2 refills | Status: AC

## 2024-09-08 ENCOUNTER — Ambulatory Visit (HOSPITAL_COMMUNITY): Admitting: Physician Assistant

## 2024-09-18 ENCOUNTER — Encounter: Payer: Self-pay | Admitting: *Deleted

## 2024-09-18 ENCOUNTER — Ambulatory Visit (HOSPITAL_COMMUNITY)
Admission: RE | Admit: 2024-09-18 | Discharge: 2024-09-18 | Disposition: A | Discharge status: Home / Self Care | Source: Ambulatory Visit | Attending: Physician Assistant | Admitting: Physician Assistant

## 2024-09-18 VITALS — HR 95

## 2024-09-18 DIAGNOSIS — I4891 Unspecified atrial fibrillation: Secondary | ICD-10-CM | POA: Diagnosis present

## 2024-09-18 NOTE — Progress Notes (Signed)
 Patient returns for ECG after increasing amiodarone . ECG shows:   EKG Interpretation Date/Time:  Monday September 18 2024 14:50:41 EST Ventricular Rate:  95 PR Interval:  184 QRS Duration:  96 QT Interval:  366 QTC Calculation: 459 R Axis:   -74  Text Interpretation: Atypical atrial flutter Left axis deviation Pulmonary disease pattern Septal infarct (cited on or before 08-Oct-2022) Abnormal ECG When compared with ECG of 24-Aug-2024 11:15, No significant change was found Confirmed by Nivek Powley ([id number]) on 09/18/2024 3:03:34 PM    Will arrange for DCCV. Decrease amiodarone  back to 200 mg daily on 09/21/24. Check cmet/TSH/cbc. Follow up in the AF clinic post DCCV.    Informed Consent   Shared Decision Making/Informed Consent The risks (stroke, cardiac arrhythmias rarely resulting in the need for a temporary or permanent pacemaker, skin irritation or burns and complications associated with conscious sedation including aspiration, arrhythmia, respiratory failure and death), benefits (restoration of normal sinus rhythm) and alternatives of a direct current cardioversion were explained in detail to Frances [name] and she agrees to proceed.

## 2024-09-18 NOTE — Patient Instructions (Addendum)
 Day of cardioversion reduce amiodarone  to 200mg  once a day    Cardioversion scheduled for: Thursday January 15th   - Arrive at the [facility] ([address])  and check in with ADMITTING at 9:00 AM   - Do not eat or drink anything after midnight the night prior to your procedure.   - Take all your morning medication (except diabetic medications) with a sip of water prior to arrival.  - Do NOT miss any doses of your blood thinner - if you should miss a dose or take a dose more than 4 hours late -- please notify our office immediately.  - You will not be able to drive home after your procedure. Please ensure you have a responsible adult to drive you home. You will need someone with you for 24 hours post procedure.     - Expect to be in the procedural area approximately 2 hours.   - If you feel as if you go back into normal rhythm prior to scheduled cardioversion, please notify our office immediately.   If your procedure is canceled in the cardioversion suite you will be charged a cancellation fee.

## 2024-09-19 ENCOUNTER — Ambulatory Visit: Payer: Self-pay | Admitting: Obstetrics

## 2024-09-19 ENCOUNTER — Ambulatory Visit (HOSPITAL_COMMUNITY): Payer: Self-pay | Admitting: Physician Assistant

## 2024-09-19 LAB — CBC
Hematocrit: 43.9 % (ref 34.0–46.6)
Hemoglobin: 13.4 g/dL (ref 11.1–15.9)
MCH: 28.2 pg (ref 26.6–33.0)
MCHC: 30.5 g/dL — ABNORMAL LOW (ref 31.5–35.7)
MCV: 92 fL (ref 79–97)
Platelets: 279 x10E3/uL (ref 150–450)
RBC: 4.75 x10E6/uL (ref 3.77–5.28)
RDW: 14 % (ref 11.7–15.4)
WBC: 8.1 x10E3/uL (ref 3.4–10.8)

## 2024-09-19 LAB — COMPREHENSIVE METABOLIC PANEL WITH GFR
ALT: 16 IU/L (ref 0–32)
AST: 24 IU/L (ref 0–40)
Albumin: 4.5 g/dL (ref 3.7–4.7)
Alkaline Phosphatase: 79 IU/L (ref 48–129)
BUN/Creatinine Ratio: 14 (ref 12–28)
BUN: 16 mg/dL (ref 8–27)
Bilirubin Total: 0.2 mg/dL (ref 0.0–1.2)
CO2: 20 mmol/L (ref 20–29)
Calcium: 9.9 mg/dL (ref 8.7–10.3)
Chloride: 104 mmol/L (ref 96–106)
Creatinine, Ser: 1.13 mg/dL — ABNORMAL HIGH (ref 0.57–1.00)
Globulin, Total: 2.3 g/dL (ref 1.5–4.5)
Glucose: 100 mg/dL — ABNORMAL HIGH (ref 70–99)
Potassium: 4.2 mmol/L (ref 3.5–5.2)
Sodium: 143 mmol/L (ref 134–144)
Total Protein: 6.8 g/dL (ref 6.0–8.5)
eGFR: 47 mL/min/1.73 — ABNORMAL LOW

## 2024-09-19 LAB — TSH: TSH: 5.06 u[IU]/mL — ABNORMAL HIGH (ref 0.450–4.500)

## 2024-09-20 ENCOUNTER — Other Ambulatory Visit: Payer: Self-pay | Admitting: Cardiology

## 2024-09-20 NOTE — Telephone Encounter (Signed)
 In accordance with refill protocols, please review and address the following requirements before this medication refill can be authorized:  Labs

## 2024-09-21 ENCOUNTER — Ambulatory Visit (HOSPITAL_COMMUNITY)
Admission: RE | Admit: 2024-09-21 | Discharge: 2024-09-21 | Disposition: A | Discharge status: Home / Self Care | Attending: Cardiology | Admitting: Cardiology

## 2024-09-21 ENCOUNTER — Encounter (HOSPITAL_COMMUNITY): Payer: Self-pay | Admitting: Cardiology

## 2024-09-21 ENCOUNTER — Other Ambulatory Visit: Payer: Self-pay

## 2024-09-21 ENCOUNTER — Ambulatory Visit (HOSPITAL_COMMUNITY): Admitting: Certified Registered Nurse Anesthetist

## 2024-09-21 ENCOUNTER — Encounter (HOSPITAL_COMMUNITY): Admission: RE | Disposition: A | Payer: Self-pay | Source: Home / Self Care | Attending: Cardiology

## 2024-09-21 DIAGNOSIS — I129 Hypertensive chronic kidney disease with stage 1 through stage 4 chronic kidney disease, or unspecified chronic kidney disease: Secondary | ICD-10-CM | POA: Diagnosis not present

## 2024-09-21 DIAGNOSIS — D6869 Other thrombophilia: Secondary | ICD-10-CM | POA: Insufficient documentation

## 2024-09-21 DIAGNOSIS — Z79899 Other long term (current) drug therapy: Secondary | ICD-10-CM | POA: Insufficient documentation

## 2024-09-21 DIAGNOSIS — I484 Atypical atrial flutter: Secondary | ICD-10-CM | POA: Diagnosis not present

## 2024-09-21 DIAGNOSIS — N183 Chronic kidney disease, stage 3 unspecified: Secondary | ICD-10-CM | POA: Insufficient documentation

## 2024-09-21 DIAGNOSIS — Z7901 Long term (current) use of anticoagulants: Secondary | ICD-10-CM | POA: Insufficient documentation

## 2024-09-21 DIAGNOSIS — N189 Chronic kidney disease, unspecified: Secondary | ICD-10-CM | POA: Diagnosis not present

## 2024-09-21 DIAGNOSIS — E039 Hypothyroidism, unspecified: Secondary | ICD-10-CM | POA: Insufficient documentation

## 2024-09-21 DIAGNOSIS — I4819 Other persistent atrial fibrillation: Secondary | ICD-10-CM | POA: Insufficient documentation

## 2024-09-21 DIAGNOSIS — I4892 Unspecified atrial flutter: Secondary | ICD-10-CM | POA: Diagnosis not present

## 2024-09-21 DIAGNOSIS — I739 Peripheral vascular disease, unspecified: Secondary | ICD-10-CM | POA: Insufficient documentation

## 2024-09-21 DIAGNOSIS — K219 Gastro-esophageal reflux disease without esophagitis: Secondary | ICD-10-CM | POA: Diagnosis not present

## 2024-09-21 DIAGNOSIS — I2510 Atherosclerotic heart disease of native coronary artery without angina pectoris: Secondary | ICD-10-CM | POA: Insufficient documentation

## 2024-09-21 DIAGNOSIS — Z79890 Hormone replacement therapy (postmenopausal): Secondary | ICD-10-CM | POA: Insufficient documentation

## 2024-09-21 DIAGNOSIS — I4891 Unspecified atrial fibrillation: Secondary | ICD-10-CM

## 2024-09-21 HISTORY — PX: CARDIOVERSION: EP1203

## 2024-09-21 MED ORDER — SODIUM CHLORIDE 0.9 % IV SOLN: INTRAVENOUS | Status: DC

## 2024-09-21 MED ORDER — PROPOFOL 10 MG/ML IV BOLUS
INTRAVENOUS | Status: DC | PRN
  Administered 2024-09-21: 60 mg via INTRAVENOUS

## 2024-09-21 MED ORDER — LIDOCAINE 2% (20 MG/ML) 5 ML SYRINGE
INTRAMUSCULAR | Status: DC | PRN
  Administered 2024-09-21: 80 mg via INTRAVENOUS

## 2024-09-21 NOTE — CV Procedure (Signed)
 Procedure:   DCCV  Indication:  Symptomatic atrial atypical flutter  Procedure Note:  The patient signed informed consent.  They have had had therapeutic anticoagulation with apixaban  greater than 3 weeks.  Anesthesia was supervised by Dr. Boone.  Patient received 80 mg IV lidocaine  and 60 mg IV propofol .Adequate airway was maintained throughout and vital followed per protocol.  They were cardioverted x 1 with 200J of biphasic synchronized energy.  They converted to NSR.  There were no apparent complications.  The patient had normal neuro status and respiratory status post procedure with vitals stable as recorded elsewhere.    Follow up:  They will continue on current medical therapy and follow up with cardiology as scheduled.  Shelda Bruckner, MD PhD 09/21/2024 9:25 AM

## 2024-09-21 NOTE — Transfer of Care (Signed)
 Immediate Anesthesia Transfer of Care Note  Patient: Frances [name]  Procedure(s) Performed: CARDIOVERSION  Patient Location: Cath Lab  Anesthesia Type:General  Level of Consciousness: drowsy and patient cooperative  Airway & Oxygen Therapy: Patient Spontanous Breathing and Patient connected to nasal cannula oxygen  Post-op Assessment: Report given to RN, Post -op Vital signs reviewed and stable, and Patient moving all extremities X 4  Post vital signs: Reviewed and stable  Last Vitals:  Vitals Value Taken Time  BP 122/73 09/21/24 09:23  Temp    Pulse 65 09/21/24 09:25  Resp 18 09/21/24 09:25  SpO2 91 % 09/21/24 09:25    Last Pain:  Vitals:   09/21/24 0918  TempSrc:   PainSc: 0-No pain         Complications: No notable events documented.

## 2024-09-21 NOTE — Anesthesia Preprocedure Evaluation (Signed)
"                                    Anesthesia Evaluation  Patient identified by MRN, date of birth, ID band Patient awake    Reviewed: Allergy & Precautions, NPO status , Patient's Chart, lab work & pertinent test results  History of Anesthesia Complications Negative for: history of anesthetic complications  Airway Mallampati: I  TM Distance: >3 FB Neck ROM: Full    Dental no notable dental hx. (+) Teeth Intact, Dental Advisory Given   Pulmonary asthma    Pulmonary exam normal breath sounds clear to auscultation       Cardiovascular hypertension, Pt. on medications + CAD and + Peripheral Vascular Disease  Normal cardiovascular exam+ dysrhythmias Atrial Fibrillation + pacemaker  Rhythm:Irregular Rate:Tachycardia  Echo: - Left ventricle: Systolic function was normal. The estimated    ejection fraction was in the range of 55% to 60%. Wall motion was    normal; there were no regional wall motion abnormalities.  - Aortic valve: Trileaflet; mildly thickened, mildly calcified    leaflets.  - Mitral valve: There was mild to moderate regurgitation, with    multiple jets.  - Left atrium: The atrium was moderately dilated. No evidence of    thrombus in the atrial cavity or appendage. There was    moderatecontinuous spontaneous echo contrast (&quot;smoke&quot;) in the    cavity and the appendage. The appendage was morphologically a    left appendage, multilobulated, and of normal size. Emptying    velocity was moderately reduced.  - Right atrium: The atrium was dilated. No evidence of thrombus in    the atrial cavity or appendage.  - Atrial septum: No defect or patent foramen ovale was identified.   Neuro/Psych negative neurological ROS  negative psych ROS   GI/Hepatic Neg liver ROS,GERD  Medicated,,  Endo/Other  Hypothyroidism    Renal/GU CRFRenal disease     Musculoskeletal  (+) Arthritis ,    Abdominal  (+) + obese  Peds  Hematology negative hematology  ROS (+)   Anesthesia Other Findings   Reproductive/Obstetrics                              Anesthesia Physical Anesthesia Plan  ASA: 3  Anesthesia Plan: General   Post-op Pain Management: Minimal or no pain anticipated   Induction: Intravenous  PONV Risk Score and Plan: 3 and Propofol  infusion, TIVA and Ondansetron   Airway Management Planned: Natural Airway and Mask  Additional Equipment: None  Intra-op Plan:   Post-operative Plan:   Informed Consent: I have reviewed the patients History and Physical, chart, labs and discussed the procedure including the risks, benefits and alternatives for the proposed anesthesia with the patient or authorized representative who has indicated his/her understanding and acceptance.     Dental advisory given  Plan Discussed with: CRNA  Anesthesia Plan Comments: (Discussed risks of anesthesia with patient, including possibility of difficulty with spontaneous ventilation under anesthesia necessitating airway intervention, PONV, and rare risks such as cardiac or respiratory or neurological events, and allergic reactions. Discussed the role of CRNA in patient's perioperative care. Patient understands.)         Anesthesia Quick Evaluation  "

## 2024-09-21 NOTE — Interval H&P Note (Signed)
 History and Physical Interval Note:  09/21/2024 9:19 AM  Frances  B [name]  has presented today for surgery, with the diagnosis of AFIB.  The various methods of treatment have been discussed with the patient and family. After consideration of risks, benefits and other options for treatment, the patient has consented to  Procedures: CARDIOVERSION (N/A) as a surgical intervention.  The patient's history has been reviewed, patient examined, no change in status, stable for surgery.  I have reviewed the patient's chart and labs.  Questions were answered to the patient's satisfaction.     Brinden Kincheloe Lonni

## 2024-09-21 NOTE — Anesthesia Postprocedure Evaluation (Signed)
"   Anesthesia Post Note  Patient: Frances [name]  Procedure(s) Performed: CARDIOVERSION     Patient location during evaluation: Cath Lab Anesthesia Type: General Level of consciousness: awake and alert Pain management: pain level controlled Vital Signs Assessment: post-procedure vital signs reviewed and stable Respiratory status: spontaneous breathing, nonlabored ventilation, respiratory function stable and patient connected to nasal cannula oxygen Cardiovascular status: blood pressure returned to baseline and stable Postop Assessment: no apparent nausea or vomiting Anesthetic complications: no   No notable events documented.  Last Vitals:  Vitals:   09/21/24 0950 09/21/24 1000  BP: (!) 149/77 (!) 150/82  Pulse: 72 68  Resp: 20 15  Temp:    SpO2: 92% 95%    Last Pain:  Vitals:   09/21/24 0925  TempSrc:   PainSc: Asleep                 [name]      "

## 2024-09-22 ENCOUNTER — Other Ambulatory Visit: Payer: Self-pay | Admitting: Cardiology

## 2024-09-22 MED ORDER — ROSUVASTATIN CALCIUM 10 MG PO TABS: 10.0000 mg | ORAL_TABLET | Freq: Every day | ORAL | 1 refills | Status: AC

## 2024-10-03 NOTE — Progress Notes (Incomplete)
 "  Primary Care Physician: Dwight Trula SQUIBB, MD Primary Cardiologist: Oneil Parchment, MD Electrophysiologist: Eulas FORBES Furbish, MD  Referring Physician: ***    Frances [name] is a 89 y.o. female with a history of paroxysmal AF, hypothyroidism, CKD, HTN, who presents for {consultation/follow up:29754} in the [facility].  The patient was initially diagnosed with atrial fibrillation in 2017 with ablations completed in 2017 and 2019.  She previously on flecainide  but failed because of breakthrough AF.  She was transition to Tikosyn  with hospitalization on 08/2022 but unfortunately developed QT prolongation during admission and was discontinued prior to discharge.  She was seen in follow-up on 08/24/2024 and noted to be in A-fib.  She had her amiodarone  increased to 200 mg twice daily x 2 weeks and underwent DCCV on 09/21/2024.  patient presents today for follow up for atrial fibrillation. ***   Today, she denies symptoms of ***palpitations, chest pain, shortness of breath, orthopnea, PND, lower extremity edema, dizziness, presyncope, syncope, snoring, daytime somnolence, bleeding, or neurologic sequela. The patient is tolerating medications without difficulties and is otherwise without complaint today.   ***Discussed the use of AI scribe software for clinical note transcription with the patient, who gave verbal consent to proceed.   Notes: Needs repeat TSH due to mild elevation  Atrial Fibrillation Management history: History of Sleep Apnea*** History of alcohol use*** History of early familial atrial fibrillation or other arrhythmias *** Previous antiarrhythmic drugs: Flecainide  failed 07/2022, Tikosyn  failed due to QT prolongation, amiodarone  Previous cardioversions: Multiple since 2017 Previous ablations: 2017 and 2019 Anticoagulation history: Eliquis   ROS- All systems are reviewed and negative except as per the HPI above.  Past Medical History:  Diagnosis  Date   Arthritis    hands, back   Asthmatic bronchitis    Atrial fibrillation (HCC) 07/31/2013   Chronic anticoagulation 07/30/2014   Chronic kidney disease    stage 3 renal disease - no med   Dizziness 07/30/2014   Dysrhythmia    Hx - a-fib 05/2010 - tx with meds, no problem since 05/2010   First degree AV block 07/30/2014   Hearing loss    bilateral hearing aids   Hypertension    controlled with meds   Hypothyroidism    Internal hemorrhoid    PAF (paroxysmal atrial fibrillation) (HCC) 11/28/2015   Paroxysmal atrial fibrillation (HCC)    Peripheral vascular disease    right arm and right shoulder blood clots r/t a fall   Persistent atrial fibrillation (HCC) 09/28/2017   Rectal bleeding    SVD (spontaneous vaginal delivery) 1957, 1959   x 2   Thyroid  disease    hypothyroidism   Past Surgical History:  Procedure Laterality Date   ABDOMINAL HYSTERECTOMY     and right ovary   ANTERIOR AND POSTERIOR REPAIR N/A 01/03/2014   Procedure: ANTERIOR (CYSTOCELE) REPAIR;  Surgeon: Burnard A. Kandyce, MD;  Location: [facility];  Service: Gynecology;  Laterality: N/A;   APPENDECTOMY     ATRIAL FIBRILLATION ABLATION N/A 09/28/2017   Procedure: ATRIAL FIBRILLATION ABLATION;  Surgeon: Kelsie Agent, MD;  Location: [facility] INVASIVE CV LAB;  Service: Cardiovascular;  Laterality: N/A;   BACK SURGERY     x 3 - 2 rods and 8 screws   CARDIOVERSION N/A 08/24/2017   Procedure: CARDIOVERSION;  Surgeon: Parchment Oneil BROCKS, MD;  Location: [facility] ENDOSCOPY;  Service: Cardiovascular;  Laterality: N/A;   CARDIOVERSION N/A 10/19/2017   Procedure: CARDIOVERSION;  Surgeon: Delford Maude BROCKS, MD;  Location: [facility]  ENDOSCOPY;  Service: Cardiovascular;  Laterality: N/A;   CARDIOVERSION N/A 10/04/2019   Procedure: CARDIOVERSION;  Surgeon: Mona Vinie BROCKS, MD;  Location: [facility] ENDOSCOPY;  Service: Cardiovascular;  Laterality: N/A;   CARDIOVERSION N/A 08/20/2021   Procedure: CARDIOVERSION;  Surgeon: Jeffrie Oneil BROCKS, MD;  Location: [facility]  ENDOSCOPY;  Service: Cardiovascular;  Laterality: N/A;   CARDIOVERSION N/A 07/02/2022   Procedure: CARDIOVERSION;  Surgeon: Barbaraann Darryle Ned, MD;  Location: [facility] ENDOSCOPY;  Service: Cardiovascular;  Laterality: N/A;   CARDIOVERSION N/A 10/01/2022   Procedure: CARDIOVERSION;  Surgeon: Jeffrie Oneil BROCKS, MD;  Location: [facility] ENDOSCOPY;  Service: Cardiovascular;  Laterality: N/A;   CARDIOVERSION N/A 09/21/2024   Procedure: CARDIOVERSION;  Surgeon: Lonni Slain, MD;  Location: [facility] INVASIVE CV LAB;  Service: Cardiovascular;  Laterality: N/A;   COLONOSCOPY     CYSTOCELE REPAIR     DENTAL SURGERY     infected tooth - general   DILATION AND CURETTAGE OF UTERUS     x several   ELECTROPHYSIOLOGIC STUDY N/A 11/28/2015   Procedure: Atrial Fibrillation Ablation;  Surgeon: Lynwood Rakers, MD;  Location: [facility] INVASIVE CV LAB;  Service: Cardiovascular;  Laterality: N/A;   EP IMPLANTABLE DEVICE N/A 06/08/2016   Procedure: Loop Recorder Insertion;  Surgeon: Lynwood Rakers, MD;  Location: [facility] INVASIVE CV LAB;  Service: Cardiovascular;  Laterality: N/A;   EXAMINATION UNDER ANESTHESIA N/A 01/15/2014   Procedure: EXAM UNDER ANESTHESIA with evacuation of hematoma and over sewing of vaginal mucosa.;  Surgeon: Burnard LABOR. Kandyce, MD;  Location: [facility];  Service: Gynecology;  Laterality: N/A;   HAND SURGERY     left   hemorrhoid injection  04/2011   implantable loop recorder removal  08/06/2021   MDT Reveal LINQ removed by Dr Rakers   JOINT REPLACEMENT     left thumb replacement   MYRINGOTOMY WITH TUBE PLACEMENT Right 10/27/2018   TEE WITHOUT CARDIOVERSION N/A 09/27/2017   Procedure: TRANSESOPHAGEAL ECHOCARDIOGRAM (TEE);  Surgeon: Shlomo Wilbert SAUNDERS, MD;  Location: [facility] ENDOSCOPY;  Service: Cardiovascular;  Laterality: N/A;   TONSILLECTOMY     Pneumovax 23 [pneumococcal vac polyvalent], Codeine, Cephalexin, Ciprofloxacin, Sudafed [pseudoephedrine hcl], and Sulfa antibiotics Current Outpatient Medications  Medication  Sig Dispense Refill   acetaminophen  (TYLENOL ) 500 MG tablet Take 500-1,000 mg by mouth as needed for moderate pain or headache.     amiodarone  (PACERONE ) 200 MG tablet Take 1 tablet (200 mg total) by mouth 2 (two) times daily for 14 days, THEN 0.5 tablets (100 mg total) daily. Take 0.5 mg daily. 45 tablet 2   Biotin (BIOTIN 5000) 5 MG CAPS Take 5 mg by mouth daily with lunch.     Calcium  Carb-Cholecalciferol (CALCIUM  600 + D PO) Take 1 tablet by mouth daily with lunch. 600 mg / 20 mcg     diclofenac  Sodium (VOLTAREN ) 1 % GEL Apply 1 Application topically as needed (pain).     diltiazem  (CARDIZEM ) 30 MG tablet TAKE 1 TABLET BY MOUTH EVERY 4 HOURS AS NEEDED (FOR FAST HEART RATE > 100 AS LONG AS BP > 100). (Patient not taking: No sig reported) 135 tablet 1   diltiazem  (TIAZAC ) 180 MG 24 hr capsule TAKE 1 CAPSULE BY MOUTH DAILY 90 capsule 3   ELIQUIS  5 MG TABS tablet TAKE 1 TABLET BY MOUTH TWICE A DAY 180 tablet 1   estradiol  (ESTRACE ) 0.1 MG/GM vaginal cream Place 0.5 g vaginally 2 (two) times a week. Place 1g nightly for two weeks then twice a week after (Patient not taking: No  sig reported) 42.5 g 11   fluticasone (FLONASE) 50 MCG/ACT nasal spray Place 1 spray into both nostrils daily as needed for allergies.     levothyroxine  (SYNTHROID , LEVOTHROID) 75 MCG tablet Take 75 mcg by mouth daily before breakfast.     methenamine  (HIPREX ) 1 g tablet Take 1 tablet (1 g total) by mouth 2 (two) times daily with a meal. 60 tablet 2   PARoxetine  (PAXIL ) 20 MG tablet Take 10 mg by mouth every other day.     Probiotic Product (PROBIOTIC PO) Take 1 capsule by mouth daily with lunch.     rosuvastatin  (CRESTOR ) 10 MG tablet Take 1 tablet (10 mg total) by mouth daily. 90 tablet 1   valsartan (DIOVAN) 160 MG tablet Take 160 mg by mouth daily.     vitamin B-12 (CYANOCOBALAMIN) 1000 MCG tablet Take 1,000 mcg by mouth daily with lunch.     Wheat Dextrin (BENEFIBER) POWD Take 1 Scoop by mouth daily.     No current  facility-administered medications for this visit.    Physical Exam: LMP  (LMP Unknown)   GEN: Well nourished, well developed in no acute distress NECK: No JVD; No carotid bruits CARDIAC: {EPRHYTHM:28826}, no murmurs, rubs, gallops RESPIRATORY:  Clear to auscultation without rales, wheezing or rhonchi  ABDOMEN: Soft, non-tender, non-distended EXTREMITIES:  No edema; No deformity   Wt Readings from Last 3 Encounters:  09/21/24 79.4 kg  08/24/24 79.6 kg  02/24/24 79.7 kg    Lab Results  Component Value Date   TSH 5.060 (H) 09/18/2024   EKG today demonstrates:   EKG Interpretation Date/Time:    Ventricular Rate:    PR Interval:    QRS Duration:    QT Interval:    QTC Calculation:   R Axis:      Text Interpretation:          Echo Completed ***  CHA2DS2-VASc Score =    The patient's score is based upon:    {Confirm score is correct.  If not, click here to update score.  REFRESH note.  :1}    ASSESSMENT AND PLAN: {Select the correct AFib Diagnosis                 :[id number]}   Signed,  Wyn Raddle, Jackee Shove, NP    10/03/2024 4:46 PM     {Are you ordering a CV Procedure (e.g. stress test, cath, DCCV, TEE, etc)?   Press F2        :[id number]}  Do not forget to refresh clinic note for EKG  Follow up with the AF Clinic in ***  {Click to Open Review  :1}  "

## 2024-10-04 ENCOUNTER — Ambulatory Visit (HOSPITAL_COMMUNITY): Admitting: Nurse Practitioner

## 2024-10-19 ENCOUNTER — Ambulatory Visit (HOSPITAL_COMMUNITY): Admitting: Physician Assistant

## 2024-10-30 ENCOUNTER — Ambulatory Visit: Admitting: Obstetrics and Gynecology

## 2024-11-02 ENCOUNTER — Ambulatory Visit: Admitting: Obstetrics and Gynecology
